# Patient Record
Sex: Female | Born: 1965 | Race: Black or African American | Hispanic: No | Marital: Single | Smoking: Former smoker
Health system: Southern US, Community
[De-identification: ages and names within clinical notes are randomized; demographics above are authoritative.]

## PROBLEM LIST (undated history)

## (undated) DIAGNOSIS — K859 Acute pancreatitis without necrosis or infection, unspecified: Secondary | ICD-10-CM

## (undated) DIAGNOSIS — I639 Cerebral infarction, unspecified: Secondary | ICD-10-CM

## (undated) DIAGNOSIS — N1831 Chronic kidney disease, stage 3a: Secondary | ICD-10-CM

## (undated) DIAGNOSIS — I10 Essential (primary) hypertension: Secondary | ICD-10-CM

## (undated) HISTORY — PX: TUBAL LIGATION: SHX77

## (undated) HISTORY — PX: CORONARY ANGIOPLASTY WITH STENT PLACEMENT: SHX49

---

## 2020-07-04 ENCOUNTER — Emergency Department (HOSPITAL_COMMUNITY)
Admission: EM | Admit: 2020-07-04 | Discharge: 2020-07-05 | Disposition: A | Discharge status: Home / Self Care | Payer: Medicaid Other | Attending: Emergency Medicine | Admitting: Emergency Medicine

## 2020-07-04 ENCOUNTER — Other Ambulatory Visit: Payer: Self-pay

## 2020-07-04 DIAGNOSIS — Z79899 Other long term (current) drug therapy: Secondary | ICD-10-CM | POA: Insufficient documentation

## 2020-07-04 DIAGNOSIS — U071 COVID-19: Secondary | ICD-10-CM | POA: Insufficient documentation

## 2020-07-04 DIAGNOSIS — Z8673 Personal history of transient ischemic attack (TIA), and cerebral infarction without residual deficits: Secondary | ICD-10-CM | POA: Insufficient documentation

## 2020-07-04 DIAGNOSIS — R531 Weakness: Secondary | ICD-10-CM

## 2020-07-04 DIAGNOSIS — I2510 Atherosclerotic heart disease of native coronary artery without angina pectoris: Secondary | ICD-10-CM | POA: Diagnosis not present

## 2020-07-04 DIAGNOSIS — Z7982 Long term (current) use of aspirin: Secondary | ICD-10-CM | POA: Diagnosis not present

## 2020-07-04 DIAGNOSIS — I10 Essential (primary) hypertension: Secondary | ICD-10-CM | POA: Diagnosis not present

## 2020-07-04 LAB — BASIC METABOLIC PANEL
Anion gap: 17 — ABNORMAL HIGH (ref 5–15)
BUN: 33 mg/dL — ABNORMAL HIGH (ref 6–20)
CO2: 27 mmol/L (ref 22–32)
Calcium: 9.4 mg/dL (ref 8.9–10.3)
Chloride: 93 mmol/L — ABNORMAL LOW (ref 98–111)
Creatinine, Ser: 1.59 mg/dL — ABNORMAL HIGH (ref 0.44–1.00)
GFR, Estimated: 38 mL/min — ABNORMAL LOW (ref >60)
Glucose, Bld: 91 mg/dL (ref 70–99)
Potassium: 3.5 mmol/L (ref 3.5–5.1)
Sodium: 137 mmol/L (ref 135–145)

## 2020-07-04 LAB — CBC
HCT: 45.4 % (ref 36.0–46.0)
Hemoglobin: 15.2 g/dL — ABNORMAL HIGH (ref 12.0–15.0)
MCH: 29.6 pg (ref 26.0–34.0)
MCHC: 33.5 g/dL (ref 30.0–36.0)
MCV: 88.3 fL (ref 80.0–100.0)
Platelets: 370 10*3/uL (ref 150–400)
RBC: 5.14 MIL/uL — ABNORMAL HIGH (ref 3.87–5.11)
RDW: 13 % (ref 11.5–15.5)
WBC: 5.7 10*3/uL (ref 4.0–10.5)
nRBC: 0 % (ref 0.0–0.2)

## 2020-07-04 NOTE — ED Triage Notes (Signed)
Pt said she had a stroke In november and she had been going through therapy and the past 2 weeks she has not been able to get therapy and now she has been weaker on that left side and more stiff. She said she was walking with her walker but now since no therapy stiff again. Able to move limbs and hold them up on left side

## 2020-07-05 ENCOUNTER — Emergency Department (HOSPITAL_COMMUNITY): Payer: Medicaid Other

## 2020-07-05 LAB — SARS CORONAVIRUS 2 (TAT 6-24 HRS): SARS Coronavirus 2: POSITIVE — AB

## 2020-07-05 MED ORDER — SODIUM CHLORIDE 0.9 % IV BOLUS
1000.0000 mL | Freq: Once | INTRAVENOUS | Status: AC
  Administered 2020-07-05: 1000 mL via INTRAVENOUS

## 2020-07-05 MED ORDER — SODIUM CHLORIDE 0.9 % IV BOLUS
500.0000 mL | Freq: Once | INTRAVENOUS | Status: AC
  Administered 2020-07-05: 500 mL via INTRAVENOUS

## 2020-07-05 NOTE — Discharge Instructions (Signed)
Your COVID test came back positive today. This is most likely causing your fatigue and weakness.   Make sure you are staying hydrated and drinking fluids.   You can take Tylenol or Ibuprofen as directed for pain. You can alternate Tylenol and Ibuprofen every 4 hours. If you take Tylenol at 1pm, then you can take Ibuprofen at 5pm. Then you can take Tylenol again at 9pm.   Return to the Emergency Dept for any difficulty breathing, vomiting/inability to keep any food or liquid downs, chest pain or any other worsening or concerning symptoms.

## 2020-07-05 NOTE — ED Notes (Signed)
Patient transported to MRI 

## 2020-07-05 NOTE — ED Notes (Signed)
Pt placed on bedpan

## 2020-07-05 NOTE — Progress Notes (Signed)
CSW called Pt via phone due to Covid + status.  Pt states that she had a stroke in 2020 but was able to walk with a walker as recently as last week but has missed 2 weeks of PT as she was visiting niece in [place] and it snowed, therefore she states that she was unable to return to [facility] for PT at [facility] where she also has a neurologist and PCP.  Per Care Everywhere Pt has several PT appointments set at [facility] as well as other care appointments.  Pt was able to reach out to sister who will allow Pt to stay with her at her apartment while she quarantines for C+ and then Pt will return to [facility] for ongoing PT.   07/05/20 1906  TOC ED Mini Assessment  TOC Time spent with patient (minutes): 90  PING Used in TOC Assessment Yes  Admission or Readmission Diverted Yes  Interventions which prevented an admission or readmission Transportation Screening  What brought you to the Emergency Department?  Stiffness  Barriers to Discharge No Barriers Identified  Barrier interventions Transportation was coordinated through [employer] of departure Other (enter comment) Agricultural engineer)

## 2020-07-05 NOTE — ED Provider Notes (Signed)
[facility] EMERGENCY DEPARTMENT Provider Note   CSN: [id number] Arrival date & time: 07/04/20  1912     History Chief Complaint  Patient presents with  . Weakness  . Fatigue    Shirley Hansen is a 55 y.o. female.  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  CAD, HTN, HLD.  Patient states that she is presented to the emergency department today for weakness that is worsened over the past 2 weeks.  She states that weakness is affecting left upper and lower extremity.  She states that she has also feeling globally weak and fatigued.  She states that she has been staying with her niece here in [place] she normally lives with family in [place] states that she has not been able to do her normal physical therapy and feels more weak and stiff than usual.  She has had regular neurology follow-up with [facility] neurology.  She has received Botox injections for her muscle stiffness in her shoulder.  She denies any new chest pain--she has chronic muscle tightness in her chest and left shoulder--denies any shortness of breath.  She denies any headache.  She denies any new cough states that she has been coughing for years.  She denies any fevers at home no chills nausea vomiting or diarrhea.     No past medical history on file.  There are no problems to display for this patient.   The histories are not reviewed yet. Please review them in the "History" navigator section and refresh this SmartLink.   OB History   No obstetric history on file.     No family history on file.     Home Medications Prior to Admission medications   Medication Sig Start Date End Date Taking? Authorizing Provider  Artificial Tear Solution (GENTEAL TEARS OP) Place 1 drop into both eyes daily as needed (dry eyes).   Yes [provider]  aspirin EC 81 MG  tablet Take 81 mg by mouth daily. Swallow whole.   Yes [provider]  atorvastatin (LIPITOR) 40 MG tablet Take 40 mg by mouth at bedtime. 06/17/20  Yes [provider]  brimonidine (ALPHAGAN) 0.2 % ophthalmic solution Place 1 drop into both eyes 2 (two) times daily. 06/26/20  Yes [provider]  diclofenac Sodium (VOLTAREN) 1 % GEL Apply 1 application topically 4 (four) times daily as needed (pain). 05/20/20  Yes [provider]  dorzolamide-timolol (COSOPT) 22.3-6.8 MG/ML ophthalmic solution Place 1 drop into both eyes 2 (two) times daily. 05/31/20  Yes [provider]  gabapentin (NEURONTIN) 300 MG capsule Take 600 mg by mouth at bedtime. 06/11/20  Yes [provider]  hydrochlorothiazide (HYDRODIURIL) 25 MG tablet Take 25 mg by mouth daily. 06/11/20  Yes [provider]  latanoprost (XALATAN) 0.005 % ophthalmic solution Place 1 drop into both eyes at bedtime. 06/17/20  Yes [provider]  lisinopril (ZESTRIL) 40 MG tablet Take 40 mg by mouth daily. 06/16/20  Yes [provider]  methocarbamol (ROBAXIN) 500 MG tablet Take 500 mg by mouth 3 (three) times daily. 05/20/20  Yes [provider]  potassium chloride SA (KLOR-CON) 20 MEQ tablet Take 20 mEq by mouth daily. 06/26/20  Yes [provider]    Allergies    Patient has no known allergies.  Review of Systems   Review of Systems  Constitutional: Positive for activity change, appetite  change and fatigue. Negative for chills and fever.  HENT: Negative for congestion.   Eyes: Negative for pain.  Respiratory: Positive for cough (chronic, somewhat worse). Negative for shortness of breath.   Cardiovascular: Positive for chest pain (chronic unchanged). Negative for leg swelling.  Gastrointestinal: Negative for abdominal pain, diarrhea, nausea and vomiting.  Genitourinary: Negative for dysuria.  Musculoskeletal: Negative for myalgias.  Skin: Negative for  rash.  Neurological: Positive for weakness. Negative for dizziness and headaches.    Physical Exam Updated Vital Signs BP (!) 120/100   Pulse (!) 109   Temp 98.4 F (36.9 C) (Oral)   Resp 16   SpO2 99%   Physical Exam Vitals and nursing note reviewed.  Constitutional:      General: She is not in acute distress.    Appearance: She is obese.     Comments: Obese chronically ill-appearing but in no acute distress 55 year old female appears older than stated age. She is laying in bed watching TV.  HENT:     Head: Normocephalic and atraumatic.     Nose: Nose normal.     Mouth/Throat:     Mouth: Mucous membranes are dry.  Eyes:     General: No scleral icterus. Cardiovascular:     Rate and Rhythm: Normal rate and regular rhythm.     Pulses: Normal pulses.     Heart sounds: Normal heart sounds.  Pulmonary:     Effort: Pulmonary effort is normal. No respiratory distress.     Breath sounds: No wheezing.  Abdominal:     Palpations: Abdomen is soft.     Tenderness: There is no abdominal tenderness. There is no guarding or rebound.  Musculoskeletal:     Cervical back: Normal range of motion.     Right lower leg: No edema.     Left lower leg: No edema.     Comments: See neuro exam for strength examination. Diffuse muscular tenderness of the left shoulder and trapezius and parathoracic musculature.  Moves all 4 extremities.  Skin:    General: Skin is warm and dry.     Capillary Refill: Capillary refill takes less than 2 seconds.  Neurological:     Mental Status: She is alert. Mental status is at baseline.     Comments: 4/5 strength in left upper and lower extremity.  Specifically with elbow flexion extension, good strength, knee flexion extension hip flexion extension.   Alert and oriented to self, place, time and event.   Speech is fluent, clear without dysarthria or dysphasia.   Strength 5/5 in upper/lower extremities  Sensation intact in upper/lower extremities    Difficulty with finger-to-nose in left upper extremity given weakness however is able to touch nose without difficulty and then extend arm and then touch and is again. Finger-to-nose normal right upper extremity.  CN I not tested  CN II grossly intact visual fields bilaterally. Did not visualize posterior eye.   CN III, IV, VI PERRLA and EOMs intact bilaterally  CN V Intact sensation to sharp and light touch to the face  CN VII facial movements symmetric  CN VIII not tested  CN IX, X no uvula deviation, symmetric rise of soft palate  CN XI 5/5 SCM and trapezius strength bilaterally  CN XII Midline tongue protrusion, symmetric L/R movements    Psychiatric:        Mood and Affect: Mood normal.        Behavior: Behavior normal.     ED Results / Procedures /  Treatments   Labs (all labs ordered are listed, but only abnormal results are displayed) Labs Reviewed  BASIC METABOLIC PANEL - Abnormal; Notable for the following components:      Result Value   Chloride 93 (*)    BUN 33 (*)    Creatinine, Ser 1.59 (*)    GFR, Estimated 38 (*)    Anion gap 17 (*)    All other components within normal limits  CBC - Abnormal; Notable for the following components:   RBC 5.14 (*)    Hemoglobin 15.2 (*)    All other components within normal limits  SARS CORONAVIRUS 2 (TAT 6-24 HRS)  URINALYSIS, ROUTINE W REFLEX MICROSCOPIC  CBG MONITORING, ED    EKG EKG Interpretation  Date/Time:  Sunday July 04 2020 19:21:25 EST Ventricular Rate:  134 PR Interval:  116 QRS Duration: 118 QT Interval:  324 QTC Calculation: 483 R Axis:   -64 Text Interpretation: Sinus tachycardia Left axis deviation Right bundle branch block Abnormal ECG No old tracing to compare Confirmed by Meridee ScoreButler, Michael [phone]([id number]) on 07/05/2020 9:55:31 AM   Radiology DG Chest 2 View  Result Date: 07/05/2020 CLINICAL DATA:  Shortness of breath and fatigue EXAM: CHEST - 2 VIEW COMPARISON:  None. FINDINGS: Indistinct densities  at the lung bases on the frontal view. No edema, effusion, or pneumothorax. Normal heart size and mediastinal contours. IMPRESSION: Mild infiltrate or atelectasis at the lung bases, lateral view and low volumes favoring atelectasis. Electronically Signed   By: Marnee SpringJonathon  Watts M.D.   On: 07/05/2020 10:26   CT Head Wo Contrast  Result Date: 07/05/2020 CLINICAL DATA:  Worsening left-sided weakness (history of CVA) also with history of nontraumatic ICH. EXAM: CT HEAD WITHOUT CONTRAST TECHNIQUE: Contiguous axial images were obtained from the base of the skull through the vertex without intravenous contrast. COMPARISON:  No pertinent prior exams available for comparison. FINDINGS: Brain: Cerebral volume is normal for age. There is a subcentimeter hypodensity within the right thalamocapsular junction with subtle peripheral curvilinear hyperdensity (for instance as seen on series 3, images 15-17). There are chronic lacunar infarcts within the left corona radiata and left pons. No demarcated cortical infarct. No extra-axial fluid collection. No evidence of intracranial mass. No midline shift. Vascular: No hyperdense vessel.  Atherosclerotic calcifications Skull: Normal. Negative for fracture or focal lesion. Sinuses/Orbits: Visualized orbits show no acute finding. No significant paranasal sinus disease at the imaged levels. These results were called by telephone at the time of interpretation on 07/05/2020 at 11:42 am to provider [facility] Natane Heward , who verbally acknowledged these results. IMPRESSION: Subcentimeter hypodensity within the right thalamocapsular junction with subtle peripheral curvilinear hyperdensity. This is favored to reflect subacute/chronic blood products at site of a subacute/chronic lacunar infarct. However, correlation with the patient's history and any available prior outside imaging is necessary. If this does not correlate with the patient's history and outside imaging, a contrast-enhanced brain MRI is  recommended for further evaluation. Chronic lacunar infarcts within the left corona radiata and left basal ganglia. Electronically Signed   By: Jackey LogeKyle  Golden DO   On: 07/05/2020 11:43    Procedures Procedures (including critical care time)  Medications Ordered in ED Medications  sodium chloride 0.9 % bolus 500 mL (has no administration in time range)  sodium chloride 0.9 % bolus 1,000 mL (1,000 mLs Intravenous New Bag/Given 07/05/20 1056)    ED Course  I have reviewed the triage vital signs and the nursing notes.  Pertinent labs & imaging results that  were available during my care of the patient were reviewed by me and considered in my medical decision making (see chart for details).  Patient is a 55 year old female chronically ill has a history of right-sided thalamic intracranial hemorrhage. [facility]. She has chronic issues with left-sided spastic paralysis. She has had Botox injections for this and also physical therapy. She has not had either of these therapies over the past several weeks as she has been stranded in [place] living with her niece.  She appears stably chronically ill today. Lab work reviewed below. Physical exam is notable for left upper and lower extremity weakness. Per documentation from [facility] this does not appear to be significantly different from how she is weak today. She gets around in wheelchair at baseline.  CT head appears consistent with described pathology from her right thalamic ICH. Discussed this read with radiology and subsequently neurology. Subcentimeter hypodensity within the right thalamocapsular junction  with subtle peripheral curvilinear hyperdensity. This is favored to  reflect subacute/chronic blood products at site of a  subacute/chronic lacunar infarct. However, correlation with the  patient's history and any available prior outside imaging is  necessary. If this does not correlate with the patient's  history and  outside imaging, a contrast-enhanced brain MRI is recommended for  further evaluation.    Chronic lacunar infarcts within the left corona radiata and left  basal ganglia.   Will obtain MRI  CBC with elevated hemoglobin consistent with dehydration mild elevation in BUN and creatinine consistent with dehydration. No other significant electrolyte derangements besides very mild hyperchloremia  Clinical Course as of 07/05/20 1522  Mon Jul 05, 2020  1004 Reviewed basic lab work from 1/13 patient's creatinine is 1.1 at the time [WF]  [id number] 55 year old female history of hemorrhagic stroke with residual left-sided weakness usually follows over in [place].  Has not had physical therapy for a few weeks and is complaining of increased stiffness on her left side difficulty with transfers, feeling generally weak and ill.  Feels dehydrated.  Labs showing elevated creatinine.  Getting chest x-ray CT head urinalysis IV fluids Covid testing.  Sounds like she has a poor living situation, staying with family.  May need TOC assistance [MB]  1353 Discussed with neurology. Recommended MRI brain. Will be reasonable for discharge if MRI negative for ACUTE stroke. [WF]  1403 Reassessed patient. She understands plan to MRI. States she understands a urine sample is needed.  She will try to produce a urine sample for us.  Additional 500 mL normal saline bag provided.  Patient is now hooked up to purewick.  [WF]    Clinical Course User Index [MB] Terrilee FilesButler, Michael C, MD [WF] Gailen ShelterFondaw, Julieth Tugman S, [place]   MDM Rules/Calculators/A&P                          Some concern for UTI causing recrudescence of old intracranial hemorrhage neuro deficits. Chest x-ray without abnormality apart from what appears to be atalectasis.   Patient care signed out to oncoming PA at 3:23 PM for follow-up on MRI and urinalysis. Social work will assess patient at bedside. They will help identify barriers to discharge I expect patient will  be able to be discharged today.   Final Clinical Impression(s) / ED Diagnoses Final diagnoses:  None    Rx / DC Orders ED Discharge Orders    None       Rahima Fleishman, Rodrigo RanWylder S, [place]  07/05/20 1523    Terrilee Files, MD 07/05/20 [phone]

## 2020-07-05 NOTE — ED Notes (Signed)
Pt returned from MRI °

## 2020-07-05 NOTE — ED Provider Notes (Signed)
Care assumed from [facility], PA-C at shift change with social consult pending.   In brief, this patient is a 55 year old female with past history of CAD, hypertension, hyperlipidemia, CVA, intracranial hemorrhage with residual left-sided deficits who presents for evaluation of generalized weakness.  She states that she has had fatigue, weakness that has been ongoing.  She does get PT at [facility] and follows with neurology.  She has had 1 Covid vaccine.  She has not noted any fevers at home, denies any abdominal pain, nausea/vomiting, dysuria. Please see note from previous providers.   Physical Exam  BP 108/86 (BP Location: Left Arm)   Pulse (!) 112   Temp 98.5 F (36.9 C) (Oral)   Resp (!) 23   SpO2 96%   Physical Exam   Well appearing Abdomen is soft, non-distended, non-tender. No rigidity, No guarding. No peritoneal signs.  ED Course/Procedures   Clinical Course as of 07/05/20 2057  Mon Jul 05, 2020  1004 Reviewed basic lab work from 1/13 patient's creatinine is 1.1 at the time [WF]  421 55 year old female history of hemorrhagic stroke with residual left-sided weakness usually follows over in [place].  Has not had physical therapy for a few weeks and is complaining of increased stiffness on her left side difficulty with transfers, feeling generally weak and ill.  Feels dehydrated.  Labs showing elevated creatinine.  Getting chest x-ray CT head urinalysis IV fluids Covid testing.  Sounds like she has a poor living situation, staying with family.  May need TOC assistance [MB]  1353 Discussed with neurology. Recommended MRI brain. Will be reasonable for discharge if MRI negative for ACUTE stroke. [WF]  1403 Reassessed patient. She understands plan to MRI. States she understands a urine sample is needed.  She will try to produce a urine sample for [name].  Additional 500 mL normal saline bag provided.  Patient is now hooked up to purewick.  [WF]    Clinical Course User Index [MB] Shirley Files, MD [WF] Shirley Hansen, [place]    Procedures  MDM   PLAN: Follow up on MRI. If normal, social work has been consulted for dispo.   MDM:  MRI shows No acute intracranial hemorrhage or infarct. She she as remote insults involving the left corona radiata and pons.   Patient is Covid positive which is likely contributing to her symptoms.  At this time, she is not hypoxic and does not require any oxygen.  No indication that she needs admission at this time.  Reevaluation.  Patient resting comfortably.  No signs of distress.  Denies any dysuria, hematuria.  She denies any abdominal pain.  I discussed with social work who was consulted on the patient.  They will plan for patient to be transferred to her sister's house in [place]. Patient is agreeable to this. At this time, patient exhibits no emergent life-threatening condition that require further evaluation in ED. Patient had ample opportunity for questions and discussion. All patient's questions were answered with full understanding. Strict return precautions discussed. Patient expresses understanding and agreement to plan.    1. Generalized weakness   2. COVID-19      Shirley Hansen was evaluated in Emergency Department on 07/05/2020 for the symptoms described in the history of present illness. She was evaluated in the context of the global COVID-19 pandemic, which necessitated consideration that the patient might be at risk for infection with the SARS-CoV-2 virus that causes COVID-19. Institutional protocols and algorithms that pertain to the evaluation of  patients at risk for COVID-19 are in a state of rapid change based on information released by regulatory bodies including the CDC and federal and state organizations. These policies and algorithms were followed during the patient's care in the ED.    Shirley Caul, PA-C 07/05/20 2057    Shirley Loll, MD 07/06/20 [phone]

## 2020-07-14 ENCOUNTER — Emergency Department (HOSPITAL_COMMUNITY): Payer: Medicaid Other

## 2020-07-14 ENCOUNTER — Inpatient Hospital Stay (HOSPITAL_COMMUNITY): Payer: Medicaid Other

## 2020-07-14 ENCOUNTER — Other Ambulatory Visit: Payer: Self-pay

## 2020-07-14 ENCOUNTER — Other Ambulatory Visit (HOSPITAL_COMMUNITY): Payer: Medicaid Other

## 2020-07-14 ENCOUNTER — Encounter (HOSPITAL_COMMUNITY): Payer: Self-pay | Admitting: *Deleted

## 2020-07-14 ENCOUNTER — Inpatient Hospital Stay (HOSPITAL_COMMUNITY)
Admission: EM | Admit: 2020-07-14 | Discharge: 2020-07-28 | DRG: 178 | Disposition: A | Discharge status: Skilled Nursing Facility | Payer: Medicaid Other | Attending: Internal Medicine | Admitting: Internal Medicine

## 2020-07-14 DIAGNOSIS — I129 Hypertensive chronic kidney disease with stage 1 through stage 4 chronic kidney disease, or unspecified chronic kidney disease: Secondary | ICD-10-CM | POA: Diagnosis present

## 2020-07-14 DIAGNOSIS — Z23 Encounter for immunization: Secondary | ICD-10-CM | POA: Diagnosis not present

## 2020-07-14 DIAGNOSIS — D72829 Elevated white blood cell count, unspecified: Secondary | ICD-10-CM | POA: Diagnosis not present

## 2020-07-14 DIAGNOSIS — I69354 Hemiplegia and hemiparesis following cerebral infarction affecting left non-dominant side: Secondary | ICD-10-CM

## 2020-07-14 DIAGNOSIS — I252 Old myocardial infarction: Secondary | ICD-10-CM

## 2020-07-14 DIAGNOSIS — N1831 Chronic kidney disease, stage 3a: Secondary | ICD-10-CM | POA: Diagnosis present

## 2020-07-14 DIAGNOSIS — I2510 Atherosclerotic heart disease of native coronary artery without angina pectoris: Secondary | ICD-10-CM | POA: Diagnosis present

## 2020-07-14 DIAGNOSIS — E669 Obesity, unspecified: Secondary | ICD-10-CM | POA: Diagnosis present

## 2020-07-14 DIAGNOSIS — N189 Chronic kidney disease, unspecified: Secondary | ICD-10-CM | POA: Diagnosis not present

## 2020-07-14 DIAGNOSIS — E860 Dehydration: Secondary | ICD-10-CM | POA: Diagnosis present

## 2020-07-14 DIAGNOSIS — R609 Edema, unspecified: Secondary | ICD-10-CM | POA: Diagnosis not present

## 2020-07-14 DIAGNOSIS — I639 Cerebral infarction, unspecified: Secondary | ICD-10-CM | POA: Diagnosis present

## 2020-07-14 DIAGNOSIS — R7401 Elevation of levels of liver transaminase levels: Secondary | ICD-10-CM | POA: Diagnosis present

## 2020-07-14 DIAGNOSIS — M109 Gout, unspecified: Secondary | ICD-10-CM | POA: Diagnosis present

## 2020-07-14 DIAGNOSIS — N179 Acute kidney failure, unspecified: Secondary | ICD-10-CM | POA: Diagnosis present

## 2020-07-14 DIAGNOSIS — R0602 Shortness of breath: Secondary | ICD-10-CM

## 2020-07-14 DIAGNOSIS — U071 COVID-19: Secondary | ICD-10-CM | POA: Diagnosis not present

## 2020-07-14 DIAGNOSIS — I959 Hypotension, unspecified: Secondary | ICD-10-CM | POA: Diagnosis present

## 2020-07-14 DIAGNOSIS — Z87891 Personal history of nicotine dependence: Secondary | ICD-10-CM | POA: Diagnosis not present

## 2020-07-14 DIAGNOSIS — E785 Hyperlipidemia, unspecified: Secondary | ICD-10-CM | POA: Diagnosis present

## 2020-07-14 DIAGNOSIS — T380X5A Adverse effect of glucocorticoids and synthetic analogues, initial encounter: Secondary | ICD-10-CM | POA: Diagnosis not present

## 2020-07-14 DIAGNOSIS — Z8673 Personal history of transient ischemic attack (TIA), and cerebral infarction without residual deficits: Secondary | ICD-10-CM | POA: Diagnosis not present

## 2020-07-14 DIAGNOSIS — R21 Rash and other nonspecific skin eruption: Secondary | ICD-10-CM | POA: Diagnosis not present

## 2020-07-14 DIAGNOSIS — I10 Essential (primary) hypertension: Secondary | ICD-10-CM | POA: Diagnosis present

## 2020-07-14 DIAGNOSIS — Z6833 Body mass index (BMI) 33.0-33.9, adult: Secondary | ICD-10-CM

## 2020-07-14 DIAGNOSIS — M79673 Pain in unspecified foot: Secondary | ICD-10-CM

## 2020-07-14 DIAGNOSIS — L89029 Pressure ulcer of left elbow, unspecified stage: Secondary | ICD-10-CM | POA: Diagnosis present

## 2020-07-14 DIAGNOSIS — R7989 Other specified abnormal findings of blood chemistry: Secondary | ICD-10-CM | POA: Diagnosis present

## 2020-07-14 DIAGNOSIS — M79671 Pain in right foot: Secondary | ICD-10-CM | POA: Diagnosis not present

## 2020-07-14 DIAGNOSIS — N390 Urinary tract infection, site not specified: Secondary | ICD-10-CM | POA: Diagnosis present

## 2020-07-14 DIAGNOSIS — M79672 Pain in left foot: Secondary | ICD-10-CM | POA: Diagnosis not present

## 2020-07-14 HISTORY — DX: Acute pancreatitis without necrosis or infection, unspecified: K85.90

## 2020-07-14 HISTORY — DX: Essential (primary) hypertension: I10

## 2020-07-14 HISTORY — DX: Chronic kidney disease, stage 3a: N18.31

## 2020-07-14 HISTORY — DX: Cerebral infarction, unspecified: I63.9

## 2020-07-14 LAB — URINALYSIS, ROUTINE W REFLEX MICROSCOPIC
Glucose, UA: NEGATIVE mg/dL
Hgb urine dipstick: NEGATIVE
Ketones, ur: NEGATIVE mg/dL
Nitrite: NEGATIVE
Protein, ur: 30 mg/dL — AB
Specific Gravity, Urine: 1.023 (ref 1.005–1.030)
pH: 5 (ref 5.0–8.0)

## 2020-07-14 LAB — CBC
HCT: 43.6 % (ref 36.0–46.0)
Hemoglobin: 13.9 g/dL (ref 12.0–15.0)
MCH: 28.5 pg (ref 26.0–34.0)
MCHC: 31.9 g/dL (ref 30.0–36.0)
MCV: 89.5 fL (ref 80.0–100.0)
Platelets: 533 10*3/uL — ABNORMAL HIGH (ref 150–400)
RBC: 4.87 MIL/uL (ref 3.87–5.11)
RDW: 13 % (ref 11.5–15.5)
WBC: 11.1 10*3/uL — ABNORMAL HIGH (ref 4.0–10.5)
nRBC: 0 % (ref 0.0–0.2)

## 2020-07-14 LAB — FERRITIN: Ferritin: 643 ng/mL — ABNORMAL HIGH (ref 11–307)

## 2020-07-14 LAB — BASIC METABOLIC PANEL
Anion gap: 16 — ABNORMAL HIGH (ref 5–15)
BUN: 60 mg/dL — ABNORMAL HIGH (ref 6–20)
CO2: 26 mmol/L (ref 22–32)
Calcium: 9.9 mg/dL (ref 8.9–10.3)
Chloride: 94 mmol/L — ABNORMAL LOW (ref 98–111)
Creatinine, Ser: 4.8 mg/dL — ABNORMAL HIGH (ref 0.44–1.00)
GFR, Estimated: 10 mL/min — ABNORMAL LOW (ref >60)
Glucose, Bld: 118 mg/dL — ABNORMAL HIGH (ref 70–99)
Potassium: 3.8 mmol/L (ref 3.5–5.1)
Sodium: 136 mmol/L (ref 135–145)

## 2020-07-14 LAB — HEPARIN LEVEL (UNFRACTIONATED): Heparin Unfractionated: 0.82 IU/mL — ABNORMAL HIGH (ref 0.30–0.70)

## 2020-07-14 LAB — D-DIMER, QUANTITATIVE: D-Dimer, Quant: 8.92 ug/mL-FEU — ABNORMAL HIGH (ref 0.00–0.50)

## 2020-07-14 LAB — LACTIC ACID, PLASMA: Lactic Acid, Venous: 1.4 mmol/L (ref 0.5–1.9)

## 2020-07-14 LAB — I-STAT BETA HCG BLOOD, ED (MC, WL, AP ONLY): I-stat hCG, quantitative: 17.9 m[IU]/mL — ABNORMAL HIGH (ref <5)

## 2020-07-14 LAB — POC OCCULT BLOOD, ED: Fecal Occult Bld: NEGATIVE

## 2020-07-14 LAB — HIV ANTIBODY (ROUTINE TESTING W REFLEX): HIV Screen 4th Generation wRfx: NONREACTIVE

## 2020-07-14 LAB — PROCALCITONIN: Procalcitonin: 0.18 ng/mL

## 2020-07-14 LAB — C-REACTIVE PROTEIN: CRP: 4.1 mg/dL — ABNORMAL HIGH (ref <1.0)

## 2020-07-14 LAB — FIBRINOGEN: Fibrinogen: 452 mg/dL (ref 210–475)

## 2020-07-14 MED ORDER — OXYCODONE HCL 5 MG PO TABS
5.0000 mg | ORAL_TABLET | ORAL | Status: DC | PRN
  Administered 2020-07-14 – 2020-07-19 (×12): 5 mg via ORAL
  Filled 2020-07-14 (×12): qty 1

## 2020-07-14 MED ORDER — BRIMONIDINE TARTRATE 0.2 % OP SOLN
1.0000 [drp] | Freq: Two times a day (BID) | OPHTHALMIC | Status: DC
  Administered 2020-07-14 – 2020-07-28 (×29): 1 [drp] via OPHTHALMIC
  Filled 2020-07-14 (×2): qty 5

## 2020-07-14 MED ORDER — CAMPHOR-MENTHOL 0.5-0.5 % EX LOTN
1.0000 "application " | TOPICAL_LOTION | Freq: Three times a day (TID) | CUTANEOUS | Status: DC | PRN
  Filled 2020-07-14: qty 222

## 2020-07-14 MED ORDER — DORZOLAMIDE HCL-TIMOLOL MAL 2-0.5 % OP SOLN
1.0000 [drp] | Freq: Two times a day (BID) | OPHTHALMIC | Status: DC
  Administered 2020-07-14 – 2020-07-28 (×29): 1 [drp] via OPHTHALMIC
  Filled 2020-07-14 (×2): qty 10

## 2020-07-14 MED ORDER — ONDANSETRON HCL 4 MG/2ML IJ SOLN: 4.0000 mg | Freq: Four times a day (QID) | INTRAMUSCULAR | Status: DC | PRN

## 2020-07-14 MED ORDER — HYDROXYZINE HCL 25 MG PO TABS
25.0000 mg | ORAL_TABLET | Freq: Three times a day (TID) | ORAL | Status: DC | PRN
  Administered 2020-07-16: 25 mg via ORAL
  Filled 2020-07-14: qty 1

## 2020-07-14 MED ORDER — SODIUM CHLORIDE 0.9 % IV BOLUS
1000.0000 mL | Freq: Once | INTRAVENOUS | Status: AC
  Administered 2020-07-14: 1000 mL via INTRAVENOUS

## 2020-07-14 MED ORDER — ZOLPIDEM TARTRATE 5 MG PO TABS
5.0000 mg | ORAL_TABLET | Freq: Every evening | ORAL | Status: DC | PRN
  Administered 2020-07-18 – 2020-07-27 (×10): 5 mg via ORAL
  Filled 2020-07-14 (×10): qty 1

## 2020-07-14 MED ORDER — SODIUM CHLORIDE 0.9% FLUSH
3.0000 mL | Freq: Two times a day (BID) | INTRAVENOUS | Status: DC
  Administered 2020-07-14 – 2020-07-15 (×2): 3 mL via INTRAVENOUS

## 2020-07-14 MED ORDER — ASPIRIN EC 81 MG PO TBEC
81.0000 mg | DELAYED_RELEASE_TABLET | Freq: Every day | ORAL | Status: DC
  Administered 2020-07-14 – 2020-07-28 (×15): 81 mg via ORAL
  Filled 2020-07-14 (×15): qty 1

## 2020-07-14 MED ORDER — SODIUM CHLORIDE 0.9 % IV BOLUS
500.0000 mL | Freq: Once | INTRAVENOUS | Status: AC
  Administered 2020-07-14: 500 mL via INTRAVENOUS

## 2020-07-14 MED ORDER — SODIUM CHLORIDE 0.9 % IV SOLN: 100.0000 mg | Freq: Every day | INTRAVENOUS | Status: DC

## 2020-07-14 MED ORDER — ZINC SULFATE 220 (50 ZN) MG PO CAPS
220.0000 mg | ORAL_CAPSULE | Freq: Every day | ORAL | Status: DC
  Administered 2020-07-14 – 2020-07-28 (×15): 220 mg via ORAL
  Filled 2020-07-14 (×15): qty 1

## 2020-07-14 MED ORDER — HEPARIN (PORCINE) 25000 UT/250ML-% IV SOLN
750.0000 [IU]/h | INTRAVENOUS | Status: DC
  Administered 2020-07-14: 1000 [IU]/h via INTRAVENOUS
  Filled 2020-07-14 (×5): qty 250

## 2020-07-14 MED ORDER — POLYETHYLENE GLYCOL 3350 17 G PO PACK
17.0000 g | PACK | Freq: Every day | ORAL | Status: DC | PRN
  Administered 2020-07-19: 17 g via ORAL
  Filled 2020-07-14: qty 1

## 2020-07-14 MED ORDER — DIAZEPAM 5 MG/ML IJ SOLN: 5.0000 mg | Freq: Once | INTRAMUSCULAR | Status: DC

## 2020-07-14 MED ORDER — HYDROCOD POLST-CPM POLST ER 10-8 MG/5ML PO SUER: 5.0000 mL | Freq: Two times a day (BID) | ORAL | Status: DC | PRN

## 2020-07-14 MED ORDER — SODIUM CHLORIDE 0.9 % IV SOLN
100.0000 mg | Freq: Every day | INTRAVENOUS | Status: AC
  Administered 2020-07-15 – 2020-07-18 (×4): 100 mg via INTRAVENOUS
  Filled 2020-07-14 (×5): qty 20

## 2020-07-14 MED ORDER — HEPARIN BOLUS VIA INFUSION
4000.0000 [IU] | Freq: Once | INTRAVENOUS | Status: AC
  Administered 2020-07-14: 4000 [IU] via INTRAVENOUS
  Filled 2020-07-14: qty 4000

## 2020-07-14 MED ORDER — SODIUM CHLORIDE 0.9 % IV SOLN: Freq: Once | INTRAVENOUS | Status: AC

## 2020-07-14 MED ORDER — PREDNISONE 5 MG PO TABS: 50.0000 mg | ORAL_TABLET | Freq: Every day | ORAL | Status: DC

## 2020-07-14 MED ORDER — METHYLPREDNISOLONE SODIUM SUCC 40 MG IJ SOLR
0.5000 mg/kg | Freq: Two times a day (BID) | INTRAMUSCULAR | Status: DC
Start: 2020-07-14 — End: 2020-07-15
  Administered 2020-07-14 – 2020-07-15 (×2): 39.6 mg via INTRAVENOUS
  Filled 2020-07-14 (×2): qty 1

## 2020-07-14 MED ORDER — ONDANSETRON HCL 4 MG PO TABS
4.0000 mg | ORAL_TABLET | Freq: Four times a day (QID) | ORAL | Status: DC | PRN
Start: 2020-07-14 — End: 2020-07-15

## 2020-07-14 MED ORDER — ACETAMINOPHEN 325 MG PO TABS
650.0000 mg | ORAL_TABLET | Freq: Four times a day (QID) | ORAL | Status: DC | PRN
  Administered 2020-07-14 – 2020-07-28 (×19): 650 mg via ORAL
  Filled 2020-07-14 (×20): qty 2

## 2020-07-14 MED ORDER — CALCIUM CARBONATE ANTACID 1250 MG/5ML PO SUSP
500.0000 mg | Freq: Four times a day (QID) | ORAL | Status: DC | PRN
  Filled 2020-07-14: qty 5

## 2020-07-14 MED ORDER — SODIUM CHLORIDE 0.9 % IV SOLN: 250.0000 mL | INTRAVENOUS | Status: DC | PRN

## 2020-07-14 MED ORDER — ALBUTEROL SULFATE HFA 108 (90 BASE) MCG/ACT IN AERS
2.0000 | INHALATION_SPRAY | RESPIRATORY_TRACT | Status: DC | PRN
  Filled 2020-07-14: qty 6.7

## 2020-07-14 MED ORDER — METHOCARBAMOL 500 MG PO TABS
750.0000 mg | ORAL_TABLET | Freq: Once | ORAL | Status: AC
  Administered 2020-07-14: 750 mg via ORAL
  Filled 2020-07-14: qty 2

## 2020-07-14 MED ORDER — LACTATED RINGERS IV SOLN: INTRAVENOUS | Status: DC

## 2020-07-14 MED ORDER — NEPRO/CARBSTEADY PO LIQD
237.0000 mL | Freq: Three times a day (TID) | ORAL | Status: DC | PRN
  Filled 2020-07-14: qty 237

## 2020-07-14 MED ORDER — DOCUSATE SODIUM 283 MG RE ENEM
1.0000 | ENEMA | RECTAL | Status: DC | PRN
  Filled 2020-07-14: qty 1

## 2020-07-14 MED ORDER — GUAIFENESIN-DM 100-10 MG/5ML PO SYRP: 10.0000 mL | ORAL_SOLUTION | ORAL | Status: DC | PRN

## 2020-07-14 MED ORDER — SODIUM CHLORIDE 0.9 % IV SOLN: 200.0000 mg | Freq: Once | INTRAVENOUS | Status: DC

## 2020-07-14 MED ORDER — LATANOPROST 0.005 % OP SOLN
1.0000 [drp] | Freq: Every day | OPHTHALMIC | Status: DC
  Administered 2020-07-14 – 2020-07-27 (×14): 1 [drp] via OPHTHALMIC
  Filled 2020-07-14 (×2): qty 2.5

## 2020-07-14 MED ORDER — ASCORBIC ACID 500 MG PO TABS
500.0000 mg | ORAL_TABLET | Freq: Every day | ORAL | Status: DC
  Administered 2020-07-14 – 2020-07-28 (×15): 500 mg via ORAL
  Filled 2020-07-14 (×15): qty 1

## 2020-07-14 MED ORDER — ACETAMINOPHEN 650 MG RE SUPP: 650.0000 mg | Freq: Four times a day (QID) | RECTAL | Status: DC | PRN

## 2020-07-14 MED ORDER — SODIUM CHLORIDE 0.9% FLUSH: 3.0000 mL | INTRAVENOUS | Status: DC | PRN

## 2020-07-14 MED ORDER — SORBITOL 70 % SOLN
30.0000 mL | Status: DC | PRN
  Filled 2020-07-14: qty 30

## 2020-07-14 MED ORDER — ATORVASTATIN CALCIUM 40 MG PO TABS
40.0000 mg | ORAL_TABLET | Freq: Every day | ORAL | Status: DC
  Administered 2020-07-14 – 2020-07-27 (×14): 40 mg via ORAL
  Filled 2020-07-14 (×14): qty 1

## 2020-07-14 MED ORDER — BISACODYL 5 MG PO TBEC: 5.0000 mg | DELAYED_RELEASE_TABLET | Freq: Every day | ORAL | Status: DC | PRN

## 2020-07-14 MED ORDER — SODIUM CHLORIDE 0.9 % IV SOLN
200.0000 mg | Freq: Once | INTRAVENOUS | Status: AC
  Administered 2020-07-14: 200 mg via INTRAVENOUS
  Filled 2020-07-14: qty 200

## 2020-07-14 NOTE — Progress Notes (Addendum)
ANTICOAGULATION CONSULT NOTE - Follow Up Consult  Pharmacy Consult for heparin Indication: rule out pulmonary embolus in setting of COVID infection and elevated d-dimer  No Known Allergies  Patient Measurements: Height: 5\' 2"  (157.5 cm) Weight: 79.4 kg (175 lb) IBW/kg (Calculated) : 50.1 Heparin Dosing Weight: 67.7  Vital Signs: Temp: 97.8 F (36.6 C) (02/02 1144) Temp Source: Oral (02/02 1144) BP: 91/71 (02/02 1345) Pulse Rate: 116 (02/02 1345)  Labs: Recent Labs    07/14/20 0701  HGB 13.9  HCT 43.6  PLT 533*  CREATININE 4.80*    Estimated Creatinine Clearance: 13.1 mL/min (A) (by C-G formula based on SCr of 4.8 mg/dL (H)).   Assessment: 55 yo admitted with weakness, blood pressure 80-90's on arrival.  Previous COVID infection diagnosed 07/05/20.  CXR unremarkable.  D-dimer elevated at 8.92 and VQ scan pending.  Patient does have elevated creatinine (today 4.8 while previously 1.53 in January 2022). Pharmacy consulted to dose heparin empirically while ruling out PE due to patient being high risk with recent COVID infection and elevated d-dimer.    Goal of Therapy:  Heparin level 0.3-0.7 units/ml Monitor platelets by anticoagulation protocol: Yes   Plan:  Give 4000 units bolus x 1, followed by Heparin 1000 units/hr Check 8 hour HL due to CrCl < 12mL/min Follow up daily CBC, s/sx bleeding, VQ scan results  31m, PharmD PGY-1 Acute Care Pharmacy Resident Office: [phone] 07/14/2020 2:01 PM

## 2020-07-14 NOTE — ED Provider Notes (Signed)
[facility]   CSN: [id number] Arrival date & time: 07/14/20  0636     History Chief Complaint  Patient presents with  . Back Pain  . Weakness    Covid     Shirley AdamDetra Dillenburg is a 55 y.o. female.  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  CAD, HTN, HLD.  Notably I saw patient 10 days ago in ER and diagnosed patient with Covid at that time.  She had had gradual weakening and decompensation over the past 2 weeks 53 generalized weakness related to patient not being compliant with her physical therapy or Botox injections via her neurologist at [facility]--this is because patient has been stranded in [place] for social reasons.  Patient finally complaining of left shoulder spasms.  She states that these have been worse for 1 week however on further questioning she states that this is been ongoing for greater than 3 weeks.  She describes a constant severe.  States that she is having difficulty sleeping because of the pain/contractions in her arm/shoulder.  She denies any fevers, chills, cough, urinary frequency, urgency,.  No hematuria.  No diarrhea. He does however state that "I am irritated down there" referencing the area between her legs given that she states that she has been sitting in her urine and her diaper because of poor help at home.  She states that she has not been drinking much water because she feels it taste change.      Past Medical History:  Diagnosis Date  . Hypertension   . Pancreatitis   . Renal disorder   . Stroke [facility](HCC)     Patient Active Problem List   Diagnosis Date Noted  . COVID-19 07/14/2020     The histories are not reviewed yet. Please review them in the "History" navigator section and refresh this SmartLink.   OB History   No obstetric history on file.     No family  history on file.     Home Medications Prior to Admission medications   Medication Sig Start Date End Date Taking? Authorizing Provider  Artificial Tear Solution (GENTEAL TEARS OP) Place 1 drop into both eyes daily as needed (dry eyes).   Yes [provider]  aspirin EC 81 MG tablet Take 81 mg by mouth daily. Swallow whole.   Yes [provider]  atorvastatin (LIPITOR) 40 MG tablet Take 40 mg by mouth at bedtime. 06/17/20  Yes [provider]  brimonidine (ALPHAGAN) 0.2 % ophthalmic solution Place 1 drop into both eyes 2 (two) times daily. 06/26/20  Yes [provider]  diclofenac Sodium (VOLTAREN) 1 % GEL Apply 1 application topically 4 (four) times daily as needed (pain). 05/20/20  Yes [provider]  dorzolamide-timolol (COSOPT) 22.3-6.8 MG/ML ophthalmic solution Place 1 drop into both eyes 2 (two) times daily. 05/31/20  Yes [provider]  gabapentin (NEURONTIN) 300 MG capsule Take 600 mg by mouth at bedtime. 06/11/20  Yes [provider]  hydrochlorothiazide (HYDRODIURIL) 25 MG tablet Take 25 mg by mouth daily. 06/11/20  Yes [provider]  latanoprost (XALATAN) 0.005 % ophthalmic solution Place 1 drop into both eyes at bedtime. 06/17/20  Yes [provider]  lisinopril (ZESTRIL) 40 MG tablet Take 40 mg by mouth daily. 06/16/20  Yes [provider]  methocarbamol (ROBAXIN) 500 MG tablet Take 500 mg by mouth  3 (three) times daily. 05/20/20  Yes [provider]  potassium chloride SA (KLOR-CON) 20 MEQ tablet Take 20 mEq by mouth daily. 06/26/20  Yes [provider]    Allergies    Patient has no known allergies.  Review of Systems   Review of Systems  Constitutional: Positive for fatigue. Negative for chills and fever.  HENT: Negative for congestion.   Eyes: Negative for pain.  Respiratory: Negative for cough and shortness of breath.   Cardiovascular: Negative for chest pain and leg  swelling.  Gastrointestinal: Negative for abdominal pain and vomiting.  Genitourinary: Negative for dysuria.  Musculoskeletal: Negative for myalgias.       Left shoulder and neck pain  Skin: Negative for rash.  Neurological: Negative for dizziness and headaches.    Physical Exam Updated Vital Signs BP 114/88 (BP Location: Right Arm)   Pulse (!) 121   Temp 98.8 F (37.1 C) (Oral)   Resp 15   Ht 5\' 2"  (1.575 m)   Wt 79.4 kg   SpO2 96%   BMI 32.01 kg/m   Physical Exam Vitals and nursing note reviewed.  Constitutional:      General: She is not in acute distress.    Appearance: She is obese.  HENT:     Head: Normocephalic and atraumatic.     Nose: Nose normal.     Mouth/Throat:     Mouth: Mucous membranes are dry.  Eyes:     General: No scleral icterus. Cardiovascular:     Rate and Rhythm: Normal rate and regular rhythm.     Pulses: Normal pulses.     Heart sounds: Normal heart sounds.  Pulmonary:     Effort: Pulmonary effort is normal. No respiratory distress.     Breath sounds: Normal breath sounds. No wheezing or rales.  Abdominal:     Palpations: Abdomen is soft.     Tenderness: There is no abdominal tenderness. There is no right CVA tenderness, left CVA tenderness, guarding or rebound.  Musculoskeletal:     Cervical back: Normal range of motion.     Right lower leg: No edema.     Left lower leg: No edema.     Comments: Some left trapezius and deltoid TTP Consistent left upper and left lower extremity weakness 4/5  Skin:    General: Skin is warm and dry.     Capillary Refill: Capillary refill takes less than 2 seconds.  Neurological:     Mental Status: She is alert. Mental status is at baseline.  Psychiatric:        Mood and Affect: Mood normal.        Behavior: Behavior normal.     ED Results / Procedures / Treatments   Labs (all labs ordered are listed, but only abnormal results are displayed) Labs Reviewed  BASIC METABOLIC PANEL - Abnormal; Notable  for the following components:      Result Value   Chloride 94 (*)    Glucose, Bld 118 (*)    BUN 60 (*)    Creatinine, Ser 4.80 (*)    GFR, Estimated 10 (*)    Anion gap 16 (*)    All other components within normal limits  CBC - Abnormal; Notable for the following components:   WBC 11.1 (*)    Platelets 533 (*)    All other components within normal limits  URINALYSIS, ROUTINE W REFLEX MICROSCOPIC - Abnormal; Notable for the following components:   Color, Urine AMBER (*)  APPearance CLOUDY (*)    Bilirubin Urine SMALL (*)    Protein, ur 30 (*)    Leukocytes,Ua SMALL (*)    Bacteria, UA FEW (*)    All other components within normal limits  D-DIMER, QUANTITATIVE (NOT AT [facility]) - Abnormal; Notable for the following components:   D-Dimer, Quant 8.92 (*)    All other components within normal limits  FERRITIN - Abnormal; Notable for the following components:   Ferritin 643 (*)    All other components within normal limits  C-REACTIVE PROTEIN - Abnormal; Notable for the following components:   CRP 4.1 (*)    All other components within normal limits  I-STAT BETA HCG BLOOD, ED ([facility], [facility], [facility] ONLY) - Abnormal; Notable for the following components:   I-stat hCG, quantitative 17.9 (*)    All other components within normal limits  CULTURE, BLOOD (ROUTINE X 2)  CULTURE, BLOOD (ROUTINE X 2)  URINE CULTURE  LACTIC ACID, PLASMA  FIBRINOGEN  PROCALCITONIN  HEPARIN LEVEL (UNFRACTIONATED)  HIV ANTIBODY (ROUTINE TESTING W REFLEX)  CBC WITH DIFFERENTIAL/PLATELET  COMPREHENSIVE METABOLIC PANEL  C-REACTIVE PROTEIN  D-DIMER, QUANTITATIVE (NOT AT [facility])  FERRITIN  MAGNESIUM  PHOSPHORUS  POC OCCULT BLOOD, ED    EKG EKG Interpretation  Date/Time:  Wednesday July 14 2020 06:52:10 EST Ventricular Rate:  115 PR Interval:  118 QRS Duration: 122 QT Interval:  354 QTC Calculation: 489 R Axis:   -10 Text Interpretation: Sinus tachycardia Right bundle branch block Abnormal ECG Confirmed by  Virgina Norfolk [phone]) on 07/14/2020 10:08:50 AM   Radiology DG Chest Portable 1 View  Result Date: 07/14/2020 CLINICAL DATA:  Dyspnea, COVID pneumonia EXAM: PORTABLE CHEST 1 VIEW COMPARISON:  07/05/2020 FINDINGS: The heart size and mediastinal contours are within normal limits. Both lungs are clear. The visualized skeletal structures are unremarkable. IMPRESSION: No active disease. Electronically Signed   By: Helyn Numbers MD   On: 07/14/2020 10:43    Procedures .Critical Care Performed by: Gailen Shelter, PA Authorized by: Gailen Shelter, PA   Critical care provider statement:    Critical care time (minutes):  45   Critical care time was exclusive of:  Separately billable procedures and treating other patients and teaching time   Critical care was necessary to treat or prevent imminent or life-threatening deterioration of the following conditions: Persistent borderline hypotension.   Critical care was time spent personally by me on the following activities:  Discussions with consultants, evaluation of patient's response to treatment, examination of patient, ordering and performing treatments and interventions, ordering and review of laboratory studies, ordering and review of radiographic studies, pulse oximetry, re-evaluation of patient's condition, obtaining history from patient or surrogate and review of old charts   I assumed direction of critical care for this patient from another provider in my specialty: no      Medications Ordered in ED Medications  heparin bolus via infusion 4,000 Units (has no administration in time range)  heparin ADULT infusion 100 units/mL (25000 units/220mL) (has no administration in time range)  diazepam (VALIUM) injection 5 mg (5 mg Intravenous Not Given 07/14/20 1654)  aspirin EC tablet 81 mg (81 mg Oral Given 07/14/20 1606)  atorvastatin (LIPITOR) tablet 40 mg (has no administration in time range)  brimonidine (ALPHAGAN) 0.2 % ophthalmic solution 1 drop  (1 drop Both Eyes Not Given 07/14/20 1654)  dorzolamide-timolol (COSOPT) 22.3-6.8 MG/ML ophthalmic solution 1 drop (has no administration in time range)  latanoprost (XALATAN) 0.005 % ophthalmic solution 1  drop (has no administration in time range)  acetaminophen (TYLENOL) tablet 650 mg (has no administration in time range)    Or  acetaminophen (TYLENOL) suppository 650 mg (has no administration in time range)  zolpidem (AMBIEN) tablet 5 mg (has no administration in time range)  sorbitol 70 % solution 30 mL (has no administration in time range)  docusate sodium (ENEMEEZ) enema 283 mg (has no administration in time range)  ondansetron (ZOFRAN) tablet 4 mg (has no administration in time range)    Or  ondansetron (ZOFRAN) injection 4 mg (has no administration in time range)  camphor-menthol (SARNA) lotion 1 application (has no administration in time range)    And  hydrOXYzine (ATARAX/VISTARIL) tablet 25 mg (has no administration in time range)  calcium carbonate (dosed in mg elemental calcium) suspension 500 mg of elemental calcium (has no administration in time range)  feeding supplement (NEPRO CARB STEADY) liquid 237 mL (has no administration in time range)  sodium chloride flush (NS) 0.9 % injection 3 mL (3 mLs Intravenous Given 07/14/20 1655)  sodium chloride flush (NS) 0.9 % injection 3 mL (3 mLs Intravenous Given 07/14/20 1655)  sodium chloride flush (NS) 0.9 % injection 3 mL (has no administration in time range)  0.9 %  sodium chloride infusion (has no administration in time range)  albuterol (VENTOLIN HFA) 108 (90 Base) MCG/ACT inhaler 2 puff (has no administration in time range)  methylPREDNISolone sodium succinate (SOLU-MEDROL) 40 mg/mL injection 39.6 mg (39.6 mg Intravenous Given 07/14/20 1606)    Followed by  predniSONE (DELTASONE) tablet 50 mg (has no administration in time range)  guaiFENesin-dextromethorphan (ROBITUSSIN DM) 100-10 MG/5ML syrup 10 mL (has no administration in time range)   chlorpheniramine-HYDROcodone (TUSSIONEX) 10-8 MG/5ML suspension 5 mL (has no administration in time range)  ascorbic acid (VITAMIN C) tablet 500 mg (500 mg Oral Given 07/14/20 1606)  zinc sulfate capsule 220 mg (220 mg Oral Given 07/14/20 1655)  oxyCODONE (Oxy IR/ROXICODONE) immediate release tablet 5 mg (has no administration in time range)  polyethylene glycol (MIRALAX / GLYCOLAX) packet 17 g (has no administration in time range)  bisacodyl (DULCOLAX) EC tablet 5 mg (has no administration in time range)  lactated ringers infusion ( Intravenous New Bag/Given 07/14/20 1623)  remdesivir 200 mg in sodium chloride 0.9% 250 mL IVPB (200 mg Intravenous New Bag/Given 07/14/20 1709)    Followed by  remdesivir 100 mg in sodium chloride 0.9 % 100 mL IVPB (has no administration in time range)  sodium chloride 0.9 % bolus 1,000 mL (0 mLs Intravenous Stopped 07/14/20 1219)  methocarbamol (ROBAXIN) tablet 750 mg (750 mg Oral Given 07/14/20 1147)  sodium chloride 0.9 % bolus 1,000 mL (0 mLs Intravenous Stopped 07/14/20 1318)  sodium chloride 0.9 % bolus 500 mL (0 mLs Intravenous Stopped 07/14/20 1357)  0.9 %  sodium chloride infusion ( Intravenous New Bag/Given 07/14/20 1319)  sodium chloride 0.9 % bolus 500 mL (0 mLs Intravenous Stopped 07/14/20 1447)    ED Course  I have reviewed the triage vital signs and the nursing notes.  Pertinent labs & imaging results that were available during my care of the patient were reviewed by me and considered in my medical decision making (see chart for details).    MDM Rules/Calculators/A&P                          Patient is 55 year old female Covid positive approximately 10 days ago presented today with fatigue, continued left neck,  shoulder, back pain which is been episodic/more or less constant since she had a stroke several years ago.  She has residual left-sided deficits.  Physical exam is notable for diffuse muscular tenderness at that area.  She is on my examination  tachycardic and with soft blood pressures.  She seems to be between 80 and 90 systolic relatively consistently.  Her blood pressures are similar on both arms.  Discussed this case with my attending physician who assessed patient at bedside.  We will provide patient with IV fluid. She has had improvement in her blood pressure.  She is mentating well however she does have some drowsiness now after provide her with Robaxin for muscle pain.  CBC with WBC of 11 with low suspicion for significant infection.  Platelets mildly elevated this may be due to dehydration.  BMP with significant worsening kidney function.  See timeline below.  Creat 1.2 (04/09/20) >> 1.1 (06/24/20) >> 1.59 (07/04/20) >> 4.80   Urinalysis with few bacteria and small leukocytes She is not having any urinary symptoms other than because she has been collecting few weeks she has been "sitting in my urine "  Inflammatory markers are patient's COVID-19 obtained.  They are elevated.  Patient takes daily aspirin and lisinopril.  Blood pressure today somewhat soft, patient somewhat tachycardic, appears very dehydrated.  He is afebrile denies any new cough any dysuria, frequency urgency.    Chest xray - negative.   EKG - tachycardic  Urine negative for infection but w/ hyaline casts present.  Will admit for AKI and borderline hypotension. Dr. Lockie Mola discussed with hospitalist Dr. Ophelia Charter.  Shirley Hansen was evaluated in Emergency Department on 07/14/2020 for the symptoms described in the history of present illness. She was evaluated in the context of the global COVID-19 pandemic, which necessitated consideration that the patient might be at risk for infection with the SARS-CoV-2 virus that causes COVID-19. Institutional protocols and algorithms that pertain to the evaluation of patients at risk for COVID-19 are in a state of rapid change based on information released by regulatory bodies including the CDC and federal and state organizations.  These policies and algorithms were followed during the patient's care in the ED.  Final Clinical Impression(s) / ED Diagnoses Final diagnoses:  COVID-19  AKI (acute kidney injury) [facility])    Rx / DC Orders ED Discharge Orders    None       Gailen Shelter, [place] 07/14/20 1837    Virgina Norfolk, DO 07/15/20 1828

## 2020-07-14 NOTE — ED Provider Notes (Addendum)
Will I personally evaluated the patient during the encounter and completed a history, physical, procedures, medical decision making to contribute to the overall care of the patient and decision making for the patient briefly, the patient is a 55 y.o. female with history of stroke, hypertension who presents the ED with generalized weakness. Patient with blood pressure in the low 90s upper 80s upon arrival. Tested positive for Covid about 2 weeks ago. Has not had much to eat or drink during that time due to not having much of an appetite and not liking how things taste. She has had some cramps throughout. She has left-sided weakness from prior stroke and gets cramps often in her upper back. She has overall clear breath sounds. Does not have any specific shortness of breath or chest pain. Her mucous membranes appear dry. Chest x-ray is unremarkable. Patient has a creatinine of 4.8 which is up from 2 weeks ago when she was here with the creatinine was in the 1 range. Has a white count of 11.1 but normal lactic acid. Does not have a fever. Have a lower suspicion for septic process and overall suspect volume depletion and dehydration. She states her blood pressures normally in the low 100s. Will pursue infectious work-up will hold antibiotics at this time as believe that this is more dehydration related. Hemoglobin went from 15.1-13.9. She has not had any bowel movements and denies any black or bloody stools. We will do a rectal exam. Anticipate admission to medicine will order 2 L of IV fluids. Blood pressure has been more consistent in the low 90s since IV fluids has been started.  Reevaluated. Blood pressure tenuous between the 80s and 90s systolic. Bedside echocardiogram was very limited due to body habitus cannot get great windows of the IVC. What I saw the heart showed no pericardial effusion or obvious right heart strain. Hyperdynamic activity. Bladder scan still despite 2 L of fluid showed no urine and still  believe she is under resuscitated. An additional fluid bolus to be ordered. No concern for septic process at this time and suspect that this is still secondary to severe dehydration. Talked with the hospitalist service who will admit the patient but would like critical care involved. We will hold off on any vasopressors at this time. We will start empiric heparin treatment as PE is still on the differential. Therefore will start anticoagulation and patient will likely get a VQ scan at some point.   This chart was dictated using voice recognition software.  Despite best efforts to proofread,  errors can occur which can change the documentation meaning.     EKG Interpretation  Date/Time:  Wednesday July 14 2020 06:52:10 EST Ventricular Rate:  115 PR Interval:  118 QRS Duration: 122 QT Interval:  354 QTC Calculation: 489 R Axis:   -10 Text Interpretation: Sinus tachycardia Right bundle branch block Abnormal ECG Confirmed by Virgina Norfolk [phone]) on 07/14/2020 10:08:50 AM      .Critical Care Performed by: Virgina Norfolk, DO Authorized by: Virgina Norfolk, DO   Critical care provider statement:    Critical care time (minutes):  45   Critical care was necessary to treat or prevent imminent or life-threatening deterioration of the following conditions:  Renal failure and shock   Critical care was time spent personally by me on the following activities:  Blood draw for specimens, development of treatment plan with patient or surrogate, discussions with consultants, evaluation of patient's response to treatment, discussions with primary provider, examination of patient,  obtaining history from patient or surrogate, ordering and performing treatments and interventions, ordering and review of laboratory studies, ordering and review of radiographic studies, pulse oximetry, re-evaluation of patient's condition and review of old charts   I assumed direction of critical care for this patient from  another provider in my specialty: no           Virgina Norfolk, DO 07/14/20 1229    Virgina Norfolk, DO 07/14/20 1358

## 2020-07-14 NOTE — ED Triage Notes (Signed)
Pt reporting that she is having a spasm like pain in the upper left back, and when she has pain here she also has a pain in the far upper left chest area. Reports this pain has been going on for over a week. Hx of stroke, has weakness on the left side. Says she has COVID, has loss her sense of taste and not eating well, and feels weak, says she is unable to walk because of her weakness.

## 2020-07-14 NOTE — Progress Notes (Signed)
ANTICOAGULATION CONSULT NOTE  Pharmacy Consult for heparin Indication: rule out pulmonary embolus in setting of COVID infection and elevated d-dimer  No Known Allergies  PLabs: Recent Labs    07/14/20 0701 07/14/20 2200  HGB 13.9  --   HCT 43.6  --   PLT 533*  --   HEPARINUNFRC  --  0.82*  CREATININE 4.80*  --     Assessment: 55 yo admitted with weakness, blood pressure 80-90's on arrival.  Previous COVID infection diagnosed 07/05/20.  CXR unremarkable.  D-dimer elevated at 8.92 and VQ scan pending.  Patient does have elevated creatinine (today 4.8 while previously 1.53 in January 2022). Pharmacy consulted to dose heparin empirically while ruling out PE due to patient being high risk with recent COVID infection and elevated d-dimer.   Initial heparin level 0.82 units/ml.  Level was drawn ~4hours after bolus  Goal of Therapy:  Heparin level 0.3-0.7 units/ml Monitor platelets by anticoagulation protocol: Yes   Plan:  Decrease heparin 900 units/hr Follow up daily CBC,heparin level, s/sx bleeding, VQ scan results  Thanks for allowing pharmacy to be a part of this patient's care.  Talbert Cage, PharmD Clinical Pharmacist  07/14/2020 11:35 PM

## 2020-07-14 NOTE — Consult Note (Signed)
NAME:  Shirley Hansen, MRN:  [id number], DOB:  10/20/1965, LOS: 0 ADMISSION DATE:  07/14/2020, CONSULTATION DATE:  07/14/20 REFERRING MD:  Lockie Mola, CHIEF COMPLAINT:  N/V   Brief History   54 year w/ hx of CKD, HTN, prior stroke presenting with N/V, hypotension in setting of COVID infection.  History of present illness   80 year w/ hx of CKD, HTN, prior stroke presenting with N/V, hypotension in setting of COVID infection.  No diarrhea.  Not able to keep food down.  Having subjective fevers/chills.  No dyspnea or cough.  Mostly c/o acute on chronic back spasms.  Past Medical History  CKD (Cr 1.1 at [facility] on 06/24/20, 1.59 on 07/04/20) HTN Prior subcortical R hemispheric hemorrhage Hx kidney stones HLD CAD Tobacco abuse  Significant Hospital Events   n/a  Consults:  PCCM  Procedures:  none  Significant Diagnostic Tests:  CRP 4.1 Lactate 1.4 Ferritin 643  Micro Data:  COVID + ~ 2 weeks ago per report  Antimicrobials:     Interim history/subjective:  Consulted  Objective   Blood pressure (!) 80/64, pulse (!) 117, temperature 97.8 F (36.6 C), temperature source Oral, resp. rate 17, height 5\' 2"  (1.575 m), weight 79.4 kg, SpO2 100 %.        Intake/Output Summary (Last 24 hours) at 07/14/2020 1423 Last data filed at 07/14/2020 1357 Gross per 24 hour  Intake 2500 ml  Output --  Net 2500 ml   Filed Weights   07/14/20 1116  Weight: 79.4 kg    Examination: Constitutional: middle aged woman in no acute distress Eyes: eyes are anicteric, reactive to light Ears, nose, mouth, and throat: mucous membranes moist, trachea midline Cardiovascular: heart sounds are regular, tachycardic, ext are warm to touch. no edema Respiratory: clear, no wheezing Gastrointestinal: abdomen is soft with + BS Skin: No rashes, normal turgor Neurologic: moves all 4 ext to command Psychiatric: anxious  UA slightly positive BUN/Cr 33/1.59 >> 60/4.8  Lactate benign Ferritin 643 CRP 4.1 D dimer  +  Resolved Hospital Problem list   n/a  Assessment & Plan:  Acute on chronic renal injury in setting of poor PO, N/V, home ACEi use - Agree with generous IVF, consider LR to reduce acid load - Trend, avoid nephrotoxins, check renal 06-16-2000  ?Cystitits- UA borderline, defer abx to primary, Pct pending  Relative hypotension, tachycardia- lactate benign, some of her tachycardia driven by chronic spasms from prior stroke so would tx this.  SBP normally run in low 100s for her - Dose of valium given in ER (cannot due robaxin 2/2 aki)  COVID+ - Per primary, no pulmonary symptoms  Elevated D dimer- f/u VQ scan and LE duplex, if both neg can prob just do heparin dvt ppx  Best practice:  Diet: npo, advance as tolerated if nausea under control Pain/Anxiety/Delirium protocol (if indicated): valium x 1 given, remainder per primary VAP protocol (if indicated): n/a DVT prophylaxis: heparin gtt pending above studies GI prophylaxis: n/a Glucose control: n/a Mobility: BR Code Status: full Family Communication: per primary Disposition: should be stable for progressive, discussed with Dr. [name], we are available should her Bps tank further despite IVF  Labs   CBC: Recent Labs  Lab 07/14/20 0701  WBC 11.1*  HGB 13.9  HCT 43.6  MCV 89.5  PLT 533*    Basic Metabolic Panel: Recent Labs  Lab 07/14/20 0701  NA 136  K 3.8  CL 94*  CO2 26  GLUCOSE 118*  BUN 60*  CREATININE 4.80*  CALCIUM 9.9   GFR: Estimated Creatinine Clearance: 13.1 mL/min (A) (by C-G formula based on SCr of 4.8 mg/dL (H)). Recent Labs  Lab 07/14/20 0701 07/14/20 1119  WBC 11.1*  --   LATICACIDVEN  --  1.4    Liver Function Tests: No results for input(s): AST, ALT, ALKPHOS, BILITOT, PROT, ALBUMIN in the last 168 hours. No results for input(s): LIPASE, AMYLASE in the last 168 hours. No results for input(s): AMMONIA in the last 168 hours.  ABG No results found for: PHART, PCO2ART, PO2ART, HCO3, TCO2,  ACIDBASEDEF, O2SAT   Coagulation Profile: No results for input(s): INR, PROTIME in the last 168 hours.  Cardiac Enzymes: No results for input(s): CKTOTAL, CKMB, CKMBINDEX, TROPONINI in the last 168 hours.  HbA1C: No results found for: HGBA1C  CBG: No results for input(s): GLUCAP in the last 168 hours.  Review of Systems:    Positive Symptoms in bold:  Constitutional fevers, chills, weight loss, fatigue, anorexia, malaise  Eyes decreased vision, double vision, eye irritation  Ears, Nose, Mouth, Throat sore throat, trouble swallowing, sinus congestion  Cardiovascular chest pain, paroxysmal nocturnal dyspnea, lower ext edema, palpitations   Respiratory SOB, cough, DOE, hemoptysis, wheezing  Gastrointestinal nausea, vomiting, diarrhea  Genitourinary burning with urination, trouble urinating  Musculoskeletal joint aches, joint swelling, back pain  Integumentary  rashes, skin lesions  Neurological focal weakness, focal numbness, trouble speaking, headaches  Psychiatric depression, anxiety, confusion  Endocrine polyuria, polydipsia, cold intolerance, heat intolerance  Hematologic abnormal bruising, abnormal bleeding, unexplained nose bleeds  Allergic/Immunologic recurrent infections, hives, swollen lymph nodes    Past Medical History   Coronary artery disease MI 2017  History of stroke  Hyperlipidemia with target LDL less than 100 04/25/2019  Hypertension  ICH (intracerebral hemorrhage) (CMS-HCC) 04/14/2019  Kidney stone  Pancreatitis      Social History    Smoker  Family History   No family history of COVID  Allergies No Known Allergies   Home Medications  Prior to Admission medications   Medication Sig Start Date End Date Taking? Authorizing Provider  Artificial Tear Solution (GENTEAL TEARS OP) Place 1 drop into both eyes daily as needed (dry eyes).   Yes [provider]  aspirin EC 81 MG tablet Take 81 mg by mouth daily. Swallow whole.   Yes [provider]  atorvastatin (LIPITOR) 40 MG tablet Take 40 mg by mouth at bedtime. 06/17/20  Yes [provider]  brimonidine (ALPHAGAN) 0.2 % ophthalmic solution Place 1 drop into both eyes 2 (two) times daily. 06/26/20  Yes [provider]  diclofenac Sodium (VOLTAREN) 1 % GEL Apply 1 application topically 4 (four) times daily as needed (pain). 05/20/20  Yes [provider]  dorzolamide-timolol (COSOPT) 22.3-6.8 MG/ML ophthalmic solution Place 1 drop into both eyes 2 (two) times daily. 05/31/20  Yes [provider]  gabapentin (NEURONTIN) 300 MG capsule Take 600 mg by mouth at bedtime. 06/11/20  Yes [provider]  hydrochlorothiazide (HYDRODIURIL) 25 MG tablet Take 25 mg by mouth daily. 06/11/20  Yes [provider]  latanoprost (XALATAN) 0.005 % ophthalmic solution Place 1 drop into both eyes at bedtime. 06/17/20  Yes [provider]  lisinopril (ZESTRIL) 40 MG tablet Take 40 mg by mouth daily. 06/16/20  Yes [provider]  methocarbamol (ROBAXIN) 500 MG tablet Take 500 mg by mouth 3 (three) times daily. 05/20/20  Yes [provider]  potassium chloride SA (KLOR-CON) 20 MEQ tablet Take 20 mEq  by mouth daily. 06/26/20  Yes [provider]

## 2020-07-14 NOTE — H&P (Signed)
History and Physical    Shirley Hansen UUV:[id number] DOB: Jun 21, 1965 DOA: 07/14/2020  PCP: Oneita Hurt, No Consultants:  Orlene Erm - neurology; Scotty Court - sports medicine Patient coming from:  Home; NOK: Niece, Wendee Beavers, [phone]  Chief Complaint: back pain, COVID  HPI: Shirley Hansen is a 55 y.o. female with medical history significant of CVA with left-sided deficits; HTN; and stage 3a CKD presenting with worsening COVID symptoms, positive on 1/24.  She was seen in the ER on 1/24 for progressive and generalized weakness; she tested positive for COVID.  She was supposed to be transferred to her sister's house in [place], but it is not clear that this happened.  For the last few days to a week, she has had debilitating back spasms.  She has not been eating or drinking and has generally just felt miserable - but no SOB or hypoxia.  She had diarrhea earlier in the course of her illness but not recently.  She does report skin breakdown on her buttocks.      ED Course:  COVID + 1/24, here with weakness.  H/o CVA, left-sided weakness.  Not eating/drinking.  Very labile BPs with borderline hypotension.  Severe AKI, creatinine 4.80 - previously 1.59 on 1/23.  Heme negative.  Inflammatory markers up.  Not hypoxic.  Given Robaxin for back spasms.  No PE signs with [name].  Start heparin empirically while awaiting VQ.  Has not made urine yet.   Review of Systems: As per HPI; otherwise review of systems reviewed and negative.   Ambulatory Status:  Non-ambulatory  COVID Vaccine Status:  First shot in January  Past Medical History:  Diagnosis Date  . Hypertension   . Pancreatitis   . Stage 3a chronic kidney disease (HCC)   . Stroke [facility])     Past Surgical History:  Procedure Laterality Date  . TUBAL LIGATION      Social History   Socioeconomic History  . Marital status: Single    Spouse name: Not on file  . Number of children: Not on file  . Years of education: Not on file  . Highest education level:  Not on file  Occupational History  . Not on file  Tobacco Use  . Smoking status: Former Games developer  . Smokeless tobacco: Never Used  Substance and Sexual Activity  . Alcohol use: Never  . Drug use: Never  . Sexual activity: Not on file  Other Topics Concern  . Not on file  Social History Narrative  . Not on file   Social Determinants of Health   Financial Resource Strain: Not on file  Food Insecurity: Not on file  Transportation Needs: Not on file  Physical Activity: Not on file  Stress: Not on file  Social Connections: Not on file  Intimate Partner Violence: Not on file    No Known Allergies  History reviewed. No pertinent family history.  Prior to Admission medications   Medication Sig Start Date End Date Taking? Authorizing Provider  Artificial Tear Solution (GENTEAL TEARS OP) Place 1 drop into both eyes daily as needed (dry eyes).    [provider]  aspirin EC 81 MG tablet Take 81 mg by mouth daily. Swallow whole.    [provider]  atorvastatin (LIPITOR) 40 MG tablet Take 40 mg by mouth at bedtime. 06/17/20   [provider]  brimonidine (ALPHAGAN) 0.2 % ophthalmic solution Place 1 drop into both eyes 2 (two) times daily. 06/26/20   [provider]  diclofenac Sodium (VOLTAREN) 1 %  GEL Apply 1 application topically 4 (four) times daily as needed (pain). 05/20/20   [provider]  dorzolamide-timolol (COSOPT) 22.3-6.8 MG/ML ophthalmic solution Place 1 drop into both eyes 2 (two) times daily. 05/31/20   [provider]  gabapentin (NEURONTIN) 300 MG capsule Take 600 mg by mouth at bedtime. 06/11/20   [provider]  hydrochlorothiazide (HYDRODIURIL) 25 MG tablet Take 25 mg by mouth daily. 06/11/20   [provider]  latanoprost (XALATAN) 0.005 % ophthalmic solution Place 1 drop into both eyes at bedtime. 06/17/20   [provider]  lisinopril (ZESTRIL) 40 MG tablet Take 40 mg by mouth daily. 06/16/20    [provider]  methocarbamol (ROBAXIN) 500 MG tablet Take 500 mg by mouth 3 (three) times daily. 05/20/20   [provider]  potassium chloride SA (KLOR-CON) 20 MEQ tablet Take 20 mEq by mouth daily. 06/26/20   [provider]    Physical Exam: Vitals:   07/14/20 1430 07/14/20 1451 07/14/20 1515 07/14/20 1643  BP: (!) 87/54 (!) 83/62 (!) 81/55 114/88  Pulse: (!) 112 (!) 114 (!) 114 (!) 121  Resp: 15 13 15 15   Temp:    98.8 F (37.1 C)  TempSrc:    Oral  SpO2: 100% 100% 99% 96%  Weight:      Height:         . General:  Appears fatigued, generalized weakness . Eyes:  PERRL, EOMI, normal lids, iris . ENT:  grossly normal hearing, lips & tongue, mmm . Neck:  no LAD, masses or thyromegaly . Cardiovascular:  RR with mild tachycardia, no m/r/g. No LE edema.  Respiratory:   CTA bilaterally with no wheezes/rales/rhonchi.  Normal respiratory effort. . Abdomen:  soft, point TTP in RLQ, ND, NABS . Back:   normal alignment, no CVAT . Skin:  See pics - in the bottom picture, she has a rash that appears to be in a linear distribution from her left side and onto the back.  Lesions do not appear to be in various stages of healing.   On the top picture, this is her left back and the lesions appear to extend along most of her lower back.  While this could be c/w zoster, it is not overly concerning for shingles at this time based on the appearance and distribution of the lesions.       . Musculoskeletal:  Generalized weakness - reported chronic L hemiparesis but now diffuse . Lower extremity:  No LE edema.  Limited foot exam with no ulcerations.  2+ distal pulses. Marland Kitchen Psychiatric:  flat mood and affect, speech fluent and appropriate, AOx3 . Neurologic:  CN 2-12 grossly intact, diffusely weak    Radiological Exams on Admission: Independently reviewed - see discussion in A/P where applicable  DG Chest Portable 1 View  Result Date: 07/14/2020 CLINICAL DATA:   Dyspnea, COVID pneumonia EXAM: PORTABLE CHEST 1 VIEW COMPARISON:  07/05/2020 FINDINGS: The heart size and mediastinal contours are within normal limits. Both lungs are clear. The visualized skeletal structures are unremarkable. IMPRESSION: No active disease. Electronically Signed   By: 07/07/2020 MD   On: 07/14/2020 10:43    EKG: Independently reviewed.  Sinus tachycardia with rate 115; RBBB changes which was present on 1/23  Labs on Admission: I have personally reviewed the available labs and imaging studies at the time of the admission.  Pertinent labs:   Glucose 118 BUN 60/Creatinine 4.80/GFR 10; 33/1.59/38 on 1/23 Ferritin 643 CRP 4.1  Lactate 1.4 WBC 11.1 Platelets 533 D-dimer 8.92 Fibrinogen 452 HCG 17.9 UA: small LE, 30 protein, few bacteria Heme negative COVID POSITIVE on 1/24   Assessment/Plan Active Problems:   COVID-19   Stroke [facility])   Stage 3a chronic kidney disease (HCC)   Hypertension   Acute respiratory failure with hypoxia due to COVID-19 PNA -Patient with presenting with generalized weakness and inability to take PO in the setting of known COVID infection -She does not have a current O2 requirement  -COVID POSITIVE -The patient has comorbidities which may increase the risk for ARDS/MODS including:  HTN, obesity, CKD -She had persistent hypotension upon presentation but this appears to have been related to profound dehydration -Pertinent labs concerning for COVID include increased BUN/Creatinine; markedly elevated D-dimer (>>>1); increased ferritin; low procalcitonin; elevated CRP (not >7); increased fibrinogen -CXR with no active disease   -Given tachycardia, decreased mobility, and significantly elevated D-dimer, patient was started empirically on heparin drip while awaiting LE dopplers and VQ scan -Will not treat with broad-spectrum antibiotics given procalcitonin <0.5 -Will admit for further evaluation, close monitoring, and treatment -Monitor on  telemetry x at least 24 hours -At this time, will attempt to avoid use of aerosolized medications and use HFAs instead -Will check daily labs including BMP with Mag, Phos; LFTs; CBC with differential; CRP; ferritin; fibrinogen; D-dimer -Will order steroids and Remdesivir (pharmacy consult) given +COVID test, elevated CRP -If the patient shows clinical deterioration, consider transfer to ICU with PCCM consultation -Consider IL-6 agonist (Actemra) and/or JAK inhibitor (baricitinib) if the patient does not stabilize on current treatment or if the patient has marked clinical decompensation; the patient does not appear to require this treatment at this time -Will attempt to maintain euvolemia to a net negative fluid status -Will ask the patient to maintain an awake prone position as much as possible -PT/OT consults -Encourage mobilization/ambulation as much as possible -Patient was seen wearing full PPE including: gown, gloves, head cover, N95, and face shield; donning and doffing was in compliance with current standards.  AKI on Stage 3a CKD -Creatinine on 1/23 was 1.59, currently 4.8 -She reports minimal PO intake and had made minimal UOP in the ER despite several liters of IVF (bladder scan also negative) -Likely exacerbated by Lisinopril and HCTZ -Continue with aggressive IVF hydration for now -Recheck daily BMP -Renal US -UA does not appear to be c/w UTI at this time  Back spasms -Continue oxycodone as needed -Hold Neurontin given renal dysfunction -PT/OT consults  Rash -Current distribution and appearance of lesions does not appear to be c/w zoster -Will not treat for now -If new lesions arise, however, would suggest empirically treating with Valtrex  Hypotension with h/o HTN -Hold Lisinopril and HCTZ due to renal failure and hypotension -Likely associated with profound dehydration -Hydrating -PCCM consult appreciated  H/o CVA -Reported L hemiparesis but had diffuse weakness at  the time of admission -Continue ASA  HLD -Continue Lipitor  Obesity -BMI is 32 -Weight loss should be encouraged -Outpatient PCP/bariatric medicine f/u encouraged    Level of care: Progressive DVT prophylaxis:  Heparin drip Code Status:  Full - confirmed with patient Family Communication: None present; I called the patient's niece but she was not available and communication with the person who answered was limited by a language barrier. Disposition Plan:  The patient is from: home  Anticipated d/c is to: home without [facility] services  Anticipated d/c date will depend on clinical response to treatment, likely between 3 days (with completion  of outpatient Remdesivir treatment) and 5 days  Patient is currently: acutely ill Consults called: PCCM; PT/OT/RT  Admission status: Admit - It is my clinical opinion that admission to INPATIENT is reasonable and necessary because of the expectation that this patient will require hospital care that crosses at least 2 midnights to treat this condition based on the medical complexity of the problems presented.  Given the aforementioned information, the predictability of an adverse outcome is felt to be significant.     Jonah Blue MD [facility]   How to contact the [facility] Attending or Consulting provider 7A - 7P or covering provider during after hours 7P -7A, for this patient?  1. Check the care team in [facility] and look for a) attending/consulting [facility] provider listed and b) the [facility] team listed 2. Log into [email or web address] and use [place]'s universal password to access. If you do not have the password, please contact the hospital operator. 3. Locate the [facility] provider you are looking for under [facility] and page to a number that you can be directly reached. 4. If you still have difficulty reaching the provider, please page the [facility] (Director on Call) for the Hospitalists listed on amion for assistance.   07/14/2020, 6:25 PM

## 2020-07-14 NOTE — ED Triage Notes (Signed)
Pt arrives via [facility] from home with c/o back pain, spasm like pain. Also, pt reported that she was covid + 1/24, and is not feeling any better. Yesterday, she had not taken her medications, not eating well. Unable to obtain BP en route, HR 120.

## 2020-07-15 ENCOUNTER — Inpatient Hospital Stay (HOSPITAL_COMMUNITY): Payer: Medicaid Other

## 2020-07-15 DIAGNOSIS — R0602 Shortness of breath: Secondary | ICD-10-CM | POA: Diagnosis not present

## 2020-07-15 DIAGNOSIS — R609 Edema, unspecified: Secondary | ICD-10-CM

## 2020-07-15 DIAGNOSIS — N179 Acute kidney failure, unspecified: Secondary | ICD-10-CM | POA: Diagnosis not present

## 2020-07-15 DIAGNOSIS — U071 COVID-19: Secondary | ICD-10-CM | POA: Diagnosis not present

## 2020-07-15 DIAGNOSIS — I10 Essential (primary) hypertension: Secondary | ICD-10-CM | POA: Diagnosis not present

## 2020-07-15 LAB — CBC WITH DIFFERENTIAL/PLATELET
Abs Immature Granulocytes: 0.06 10*3/uL (ref 0.00–0.07)
Basophils Absolute: 0 10*3/uL (ref 0.0–0.1)
Basophils Relative: 0 %
Eosinophils Absolute: 0 10*3/uL (ref 0.0–0.5)
Eosinophils Relative: 0 %
HCT: 33.2 % — ABNORMAL LOW (ref 36.0–46.0)
Hemoglobin: 11.3 g/dL — ABNORMAL LOW (ref 12.0–15.0)
Immature Granulocytes: 1 %
Lymphocytes Relative: 10 %
Lymphs Abs: 1 10*3/uL (ref 0.7–4.0)
MCH: 30.1 pg (ref 26.0–34.0)
MCHC: 34 g/dL (ref 30.0–36.0)
MCV: 88.5 fL (ref 80.0–100.0)
Monocytes Absolute: 0.3 10*3/uL (ref 0.1–1.0)
Monocytes Relative: 3 %
Neutro Abs: 8.7 10*3/uL — ABNORMAL HIGH (ref 1.7–7.7)
Neutrophils Relative %: 86 %
Platelets: 295 10*3/uL (ref 150–400)
RBC: 3.75 MIL/uL — ABNORMAL LOW (ref 3.87–5.11)
RDW: 13.1 % (ref 11.5–15.5)
WBC: 10.1 10*3/uL (ref 4.0–10.5)
nRBC: 0.3 % — ABNORMAL HIGH (ref 0.0–0.2)

## 2020-07-15 LAB — COMPREHENSIVE METABOLIC PANEL
ALT: 27 U/L (ref 0–44)
AST: 36 U/L (ref 15–41)
Albumin: 2.9 g/dL — ABNORMAL LOW (ref 3.5–5.0)
Alkaline Phosphatase: 37 U/L — ABNORMAL LOW (ref 38–126)
Anion gap: 13 (ref 5–15)
BUN: 49 mg/dL — ABNORMAL HIGH (ref 6–20)
CO2: 21 mmol/L — ABNORMAL LOW (ref 22–32)
Calcium: 8.4 mg/dL — ABNORMAL LOW (ref 8.9–10.3)
Chloride: 105 mmol/L (ref 98–111)
Creatinine, Ser: 2.64 mg/dL — ABNORMAL HIGH (ref 0.44–1.00)
GFR, Estimated: 21 mL/min — ABNORMAL LOW (ref >60)
Glucose, Bld: 164 mg/dL — ABNORMAL HIGH (ref 70–99)
Potassium: 4.3 mmol/L (ref 3.5–5.1)
Sodium: 139 mmol/L (ref 135–145)
Total Bilirubin: 0.5 mg/dL (ref 0.3–1.2)
Total Protein: 6 g/dL — ABNORMAL LOW (ref 6.5–8.1)

## 2020-07-15 LAB — MAGNESIUM: Magnesium: 1.8 mg/dL (ref 1.7–2.4)

## 2020-07-15 LAB — HEPARIN LEVEL (UNFRACTIONATED)
Heparin Unfractionated: 0.76 IU/mL — ABNORMAL HIGH (ref 0.30–0.70)
Heparin Unfractionated: 0.99 IU/mL — ABNORMAL HIGH (ref 0.30–0.70)
Heparin Unfractionated: 0.69 IU/mL (ref 0.30–0.70)

## 2020-07-15 LAB — C-REACTIVE PROTEIN: CRP: 3.3 mg/dL — ABNORMAL HIGH (ref <1.0)

## 2020-07-15 LAB — BRAIN NATRIURETIC PEPTIDE: B Natriuretic Peptide: 6.5 pg/mL (ref 0.0–100.0)

## 2020-07-15 LAB — PHOSPHORUS: Phosphorus: 2.6 mg/dL (ref 2.5–4.6)

## 2020-07-15 LAB — FERRITIN: Ferritin: 474 ng/mL — ABNORMAL HIGH (ref 11–307)

## 2020-07-15 LAB — MRSA PCR SCREENING: MRSA by PCR: NEGATIVE

## 2020-07-15 LAB — D-DIMER, QUANTITATIVE: D-Dimer, Quant: 7.25 ug/mL-FEU — ABNORMAL HIGH (ref 0.00–0.50)

## 2020-07-15 MED ORDER — TECHNETIUM TO 99M ALBUMIN AGGREGATED
4.3000 | Freq: Once | INTRAVENOUS | Status: AC | PRN
  Administered 2020-07-15: 4.3 via INTRAVENOUS

## 2020-07-15 MED ORDER — PANTOPRAZOLE SODIUM 40 MG PO TBEC
40.0000 mg | DELAYED_RELEASE_TABLET | Freq: Every day | ORAL | Status: DC
  Administered 2020-07-15 – 2020-07-28 (×14): 40 mg via ORAL
  Filled 2020-07-15 (×14): qty 1

## 2020-07-15 MED ORDER — ENSURE ENLIVE PO LIQD
237.0000 mL | Freq: Two times a day (BID) | ORAL | Status: DC
  Administered 2020-07-15 – 2020-07-28 (×14): 237 mL via ORAL
  Filled 2020-07-15 (×2): qty 237

## 2020-07-15 MED ORDER — METHYLPREDNISOLONE SODIUM SUCC 40 MG IJ SOLR
40.0000 mg | Freq: Every day | INTRAMUSCULAR | Status: DC
  Administered 2020-07-15 – 2020-07-17 (×3): 40 mg via INTRAVENOUS
  Filled 2020-07-15 (×3): qty 1

## 2020-07-15 MED ORDER — METHYLPREDNISOLONE SODIUM SUCC 125 MG IJ SOLR: 60.0000 mg | Freq: Two times a day (BID) | INTRAMUSCULAR | Status: DC

## 2020-07-15 MED ORDER — ADULT MULTIVITAMIN W/MINERALS CH
1.0000 | ORAL_TABLET | Freq: Every day | ORAL | Status: DC
  Administered 2020-07-15 – 2020-07-28 (×14): 1 via ORAL
  Filled 2020-07-15 (×14): qty 1

## 2020-07-15 MED ORDER — SODIUM CHLORIDE 0.9 % IV SOLN
1.0000 g | INTRAVENOUS | Status: AC
  Administered 2020-07-15 – 2020-07-17 (×3): 1 g via INTRAVENOUS
  Filled 2020-07-15 (×4): qty 10

## 2020-07-15 MED ORDER — LACTATED RINGERS IV SOLN: INTRAVENOUS | Status: AC

## 2020-07-15 MED ORDER — PNEUMOCOCCAL VAC POLYVALENT 25 MCG/0.5ML IJ INJ
0.5000 mL | INJECTION | INTRAMUSCULAR | Status: AC
  Administered 2020-07-24: 0.5 mL via INTRAMUSCULAR
  Filled 2020-07-15: qty 0.5

## 2020-07-15 NOTE — Evaluation (Signed)
Physical Therapy Evaluation Patient Details Name: Shirley Hansen MRN: [id number] DOB: 1966/05/14 Today's Date: 07/15/2020   History of Present Illness  55 y.o. female with medical history significant of CVA with left-sided deficits; HTN; and stage 3a CKD presenting with worsening COVID symptoms, positive on 1/24.    Clinical Impression  Pt admitted with above diagnosis. PTA pt lived at home with family, mod I household mobility using RW vs hemiwalker. On eval, pt required mod assist bed mobility, max assist sit to stand, and max assist SPT. SpO2 91% on RA. Pt with h/o R CVA presenting with L-side deficits (decreased strength, decreased sensation, increased tone). Pt currently with functional limitations due to the deficits listed below. Pt will benefit from skilled PT to increase their independence and safety with mobility to allow discharge to the venue listed below.       Follow Up Recommendations SNF;Supervision/Assistance - 24 hour    Equipment Recommendations  None recommended by PT    Recommendations for Other Services       Precautions / Restrictions Precautions Precautions: Fall Restrictions Weight Bearing Restrictions: No      Mobility  Bed Mobility Overal bed mobility: Needs Assistance Bed Mobility: Supine to Sit     Supine to sit: Mod assist;HOB elevated     General bed mobility comments: assist with LLE and to elevate trunk, use of bed pad to scoot to EOB, increased time to maximize pt participation    Transfers Overall transfer level: Needs assistance Equipment used: 1 person hand held assist Transfers: Sit to/from [employer] Transfers Sit to Stand: Max assist Stand pivot transfers: Max assist       General transfer comment: max assist sit to stand x 2 trials; after 2nd trial, pt able to take very short pivot steps bed to recliner toward L side.  Ambulation/Gait             General Gait Details: unable  Stairs            Wheelchair  Mobility    Modified Rankin (Stroke Patients Only)       Balance Overall balance assessment: Needs assistance Sitting-balance support: Feet supported;Single extremity supported Sitting balance-Leahy Scale: Fair     Standing balance support: Single extremity supported;During functional activity Standing balance-Leahy Scale: Poor Standing balance comment: heavy reliance on external support                             Pertinent Vitals/Pain Pain Assessment: Faces Faces Pain Scale: Hurts little more Pain Location: BLE Pain Descriptors / Indicators: Tender Pain Intervention(s): Repositioned;Monitored during session;Limited activity within patient's tolerance    Home Living Family/patient expects to be discharged to:: Private residence Living Arrangements: Children;Other relatives Available Help at Discharge: Family;Available 24 hours/day Type of Home: House Home Access: Level entry     Home Layout: One level Home Equipment: Walker - 2 wheels;Shower seat;Bedside commode;Wheelchair - Careers adviser (comment) Psychologist, educational)      Prior Function Level of Independence: Needs assistance   Gait / Transfers Assistance Needed: mod I household distances with RW vs hemiwalker  ADL's / Homemaking Assistance Needed: family assists with iADLs and transfer in/out tub/shower for bathing        Hand Dominance   Dominant Hand: Right    Extremity/Trunk Assessment   Upper Extremity Assessment Upper Extremity Assessment: LUE deficits/detail LUE Deficits / Details: h/o R CVA with L residual deficits; increased tone/flexion synergy pattern noted with initiation of  movement/transfers    Lower Extremity Assessment Lower Extremity Assessment: LLE deficits/detail;Generalized weakness LLE Deficits / Details: h/o R CVA with L residual deficits; increased tone noted with initiation of mobility LLE Sensation: decreased proprioception;decreased light touch    Cervical / Trunk  Assessment Cervical / Trunk Assessment: Kyphotic  Communication   Communication: No difficulties  Cognition Arousal/Alertness: Awake/alert Behavior During Therapy: WFL for tasks assessed/performed Overall Cognitive Status: Within Functional Limits for tasks assessed                                 General Comments: tearful at times during session due to concern/frustration over current med status      General Comments General comments (skin integrity, edema, etc.): SpO2 91% on RA. Resting HR 101. Max HR 127. BP 139/99 sitting EOB    Exercises     Assessment/Plan    PT Assessment Patient needs continued PT services  PT Problem List Decreased strength;Decreased mobility;Decreased coordination;Decreased activity tolerance;Decreased balance;Pain       PT Treatment Interventions Therapeutic activities;DME instruction;Gait training;Therapeutic exercise;Patient/family education;Balance training;Functional mobility training    PT Goals (Current goals can be found in the Care Plan section)  Acute Rehab PT Goals Patient Stated Goal: to walk PT Goal Formulation: With patient Time For Goal Achievement: 07/29/20 Potential to Achieve Goals: Good    Frequency Min 2X/week   Barriers to discharge        Co-evaluation               AM-PAC PT "6 Clicks" Mobility  Outcome Measure Help needed turning from your back to your side while in a flat bed without using bedrails?: A Little Help needed moving from lying on your back to sitting on the side of a flat bed without using bedrails?: A Lot Help needed moving to and from a bed to a chair (including a wheelchair)?: A Lot Help needed standing up from a chair using your arms (e.g., wheelchair or bedside chair)?: A Lot Help needed to walk in hospital room?: Total Help needed climbing 3-5 steps with a railing? : Total 6 Click Score: 11    End of Session Equipment Utilized During Treatment: Gait belt Activity Tolerance:  Patient limited by fatigue Patient left: in chair;with call bell/phone within reach Nurse Communication: Mobility status PT Visit Diagnosis: Unsteadiness on feet (R26.81);Other abnormalities of gait and mobility (R26.89);Muscle weakness (generalized) (M62.81);Pain    Time: 0921-1005 PT Time Calculation (min) (ACUTE ONLY): 44 min   Charges:   PT Evaluation $PT Eval Moderate Complexity: 1 Mod PT Treatments $Therapeutic Activity: 23-37 mins        Shirley Hansen, PT  Office # [phone] Pager [phone]   Shirley Hansen 07/15/2020, 11:33 AM

## 2020-07-15 NOTE — Progress Notes (Signed)
   07/15/20 1000  Clinical Encounter Type  Visited With Patient (Via phone)  Visit Type Spiritual support  Referral From Nurse  Consult/Referral To Chaplain  The chaplain responded to the consultation. The patient was contacted via phone by the chaplain. The patient asked that the Chaplain pray for her healing. This note was prepared by Deneen Harts, M.Div..  For questions please contact by phone [phone].

## 2020-07-15 NOTE — Progress Notes (Addendum)
PROGRESS NOTE                                                                                                                                                                                                             Patient Demographics:    Shirley Hansen, is a 55 y.o. female, DOB - [birth year], ZOX:[id number]  Outpatient Primary MD for the patient is Pcp, No    LOS - 1  Admit date - 07/14/2020    Chief Complaint  Patient presents with  . Back Pain  . Weakness    Covid        Brief Narrative (HPI from H&P)  - Shirley Hansen is a 55 y.o. female with medical history significant of CVA with left-sided deficits; HTN; and stage 3a CKD presenting with worsening COVID symptoms, positive on 1/24.  She was seen in the ER on 1/24 for progressive and generalized weakness, she had no shortness of breath her main symptom was generalized weakness, in the ER her work-up was consistent with severe dehydration she was found to have extremely high D-dimer, AKI, hypotension and she was admitted   Subjective:    Shirley Hansen today has, No headache, No chest pain, No abdominal pain - No Nausea, No new weakness tingling or numbness, no SOB.   Assessment  & Plan :   1. Acute COVID-19 infection - she has received 1 shot of Pfizer vaccine recently, she has minimal pulmonary involvement and symptoms are more generalized with severe dehydration and AKI.  CRP is borderline but her D-dimer is extremely elevated.  She will be treated with IV fluids, low-dose steroids and Remdesivir.  With rapid tapering of steroids.  Encouraged the patient to sit up in chair in the daytime use I-S and flutter valve for pulmonary toiletry and then prone in bed when at night.  Will advance activity and titrate down oxygen as possible.   SpO2: 95 %  Recent Labs  Lab 07/14/20 0701 07/14/20 1119 07/15/20 0034  WBC 11.1*  --  10.1  HGB 13.9  --  11.3*  HCT 43.6  --  33.2*   PLT 533*  --  295  CRP  --  4.1* 3.3*  BNP  --   --  6.5  DDIMER  --  8.92* 7.25*  PROCALCITON 0.18  --   --  AST  --   --  36  ALT  --   --  27  ALKPHOS  --   --  37*  BILITOT  --   --  0.5  ALBUMIN  --   --  2.9*  LATICACIDVEN  --  1.4  --     2.  Dehydration with AKI.  Hydrate with IV fluids avoid nephrotoxins.  She has underlying CKD 3A with baseline creatinine around 1.5.  3.  History of CVA with left-sided hemiparesis.  Supportive care.  Continue aspirin statin for secondary prevention, PT OT as needed.  4.  High D-dimer with severe dehydration.  High risk for developing a blood clot, leg ultrasound negative, V/Q pending, for now agree with full dose anticoagulation till D-dimer is consistently below 2.  5.  Obesity with BMI 32.  Follow with PCP for weight loss.  6.  Possible UTI.  3 days of Rocephin, follow cultures.      Condition -   Guarded  Family Communication  : Niece Ernestine Mcmurrayquila [phone] on 07/15/2020 - wrong number  Code Status :  Full  Consults  :  PCCM  Procedures  :    Leg US  -ve  VQ Scan   PUD Prophylaxis : PPI  Disposition Plan  :    Status is: Inpatient  Remains inpatient appropriate because:IV treatments appropriate due to intensity of illness or inability to take PO   Dispo: The patient is from: Home              Anticipated d/c is to: Home              Anticipated d/c date is: > 3 days              Patient currently is not medically stable to d/c.   Difficult to place patient No   DVT Prophylaxis  :  Hep gtt  Lab Results  Component Value Date   PLT 295 07/15/2020    Diet :  Diet Order            Diet regular Room service appropriate? Yes; Fluid consistency: Thin  Diet effective now                  Inpatient Medications  Scheduled Meds: . vitamin C  500 mg Oral Daily  . aspirin EC  81 mg Oral Daily  . atorvastatin  40 mg Oral QHS  . brimonidine  1 drop Both Eyes BID  . diazepam  5 mg Intravenous Once  .  dorzolamide-timolol  1 drop Both Eyes BID  . latanoprost  1 drop Both Eyes QHS  . methylPREDNISolone (SOLU-MEDROL) injection  40 mg Intravenous Daily  . pantoprazole  40 mg Oral Daily  . [START ON 07/16/2020] pneumococcal 23 valent vaccine  0.5 mL Intramuscular Tomorrow-1000  . sodium chloride flush  3 mL Intravenous Q12H  . sodium chloride flush  3 mL Intravenous Q12H  . zinc sulfate  220 mg Oral Daily   Continuous Infusions: . sodium chloride    . heparin 900 Units/hr (07/14/20 2342)  . lactated ringers    . remdesivir 100 mg in NS 100 mL 100 mg (07/15/20 0923)   PRN Meds:.sodium chloride, acetaminophen **OR** [DISCONTINUED] acetaminophen, albuterol, bisacodyl, calcium carbonate (dosed in mg elemental calcium), camphor-menthol **AND** hydrOXYzine, chlorpheniramine-HYDROcodone, docusate sodium, feeding supplement (NEPRO CARB STEADY), guaiFENesin-dextromethorphan, [DISCONTINUED] ondansetron **OR** ondansetron (ZOFRAN) IV, oxyCODONE, polyethylene glycol, sodium chloride flush, sorbitol, zolpidem  Antibiotics  :  Anti-infectives (From admission, onward)   Start     Dose/Rate Route Frequency Ordered Stop   07/15/20 1000  remdesivir 100 mg in sodium chloride 0.9 % 100 mL IVPB  Status:  Discontinued       "Followed by" Linked Group Details   100 mg 200 mL/hr over 30 Minutes Intravenous Daily 07/14/20 1449 07/14/20 1451   07/15/20 1000  remdesivir 100 mg in sodium chloride 0.9 % 100 mL IVPB       "Followed by" Linked Group Details   100 mg 200 mL/hr over 30 Minutes Intravenous Daily 07/14/20 1451 07/19/20 0959   07/14/20 1600  remdesivir 200 mg in sodium chloride 0.9% 250 mL IVPB       "Followed by" Linked Group Details   200 mg 580 mL/hr over 30 Minutes Intravenous Once 07/14/20 1451 07/14/20 1900   07/14/20 1500  remdesivir 200 mg in sodium chloride 0.9% 250 mL IVPB  Status:  Discontinued       "Followed by" Linked Group Details   200 mg 580 mL/hr over 30 Minutes Intravenous Once  07/14/20 1449 07/14/20 1451       Time Spent in minutes  30   Susa Raring M.D on 07/15/2020 at 11:08 AM  To page go to [email or web address]   [facility] -  Office  [phone]  See all Orders from today for further details    Objective:   Vitals:   07/15/20 0300 07/15/20 0400 07/15/20 0600 07/15/20 0754  BP: 104/77 107/89 (!) 135/97 (!) 124/94  Pulse: 100 (!) 108 98 90  Resp: (!) 21 17 16 16   Temp:  97.7 F (36.5 C) 98 F (36.7 C) 98 F (36.7 C)  TempSrc:  Oral Oral Oral  SpO2: 94% 97% 96% 95%  Weight:      Height:        Wt Readings from Last 3 Encounters:  07/14/20 79.4 kg     Intake/Output Summary (Last 24 hours) at 07/15/2020 1108 Last data filed at 07/15/2020 0926 Gross per 24 hour  Intake 5125.97 ml  Output 1250 ml  Net 3875.97 ml     Physical Exam  Awake Alert, No new F.N deficits, Chr L.sided weakness Leg >> arm [place].AT,PERRAL Supple Neck,No JVD, No cervical lymphadenopathy appriciated.  Symmetrical Chest wall movement, Good air movement bilaterally, CTAB RRR,No Gallops,Rubs or new Murmurs, No Parasternal Heave +ve B.Sounds, Abd Soft, No tenderness, No organomegaly appriciated, No rebound - guarding or rigidity. No Cyanosis, Clubbing or edema, No new Rash or bruise      Data Review:    CBC Recent Labs  Lab 07/14/20 0701 07/15/20 0034  WBC 11.1* 10.1  HGB 13.9 11.3*  HCT 43.6 33.2*  PLT 533* 295  MCV 89.5 88.5  MCH 28.5 30.1  MCHC 31.9 34.0  RDW 13.0 13.1  LYMPHSABS  --  1.0  MONOABS  --  0.3  EOSABS  --  0.0  BASOSABS  --  0.0    Recent Labs  Lab 07/14/20 0701 07/14/20 1119 07/15/20 0034  NA 136  --  139  K 3.8  --  4.3  CL 94*  --  105  CO2 26  --  21*  GLUCOSE 118*  --  164*  BUN 60*  --  49*  CREATININE 4.80*  --  2.64*  CALCIUM 9.9  --  8.4*  AST  --   --  36  ALT  --   --  27  ALKPHOS  --   --  37*  BILITOT  --   --  0.5  ALBUMIN  --   --  2.9*  MG  --   --  1.8  CRP  --  4.1* 3.3*  DDIMER  --  8.92* 7.25*   PROCALCITON 0.18  --   --   LATICACIDVEN  --  1.4  --   BNP  --   --  6.5    ------------------------------------------------------------------------------------------------------------------ No results for input(s): CHOL, HDL, LDLCALC, TRIG, CHOLHDL, LDLDIRECT in the last 72 hours.  No results found for: HGBA1C ------------------------------------------------------------------------------------------------------------------ No results for input(s): TSH, T4TOTAL, T3FREE, THYROIDAB in the last 72 hours.  Invalid input(s): FREET3  Cardiac Enzymes No results for input(s): CKMB, TROPONINI, MYOGLOBIN in the last 168 hours.  Invalid input(s): CK ------------------------------------------------------------------------------------------------------------------    Component Value Date/Time   BNP 6.5 07/15/2020 0034    Micro Results Recent Results (from the past 240 hour(s))  Blood Culture (routine x 2)     Status: None (Preliminary result)   Collection Time: 07/14/20 11:19 AM   Specimen: BLOOD  Result Value Ref Range Status   Specimen Description BLOOD RIGHT ANTECUBITAL  Final   Special Requests   Final    BOTTLES DRAWN AEROBIC AND ANAEROBIC Blood Culture adequate volume   Culture   Final    NO GROWTH < 24 HOURS Performed at [facility], [address] [place], [place] [address]    Report Status PENDING  Incomplete  Blood Culture (routine x 2)     Status: None (Preliminary result)   Collection Time: 07/14/20  4:43 PM   Specimen: BLOOD RIGHT HAND  Result Value Ref Range Status   Specimen Description BLOOD RIGHT HAND  Final   Special Requests   Final    BOTTLES DRAWN AEROBIC ONLY Blood Culture results may not be optimal due to an inadequate volume of blood received in culture bottles   Culture   Final    NO GROWTH < 24 HOURS Performed at [facility], [address] [place], [place] [address]    Report Status PENDING  Incomplete  MRSA PCR Screening     Status:  None   Collection Time: 07/15/20  5:32 AM   Specimen: Nasal Mucosa; Nasopharyngeal  Result Value Ref Range Status   MRSA by PCR NEGATIVE NEGATIVE Final    Comment:        The GeneXpert MRSA Assay (FDA approved for NASAL specimens only), is one component of a comprehensive MRSA colonization surveillance program. It is not intended to diagnose MRSA infection nor to guide or monitor treatment for MRSA infections. Performed at [facility], [address] [place], [place] [address]     Radiology Reports DG Chest 2 View  Result Date: 07/05/2020 CLINICAL DATA:  Shortness of breath and fatigue EXAM: CHEST - 2 VIEW COMPARISON:  None. FINDINGS: Indistinct densities at the lung bases on the frontal view. No edema, effusion, or pneumothorax. Normal heart size and mediastinal contours. IMPRESSION: Mild infiltrate or atelectasis at the lung bases, lateral view and low volumes favoring atelectasis. Electronically Signed   By: Marnee Spring M.D.   On: 07/05/2020 10:26   CT Head Wo Contrast  Result Date: 07/05/2020 CLINICAL DATA:  Worsening left-sided weakness (history of CVA) also with history of nontraumatic ICH. EXAM: CT HEAD WITHOUT CONTRAST TECHNIQUE: Contiguous axial images were obtained from the base of the skull through the vertex without intravenous contrast. COMPARISON:  No pertinent prior exams available for comparison.  FINDINGS: Brain: Cerebral volume is normal for age. There is a subcentimeter hypodensity within the right thalamocapsular junction with subtle peripheral curvilinear hyperdensity (for instance as seen on series 3, images 15-17). There are chronic lacunar infarcts within the left corona radiata and left pons. No demarcated cortical infarct. No extra-axial fluid collection. No evidence of intracranial mass. No midline shift. Vascular: No hyperdense vessel.  Atherosclerotic calcifications Skull: Normal. Negative for fracture or focal lesion. Sinuses/Orbits: Visualized  orbits show no acute finding. No significant paranasal sinus disease at the imaged levels. These results were called by telephone at the time of interpretation on 07/05/2020 at 11:42 am to provider [facility] , who verbally acknowledged these results. IMPRESSION: Subcentimeter hypodensity within the right thalamocapsular junction with subtle peripheral curvilinear hyperdensity. This is favored to reflect subacute/chronic blood products at site of a subacute/chronic lacunar infarct. However, correlation with the patient's history and any available prior outside imaging is necessary. If this does not correlate with the patient's history and outside imaging, a contrast-enhanced brain MRI is recommended for further evaluation. Chronic lacunar infarcts within the left corona radiata and left basal ganglia. Electronically Signed   By: Jackey Loge DO   On: 07/05/2020 11:43   MR BRAIN WO CONTRAST  Result Date: 07/05/2020 CLINICAL DATA:  Neuro deficit, acute, stroke suspected History of thalamic ICH EXAM: MRI HEAD WITHOUT CONTRAST TECHNIQUE: Multiplanar, multiecho pulse sequences of the brain and surrounding structures were obtained without intravenous contrast. COMPARISON:  07/05/2020. FINDINGS: Brain: No focal restricted diffusion. Remote microhemorrhages involving the left thalamus, cerebellum and left basal ganglia. Sequela of remote insult involving the right thalamus with associated hemosiderin deposition. No midline shift, ventriculomegaly or extra-axial fluid collection. No mass lesion. Chronic left corona radiata and pontine lacunar insults. Minimal chronic microvascular ischemic changes. Vascular: Normal flow voids. Skull and upper cervical spine: Normal marrow signal. Sinuses/Orbits: Normal orbits. Pneumatized paranasal sinuses. Bilateral mastoid free fluid. Other: None. IMPRESSION: Sequela of prior right thalamic hemorrhage. No acute intracranial hemorrhage or infarct. Remote insults involving the left  corona radiata and pons. Sequela of hypertensive microangiopathy. Electronically Signed   By: Stana Bunting M.D.   On: 07/05/2020 16:14   US RENAL  Result Date: 07/14/2020 CLINICAL DATA:  Acute renal injury EXAM: RENAL / URINARY TRACT ULTRASOUND COMPLETE COMPARISON:  None. FINDINGS: Right Kidney: Renal measurements: 9.4 x 4.0 x 4.3 cm. = volume: 83 mL. Echogenicity within normal limits. No mass or hydronephrosis visualized. Left Kidney: Renal measurements: 10.8 x 5.2 x 4.2 cm. = volume: 124 mL. Echogenicity within normal limits. No mass or hydronephrosis visualized. Bladder: Appears normal for degree of bladder distention. Other: None. IMPRESSION: No acute abnormality noted. Electronically Signed   By: Alcide Clever M.D.   On: 07/14/2020 21:15   DG Chest Port 1 View  Result Date: 07/15/2020 CLINICAL DATA:  Shortness of breath EXAM: PORTABLE CHEST 1 VIEW COMPARISON:  07/14/2020 FINDINGS: Numerous leads and wires project over the chest. Midline trachea. Normal heart size. Atherosclerosis in the transverse aorta. No pleural effusion or pneumothorax. Clear lungs. IMPRESSION: No acute cardiopulmonary disease. Aortic Atherosclerosis (ICD10-I70.0). Electronically Signed   By: Jeronimo Greaves M.D.   On: 07/15/2020 09:08   DG Chest Portable 1 View  Result Date: 07/14/2020 CLINICAL DATA:  Dyspnea, COVID pneumonia EXAM: PORTABLE CHEST 1 VIEW COMPARISON:  07/05/2020 FINDINGS: The heart size and mediastinal contours are within normal limits. Both lungs are clear. The visualized skeletal structures are unremarkable. IMPRESSION: No active disease. Electronically Signed   By:  Helyn Numbers MD   On: 07/14/2020 10:43   VAS [name] LOWER EXTREMITY VENOUS (DVT)  Result Date: 07/15/2020  Lower Venous DVT Study Indications: Edema.  Comparison Study: no prior Performing Technologist: Blanch Media RVS  Examination Guidelines: A complete evaluation includes B-mode imaging, spectral Doppler, color Doppler, and power Doppler as  needed of all accessible portions of each vessel. Bilateral testing is considered an integral part of a complete examination. Limited examinations for reoccurring indications may be performed as noted. The reflux portion of the exam is performed with the patient in reverse Trendelenburg.  +---------+---------------+---------+-----------+----------+--------------+ RIGHT    CompressibilityPhasicitySpontaneityPropertiesThrombus Aging +---------+---------------+---------+-----------+----------+--------------+ CFV      Full           Yes      Yes                                 +---------+---------------+---------+-----------+----------+--------------+ SFJ      Full                                                        +---------+---------------+---------+-----------+----------+--------------+ FV Prox  Full                                                        +---------+---------------+---------+-----------+----------+--------------+ FV Mid   Full                                                        +---------+---------------+---------+-----------+----------+--------------+ FV DistalFull                                                        +---------+---------------+---------+-----------+----------+--------------+ PFV      Full                                                        +---------+---------------+---------+-----------+----------+--------------+ POP      Full           Yes      Yes                                 +---------+---------------+---------+-----------+----------+--------------+ PTV      Full                                                        +---------+---------------+---------+-----------+----------+--------------+ PERO     Full                                                        +---------+---------------+---------+-----------+----------+--------------+    +---------+---------------+---------+-----------+----------+--------------+  LEFT     CompressibilityPhasicitySpontaneityPropertiesThrombus Aging +---------+---------------+---------+-----------+----------+--------------+ CFV      Full           Yes      Yes                                 +---------+---------------+---------+-----------+----------+--------------+ SFJ      Full                                                        +---------+---------------+---------+-----------+----------+--------------+ FV Prox  Full                                                        +---------+---------------+---------+-----------+----------+--------------+ FV Mid   Full                                                        +---------+---------------+---------+-----------+----------+--------------+ FV DistalFull                                                        +---------+---------------+---------+-----------+----------+--------------+ PFV      Full                                                        +---------+---------------+---------+-----------+----------+--------------+ POP      Full           Yes      Yes                                 +---------+---------------+---------+-----------+----------+--------------+ PTV      Full                                                        +---------+---------------+---------+-----------+----------+--------------+ PERO     Full                                                        +---------+---------------+---------+-----------+----------+--------------+     Summary: BILATERAL: - No evidence of deep vein thrombosis seen in the lower extremities, bilaterally. - No evidence of superficial venous thrombosis in the lower extremities, bilaterally. -No evidence of popliteal cyst, bilaterally.   *See table(s) above for measurements and observations.  Preliminary

## 2020-07-15 NOTE — Progress Notes (Signed)
Lower extremity venous has been completed.   Preliminary results in CV Proc.   Blanch Media 07/15/2020 10:01 AM

## 2020-07-15 NOTE — Evaluation (Signed)
Occupational Therapy Evaluation Patient Details Name: Shirley Hansen MRN: [id number] DOB: 1966-03-18 Today's Date: 07/15/2020    History of Present Illness 55 y.o. female with medical history significant of CVA with left-sided deficits; HTN; and stage 3a CKD presenting with worsening COVID symptoms, positive on 1/24.   Clinical Impression   Pt admitted with above. She demonstrates the below listed deficits and will benefit from continued OT to maximize safety and independence with BADLs.  Pt presents to OT with Lt hemiparesis from previous CVA, impaired balance, increased pain, decreased activity tolerance.  She currently requires set up assist - total A for ADLs and max A for functional mobility.  She typically lives with her son, and is able to perform ADLs mod I - min A, and is mod I for functional mobility.  She has experienced a significant decline in function since acquiring COVID.  Recommend SNF level rehab at discharge.       Follow Up Recommendations  SNF    Equipment Recommendations  None recommended by OT    Recommendations for Other Services       Precautions / Restrictions Precautions Precautions: Fall      Mobility Bed Mobility               General bed mobility comments: Pt sitting up in recliner    Transfers Overall transfer level: Needs assistance Equipment used: 1 person hand held assist Transfers: Sit to/from Stand;Stand Pivot Transfers Sit to Stand: Max assist              Balance Overall balance assessment: Needs assistance Sitting-balance support: Feet supported;Single extremity supported Sitting balance-Leahy Scale: Fair Sitting balance - Comments: able to maintain unsupported sitting with min guard assist                                   ADL either performed or assessed with clinical judgement   ADL Overall ADL's : Needs assistance/impaired Eating/Feeding: Modified independent;Sitting   Grooming: Wash/dry hands;Wash/dry  face;Oral care;Brushing hair;Set up;Sitting   Upper Body Bathing: Minimal assistance;Sitting   Lower Body Bathing: Maximal assistance;Sit to/from stand Lower Body Bathing Details (indicate cue type and reason): limited by LE pain Upper Body Dressing : Sitting;Minimal assistance   Lower Body Dressing: Total assistance;Sit to/from stand Lower Body Dressing Details (indicate cue type and reason): limited by LE pain Toilet Transfer: Maximal assistance;BSC   Toileting- Clothing Manipulation and Hygiene: Maximal assistance;Sit to/from stand       Functional mobility during ADLs: Maximal assistance       Vision Patient Visual Report: No change from baseline       Perception     Praxis      Pertinent Vitals/Pain Pain Assessment: Faces Faces Pain Scale: Hurts even more Pain Location: Lt LE Pain Descriptors / Indicators: Aching;Shooting;Tender Pain Intervention(s): Patient requesting pain meds-RN notified;RN gave pain meds during session     Hand Dominance Right   Extremity/Trunk Assessment Upper Extremity Assessment Upper Extremity Assessment: LUE deficits/detail LUE Deficits / Details: Pt with residual Lt UE weakness with flexor spasticity.  She demonstrates full flex/ext of hand.  when she attempt to initiate elbow extension, she pulls into flexion, but is able to perform AAROM of elbow flex/ext and shoulder flex/ext LUE Coordination: decreased fine motor;decreased gross motor   Lower Extremity Assessment Lower Extremity Assessment: Defer to PT evaluation   Cervical / Trunk Assessment Cervical / Trunk Assessment: Other exceptions  Cervical / Trunk Exceptions: Lt scap elevated.  Lt rib cage flared   Communication Communication Communication: No difficulties   Cognition Arousal/Alertness: Awake/alert Behavior During Therapy: WFL for tasks assessed/performed Overall Cognitive Status: No family/caregiver present to determine baseline cognitive functioning                                  General Comments: Appears WFL for basic tasks.  Pt verbose and self distracts.  Requires redirection.   General Comments  VSS during eval    Exercises     Shoulder Instructions      Home Living Family/patient expects to be discharged to:: Private residence Living Arrangements: Children;Other relatives Available Help at Discharge: Family;Available 24 hours/day Type of Home: House Home Access: Level entry     Home Layout: One level     Bathroom Shower/Tub: Chief Strategy Officer: Standard     Home Equipment: Environmental consultant - 2 wheels;Shower seat;Bedside commode;Wheelchair - Careers adviser (comment)          Prior Functioning/Environment Level of Independence: Needs assistance  Gait / Transfers Assistance Needed: mod I household distances with RW vs hemiwalker ADL's / Homemaking Assistance Needed: family assists with iADLs and transfer in/out tub/shower for bathing            OT Problem List: Decreased strength;Decreased range of motion;Decreased activity tolerance;Impaired balance (sitting and/or standing);Decreased coordination;Decreased cognition;Decreased safety awareness;Decreased knowledge of use of DME or AE;Obesity;Impaired UE functional use;Impaired tone;Pain      OT Treatment/Interventions: Self-care/ADL training;Neuromuscular education;DME and/or AE instruction;Therapeutic activities;Cognitive remediation/compensation;Patient/family education;Balance training;Manual therapy    OT Goals(Current goals can be found in the care plan section) Acute Rehab OT Goals Patient Stated Goal: to have less pain, and to regain strength OT Goal Formulation: With patient Time For Goal Achievement: 07/29/20 Potential to Achieve Goals: Good ADL Goals Pt Will Perform Grooming: with min assist;standing Pt Will Perform Upper Body Bathing: with supervision;with set-up;sitting Pt Will Perform Lower Body Bathing: with mod assist;sit to/from  stand Pt Will Perform Upper Body Dressing: with set-up;with supervision;sitting Pt Will Perform Lower Body Dressing: with mod assist;sit to/from stand Pt Will Transfer to Toilet: with mod assist;stand pivot transfer;bedside commode Pt Will Perform Toileting - Clothing Manipulation and hygiene: with mod assist;sit to/from stand  OT Frequency: Min 2X/week   Barriers to D/C: Decreased caregiver support          Co-evaluation              AM-PAC OT "6 Clicks" Daily Activity     Outcome Measure Help from another person eating meals?: None Help from another person taking care of personal grooming?: A Little Help from another person toileting, which includes using toliet, bedpan, or urinal?: A Lot Help from another person bathing (including washing, rinsing, drying)?: A Lot Help from another person to put on and taking off regular upper body clothing?: A Little Help from another person to put on and taking off regular lower body clothing?: Total 6 Click Score: 15   End of Session Nurse Communication: Mobility status  Activity Tolerance: Patient limited by pain;Patient limited by fatigue Patient left: in chair;with call bell/phone within reach  OT Visit Diagnosis: Unsteadiness on feet (R26.81);Hemiplegia and hemiparesis;Pain Hemiplegia - Right/Left: Left Hemiplegia - dominant/non-dominant: Non-Dominant Hemiplegia - caused by: Cerebral infarction Pain - Right/Left: Left Pain - part of body: Leg  Time: 1324-4010 OT Time Calculation (min): 18 min Charges:  OT General Charges $OT Visit: 1 Visit OT Evaluation $OT Eval Moderate Complexity: 1 Mod  Eber Jones., OTR/L Acute Rehabilitation Services Pager [phone] Office [phone]   Jeani Hawking M 07/15/2020, 4:12 PM

## 2020-07-15 NOTE — Progress Notes (Signed)
ANTICOAGULATION CONSULT NOTE  Pharmacy Consult for heparin Indication: rule out pulmonary embolus in setting of COVID infection and elevated d-dimer  No Known Allergies  PLabs: Recent Labs    07/14/20 0701 07/14/20 2200 07/15/20 0034 07/15/20 0556  HGB 13.9  --  11.3*  --   HCT 43.6  --  33.2*  --   PLT 533*  --  295  --   HEPARINUNFRC  --  0.82* 0.76* 0.99*  CREATININE 4.80*  --  2.64*  --     Assessment: 55 yo admitted with weakness, blood pressure 80-90's on arrival.  Previous COVID infection diagnosed 07/05/20.  CXR unremarkable.  D-dimer elevated at 8.92 and VQ scan pending.  Patient does have elevated creatinine (today 4.8 while previously 1.53 in January 2022). Pharmacy consulted to dose heparin empirically while ruling out PE due to patient being high risk with recent COVID infection and elevated d-dimer.    Heparin level remains supratherapeutic (0.99) on gtt at 900 units/hr - drawn only about 6 hours post rate change but level still trending up. No issues with line or bleeding reported per RN.  Goal of Therapy:  Heparin level 0.3-0.7 units/ml Monitor platelets by anticoagulation protocol: Yes   Plan:  Decrease heparin to 700 units/hr F/u 8 hr heparin level  Thanks for allowing pharmacy to be a part of this patient's care.  Christoper Fabian, PharmD, BCPS Please see amion for complete clinical pharmacist phone list 07/15/2020 6:45 AM

## 2020-07-15 NOTE — Progress Notes (Signed)
Initial Nutrition Assessment  DOCUMENTATION CODES:   Obesity unspecified  INTERVENTION:   Ensure Enlive po BID, each supplement provides 350 kcal and 20 grams of protein  MVI with minerals daily   NUTRITION DIAGNOSIS:   Increased nutrient needs related to acute illness (COVID-19) as evidenced by estimated needs.    GOAL:   Patient will meet greater than or equal to 90% of their needs    MONITOR:   PO intake,Supplement acceptance,Weight trends,I & O's,Skin,Labs  REASON FOR ASSESSMENT:   Malnutrition Screening Tool    ASSESSMENT:   Pt admitted with acute respiratory failure wit hypoxia 2/2 COVID-19 PNA. PMH includes h/o stroke, HTN, stage 3 CKD.  Pt reports that she had not been eating or drinking much since being diagnosed with COVID on 1/24. However, this has since improved and pt has eaten 100% x2 recorded meals since admit. Will provide pt with oral nutrition supplements given increased nutrient needs.   Reviewed weight history. Per Care Everywhere, pt weighed 82.6 kg on 05/21/20. Pt now weighs 79.4 kg. This indicates a 3.4% loss x 2 months, which is insignificant for time frame.   UOP: x24 hours  Labs: Cr 2.64 (H, down from yesterday) Medications: vitamin c, solu-medrol, zinc sulfate  NUTRITION - FOCUSED PHYSICAL EXAM:  Flowsheet Row Most Recent Value  Orbital Region No depletion  Upper Arm Region No depletion  Thoracic and Lumbar Region No depletion  Buccal Region No depletion  Temple Region No depletion  Clavicle Bone Region No depletion  Clavicle and Acromion Bone Region No depletion  Scapular Bone Region No depletion  Dorsal Hand No depletion  Patellar Region No depletion  Anterior Thigh Region No depletion  Posterior Calf Region No depletion  Edema (RD Assessment) None  Hair Reviewed  Eyes Reviewed  Mouth Reviewed  Skin Reviewed  Nails Reviewed       Diet Order:   Diet Order            Diet regular Room service appropriate? Yes;  Fluid consistency: Thin  Diet effective now                 EDUCATION NEEDS:   No education needs have been identified at this time  Skin:  Skin Assessment: Skin Integrity Issues: Skin Integrity Issues:: Other (Comment) Other: unstaged pressure injury noted to L elbow per RN assessment  Last BM:  PTA  Height:   Ht Readings from Last 1 Encounters:  07/14/20 5\' 2"  (1.575 m)    Weight:   Wt Readings from Last 1 Encounters:  07/14/20 79.4 kg    Ideal Body Weight:  50 kg  BMI:  Body mass index is 32.01 kg/m.  Estimated Nutritional Needs:   Kcal:  1800-2000  Protein:  90-100 grams  Fluid:  >1.8L    09/11/20, MS, RD, LDN RD pager number and weekend/on-call pager number located in Amion.

## 2020-07-15 NOTE — Progress Notes (Signed)
ANTICOAGULATION CONSULT NOTE  Pharmacy Consult for heparin Indication: rule out pulmonary embolus in setting of COVID infection and elevated d-dimer  No Known Allergies  PLabs: Recent Labs    07/14/20 0701 07/14/20 2200 07/15/20 0034 07/15/20 0556 07/15/20 1557  HGB 13.9  --  11.3*  --   --   HCT 43.6  --  33.2*  --   --   PLT 533*  --  295  --   --   HEPARINUNFRC  --    < > 0.76* 0.99* 0.69  CREATININE 4.80*  --  2.64*  --   --    < > = values in this interval not displayed.    Assessment: 54 yo admitted with weakness, blood pressure 80-90's on arrival.  Previous COVID infection diagnosed 07/05/20.  CXR unremarkable.  D-dimer elevated at 8.92 and VQ scan pending.  Patient does have elevated creatinine (today 4.8 while previously 1.53 in January 2022). Pharmacy consulted to dose heparin empirically while ruling out PE due to patient being high risk with recent COVID infection and elevated d-dimer.    Heparin level remains supratherapeutic (0.99) on gtt at 900 units/hr - drawn only about 6 hours post rate change but level still trending up. No issues with line or bleeding reported per RN.   Heparin level came back therapeutic this PM at 0.69. We will continue with the current rate and recheck in AM.   Goal of Therapy:  Heparin level 0.3-0.7 units/ml Monitor platelets by anticoagulation protocol: Yes   Plan:  Cont heparin 700 units/hr Daily heparin level and CBC  Ulyses Southward, PharmD, BCIDP, AAHIVP, CPP Infectious Disease Pharmacist 07/15/2020 5:10 PM

## 2020-07-16 DIAGNOSIS — I10 Essential (primary) hypertension: Secondary | ICD-10-CM | POA: Diagnosis not present

## 2020-07-16 DIAGNOSIS — R0602 Shortness of breath: Secondary | ICD-10-CM | POA: Diagnosis not present

## 2020-07-16 DIAGNOSIS — U071 COVID-19: Secondary | ICD-10-CM | POA: Diagnosis not present

## 2020-07-16 DIAGNOSIS — N179 Acute kidney failure, unspecified: Secondary | ICD-10-CM | POA: Diagnosis not present

## 2020-07-16 LAB — COMPREHENSIVE METABOLIC PANEL
ALT: 26 U/L (ref 0–44)
AST: 32 U/L (ref 15–41)
Albumin: 2.7 g/dL — ABNORMAL LOW (ref 3.5–5.0)
Alkaline Phosphatase: 44 U/L (ref 38–126)
Anion gap: 11 (ref 5–15)
BUN: 44 mg/dL — ABNORMAL HIGH (ref 6–20)
CO2: 23 mmol/L (ref 22–32)
Calcium: 9.2 mg/dL (ref 8.9–10.3)
Chloride: 106 mmol/L (ref 98–111)
Creatinine, Ser: 1.55 mg/dL — ABNORMAL HIGH (ref 0.44–1.00)
GFR, Estimated: 40 mL/min — ABNORMAL LOW (ref >60)
Glucose, Bld: 113 mg/dL — ABNORMAL HIGH (ref 70–99)
Potassium: 4.1 mmol/L (ref 3.5–5.1)
Sodium: 140 mmol/L (ref 135–145)
Total Bilirubin: 0.2 mg/dL — ABNORMAL LOW (ref 0.3–1.2)
Total Protein: 5.8 g/dL — ABNORMAL LOW (ref 6.5–8.1)

## 2020-07-16 LAB — CBC WITH DIFFERENTIAL/PLATELET
Abs Immature Granulocytes: 0.3 10*3/uL — ABNORMAL HIGH (ref 0.00–0.07)
Basophils Absolute: 0 10*3/uL (ref 0.0–0.1)
Basophils Relative: 0 %
Eosinophils Absolute: 0 10*3/uL (ref 0.0–0.5)
Eosinophils Relative: 0 %
HCT: 32.1 % — ABNORMAL LOW (ref 36.0–46.0)
Hemoglobin: 10.5 g/dL — ABNORMAL LOW (ref 12.0–15.0)
Immature Granulocytes: 2 %
Lymphocytes Relative: 8 %
Lymphs Abs: 1.6 10*3/uL (ref 0.7–4.0)
MCH: 29.1 pg (ref 26.0–34.0)
MCHC: 32.7 g/dL (ref 30.0–36.0)
MCV: 88.9 fL (ref 80.0–100.0)
Monocytes Absolute: 1.5 10*3/uL — ABNORMAL HIGH (ref 0.1–1.0)
Monocytes Relative: 8 %
Neutro Abs: 16.3 10*3/uL — ABNORMAL HIGH (ref 1.7–7.7)
Neutrophils Relative %: 82 %
Platelets: 438 10*3/uL — ABNORMAL HIGH (ref 150–400)
RBC: 3.61 MIL/uL — ABNORMAL LOW (ref 3.87–5.11)
RDW: 13 % (ref 11.5–15.5)
WBC: 19.8 10*3/uL — ABNORMAL HIGH (ref 4.0–10.5)
nRBC: 0 % (ref 0.0–0.2)

## 2020-07-16 LAB — MAGNESIUM: Magnesium: 1.7 mg/dL (ref 1.7–2.4)

## 2020-07-16 LAB — URINE CULTURE: Culture: NO GROWTH

## 2020-07-16 LAB — BRAIN NATRIURETIC PEPTIDE: B Natriuretic Peptide: 267.7 pg/mL — ABNORMAL HIGH (ref 0.0–100.0)

## 2020-07-16 LAB — D-DIMER, QUANTITATIVE: D-Dimer, Quant: 3.96 ug/mL-FEU — ABNORMAL HIGH (ref 0.00–0.50)

## 2020-07-16 LAB — C-REACTIVE PROTEIN: CRP: 2.5 mg/dL — ABNORMAL HIGH (ref <1.0)

## 2020-07-16 LAB — HEPARIN LEVEL (UNFRACTIONATED): Heparin Unfractionated: 0.86 IU/mL — ABNORMAL HIGH (ref 0.30–0.70)

## 2020-07-16 MED ORDER — LORATADINE 10 MG PO TABS
10.0000 mg | ORAL_TABLET | Freq: Every day | ORAL | Status: DC
  Administered 2020-07-16 – 2020-07-28 (×13): 10 mg via ORAL
  Filled 2020-07-16 (×13): qty 1

## 2020-07-16 MED ORDER — GABAPENTIN 600 MG PO TABS
300.0000 mg | ORAL_TABLET | Freq: Two times a day (BID) | ORAL | Status: DC
  Administered 2020-07-16 – 2020-07-28 (×25): 300 mg via ORAL
  Filled 2020-07-16 (×26): qty 1

## 2020-07-16 MED ORDER — ENOXAPARIN SODIUM 40 MG/0.4ML ~~LOC~~ SOLN
40.0000 mg | Freq: Two times a day (BID) | SUBCUTANEOUS | Status: DC
  Administered 2020-07-16 – 2020-07-22 (×13): 40 mg via SUBCUTANEOUS
  Filled 2020-07-16 (×13): qty 0.4

## 2020-07-16 MED ORDER — HYDROCORTISONE 1 % EX CREA
TOPICAL_CREAM | Freq: Two times a day (BID) | CUTANEOUS | Status: DC
  Administered 2020-07-16 – 2020-07-25 (×7): 1 via TOPICAL
  Filled 2020-07-16 (×4): qty 28

## 2020-07-16 NOTE — TOC Initial Note (Signed)
Transition of Care [facility]) - Initial/Assessment Note    Patient Details  Name: Shirley Hansen MRN: [id number] Date of Birth: 05-07-1966  [facility] CM/SW Contact:    Mearl Latin, LCSW Phone Number: 07/16/2020, 3:09 PM  Clinical Narrative:                 CSW received consult for possible SNF placement at time of discharge. CSW spoke with patient. Patient reported that she normally lives with her son and daughter in law in [place] (address correct on facesheet) but is currently with her niece in [place], where she got COVID. She was completing outpatient PT at [facility] but has been unable to here in [place]. Family is unable to care for patient at their home given patient's current physical needs and fall risk. Patient expressed understanding of PT recommendation and is agreeable to SNF placement at time of discharge. CSW discussed insurance authorization process and that patient may have to sign over her Disability check for the month. Patient reported agreement. Patient has only received one dose of the [employer] vaccines. She is aware of the placement barriers (COVID+ facilities not accepting Medicaid). Patient expressed being hopeful for rehab and to feel better soon. No further questions reported at this time.    Expected Discharge Plan: Skilled Nursing Facility Barriers to Discharge: SNF Pending bed offer,SNF Covid   Patient Goals and CMS Choice Patient states their goals for this hospitalization and ongoing recovery are:: Rehab CMS Medicare.gov Compare Post Acute Care list provided to:: Patient Choice offered to / list presented to : Patient  Expected Discharge Plan and Services Expected Discharge Plan: Skilled Nursing Facility In-house Referral: Clinical Social Work   Post Acute Care Choice: Skilled Nursing Facility Living arrangements for the past 2 months: Apartment,Single Family Home                                      Prior Living  Arrangements/Services Living arrangements for the past 2 months: Apartment,Single Family Home Lives with:: Adult Children Patient language and need for interpreter reviewed:: Yes Do you feel safe going back to the place where you live?: Yes      Need for Family Participation in Patient Care: No (Comment) Care giver support system in place?: Yes (comment) Current home services: DME (walker) Criminal Activity/Legal Involvement Pertinent to Current Situation/Hospitalization: No - Comment as needed  Activities of Daily Living Home Assistive Devices/Equipment: Dan Humphreys (specify type) ADL Screening (condition at time of admission) Patient's cognitive ability adequate to safely complete daily activities?: Yes Is the patient deaf or have difficulty hearing?: No Does the patient have difficulty seeing, even when wearing glasses/contacts?: Yes Does the patient have difficulty concentrating, remembering, or making decisions?: Yes Patient able to express need for assistance with ADLs?: Yes Does the patient have difficulty dressing or bathing?: Yes Independently performs ADLs?: No Does the patient have difficulty walking or climbing stairs?: Yes Weakness of Legs: Left Weakness of Arms/Hands: Left  Permission Sought/Granted Permission sought to share information with : Facility Industrial/product designer granted to share information with : Yes, Verbal Permission Granted     Permission granted to share info w AGENCY: SNFs        Emotional Assessment   Attitude/Demeanor/Rapport: Crying,Gracious Affect (typically observed): Accepting,Appropriate,Tearful/Crying Orientation: : Oriented to Self,Oriented to Place,Oriented to  Time,Oriented to Situation Alcohol / Substance Use: Not Applicable Psych Involvement: No (comment)  Admission diagnosis:  AKI (acute kidney injury) (HCC) [N17.9] COVID-19 [U07.1] Patient Active Problem List   Diagnosis Date Noted  . COVID-19 07/14/2020  . Stroke  (HCC)   . Stage 3a chronic kidney disease (HCC)   . Hypertension    PCP:  Pcp, No Pharmacy:   [facility] [place] - [address] [address] Phone: [phone] Fax: [phone]     Social Determinants of Health (SDOH) Interventions    Readmission Risk Interventions No flowsheet data found.

## 2020-07-16 NOTE — Progress Notes (Signed)
ANTICOAGULATION CONSULT NOTE  Pharmacy Consult for heparin Indication: rule out pulmonary embolus in setting of COVID infection and elevated d-dimer  No Known Allergies  PLabs: Recent Labs    07/14/20 0701 07/14/20 2200 07/15/20 0034 07/15/20 0556 07/15/20 1557 07/16/20 0205  HGB 13.9  --  11.3*  --   --  10.5*  HCT 43.6  --  33.2*  --   --  32.1*  PLT 533*  --  295  --   --  438*  HEPARINUNFRC  --    < > 0.76* 0.99* 0.69 0.86*  CREATININE 4.80*  --  2.64*  --   --   --    < > = values in this interval not displayed.    Assessment: 55 yo admitted with weakness, blood pressure 80-90's on arrival.  Previous COVID infection diagnosed 07/05/20.  CXR unremarkable.  D-dimer elevated at 8.92.  Patient does have elevated creatinine (today 4.8 while previously 1.53 in January 2022). Pharmacy consulted to dose heparin empirically while ruling out PE due to patient being high risk with recent COVID infection and elevated d-dimer.    Heparin level now back to supratherapeutic (0.86). Prior pharmacist wanted to decrease gtt to 700 units/hr but appears that order was not entered so gtt still running at 900 units/hr. No bleeding noted.  **VQ scan negative for PE** **LE ultrasound negative for DVT**  Goal of Therapy:  Heparin level 0.3-0.7 units/ml Monitor platelets by anticoagulation protocol: Yes   Plan:  Decrease heparin infusion to 750 units/hr With negative studies, will d/w rounding MD in a.m. and hopefully can stop full-dose [facility]  Christoper Fabian, PharmD, BCPS Please see amion for complete clinical pharmacist phone list 07/16/2020 3:25 AM

## 2020-07-16 NOTE — [place] FL2 (Signed)
[place] MEDICAID FL2 LEVEL OF CARE SCREENING TOOL     IDENTIFICATION  Patient Name: Shirley Hansen Birthdate: 03-04-1966 Sex: female Admission Date (Current Location): 07/14/2020  [facility] and [place] Number:  Producer, television/film/video and Address:  [facility] [place], [address], [place], [place] [address]      Provider Number: [id number]  Attending Physician Name and Address:  Leroy Sea, MD  Relative Name and Phone Number:  Ernestine Mcmurray (niece) [phone]    Current Level of Care: Hospital Recommended Level of Care: Skilled Nursing Facility Prior Approval Number:    Date Approved/Denied:   PASRR Number: [id number]  Discharge Plan: SNF    Current Diagnoses: Patient Active Problem List   Diagnosis Date Noted  . COVID-19 07/14/2020  . Stroke (HCC)   . Stage 3a chronic kidney disease (HCC)   . Hypertension     Orientation RESPIRATION BLADDER Height & Weight     Self,Time,Situation,Place  Normal Incontinent,External catheter Weight: 175 lb (79.4 kg) Height:  5\' 2"  (157.5 cm)  BEHAVIORAL SYMPTOMS/MOOD NEUROLOGICAL BOWEL NUTRITION STATUS      Continent Diet (See DC summary)  AMBULATORY STATUS COMMUNICATION OF NEEDS Skin   Limited Assist Verbally Other (Comment) (Pressure injury on left posterior elbow)                       Personal Care Assistance Level of Assistance  Bathing,Feeding,Dressing Bathing Assistance: Maximum assistance Feeding assistance: Independent Dressing Assistance: Maximum assistance     Functional Limitations Info  Sight,Hearing,Speech Sight Info: Impaired Hearing Info: Adequate Speech Info: Adequate    SPECIAL CARE FACTORS FREQUENCY  PT (By licensed PT),OT (By licensed OT)     PT Frequency: 2X per week OT Frequency: 2X per week            Contractures      Additional Factors Info  Code Status,Allergies,Isolation Precautions Code Status Info: FULL Allergies Info: NKA     Isolation Precautions Info:  COVID-19 positive 1/24     Current Medications (07/16/2020):  This is the current hospital active medication list Current Facility-Administered Medications  Medication Dose Route Frequency Provider Last Rate Last Admin  . acetaminophen (TYLENOL) tablet 650 mg  650 mg Oral Q6H PRN 09/13/2020, MD   650 mg at 07/15/20 2257  . albuterol (VENTOLIN HFA) 108 (90 Base) MCG/ACT inhaler 2 puff  2 puff Inhalation Q2H PRN 09/12/20, MD      . ascorbic acid (VITAMIN C) tablet 500 mg  500 mg Oral Daily Jonah Blue, MD   500 mg at 07/16/20 09/13/20  . aspirin EC tablet 81 mg  81 mg Oral Daily [id number], MD   81 mg at 07/16/20 0907  . atorvastatin (LIPITOR) tablet 40 mg  40 mg Oral 0908, MD   40 mg at 07/15/20 2257  . bisacodyl (DULCOLAX) EC tablet 5 mg  5 mg Oral Daily PRN 09/12/20, MD      . brimonidine (ALPHAGAN) 0.2 % ophthalmic solution 1 drop  1 drop Both Eyes BID Jonah Blue, MD   1 drop at 07/16/20 0911  . calcium carbonate (dosed in mg elemental calcium) suspension 500 mg of elemental calcium  500 mg of elemental calcium Oral Q6H PRN 09/13/20, MD      . camphor-menthol [facility]) lotion 1 application  1 application Topical Q8H PRN [facility], MD       And  . hydrOXYzine (ATARAX/VISTARIL) tablet 25 mg  25 mg Oral Q8H PRN Jonah Blue, MD      . cefTRIAXone (ROCEPHIN) 1 g in sodium chloride 0.9 % 100 mL IVPB  1 g Intravenous Q24H Susa Raring K, MD 200 mL/hr at 07/16/20 1144 1 g at 07/16/20 1144  . chlorpheniramine-HYDROcodone (TUSSIONEX) 10-8 MG/5ML suspension 5 mL  5 mL Oral Q12H PRN Jonah Blue, MD      . diazepam (VALIUM) injection 5 mg  5 mg Intravenous Once Jonah Blue, MD      . docusate sodium [facility]) enema 283 mg  1 enema Rectal PRN Jonah Blue, MD      . dorzolamide-timolol (COSOPT) 22.3-6.8 MG/ML ophthalmic solution 1 drop  1 drop Both Eyes BID Jonah Blue, MD   1 drop at 07/16/20 0911  . enoxaparin (LOVENOX) injection  40 mg  40 mg Subcutaneous Q12H Leroy Sea, MD   40 mg at 07/16/20 1138  . feeding supplement (ENSURE ENLIVE / ENSURE PLUS) liquid 237 mL  237 mL Oral BID BM Leroy Sea, MD   237 mL at 07/15/20 1556  . feeding supplement (NEPRO CARB STEADY) liquid 237 mL  237 mL Oral TID PRN Jonah Blue, MD      . gabapentin (NEURONTIN) tablet 300 mg  300 mg Oral BID Leroy Sea, MD   300 mg at 07/16/20 1138  . guaiFENesin-dextromethorphan (ROBITUSSIN DM) 100-10 MG/5ML syrup 10 mL  10 mL Oral Q4H PRN Jonah Blue, MD      . hydrocortisone cream 1 %   Topical BID Leroy Sea, MD   Given at 07/16/20 [phone]  . latanoprost (XALATAN) 0.005 % ophthalmic solution 1 drop  1 drop Both Eyes QHS Jonah Blue, MD   1 drop at 07/15/20 2257  . loratadine (CLARITIN) tablet 10 mg  10 mg Oral Daily Leroy Sea, MD   10 mg at 07/16/20 0907  . methylPREDNISolone sodium succinate (SOLU-MEDROL) 40 mg/mL injection 40 mg  40 mg Intravenous Daily Leroy Sea, MD   40 mg at 07/16/20 0907  . multivitamin with minerals tablet 1 tablet  1 tablet Oral Daily Leroy Sea, MD   1 tablet at 07/16/20 0907  . ondansetron (ZOFRAN) injection 4 mg  4 mg Intravenous Q6H PRN Jonah Blue, MD      . oxyCODONE (Oxy IR/ROXICODONE) immediate release tablet 5 mg  5 mg Oral Q4H PRN Jonah Blue, MD   5 mg at 07/16/20 1139  . pantoprazole (PROTONIX) EC tablet 40 mg  40 mg Oral Daily Leroy Sea, MD   40 mg at 07/16/20 0907  . pneumococcal 23 valent vaccine (PNEUMOVAX-23) injection 0.5 mL  0.5 mL Intramuscular Tomorrow-1000 Jonah Blue, MD      . polyethylene glycol (MIRALAX / GLYCOLAX) packet 17 g  17 g Oral Daily PRN Jonah Blue, MD      . remdesivir 100 mg in sodium chloride 0.9 % 100 mL IVPB  100 mg Intravenous Daily Jonah Blue, MD 200 mL/hr at 07/16/20 0921 100 mg at 07/16/20 0921  . sorbitol 70 % solution 30 mL  30 mL Oral PRN Jonah Blue, MD      . zinc sulfate capsule 220 mg   220 mg Oral Daily Jonah Blue, MD   220 mg at 07/16/20 0907  . zolpidem (AMBIEN) tablet 5 mg  5 mg Oral QHS PRN Jonah Blue, MD         Discharge Medications: Please see discharge summary for a list of discharge medications.  Relevant Imaging Results:  Relevant Lab Results:   Additional Information SSN: [id number] Has only recieved one dose of the Pfizer vaccine  [place] Pink, Student-Social Work

## 2020-07-16 NOTE — Progress Notes (Signed)
PROGRESS NOTE                                                                                                                                                                                                             Patient Demographics:    Shirley Hansen, is a 55 y.o. female, DOB - 02-05-1966, UJW:[id number]  Outpatient Primary MD for the patient is Pcp, No    LOS - 2  Admit date - 07/14/2020    Chief Complaint  Patient presents with  . Back Pain  . Weakness    Covid        Brief Narrative (HPI from H&P)  - Khalidah Kilbourne is a 55 y.o. female with medical history significant of CVA with left-sided deficits; HTN; and stage 3a CKD presenting with worsening COVID symptoms, positive on 1/24.  She was seen in the ER on 1/24 for progressive and generalized weakness, she had no shortness of breath her main symptom was generalized weakness, in the ER her work-up was consistent with severe dehydration she was found to have extremely high D-dimer, AKI, hypotension and she was admitted   Subjective:   Patient in bed, appears comfortable, denies any headache, no fever, no chest pain or pressure, no shortness of breath , no abdominal pain. No focal weakness.   Assessment  & Plan :   1. Acute COVID-19 infection - she has received 1 shot of Pfizer vaccine recently, she has minimal pulmonary involvement and symptoms are more generalized with severe dehydration and AKI.  CRP is borderline but her D-dimer is extremely elevated.  She will be treated with IV fluids, low-dose steroids and Remdesivir.  With rapid tapering of steroids.  Encouraged the patient to sit up in chair in the daytime use I-S and flutter valve for pulmonary toiletry and then prone in bed when at night.  Will advance activity and titrate down oxygen as possible.   SpO2: 93 %  Recent Labs  Lab 07/14/20 0701 07/14/20 1119 07/15/20 0034 07/16/20 0205  WBC 11.1*  --  10.1  19.8*  HGB 13.9  --  11.3* 10.5*  HCT 43.6  --  33.2* 32.1*  PLT 533*  --  295 438*  CRP  --  4.1* 3.3* 2.5*  BNP  --   --  6.5 267.7*  DDIMER  --  8.92*  7.25* 3.96*  PROCALCITON 0.18  --   --   --   AST  --   --  36 32  ALT  --   --  27 26  ALKPHOS  --   --  37* 44  BILITOT  --   --  0.5 0.2*  ALBUMIN  --   --  2.9* 2.7*  LATICACIDVEN  --  1.4  --   --     2.  Dehydration with AKI.  Hydrate with IV fluids avoid nephrotoxins.  She has underlying CKD 3A with baseline creatinine around 1.5.  3.  History of CVA with left-sided hemiparesis.  Supportive care.  Continue aspirin statin for secondary prevention, PT OT as needed.  4.  High D-dimer with severe dehydration.  High risk for developing a blood clot, leg ultrasound & VQ negative for now continue on moderate dose Lovenox.  5.  Obesity with BMI 32.  Follow with PCP for weight loss.  6.  Possible UTI.  3 days of Rocephin, follow cultures.      Condition -   Guarded  Family Communication  : Niece Ernestine Mcmurray [phone] on 07/15/2020 - wrong number  Code Status :  Full  Consults  :  PCCM  Procedures  :    Leg [name]  -ve  VQ Scan  -ve  PUD Prophylaxis : PPI  Disposition Plan  :    Status is: Inpatient  Remains inpatient appropriate because:IV treatments appropriate due to intensity of illness or inability to take PO   Dispo: The patient is from: Home              Anticipated d/c is to: Home              Anticipated d/c date is: > 3 days              Patient currently is not medically stable to d/c.   Difficult to place patient No   DVT Prophylaxis  :  Hep gtt >> Lovenox  Lab Results  Component Value Date   PLT 438 (H) 07/16/2020    Diet :  Diet Order            Diet regular Room service appropriate? Yes; Fluid consistency: Thin  Diet effective now                  Inpatient Medications  Scheduled Meds: . vitamin C  500 mg Oral Daily  . aspirin EC  81 mg Oral Daily  . atorvastatin  40 mg Oral QHS  .  brimonidine  1 drop Both Eyes BID  . diazepam  5 mg Intravenous Once  . dorzolamide-timolol  1 drop Both Eyes BID  . enoxaparin (LOVENOX) injection  40 mg Subcutaneous Q12H  . feeding supplement  237 mL Oral BID BM  . hydrocortisone cream   Topical BID  . latanoprost  1 drop Both Eyes QHS  . loratadine  10 mg Oral Daily  . methylPREDNISolone (SOLU-MEDROL) injection  40 mg Intravenous Daily  . multivitamin with minerals  1 tablet Oral Daily  . pantoprazole  40 mg Oral Daily  . pneumococcal 23 valent vaccine  0.5 mL Intramuscular Tomorrow-1000  . zinc sulfate  220 mg Oral Daily   Continuous Infusions: . cefTRIAXone (ROCEPHIN)  IV Stopped (07/15/20 2250)  . lactated ringers 75 mL/hr at 07/16/20 0218  . remdesivir 100 mg in NS 100 mL 100 mg (07/16/20 0921)   PRN Meds:.acetaminophen **OR** [DISCONTINUED]  acetaminophen, albuterol, bisacodyl, calcium carbonate (dosed in mg elemental calcium), camphor-menthol **AND** hydrOXYzine, chlorpheniramine-HYDROcodone, docusate sodium, feeding supplement (NEPRO CARB STEADY), guaiFENesin-dextromethorphan, [DISCONTINUED] ondansetron **OR** ondansetron (ZOFRAN) IV, oxyCODONE, polyethylene glycol, sorbitol, zolpidem  Antibiotics  :    Anti-infectives (From admission, onward)   Start     Dose/Rate Route Frequency Ordered Stop   07/15/20 1215  cefTRIAXone (ROCEPHIN) 1 g in sodium chloride 0.9 % 100 mL IVPB        1 g 200 mL/hr over 30 Minutes Intravenous Every 24 hours 07/15/20 1116 07/18/20 1214   07/15/20 1000  remdesivir 100 mg in sodium chloride 0.9 % 100 mL IVPB  Status:  Discontinued       "Followed by" Linked Group Details   100 mg 200 mL/hr over 30 Minutes Intravenous Daily 07/14/20 1449 07/14/20 1451   07/15/20 1000  remdesivir 100 mg in sodium chloride 0.9 % 100 mL IVPB       "Followed by" Linked Group Details   100 mg 200 mL/hr over 30 Minutes Intravenous Daily 07/14/20 1451 07/19/20 0959   07/14/20 1600  remdesivir 200 mg in sodium chloride  0.9% 250 mL IVPB       "Followed by" Linked Group Details   200 mg 580 mL/hr over 30 Minutes Intravenous Once 07/14/20 1451 07/14/20 1900   07/14/20 1500  remdesivir 200 mg in sodium chloride 0.9% 250 mL IVPB  Status:  Discontinued       "Followed by" Linked Group Details   200 mg 580 mL/hr over 30 Minutes Intravenous Once 07/14/20 1449 07/14/20 1451       Time Spent in minutes  30   Susa Raring M.D on 07/16/2020 at 10:15 AM  To page go to [email or web address]   [facility] -  Office  [phone]  See all Orders from today for further details    Objective:   Vitals:   07/15/20 1559 07/15/20 2005 07/16/20 0415 07/16/20 0723  BP: 128/86 (!) 131/98 134/87 (!) 133/92  Pulse: (!) 102 100 99 73  Resp: 15 16 15 18   Temp:  98.5 F (36.9 C) 98.2 F (36.8 C) 98.7 F (37.1 C)  TempSrc:  Oral Oral Axillary  SpO2: 96% 95% 95% 93%  Weight:      Height:        Wt Readings from Last 3 Encounters:  07/14/20 79.4 kg     Intake/Output Summary (Last 24 hours) at 07/16/2020 1015 Last data filed at 07/16/2020 0741 Gross per 24 hour  Intake 1942.12 ml  Output 1000 ml  Net 942.12 ml     Physical Exam  Awake Alert, No new F.N deficits, Chr L.sided weakness Leg >> arm .AT,PERRAL Supple Neck,No JVD, No cervical lymphadenopathy appriciated.  Symmetrical Chest wall movement, Good air movement bilaterally, CTAB RRR,No Gallops, Rubs or new Murmurs, No Parasternal Heave +ve B.Sounds, Abd Soft, No tenderness, No organomegaly appriciated, No rebound - guarding or rigidity. No Cyanosis, Clubbing or edema, No new Rash or bruise      Data Review:    CBC Recent Labs  Lab 07/14/20 0701 07/15/20 0034 07/16/20 0205  WBC 11.1* 10.1 19.8*  HGB 13.9 11.3* 10.5*  HCT 43.6 33.2* 32.1*  PLT 533* 295 438*  MCV 89.5 88.5 88.9  MCH 28.5 30.1 29.1  MCHC 31.9 34.0 32.7  RDW 13.0 13.1 13.0  LYMPHSABS  --  1.0 1.6  MONOABS  --  0.3 1.5*  EOSABS  --  0.0 0.0  BASOSABS  --  0.0 0.0  Recent Labs  Lab 07/14/20 0701 07/14/20 1119 07/15/20 0034 07/16/20 0205  NA 136  --  139 140  K 3.8  --  4.3 4.1  CL 94*  --  105 106  CO2 26  --  21* 23  GLUCOSE 118*  --  164* 113*  BUN 60*  --  49* 44*  CREATININE 4.80*  --  2.64* 1.55*  CALCIUM 9.9  --  8.4* 9.2  AST  --   --  36 32  ALT  --   --  27 26  ALKPHOS  --   --  37* 44  BILITOT  --   --  0.5 0.2*  ALBUMIN  --   --  2.9* 2.7*  MG  --   --  1.8 1.7  CRP  --  4.1* 3.3* 2.5*  DDIMER  --  8.92* 7.25* 3.96*  PROCALCITON 0.18  --   --   --   LATICACIDVEN  --  1.4  --   --   BNP  --   --  6.5 267.7*    ------------------------------------------------------------------------------------------------------------------ No results for input(s): CHOL, HDL, LDLCALC, TRIG, CHOLHDL, LDLDIRECT in the last 72 hours.  No results found for: HGBA1C ------------------------------------------------------------------------------------------------------------------ No results for input(s): TSH, T4TOTAL, T3FREE, THYROIDAB in the last 72 hours.  Invalid input(s): FREET3  Cardiac Enzymes No results for input(s): CKMB, TROPONINI, MYOGLOBIN in the last 168 hours.  Invalid input(s): CK ------------------------------------------------------------------------------------------------------------------    Component Value Date/Time   BNP 267.7 (H) 07/16/2020 0205    Micro Results Recent Results (from the past 240 hour(s))  Blood Culture (routine x 2)     Status: None (Preliminary result)   Collection Time: 07/14/20 11:19 AM   Specimen: BLOOD  Result Value Ref Range Status   Specimen Description BLOOD RIGHT ANTECUBITAL  Final   Special Requests   Final    BOTTLES DRAWN AEROBIC AND ANAEROBIC Blood Culture adequate volume   Culture   Final    NO GROWTH < 24 HOURS Performed at [facility], [address] [place], [place] [address]    Report Status PENDING  Incomplete  Blood Culture (routine x 2)     Status: None  (Preliminary result)   Collection Time: 07/14/20  4:43 PM   Specimen: BLOOD RIGHT HAND  Result Value Ref Range Status   Specimen Description BLOOD RIGHT HAND  Final   Special Requests   Final    BOTTLES DRAWN AEROBIC ONLY Blood Culture results may not be optimal due to an inadequate volume of blood received in culture bottles   Culture   Final    NO GROWTH < 24 HOURS Performed at [facility], [address] [place], [place] [address]    Report Status PENDING  Incomplete  MRSA PCR Screening     Status: None   Collection Time: 07/15/20  5:32 AM   Specimen: Nasal Mucosa; Nasopharyngeal  Result Value Ref Range Status   MRSA by PCR NEGATIVE NEGATIVE Final    Comment:        The GeneXpert MRSA Assay (FDA approved for NASAL specimens only), is one component of a comprehensive MRSA colonization surveillance program. It is not intended to diagnose MRSA infection nor to guide or monitor treatment for MRSA infections. Performed at [facility], [address] [place], [place] [address]     Radiology Reports DG Chest 2 View  Result Date: 07/05/2020 CLINICAL DATA:  Shortness of breath and fatigue  EXAM: CHEST - 2 VIEW COMPARISON:  None. FINDINGS: Indistinct densities at the lung bases on the frontal view. No edema, effusion, or pneumothorax. Normal heart size and mediastinal contours. IMPRESSION: Mild infiltrate or atelectasis at the lung bases, lateral view and low volumes favoring atelectasis. Electronically Signed   By: Marnee Spring M.D.   On: 07/05/2020 10:26   CT Head Wo Contrast  Result Date: 07/05/2020 CLINICAL DATA:  Worsening left-sided weakness (history of CVA) also with history of nontraumatic ICH. EXAM: CT HEAD WITHOUT CONTRAST TECHNIQUE: Contiguous axial images were obtained from the base of the skull through the vertex without intravenous contrast. COMPARISON:  No pertinent prior exams available for comparison. FINDINGS: Brain: Cerebral volume is normal for  age. There is a subcentimeter hypodensity within the right thalamocapsular junction with subtle peripheral curvilinear hyperdensity (for instance as seen on series 3, images 15-17). There are chronic lacunar infarcts within the left corona radiata and left pons. No demarcated cortical infarct. No extra-axial fluid collection. No evidence of intracranial mass. No midline shift. Vascular: No hyperdense vessel.  Atherosclerotic calcifications Skull: Normal. Negative for fracture or focal lesion. Sinuses/Orbits: Visualized orbits show no acute finding. No significant paranasal sinus disease at the imaged levels. These results were called by telephone at the time of interpretation on 07/05/2020 at 11:42 am to provider [facility] , who verbally acknowledged these results. IMPRESSION: Subcentimeter hypodensity within the right thalamocapsular junction with subtle peripheral curvilinear hyperdensity. This is favored to reflect subacute/chronic blood products at site of a subacute/chronic lacunar infarct. However, correlation with the patient's history and any available prior outside imaging is necessary. If this does not correlate with the patient's history and outside imaging, a contrast-enhanced brain MRI is recommended for further evaluation. Chronic lacunar infarcts within the left corona radiata and left basal ganglia. Electronically Signed   By: Jackey Loge DO   On: 07/05/2020 11:43   MR BRAIN WO CONTRAST  Result Date: 07/05/2020 CLINICAL DATA:  Neuro deficit, acute, stroke suspected History of thalamic ICH EXAM: MRI HEAD WITHOUT CONTRAST TECHNIQUE: Multiplanar, multiecho pulse sequences of the brain and surrounding structures were obtained without intravenous contrast. COMPARISON:  07/05/2020. FINDINGS: Brain: No focal restricted diffusion. Remote microhemorrhages involving the left thalamus, cerebellum and left basal ganglia. Sequela of remote insult involving the right thalamus with associated hemosiderin  deposition. No midline shift, ventriculomegaly or extra-axial fluid collection. No mass lesion. Chronic left corona radiata and pontine lacunar insults. Minimal chronic microvascular ischemic changes. Vascular: Normal flow voids. Skull and upper cervical spine: Normal marrow signal. Sinuses/Orbits: Normal orbits. Pneumatized paranasal sinuses. Bilateral mastoid free fluid. Other: None. IMPRESSION: Sequela of prior right thalamic hemorrhage. No acute intracranial hemorrhage or infarct. Remote insults involving the left corona radiata and pons. Sequela of hypertensive microangiopathy. Electronically Signed   By: Stana Bunting M.D.   On: 07/05/2020 16:14   NM Pulmonary Perfusion  Result Date: 07/15/2020 CLINICAL DATA:  Elevated D-dimer, high risk of pulmonary embolism due to recent COVID infection EXAM: NUCLEAR MEDICINE PERFUSION LUNG SCAN TECHNIQUE: Perfusion images were obtained in multiple projections after intravenous injection of radiopharmaceutical. Ventilation scans intentionally deferred if perfusion scan and chest x-ray adequate for interpretation during COVID 19 epidemic. RADIOPHARMACEUTICALS:  4.3 mCi Tc-10m MAA IV COMPARISON:  Chest radiograph 07/15/2020 FINDINGS: Normal perfusion lung scan. No perfusion defects identified. IMPRESSION: Normal perfusion lung scan. Electronically Signed   By: Ulyses Southward M.D.   On: 07/15/2020 15:54   US RENAL  Result Date: 07/14/2020 CLINICAL DATA:  Acute renal injury EXAM: RENAL / URINARY TRACT ULTRASOUND COMPLETE COMPARISON:  None. FINDINGS: Right Kidney: Renal measurements: 9.4 x 4.0 x 4.3 cm. = volume: 83 mL. Echogenicity within normal limits. No mass or hydronephrosis visualized. Left Kidney: Renal measurements: 10.8 x 5.2 x 4.2 cm. = volume: 124 mL. Echogenicity within normal limits. No mass or hydronephrosis visualized. Bladder: Appears normal for degree of bladder distention. Other: None. IMPRESSION: No acute abnormality noted. Electronically Signed   By:  Alcide Clever M.D.   On: 07/14/2020 21:15   DG Chest Port 1 View  Result Date: 07/15/2020 CLINICAL DATA:  Shortness of breath EXAM: PORTABLE CHEST 1 VIEW COMPARISON:  07/14/2020 FINDINGS: Numerous leads and wires project over the chest. Midline trachea. Normal heart size. Atherosclerosis in the transverse aorta. No pleural effusion or pneumothorax. Clear lungs. IMPRESSION: No acute cardiopulmonary disease. Aortic Atherosclerosis (ICD10-I70.0). Electronically Signed   By: Jeronimo Greaves M.D.   On: 07/15/2020 09:08   DG Chest Portable 1 View  Result Date: 07/14/2020 CLINICAL DATA:  Dyspnea, COVID pneumonia EXAM: PORTABLE CHEST 1 VIEW COMPARISON:  07/05/2020 FINDINGS: The heart size and mediastinal contours are within normal limits. Both lungs are clear. The visualized skeletal structures are unremarkable. IMPRESSION: No active disease. Electronically Signed   By: Helyn Numbers MD   On: 07/14/2020 10:43   VAS [name] LOWER EXTREMITY VENOUS (DVT)  Result Date: 07/15/2020  Lower Venous DVT Study Indications: Edema.  Comparison Study: no prior Performing Technologist: Blanch Media RVS  Examination Guidelines: A complete evaluation includes B-mode imaging, spectral Doppler, color Doppler, and power Doppler as needed of all accessible portions of each vessel. Bilateral testing is considered an integral part of a complete examination. Limited examinations for reoccurring indications may be performed as noted. The reflux portion of the exam is performed with the patient in reverse Trendelenburg.  +---------+---------------+---------+-----------+----------+--------------+ RIGHT    CompressibilityPhasicitySpontaneityPropertiesThrombus Aging +---------+---------------+---------+-----------+----------+--------------+ CFV      Full           Yes      Yes                                 +---------+---------------+---------+-----------+----------+--------------+ SFJ      Full                                                         +---------+---------------+---------+-----------+----------+--------------+ FV Prox  Full                                                        +---------+---------------+---------+-----------+----------+--------------+ FV Mid   Full                                                        +---------+---------------+---------+-----------+----------+--------------+ FV DistalFull                                                        +---------+---------------+---------+-----------+----------+--------------+  PFV      Full                                                        +---------+---------------+---------+-----------+----------+--------------+ POP      Full           Yes      Yes                                 +---------+---------------+---------+-----------+----------+--------------+ PTV      Full                                                        +---------+---------------+---------+-----------+----------+--------------+ PERO     Full                                                        +---------+---------------+---------+-----------+----------+--------------+   +---------+---------------+---------+-----------+----------+--------------+ LEFT     CompressibilityPhasicitySpontaneityPropertiesThrombus Aging +---------+---------------+---------+-----------+----------+--------------+ CFV      Full           Yes      Yes                                 +---------+---------------+---------+-----------+----------+--------------+ SFJ      Full                                                        +---------+---------------+---------+-----------+----------+--------------+ FV Prox  Full                                                        +---------+---------------+---------+-----------+----------+--------------+ FV Mid   Full                                                         +---------+---------------+---------+-----------+----------+--------------+ FV DistalFull                                                        +---------+---------------+---------+-----------+----------+--------------+ PFV      Full                                                        +---------+---------------+---------+-----------+----------+--------------+  POP      Full           Yes      Yes                                 +---------+---------------+---------+-----------+----------+--------------+ PTV      Full                                                        +---------+---------------+---------+-----------+----------+--------------+ PERO     Full                                                        +---------+---------------+---------+-----------+----------+--------------+     Summary: BILATERAL: - No evidence of deep vein thrombosis seen in the lower extremities, bilaterally. - No evidence of superficial venous thrombosis in the lower extremities, bilaterally. -No evidence of popliteal cyst, bilaterally.   *See table(s) above for measurements and observations. Electronically signed by Heath Lark on 07/15/2020 at 3:49:54 PM.    Final

## 2020-07-17 DIAGNOSIS — N179 Acute kidney failure, unspecified: Secondary | ICD-10-CM | POA: Diagnosis not present

## 2020-07-17 DIAGNOSIS — I10 Essential (primary) hypertension: Secondary | ICD-10-CM | POA: Diagnosis not present

## 2020-07-17 DIAGNOSIS — U071 COVID-19: Secondary | ICD-10-CM | POA: Diagnosis not present

## 2020-07-17 DIAGNOSIS — R0602 Shortness of breath: Secondary | ICD-10-CM | POA: Diagnosis not present

## 2020-07-17 LAB — CBC WITH DIFFERENTIAL/PLATELET
Abs Immature Granulocytes: 0.44 10*3/uL — ABNORMAL HIGH (ref 0.00–0.07)
Basophils Absolute: 0 10*3/uL (ref 0.0–0.1)
Basophils Relative: 0 %
Eosinophils Absolute: 0 10*3/uL (ref 0.0–0.5)
Eosinophils Relative: 0 %
HCT: 31.9 % — ABNORMAL LOW (ref 36.0–46.0)
Hemoglobin: 10.2 g/dL — ABNORMAL LOW (ref 12.0–15.0)
Immature Granulocytes: 2 %
Lymphocytes Relative: 13 %
Lymphs Abs: 2.4 10*3/uL (ref 0.7–4.0)
MCH: 28.4 pg (ref 26.0–34.0)
MCHC: 32 g/dL (ref 30.0–36.0)
MCV: 88.9 fL (ref 80.0–100.0)
Monocytes Absolute: 1.1 10*3/uL — ABNORMAL HIGH (ref 0.1–1.0)
Monocytes Relative: 6 %
Neutro Abs: 14.2 10*3/uL — ABNORMAL HIGH (ref 1.7–7.7)
Neutrophils Relative %: 79 %
Platelets: 426 10*3/uL — ABNORMAL HIGH (ref 150–400)
RBC: 3.59 MIL/uL — ABNORMAL LOW (ref 3.87–5.11)
RDW: 13.1 % (ref 11.5–15.5)
WBC: 18.1 10*3/uL — ABNORMAL HIGH (ref 4.0–10.5)
nRBC: 0 % (ref 0.0–0.2)

## 2020-07-17 LAB — COMPREHENSIVE METABOLIC PANEL
ALT: 28 U/L (ref 0–44)
AST: 28 U/L (ref 15–41)
Albumin: 2.4 g/dL — ABNORMAL LOW (ref 3.5–5.0)
Alkaline Phosphatase: 42 U/L (ref 38–126)
Anion gap: 11 (ref 5–15)
BUN: 41 mg/dL — ABNORMAL HIGH (ref 6–20)
CO2: 23 mmol/L (ref 22–32)
Calcium: 8.8 mg/dL — ABNORMAL LOW (ref 8.9–10.3)
Chloride: 106 mmol/L (ref 98–111)
Creatinine, Ser: 1.47 mg/dL — ABNORMAL HIGH (ref 0.44–1.00)
GFR, Estimated: 42 mL/min — ABNORMAL LOW (ref >60)
Glucose, Bld: 133 mg/dL — ABNORMAL HIGH (ref 70–99)
Potassium: 3.8 mmol/L (ref 3.5–5.1)
Sodium: 140 mmol/L (ref 135–145)
Total Bilirubin: 0.7 mg/dL (ref 0.3–1.2)
Total Protein: 5.5 g/dL — ABNORMAL LOW (ref 6.5–8.1)

## 2020-07-17 LAB — BRAIN NATRIURETIC PEPTIDE: B Natriuretic Peptide: 247.5 pg/mL — ABNORMAL HIGH (ref 0.0–100.0)

## 2020-07-17 LAB — C-REACTIVE PROTEIN: CRP: 1.2 mg/dL — ABNORMAL HIGH (ref <1.0)

## 2020-07-17 LAB — D-DIMER, QUANTITATIVE: D-Dimer, Quant: 5.5 ug/mL-FEU — ABNORMAL HIGH (ref 0.00–0.50)

## 2020-07-17 LAB — MAGNESIUM: Magnesium: 1.6 mg/dL — ABNORMAL LOW (ref 1.7–2.4)

## 2020-07-17 MED ORDER — AMLODIPINE BESYLATE 10 MG PO TABS
10.0000 mg | ORAL_TABLET | Freq: Every day | ORAL | Status: DC
  Administered 2020-07-17 – 2020-07-28 (×12): 10 mg via ORAL
  Filled 2020-07-17 (×12): qty 1

## 2020-07-17 MED ORDER — METHYLPREDNISOLONE SODIUM SUCC 40 MG IJ SOLR: 20.0000 mg | Freq: Every day | INTRAMUSCULAR | Status: DC

## 2020-07-17 MED ORDER — HYDRALAZINE HCL 20 MG/ML IJ SOLN: 10.0000 mg | Freq: Four times a day (QID) | INTRAMUSCULAR | Status: DC | PRN

## 2020-07-17 MED ORDER — MAGNESIUM SULFATE 2 GM/50ML IV SOLN
2.0000 g | Freq: Once | INTRAVENOUS | Status: AC
  Administered 2020-07-17: 2 g via INTRAVENOUS
  Filled 2020-07-17: qty 50

## 2020-07-17 NOTE — Progress Notes (Signed)
PROGRESS NOTE                                                                                                                                                                                                             Patient Demographics:    Shirley Hansen, is a 55 y.o. female, DOB - [birth year], ZOX:[id number]  Outpatient Primary MD for the patient is Pcp, No    LOS - 3  Admit date - 07/14/2020    Chief Complaint  Patient presents with  . Back Pain  . Weakness    Covid        Brief Narrative (HPI from H&P)  - Shirley RothmanDetra Clelia CroftShaw is a 55 y.o. female with medical history significant of CVA with left-sided deficits; HTN; and stage 3a CKD presenting with worsening COVID symptoms, positive on 1/24.  She was seen in the ER on 1/24 for progressive and generalized weakness, she had no shortness of breath her main symptom was generalized weakness, in the ER her work-up was consistent with severe dehydration she was found to have extremely high D-dimer, AKI, hypotension and she was admitted   Subjective:   Patient in bed, appears comfortable, denies any headache, no fever, no chest pain or pressure, no shortness of breath , no abdominal pain. No focal weakness.   Assessment  & Plan :   1. Acute COVID-19 infection - she has received 1 shot of Pfizer vaccine recently, she has minimal pulmonary involvement and symptoms are more generalized with severe dehydration and AKI.  CRP is borderline but her D-dimer is extremely elevated.  She will be treated with IV fluids, low-dose steroids and Remdesivir.  With rapid tapering of steroids.  Encouraged the patient to sit up in chair in the daytime use I-S and flutter valve for pulmonary toiletry and then prone in bed when at night.  Will advance activity and titrate down oxygen as possible.   SpO2: 94 %  Recent Labs  Lab 07/14/20 0701 07/14/20 1119 07/15/20 0034 07/16/20 0205 07/17/20 0453  WBC  11.1*  --  10.1 19.8* 18.1*  HGB 13.9  --  11.3* 10.5* 10.2*  HCT 43.6  --  33.2* 32.1* 31.9*  PLT 533*  --  295 438* 426*  CRP  --  4.1* 3.3* 2.5* 1.2*  BNP  --   --  6.5  267.7* 247.5*  DDIMER  --  8.92* 7.25* 3.96* 5.50*  PROCALCITON 0.18  --   --   --   --   AST  --   --  36 32 28  ALT  --   --  27 26 28   ALKPHOS  --   --  37* 44 42  BILITOT  --   --  0.5 0.2* 0.7  ALBUMIN  --   --  2.9* 2.7* 2.4*  LATICACIDVEN  --  1.4  --   --   --     2.  Dehydration with AKI.  Hydrate with IV fluids avoid nephrotoxins.  She has underlying CKD 3A with baseline creatinine around 1.5.  3.  History of CVA with left-sided hemiparesis.  Supportive care.  Continue aspirin statin for secondary prevention, PT OT as needed.  4.  High D-dimer with severe dehydration.  High risk for developing a blood clot, leg ultrasound & VQ negative for now continue on moderate dose Lovenox.  5.  Obesity with BMI 32.  Follow with PCP for weight loss.  6.  Possible UTI.  3 days of Rocephin, follow cultures.  7. Hypomagnesemia. Replaced on 07/17/2020.      Condition -   Guarded  Family Communication  : Niece Ernestine Mcmurray [phone] on 07/15/2020 - wrong number  Code Status :  Full  Consults  :  PCCM  Procedures  :    Leg [name]  -ve  VQ Scan  -ve  PUD Prophylaxis : PPI  Disposition Plan  :    Status is: Inpatient  Remains inpatient appropriate because:IV treatments appropriate due to intensity of illness or inability to take PO   Dispo: The patient is from: Home              Anticipated d/c is to: Home              Anticipated d/c date is: > 3 days              Patient currently is not medically stable to d/c.   Difficult to place patient No   DVT Prophylaxis  :  Hep gtt >> Lovenox  Lab Results  Component Value Date   PLT 426 (H) 07/17/2020    Diet :  Diet Order            Diet regular Room service appropriate? Yes; Fluid consistency: Thin  Diet effective now                  Inpatient  Medications  Scheduled Meds: . vitamin C  500 mg Oral Daily  . aspirin EC  81 mg Oral Daily  . atorvastatin  40 mg Oral QHS  . brimonidine  1 drop Both Eyes BID  . diazepam  5 mg Intravenous Once  . dorzolamide-timolol  1 drop Both Eyes BID  . enoxaparin (LOVENOX) injection  40 mg Subcutaneous Q12H  . feeding supplement  237 mL Oral BID BM  . gabapentin  300 mg Oral BID  . hydrocortisone cream   Topical BID  . latanoprost  1 drop Both Eyes QHS  . loratadine  10 mg Oral Daily  . methylPREDNISolone (SOLU-MEDROL) injection  40 mg Intravenous Daily  . multivitamin with minerals  1 tablet Oral Daily  . pantoprazole  40 mg Oral Daily  . pneumococcal 23 valent vaccine  0.5 mL Intramuscular Tomorrow-1000  . zinc sulfate  220 mg Oral Daily   Continuous Infusions: . cefTRIAXone (  ROCEPHIN)  IV 1 g (07/16/20 1144)  . remdesivir 100 mg in NS 100 mL 100 mg (07/17/20 1036)   PRN Meds:.acetaminophen **OR** [DISCONTINUED] acetaminophen, albuterol, bisacodyl, calcium carbonate (dosed in mg elemental calcium), camphor-menthol **AND** hydrOXYzine, chlorpheniramine-HYDROcodone, docusate sodium, feeding supplement (NEPRO CARB STEADY), guaiFENesin-dextromethorphan, [DISCONTINUED] ondansetron **OR** ondansetron (ZOFRAN) IV, oxyCODONE, polyethylene glycol, sorbitol, zolpidem  Antibiotics  :    Anti-infectives (From admission, onward)   Start     Dose/Rate Route Frequency Ordered Stop   07/15/20 1215  cefTRIAXone (ROCEPHIN) 1 g in sodium chloride 0.9 % 100 mL IVPB        1 g 200 mL/hr over 30 Minutes Intravenous Every 24 hours 07/15/20 1116 07/18/20 1214   07/15/20 1000  remdesivir 100 mg in sodium chloride 0.9 % 100 mL IVPB  Status:  Discontinued       "Followed by" Linked Group Details   100 mg 200 mL/hr over 30 Minutes Intravenous Daily 07/14/20 1449 07/14/20 1451   07/15/20 1000  remdesivir 100 mg in sodium chloride 0.9 % 100 mL IVPB       "Followed by" Linked Group Details   100 mg 200 mL/hr over  30 Minutes Intravenous Daily 07/14/20 1451 07/19/20 0959   07/14/20 1600  remdesivir 200 mg in sodium chloride 0.9% 250 mL IVPB       "Followed by" Linked Group Details   200 mg 580 mL/hr over 30 Minutes Intravenous Once 07/14/20 1451 07/14/20 1900   07/14/20 1500  remdesivir 200 mg in sodium chloride 0.9% 250 mL IVPB  Status:  Discontinued       "Followed by" Linked Group Details   200 mg 580 mL/hr over 30 Minutes Intravenous Once 07/14/20 1449 07/14/20 1451       Time Spent in minutes  30   Susa Raring M.D on 07/17/2020 at 12:09 PM  To page go to [email or web address]   [facility] -  Office  [phone]  See all Orders from today for further details    Objective:   Vitals:   07/16/20 2020 07/16/20 2331 07/17/20 0400 07/17/20 0806  BP: (!) 161/99 (!) 150/106 (!) 143/93 137/90  Pulse: 100 100 92 92  Resp: 17 20 17 16   Temp: 98.7 F (37.1 C) 98.4 F (36.9 C) 98.4 F (36.9 C) 98.6 F (37 C)  TempSrc: Oral Oral Oral Oral  SpO2: 96% 92% 96% 94%  Weight:      Height:        Wt Readings from Last 3 Encounters:  07/14/20 79.4 kg     Intake/Output Summary (Last 24 hours) at 07/17/2020 1209 Last data filed at 07/17/2020 1036 Gross per 24 hour  Intake 150 ml  Output 1150 ml  Net -1000 ml     Physical Exam  Awake Alert, No new F.N deficits, Chr L.sided weakness Leg >> arm [place].AT,PERRAL Supple Neck,No JVD, No cervical lymphadenopathy appriciated.  Symmetrical Chest wall movement, Good air movement bilaterally, CTAB RRR,No Gallops, Rubs or new Murmurs, No Parasternal Heave +ve B.Sounds, Abd Soft, No tenderness, No organomegaly appriciated, No rebound - guarding or rigidity. No Cyanosis, Clubbing or edema, No new Rash or bruise    Data Review:    CBC Recent Labs  Lab 07/14/20 0701 07/15/20 0034 07/16/20 0205 07/17/20 0453  WBC 11.1* 10.1 19.8* 18.1*  HGB 13.9 11.3* 10.5* 10.2*  HCT 43.6 33.2* 32.1* 31.9*  PLT 533* 295 438* 426*  MCV 89.5 88.5 88.9 88.9   MCH 28.5 30.1 29.1 28.4  MCHC  31.9 34.0 32.7 32.0  RDW 13.0 13.1 13.0 13.1  LYMPHSABS  --  1.0 1.6 2.4  MONOABS  --  0.3 1.5* 1.1*  EOSABS  --  0.0 0.0 0.0  BASOSABS  --  0.0 0.0 0.0    Recent Labs  Lab 07/14/20 0701 07/14/20 1119 07/15/20 0034 07/16/20 0205 07/17/20 0453  NA 136  --  139 140 140  K 3.8  --  4.3 4.1 3.8  CL 94*  --  105 106 106  CO2 26  --  21* 23 23  GLUCOSE 118*  --  164* 113* 133*  BUN 60*  --  49* 44* 41*  CREATININE 4.80*  --  2.64* 1.55* 1.47*  CALCIUM 9.9  --  8.4* 9.2 8.8*  AST  --   --  36 32 28  ALT  --   --  27 26 28   ALKPHOS  --   --  37* 44 42  BILITOT  --   --  0.5 0.2* 0.7  ALBUMIN  --   --  2.9* 2.7* 2.4*  MG  --   --  1.8 1.7 1.6*  CRP  --  4.1* 3.3* 2.5* 1.2*  DDIMER  --  8.92* 7.25* 3.96* 5.50*  PROCALCITON 0.18  --   --   --   --   LATICACIDVEN  --  1.4  --   --   --   BNP  --   --  6.5 267.7* 247.5*    ------------------------------------------------------------------------------------------------------------------ No results for input(s): CHOL, HDL, LDLCALC, TRIG, CHOLHDL, LDLDIRECT in the last 72 hours.  No results found for: HGBA1C ------------------------------------------------------------------------------------------------------------------ No results for input(s): TSH, T4TOTAL, T3FREE, THYROIDAB in the last 72 hours.  Invalid input(s): FREET3  Cardiac Enzymes No results for input(s): CKMB, TROPONINI, MYOGLOBIN in the last 168 hours.  Invalid input(s): CK ------------------------------------------------------------------------------------------------------------------    Component Value Date/Time   BNP 247.5 (H) 07/17/2020 0453    Micro Results Recent Results (from the past 240 hour(s))  Blood Culture (routine x 2)     Status: None (Preliminary result)   Collection Time: 07/14/20 11:19 AM   Specimen: BLOOD  Result Value Ref Range Status   Specimen Description BLOOD RIGHT ANTECUBITAL  Final   Special Requests    Final    BOTTLES DRAWN AEROBIC AND ANAEROBIC Blood Culture adequate volume   Culture   Final    NO GROWTH 3 DAYS Performed at [facility], [address] [place], [place]    Report Status PENDING  Incomplete  Blood Culture (routine x 2)     Status: None (Preliminary result)   Collection Time: 07/14/20  4:43 PM   Specimen: BLOOD RIGHT HAND  Result Value Ref Range Status   Specimen Description BLOOD RIGHT HAND  Final   Special Requests   Final    BOTTLES DRAWN AEROBIC ONLY Blood Culture results may not be optimal due to an inadequate volume of blood received in culture bottles   Culture   Final    NO GROWTH 3 DAYS Performed at [facility], [address] [place], [place]    Report Status PENDING  Incomplete  Culture, Urine     Status: None   Collection Time: 07/14/20  5:52 PM   Specimen: Urine, Random  Result Value Ref Range Status   Specimen Description URINE, RANDOM  Final   Special Requests NONE  Final   Culture   Final    NO  GROWTH Performed at [facility], [address], [place], [place] [address]    Report Status 07/16/2020 FINAL  Final  MRSA PCR Screening     Status: None   Collection Time: 07/15/20  5:32 AM   Specimen: Nasal Mucosa; Nasopharyngeal  Result Value Ref Range Status   MRSA by PCR NEGATIVE NEGATIVE Final    Comment:        The GeneXpert MRSA Assay (FDA approved for NASAL specimens only), is one component of a comprehensive MRSA colonization surveillance program. It is not intended to diagnose MRSA infection nor to guide or monitor treatment for MRSA infections. Performed at [facility], [address], [place], [place] [address]     Radiology Reports DG Chest 2 View  Result Date: 07/05/2020 CLINICAL DATA:  Shortness of breath and fatigue EXAM: CHEST - 2 VIEW COMPARISON:  None. FINDINGS: Indistinct densities at the lung bases on the frontal view. No edema, effusion, or pneumothorax. Normal heart  size and mediastinal contours. IMPRESSION: Mild infiltrate or atelectasis at the lung bases, lateral view and low volumes favoring atelectasis. Electronically Signed   By: Marnee Spring M.D.   On: 07/05/2020 10:26   CT Head Wo Contrast  Result Date: 07/05/2020 CLINICAL DATA:  Worsening left-sided weakness (history of CVA) also with history of nontraumatic ICH. EXAM: CT HEAD WITHOUT CONTRAST TECHNIQUE: Contiguous axial images were obtained from the base of the skull through the vertex without intravenous contrast. COMPARISON:  No pertinent prior exams available for comparison. FINDINGS: Brain: Cerebral volume is normal for age. There is a subcentimeter hypodensity within the right thalamocapsular junction with subtle peripheral curvilinear hyperdensity (for instance as seen on series 3, images 15-17). There are chronic lacunar infarcts within the left corona radiata and left pons. No demarcated cortical infarct. No extra-axial fluid collection. No evidence of intracranial mass. No midline shift. Vascular: No hyperdense vessel.  Atherosclerotic calcifications Skull: Normal. Negative for fracture or focal lesion. Sinuses/Orbits: Visualized orbits show no acute finding. No significant paranasal sinus disease at the imaged levels. These results were called by telephone at the time of interpretation on 07/05/2020 at 11:42 am to provider [facility] , who verbally acknowledged these results. IMPRESSION: Subcentimeter hypodensity within the right thalamocapsular junction with subtle peripheral curvilinear hyperdensity. This is favored to reflect subacute/chronic blood products at site of a subacute/chronic lacunar infarct. However, correlation with the patient's history and any available prior outside imaging is necessary. If this does not correlate with the patient's history and outside imaging, a contrast-enhanced brain MRI is recommended for further evaluation. Chronic lacunar infarcts within the left corona  radiata and left basal ganglia. Electronically Signed   By: Jackey Loge DO   On: 07/05/2020 11:43   MR BRAIN WO CONTRAST  Result Date: 07/05/2020 CLINICAL DATA:  Neuro deficit, acute, stroke suspected History of thalamic ICH EXAM: MRI HEAD WITHOUT CONTRAST TECHNIQUE: Multiplanar, multiecho pulse sequences of the brain and surrounding structures were obtained without intravenous contrast. COMPARISON:  07/05/2020. FINDINGS: Brain: No focal restricted diffusion. Remote microhemorrhages involving the left thalamus, cerebellum and left basal ganglia. Sequela of remote insult involving the right thalamus with associated hemosiderin deposition. No midline shift, ventriculomegaly or extra-axial fluid collection. No mass lesion. Chronic left corona radiata and pontine lacunar insults. Minimal chronic microvascular ischemic changes. Vascular: Normal flow voids. Skull and upper cervical spine: Normal marrow signal. Sinuses/Orbits: Normal orbits. Pneumatized paranasal sinuses. Bilateral mastoid free fluid. Other: None. IMPRESSION: Sequela of prior right thalamic hemorrhage.  No acute intracranial hemorrhage or infarct. Remote insults involving the left corona radiata and pons. Sequela of hypertensive microangiopathy. Electronically Signed   By: Stana Bunting M.D.   On: 07/05/2020 16:14   NM Pulmonary Perfusion  Result Date: 07/15/2020 CLINICAL DATA:  Elevated D-dimer, high risk of pulmonary embolism due to recent COVID infection EXAM: NUCLEAR MEDICINE PERFUSION LUNG SCAN TECHNIQUE: Perfusion images were obtained in multiple projections after intravenous injection of radiopharmaceutical. Ventilation scans intentionally deferred if perfusion scan and chest x-ray adequate for interpretation during COVID 19 epidemic. RADIOPHARMACEUTICALS:  4.3 mCi Tc-19m MAA IV COMPARISON:  Chest radiograph 07/15/2020 FINDINGS: Normal perfusion lung scan. No perfusion defects identified. IMPRESSION: Normal perfusion lung scan.  Electronically Signed   By: Ulyses Southward M.D.   On: 07/15/2020 15:54   US RENAL  Result Date: 07/14/2020 CLINICAL DATA:  Acute renal injury EXAM: RENAL / URINARY TRACT ULTRASOUND COMPLETE COMPARISON:  None. FINDINGS: Right Kidney: Renal measurements: 9.4 x 4.0 x 4.3 cm. = volume: 83 mL. Echogenicity within normal limits. No mass or hydronephrosis visualized. Left Kidney: Renal measurements: 10.8 x 5.2 x 4.2 cm. = volume: 124 mL. Echogenicity within normal limits. No mass or hydronephrosis visualized. Bladder: Appears normal for degree of bladder distention. Other: None. IMPRESSION: No acute abnormality noted. Electronically Signed   By: Alcide Clever M.D.   On: 07/14/2020 21:15   DG Chest Port 1 View  Result Date: 07/15/2020 CLINICAL DATA:  Shortness of breath EXAM: PORTABLE CHEST 1 VIEW COMPARISON:  07/14/2020 FINDINGS: Numerous leads and wires project over the chest. Midline trachea. Normal heart size. Atherosclerosis in the transverse aorta. No pleural effusion or pneumothorax. Clear lungs. IMPRESSION: No acute cardiopulmonary disease. Aortic Atherosclerosis (ICD10-I70.0). Electronically Signed   By: Jeronimo Greaves M.D.   On: 07/15/2020 09:08   DG Chest Portable 1 View  Result Date: 07/14/2020 CLINICAL DATA:  Dyspnea, COVID pneumonia EXAM: PORTABLE CHEST 1 VIEW COMPARISON:  07/05/2020 FINDINGS: The heart size and mediastinal contours are within normal limits. Both lungs are clear. The visualized skeletal structures are unremarkable. IMPRESSION: No active disease. Electronically Signed   By: Helyn Numbers MD   On: 07/14/2020 10:43   VAS [name] LOWER EXTREMITY VENOUS (DVT)  Result Date: 07/15/2020  Lower Venous DVT Study Indications: Edema.  Comparison Study: no prior Performing Technologist: Blanch Media RVS  Examination Guidelines: A complete evaluation includes B-mode imaging, spectral Doppler, color Doppler, and power Doppler as needed of all accessible portions of each vessel. Bilateral testing is  considered an integral part of a complete examination. Limited examinations for reoccurring indications may be performed as noted. The reflux portion of the exam is performed with the patient in reverse Trendelenburg.  +---------+---------------+---------+-----------+----------+--------------+ RIGHT    CompressibilityPhasicitySpontaneityPropertiesThrombus Aging +---------+---------------+---------+-----------+----------+--------------+ CFV      Full           Yes      Yes                                 +---------+---------------+---------+-----------+----------+--------------+ SFJ      Full                                                        +---------+---------------+---------+-----------+----------+--------------+ FV Prox  Full                                                        +---------+---------------+---------+-----------+----------+--------------+  FV Mid   Full                                                        +---------+---------------+---------+-----------+----------+--------------+ FV DistalFull                                                        +---------+---------------+---------+-----------+----------+--------------+ PFV      Full                                                        +---------+---------------+---------+-----------+----------+--------------+ POP      Full           Yes      Yes                                 +---------+---------------+---------+-----------+----------+--------------+ PTV      Full                                                        +---------+---------------+---------+-----------+----------+--------------+ PERO     Full                                                        +---------+---------------+---------+-----------+----------+--------------+   +---------+---------------+---------+-----------+----------+--------------+ LEFT      CompressibilityPhasicitySpontaneityPropertiesThrombus Aging +---------+---------------+---------+-----------+----------+--------------+ CFV      Full           Yes      Yes                                 +---------+---------------+---------+-----------+----------+--------------+ SFJ      Full                                                        +---------+---------------+---------+-----------+----------+--------------+ FV Prox  Full                                                        +---------+---------------+---------+-----------+----------+--------------+ FV Mid   Full                                                        +---------+---------------+---------+-----------+----------+--------------+  FV DistalFull                                                        +---------+---------------+---------+-----------+----------+--------------+ PFV      Full                                                        +---------+---------------+---------+-----------+----------+--------------+ POP      Full           Yes      Yes                                 +---------+---------------+---------+-----------+----------+--------------+ PTV      Full                                                        +---------+---------------+---------+-----------+----------+--------------+ PERO     Full                                                        +---------+---------------+---------+-----------+----------+--------------+     Summary: BILATERAL: - No evidence of deep vein thrombosis seen in the lower extremities, bilaterally. - No evidence of superficial venous thrombosis in the lower extremities, bilaterally. -No evidence of popliteal cyst, bilaterally.   *See table(s) above for measurements and observations. Electronically signed by Heath Lark on 07/15/2020 at 3:49:54 PM.    Final

## 2020-07-17 NOTE — Plan of Care (Signed)
  Problem: Education: Goal: Knowledge of risk factors and measures for prevention of condition will improve Outcome: Progressing   Problem: Coping: Goal: Psychosocial and spiritual needs will be supported Outcome: Progressing   Problem: Education: Goal: Knowledge of General Education information will improve Description: Including pain rating scale, medication(s)/side effects and non-pharmacologic comfort measures Outcome: Progressing   Problem: Health Behavior/Discharge Planning: Goal: Ability to manage health-related needs will improve Outcome: Progressing   Problem: Clinical Measurements: Goal: Ability to maintain clinical measurements within normal limits will improve Outcome: Progressing Goal: Will remain free from infection Outcome: Progressing Goal: Diagnostic test results will improve Outcome: Progressing   Problem: Elimination: Goal: Will not experience complications related to urinary retention Outcome: Progressing   Problem: Skin Integrity: Goal: Risk for impaired skin integrity will decrease Outcome: Progressing   Problem: Activity: Goal: Risk for activity intolerance will decrease Outcome: Not Progressing   Problem: Elimination: Goal: Will not experience complications related to bowel motility Outcome: Not Progressing   Problem: Pain Managment: Goal: General experience of comfort will improve Outcome: Not Progressing

## 2020-07-18 ENCOUNTER — Inpatient Hospital Stay (HOSPITAL_COMMUNITY): Payer: Medicaid Other

## 2020-07-18 DIAGNOSIS — I10 Essential (primary) hypertension: Secondary | ICD-10-CM | POA: Diagnosis not present

## 2020-07-18 DIAGNOSIS — U071 COVID-19: Secondary | ICD-10-CM | POA: Diagnosis not present

## 2020-07-18 DIAGNOSIS — N179 Acute kidney failure, unspecified: Secondary | ICD-10-CM | POA: Diagnosis not present

## 2020-07-18 DIAGNOSIS — R0602 Shortness of breath: Secondary | ICD-10-CM | POA: Diagnosis not present

## 2020-07-18 LAB — D-DIMER, QUANTITATIVE: D-Dimer, Quant: 3.65 ug/mL-FEU — ABNORMAL HIGH (ref 0.00–0.50)

## 2020-07-18 LAB — C-REACTIVE PROTEIN: CRP: 1.1 mg/dL — ABNORMAL HIGH (ref <1.0)

## 2020-07-18 LAB — URIC ACID: Uric Acid, Serum: 7.2 mg/dL — ABNORMAL HIGH (ref 2.5–7.1)

## 2020-07-18 LAB — MAGNESIUM: Magnesium: 1.9 mg/dL (ref 1.7–2.4)

## 2020-07-18 MED ORDER — COLCHICINE 0.6 MG PO TABS
0.6000 mg | ORAL_TABLET | Freq: Every day | ORAL | Status: DC
  Administered 2020-07-19 – 2020-07-28 (×10): 0.6 mg via ORAL
  Filled 2020-07-18 (×10): qty 1

## 2020-07-18 MED ORDER — METHYLPREDNISOLONE SODIUM SUCC 40 MG IJ SOLR
40.0000 mg | Freq: Every day | INTRAMUSCULAR | Status: AC
  Administered 2020-07-18: 40 mg via INTRAVENOUS
  Filled 2020-07-18: qty 1

## 2020-07-18 MED ORDER — COLCHICINE 0.6 MG PO TABS
0.6000 mg | ORAL_TABLET | Freq: Two times a day (BID) | ORAL | Status: AC
  Administered 2020-07-18 (×2): 0.6 mg via ORAL
  Filled 2020-07-18 (×2): qty 1

## 2020-07-18 NOTE — Progress Notes (Signed)
PROGRESS NOTE                                                                                                                                                                                                             Patient Demographics:    Shirley Hansen, is a 55 y.o. female, DOB - May 03, 1966, PQZ:[id number]  Outpatient Primary MD for the patient is Pcp, No    LOS - 4  Admit date - 07/14/2020    Chief Complaint  Patient presents with  . Back Pain  . Weakness    Covid        Brief Narrative (HPI from H&P)  - Shirley Hansen is a 55 y.o. female with medical history significant of CVA with left-sided deficits; HTN; and stage 3a CKD presenting with worsening COVID symptoms, positive on 1/24.  She was seen in the ER on 1/24 for progressive and generalized weakness, she had no shortness of breath her main symptom was generalized weakness, in the ER her work-up was consistent with severe dehydration she was found to have extremely high D-dimer, AKI, hypotension and she was admitted   Subjective:   Patient in bed, appears comfortable, denies any headache, no fever, no chest pain or pressure, no shortness of breath , no abdominal pain.  New weakness chronic left-sided weakness but today has bilateral foot discomfort and pain which is slightly diffuse and in both feet, left worse than right   Assessment  & Plan :   1. Acute COVID-19 infection - she has received 1 shot of Pfizer vaccine recently, she has minimal pulmonary involvement and symptoms are more generalized with severe dehydration and AKI.  CRP is borderline but her D-dimer is extremely elevated.  She will be treated with IV fluids, low-dose steroids and Remdesivir.  With rapid tapering of steroids.  Encouraged the patient to sit up in chair in the daytime use I-S and flutter valve for pulmonary toiletry and then prone in bed when at night.  Will advance activity and titrate down  oxygen as possible.   SpO2: 95 %  Recent Labs  Lab 07/14/20 0701 07/14/20 1119 07/15/20 0034 07/16/20 0205 07/17/20 0453 07/18/20 0335  WBC 11.1*  --  10.1 19.8* 18.1*  --   HGB 13.9  --  11.3* 10.5* 10.2*  --   HCT 43.6  --  33.2* 32.1* 31.9*  --   PLT 533*  --  295 438* 426*  --   CRP  --  4.1* 3.3* 2.5* 1.2* 1.1*  BNP  --   --  6.5 267.7* 247.5*  --   DDIMER  --  8.92* 7.25* 3.96* 5.50* 3.65*  PROCALCITON 0.18  --   --   --   --   --   AST  --   --  36 32 28  --   ALT  --   --  27 26 28   --   ALKPHOS  --   --  37* 44 42  --   BILITOT  --   --  0.5 0.2* 0.7  --   ALBUMIN  --   --  2.9* 2.7* 2.4*  --   LATICACIDVEN  --  1.4  --   --   --   --     2.  Dehydration with AKI.  Hydrate with IV fluids avoid nephrotoxins.  She has underlying CKD 3A with baseline creatinine around 1.5.  3.  History of CVA with left-sided hemiparesis.  Supportive care.  Continue aspirin statin for secondary prevention, PT OT as needed.  4.  High D-dimer with severe dehydration.  High risk for developing a blood clot, leg ultrasound & VQ negative for now continue on moderate dose Lovenox.  5.  Obesity with BMI 32.  Follow with PCP for weight loss.  6.  Possible UTI.  3 days of Rocephin, follow cultures.  7. Hypomagnesemia. Replaced on 07/17/2020.  8.  Bilateral foot discomfort left more than right, diffusely tender, no effusion redness or warmth, question if she is developing early gout, check uric acid, left foot x-ray, colchicine and Solu-Medrol.  Monitor.      Condition -   Guarded  Family Communication  : Niece Ernestine Mcmurray [phone] on 07/15/2020 - wrong number  Code Status :  Full  Consults  :  PCCM  Procedures  :    Leg [name]  -ve  VQ Scan  -ve  PUD Prophylaxis : PPI  Disposition Plan  :    Status is: Inpatient  Remains inpatient appropriate because:IV treatments appropriate due to intensity of illness or inability to take PO   Dispo: The patient is from: Home               Anticipated d/c is to: Home              Anticipated d/c date is: > 3 days              Patient currently is not medically stable to d/c.   Difficult to place patient No   DVT Prophylaxis  :  Hep gtt >> Lovenox  Lab Results  Component Value Date   PLT 426 (H) 07/17/2020    Diet :  Diet Order            Diet regular Room service appropriate? Yes; Fluid consistency: Thin  Diet effective now                  Inpatient Medications  Scheduled Meds: . amLODipine  10 mg Oral Daily  . vitamin C  500 mg Oral Daily  . aspirin EC  81 mg Oral Daily  . atorvastatin  40 mg Oral QHS  . brimonidine  1 drop Both Eyes BID  . colchicine  0.6 mg Oral BID  . [START ON 07/19/2020] colchicine  0.6 mg Oral Daily  .  dorzolamide-timolol  1 drop Both Eyes BID  . enoxaparin (LOVENOX) injection  40 mg Subcutaneous Q12H  . feeding supplement  237 mL Oral BID BM  . gabapentin  300 mg Oral BID  . hydrocortisone cream   Topical BID  . latanoprost  1 drop Both Eyes QHS  . loratadine  10 mg Oral Daily  . multivitamin with minerals  1 tablet Oral Daily  . pantoprazole  40 mg Oral Daily  . pneumococcal 23 valent vaccine  0.5 mL Intramuscular Tomorrow-1000  . zinc sulfate  220 mg Oral Daily   Continuous Infusions: . remdesivir 100 mg in NS 100 mL Stopped (07/17/20 2005)   PRN Meds:.acetaminophen **OR** [DISCONTINUED] acetaminophen, albuterol, bisacodyl, calcium carbonate (dosed in mg elemental calcium), docusate sodium, feeding supplement (NEPRO CARB STEADY), guaiFENesin-dextromethorphan, hydrALAZINE, [DISCONTINUED] ondansetron **OR** ondansetron (ZOFRAN) IV, oxyCODONE, polyethylene glycol, sorbitol, zolpidem  Antibiotics  :    Anti-infectives (From admission, onward)   Start     Dose/Rate Route Frequency Ordered Stop   07/15/20 1215  cefTRIAXone (ROCEPHIN) 1 g in sodium chloride 0.9 % 100 mL IVPB        1 g 200 mL/hr over 30 Minutes Intravenous Every 24 hours 07/15/20 1116 07/17/20 2005   07/15/20  1000  remdesivir 100 mg in sodium chloride 0.9 % 100 mL IVPB  Status:  Discontinued       "Followed by" Linked Group Details   100 mg 200 mL/hr over 30 Minutes Intravenous Daily 07/14/20 1449 07/14/20 1451   07/15/20 1000  remdesivir 100 mg in sodium chloride 0.9 % 100 mL IVPB       "Followed by" Linked Group Details   100 mg 200 mL/hr over 30 Minutes Intravenous Daily 07/14/20 1451 07/19/20 0959   07/14/20 1600  remdesivir 200 mg in sodium chloride 0.9% 250 mL IVPB       "Followed by" Linked Group Details   200 mg 580 mL/hr over 30 Minutes Intravenous Once 07/14/20 1451 07/14/20 1900   07/14/20 1500  remdesivir 200 mg in sodium chloride 0.9% 250 mL IVPB  Status:  Discontinued       "Followed by" Linked Group Details   200 mg 580 mL/hr over 30 Minutes Intravenous Once 07/14/20 1449 07/14/20 1451       Time Spent in minutes  30   Susa Raring M.D on 07/18/2020 at 9:07 AM  To page go to [email or web address]   [facility] -  Office  [phone]  See all Orders from today for further details    Objective:   Vitals:   07/17/20 1955 07/18/20 0012 07/18/20 0411 07/18/20 0753  BP: (!) 143/102 (!) 133/98 (!) 123/92 (!) 128/91  Pulse: 100 97 92 95  Resp: 20 15 20 15   Temp: 97.7 F (36.5 C) 98 F (36.7 C) 97.6 F (36.4 C) 97.6 F (36.4 C)  TempSrc: Oral Oral Oral Oral  SpO2: 96% 96% 94% 95%  Weight:      Height:        Wt Readings from Last 3 Encounters:  07/14/20 79.4 kg     Intake/Output Summary (Last 24 hours) at 07/18/2020 0981 Last data filed at 07/17/2020 1801 Gross per 24 hour  Intake 260 ml  Output -  Net 260 ml     Physical Exam  Awake Alert, No new F.N deficits, Chr L.sided weakness Leg >> arm Howard.AT,PERRAL Supple Neck,No JVD, No cervical lymphadenopathy appriciated.  Symmetrical Chest wall movement, Good air movement bilaterally, CTAB RRR,No Gallops, Rubs or  new Murmurs, No Parasternal Heave +ve B.Sounds, Abd Soft, No tenderness, No organomegaly  appriciated, No rebound - guarding or rigidity. No Cyanosis, Clubbing or edema, does have some diffuse tenderness in both feet to touch     Data Review:    CBC Recent Labs  Lab 07/14/20 0701 07/15/20 0034 07/16/20 0205 07/17/20 0453  WBC 11.1* 10.1 19.8* 18.1*  HGB 13.9 11.3* 10.5* 10.2*  HCT 43.6 33.2* 32.1* 31.9*  PLT 533* 295 438* 426*  MCV 89.5 88.5 88.9 88.9  MCH 28.5 30.1 29.1 28.4  MCHC 31.9 34.0 32.7 32.0  RDW 13.0 13.1 13.0 13.1  LYMPHSABS  --  1.0 1.6 2.4  MONOABS  --  0.3 1.5* 1.1*  EOSABS  --  0.0 0.0 0.0  BASOSABS  --  0.0 0.0 0.0    Recent Labs  Lab 07/14/20 0701 07/14/20 1119 07/15/20 0034 07/16/20 0205 07/17/20 0453 07/18/20 0335  NA 136  --  139 140 140  --   K 3.8  --  4.3 4.1 3.8  --   CL 94*  --  105 106 106  --   CO2 26  --  21* 23 23  --   GLUCOSE 118*  --  164* 113* 133*  --   BUN 60*  --  49* 44* 41*  --   CREATININE 4.80*  --  2.64* 1.55* 1.47*  --   CALCIUM 9.9  --  8.4* 9.2 8.8*  --   AST  --   --  36 32 28  --   ALT  --   --  27 26 28   --   ALKPHOS  --   --  37* 44 42  --   BILITOT  --   --  0.5 0.2* 0.7  --   ALBUMIN  --   --  2.9* 2.7* 2.4*  --   MG  --   --  1.8 1.7 1.6* 1.9  CRP  --  4.1* 3.3* 2.5* 1.2* 1.1*  DDIMER  --  8.92* 7.25* 3.96* 5.50* 3.65*  PROCALCITON 0.18  --   --   --   --   --   LATICACIDVEN  --  1.4  --   --   --   --   BNP  --   --  6.5 267.7* 247.5*  --     ------------------------------------------------------------------------------------------------------------------ No results for input(s): CHOL, HDL, LDLCALC, TRIG, CHOLHDL, LDLDIRECT in the last 72 hours.  No results found for: HGBA1C ------------------------------------------------------------------------------------------------------------------ No results for input(s): TSH, T4TOTAL, T3FREE, THYROIDAB in the last 72 hours.  Invalid input(s): FREET3  Cardiac Enzymes No results for input(s): CKMB, TROPONINI, MYOGLOBIN in the last 168  hours.  Invalid input(s): CK ------------------------------------------------------------------------------------------------------------------    Component Value Date/Time   BNP 247.5 (H) 07/17/2020 0453    Micro Results Recent Results (from the past 240 hour(s))  Blood Culture (routine x 2)     Status: None (Preliminary result)   Collection Time: 07/14/20 11:19 AM   Specimen: BLOOD  Result Value Ref Range Status   Specimen Description BLOOD RIGHT ANTECUBITAL  Final   Special Requests   Final    BOTTLES DRAWN AEROBIC AND ANAEROBIC Blood Culture adequate volume   Culture   Final    NO GROWTH 3 DAYS Performed at [facility], [address] [place], [place]    Report Status PENDING  Incomplete  Blood Culture (routine x 2)     Status: None (Preliminary  result)   Collection Time: 07/14/20  4:43 PM   Specimen: BLOOD RIGHT HAND  Result Value Ref Range Status   Specimen Description BLOOD RIGHT HAND  Final   Special Requests   Final    BOTTLES DRAWN AEROBIC ONLY Blood Culture results may not be optimal due to an inadequate volume of blood received in culture bottles   Culture   Final    NO GROWTH 3 DAYS Performed at [facility], [address] [place], [place] [address]    Report Status PENDING  Incomplete  Culture, Urine     Status: None   Collection Time: 07/14/20  5:52 PM   Specimen: Urine, Random  Result Value Ref Range Status   Specimen Description URINE, RANDOM  Final   Special Requests NONE  Final   Culture   Final    NO GROWTH Performed at [facility], [address] [place], [place] [address]    Report Status 07/16/2020 FINAL  Final  MRSA PCR Screening     Status: None   Collection Time: 07/15/20  5:32 AM   Specimen: Nasal Mucosa; Nasopharyngeal  Result Value Ref Range Status   MRSA by PCR NEGATIVE NEGATIVE Final    Comment:        The GeneXpert MRSA Assay (FDA approved for NASAL specimens only), is one component of  a comprehensive MRSA colonization surveillance program. It is not intended to diagnose MRSA infection nor to guide or monitor treatment for MRSA infections. Performed at [facility], [address] [place], [place] [address]     Radiology Reports DG Chest 2 View  Result Date: 07/05/2020 CLINICAL DATA:  Shortness of breath and fatigue EXAM: CHEST - 2 VIEW COMPARISON:  None. FINDINGS: Indistinct densities at the lung bases on the frontal view. No edema, effusion, or pneumothorax. Normal heart size and mediastinal contours. IMPRESSION: Mild infiltrate or atelectasis at the lung bases, lateral view and low volumes favoring atelectasis. Electronically Signed   By: Marnee SpringJonathon  Watts M.D.   On: 07/05/2020 10:26   CT Head Wo Contrast  Result Date: 07/05/2020 CLINICAL DATA:  Worsening left-sided weakness (history of CVA) also with history of nontraumatic ICH. EXAM: CT HEAD WITHOUT CONTRAST TECHNIQUE: Contiguous axial images were obtained from the base of the skull through the vertex without intravenous contrast. COMPARISON:  No pertinent prior exams available for comparison. FINDINGS: Brain: Cerebral volume is normal for age. There is a subcentimeter hypodensity within the right thalamocapsular junction with subtle peripheral curvilinear hyperdensity (for instance as seen on series 3, images 15-17). There are chronic lacunar infarcts within the left corona radiata and left pons. No demarcated cortical infarct. No extra-axial fluid collection. No evidence of intracranial mass. No midline shift. Vascular: No hyperdense vessel.  Atherosclerotic calcifications Skull: Normal. Negative for fracture or focal lesion. Sinuses/Orbits: Visualized orbits show no acute finding. No significant paranasal sinus disease at the imaged levels. These results were called by telephone at the time of interpretation on 07/05/2020 at 11:42 am to provider [facility] , who verbally acknowledged these results. IMPRESSION:  Subcentimeter hypodensity within the right thalamocapsular junction with subtle peripheral curvilinear hyperdensity. This is favored to reflect subacute/chronic blood products at site of a subacute/chronic lacunar infarct. However, correlation with the patient's history and any available prior outside imaging is necessary. If this does not correlate with the patient's history and outside imaging, a contrast-enhanced brain MRI is recommended for further evaluation. Chronic lacunar infarcts within the left corona  radiata and left basal ganglia. Electronically Signed   By: Jackey Loge DO   On: 07/05/2020 11:43   MR BRAIN WO CONTRAST  Result Date: 07/05/2020 CLINICAL DATA:  Neuro deficit, acute, stroke suspected History of thalamic ICH EXAM: MRI HEAD WITHOUT CONTRAST TECHNIQUE: Multiplanar, multiecho pulse sequences of the brain and surrounding structures were obtained without intravenous contrast. COMPARISON:  07/05/2020. FINDINGS: Brain: No focal restricted diffusion. Remote microhemorrhages involving the left thalamus, cerebellum and left basal ganglia. Sequela of remote insult involving the right thalamus with associated hemosiderin deposition. No midline shift, ventriculomegaly or extra-axial fluid collection. No mass lesion. Chronic left corona radiata and pontine lacunar insults. Minimal chronic microvascular ischemic changes. Vascular: Normal flow voids. Skull and upper cervical spine: Normal marrow signal. Sinuses/Orbits: Normal orbits. Pneumatized paranasal sinuses. Bilateral mastoid free fluid. Other: None. IMPRESSION: Sequela of prior right thalamic hemorrhage. No acute intracranial hemorrhage or infarct. Remote insults involving the left corona radiata and pons. Sequela of hypertensive microangiopathy. Electronically Signed   By: Stana Bunting M.D.   On: 07/05/2020 16:14   NM Pulmonary Perfusion  Result Date: 07/15/2020 CLINICAL DATA:  Elevated D-dimer, high risk of pulmonary embolism due to  recent COVID infection EXAM: NUCLEAR MEDICINE PERFUSION LUNG SCAN TECHNIQUE: Perfusion images were obtained in multiple projections after intravenous injection of radiopharmaceutical. Ventilation scans intentionally deferred if perfusion scan and chest x-ray adequate for interpretation during COVID 19 epidemic. RADIOPHARMACEUTICALS:  4.3 mCi Tc-54m MAA IV COMPARISON:  Chest radiograph 07/15/2020 FINDINGS: Normal perfusion lung scan. No perfusion defects identified. IMPRESSION: Normal perfusion lung scan. Electronically Signed   By: Ulyses Southward M.D.   On: 07/15/2020 15:54   US RENAL  Result Date: 07/14/2020 CLINICAL DATA:  Acute renal injury EXAM: RENAL / URINARY TRACT ULTRASOUND COMPLETE COMPARISON:  None. FINDINGS: Right Kidney: Renal measurements: 9.4 x 4.0 x 4.3 cm. = volume: 83 mL. Echogenicity within normal limits. No mass or hydronephrosis visualized. Left Kidney: Renal measurements: 10.8 x 5.2 x 4.2 cm. = volume: 124 mL. Echogenicity within normal limits. No mass or hydronephrosis visualized. Bladder: Appears normal for degree of bladder distention. Other: None. IMPRESSION: No acute abnormality noted. Electronically Signed   By: Alcide Clever M.D.   On: 07/14/2020 21:15   DG Chest Port 1 View  Result Date: 07/15/2020 CLINICAL DATA:  Shortness of breath EXAM: PORTABLE CHEST 1 VIEW COMPARISON:  07/14/2020 FINDINGS: Numerous leads and wires project over the chest. Midline trachea. Normal heart size. Atherosclerosis in the transverse aorta. No pleural effusion or pneumothorax. Clear lungs. IMPRESSION: No acute cardiopulmonary disease. Aortic Atherosclerosis (ICD10-I70.0). Electronically Signed   By: Jeronimo Greaves M.D.   On: 07/15/2020 09:08   DG Chest Portable 1 View  Result Date: 07/14/2020 CLINICAL DATA:  Dyspnea, COVID pneumonia EXAM: PORTABLE CHEST 1 VIEW COMPARISON:  07/05/2020 FINDINGS: The heart size and mediastinal contours are within normal limits. Both lungs are clear. The visualized skeletal  structures are unremarkable. IMPRESSION: No active disease. Electronically Signed   By: Helyn Numbers MD   On: 07/14/2020 10:43   VAS [name] LOWER EXTREMITY VENOUS (DVT)  Result Date: 07/15/2020  Lower Venous DVT Study Indications: Edema.  Comparison Study: no prior Performing Technologist: Blanch Media RVS  Examination Guidelines: A complete evaluation includes B-mode imaging, spectral Doppler, color Doppler, and power Doppler as needed of all accessible portions of each vessel. Bilateral testing is considered an integral part of a complete examination. Limited examinations for reoccurring indications may be performed as noted. The reflux portion of  the exam is performed with the patient in reverse Trendelenburg.  +---------+---------------+---------+-----------+----------+--------------+ RIGHT    CompressibilityPhasicitySpontaneityPropertiesThrombus Aging +---------+---------------+---------+-----------+----------+--------------+ CFV      Full           Yes      Yes                                 +---------+---------------+---------+-----------+----------+--------------+ SFJ      Full                                                        +---------+---------------+---------+-----------+----------+--------------+ FV Prox  Full                                                        +---------+---------------+---------+-----------+----------+--------------+ FV Mid   Full                                                        +---------+---------------+---------+-----------+----------+--------------+ FV DistalFull                                                        +---------+---------------+---------+-----------+----------+--------------+ PFV      Full                                                        +---------+---------------+---------+-----------+----------+--------------+ POP      Full           Yes      Yes                                  +---------+---------------+---------+-----------+----------+--------------+ PTV      Full                                                        +---------+---------------+---------+-----------+----------+--------------+ PERO     Full                                                        +---------+---------------+---------+-----------+----------+--------------+   +---------+---------------+---------+-----------+----------+--------------+ LEFT     CompressibilityPhasicitySpontaneityPropertiesThrombus Aging +---------+---------------+---------+-----------+----------+--------------+ CFV      Full           Yes      Yes                                 +---------+---------------+---------+-----------+----------+--------------+  SFJ      Full                                                        +---------+---------------+---------+-----------+----------+--------------+ FV Prox  Full                                                        +---------+---------------+---------+-----------+----------+--------------+ FV Mid   Full                                                        +---------+---------------+---------+-----------+----------+--------------+ FV DistalFull                                                        +---------+---------------+---------+-----------+----------+--------------+ PFV      Full                                                        +---------+---------------+---------+-----------+----------+--------------+ POP      Full           Yes      Yes                                 +---------+---------------+---------+-----------+----------+--------------+ PTV      Full                                                        +---------+---------------+---------+-----------+----------+--------------+ PERO     Full                                                         +---------+---------------+---------+-----------+----------+--------------+     Summary: BILATERAL: - No evidence of deep vein thrombosis seen in the lower extremities, bilaterally. - No evidence of superficial venous thrombosis in the lower extremities, bilaterally. -No evidence of popliteal cyst, bilaterally.   *See table(s) above for measurements and observations. Electronically signed by Heath Lark on 07/15/2020 at 3:49:54 PM.    Final

## 2020-07-18 NOTE — TOC Progression Note (Signed)
Transition of Care [facility]) - Progression Note    Patient Details  Name: Shirley Hansen MRN: [id number] Date of Birth: September 07, 1965  [facility] CM/SW Contact  Jimmy Picket, [place] Phone Number: 07/18/2020, 10:02 AM  Clinical Narrative:     Pt does not have bed offers at this time. TOC will continue to follow.   Expected Discharge Plan: Skilled Nursing Facility Barriers to Discharge: SNF Pending bed offer,SNF Covid  Expected Discharge Plan and Services Expected Discharge Plan: Skilled Nursing Facility In-house Referral: Clinical Social Work   Post Acute Care Choice: Skilled Nursing Facility Living arrangements for the past 2 months: Apartment,Single Family Home                                       Social Determinants of Health (SDOH) Interventions    Readmission Risk Interventions No flowsheet data found.  Jimmy Picket, Theresia Majors, [place] Clinical Social Worker [phone]

## 2020-07-18 NOTE — Plan of Care (Signed)
  Problem: Education: Goal: Knowledge of risk factors and measures for prevention of condition will improve Outcome: Progressing   Problem: Coping: Goal: Psychosocial and spiritual needs will be supported Outcome: Progressing   Problem: Education: Goal: Knowledge of General Education information will improve Description: Including pain rating scale, medication(s)/side effects and non-pharmacologic comfort measures Outcome: Progressing   Problem: Health Behavior/Discharge Planning: Goal: Ability to manage health-related needs will improve Outcome: Progressing   Problem: Clinical Measurements: Goal: Ability to maintain clinical measurements within normal limits will improve Outcome: Progressing Goal: Will remain free from infection Outcome: Progressing Goal: Diagnostic test results will improve Outcome: Progressing   Problem: Elimination: Goal: Will not experience complications related to bowel motility Outcome: Progressing Goal: Will not experience complications related to urinary retention Outcome: Progressing   Problem: Skin Integrity: Goal: Risk for impaired skin integrity will decrease Outcome: Progressing   Problem: Activity: Goal: Risk for activity intolerance will decrease Outcome: Not Progressing   Problem: Pain Managment: Goal: General experience of comfort will improve Outcome: Not Progressing

## 2020-07-19 DIAGNOSIS — N179 Acute kidney failure, unspecified: Secondary | ICD-10-CM | POA: Diagnosis not present

## 2020-07-19 DIAGNOSIS — I10 Essential (primary) hypertension: Secondary | ICD-10-CM | POA: Diagnosis not present

## 2020-07-19 DIAGNOSIS — U071 COVID-19: Secondary | ICD-10-CM | POA: Diagnosis not present

## 2020-07-19 DIAGNOSIS — R0602 Shortness of breath: Secondary | ICD-10-CM | POA: Diagnosis not present

## 2020-07-19 LAB — BASIC METABOLIC PANEL
Anion gap: 10 (ref 5–15)
BUN: 28 mg/dL — ABNORMAL HIGH (ref 6–20)
CO2: 26 mmol/L (ref 22–32)
Calcium: 8.9 mg/dL (ref 8.9–10.3)
Chloride: 104 mmol/L (ref 98–111)
Creatinine, Ser: 1.45 mg/dL — ABNORMAL HIGH (ref 0.44–1.00)
GFR, Estimated: 43 mL/min — ABNORMAL LOW (ref >60)
Glucose, Bld: 147 mg/dL — ABNORMAL HIGH (ref 70–99)
Potassium: 4.3 mmol/L (ref 3.5–5.1)
Sodium: 140 mmol/L (ref 135–145)

## 2020-07-19 LAB — CULTURE, BLOOD (ROUTINE X 2)
Culture: NO GROWTH
Culture: NO GROWTH
Special Requests: ADEQUATE

## 2020-07-19 LAB — D-DIMER, QUANTITATIVE: D-Dimer, Quant: 2.63 ug/mL-FEU — ABNORMAL HIGH (ref 0.00–0.50)

## 2020-07-19 LAB — C-REACTIVE PROTEIN: CRP: 0.8 mg/dL (ref <1.0)

## 2020-07-19 LAB — MAGNESIUM: Magnesium: 1.9 mg/dL (ref 1.7–2.4)

## 2020-07-19 MED ORDER — METHYLPREDNISOLONE SODIUM SUCC 125 MG IJ SOLR
60.0000 mg | Freq: Once | INTRAMUSCULAR | Status: AC
  Administered 2020-07-19: 60 mg via INTRAVENOUS
  Filled 2020-07-19: qty 2

## 2020-07-19 MED ORDER — COLCHICINE 0.6 MG PO TABS
0.6000 mg | ORAL_TABLET | Freq: Once | ORAL | Status: AC
  Administered 2020-07-19: 0.6 mg via ORAL
  Filled 2020-07-19: qty 1

## 2020-07-19 NOTE — Progress Notes (Signed)
PROGRESS NOTE                                                                                                                                                                                                             Patient Demographics:    Shirley Hansen, is a 55 y.o. female, DOB - [birth year], ZOX:[id number]  Outpatient Primary MD for the patient is Pcp, No    LOS - 5  Admit date - 07/14/2020    Chief Complaint  Patient presents with  . Back Pain  . Weakness    Covid        Brief Narrative (HPI from H&P)  - Shirley Hansen is a 55 y.o. female with medical history significant of CVA with left-sided deficits; HTN; and stage 3a CKD presenting with worsening COVID symptoms, positive on 1/24.  She was seen in the ER on 1/24 for progressive and generalized weakness, she had no shortness of breath her main symptom was generalized weakness, in the ER her work-up was consistent with severe dehydration she was found to have extremely high D-dimer, AKI, hypotension and she was admitted   Subjective:   Patient in bed, appears comfortable, denies any headache, no fever, no chest pain or pressure, no shortness of breath , no abdominal pain. Chronic left-sided weakness, mildly improved bilateral foot pain.    Assessment  & Plan :   1. Acute COVID-19 infection - she has received 1 shot of Pfizer vaccine recently, she has minimal pulmonary involvement and symptoms are more generalized with severe dehydration and AKI.  CRP is borderline but her D-dimer is extremely elevated.  She will be treated with IV fluids, low-dose steroids and Remdesivir.  With rapid tapering of steroids.  Encouraged the patient to sit up in chair in the daytime use I-S and flutter valve for pulmonary toiletry and then prone in bed when at night.  Will advance activity and titrate down oxygen as possible.   SpO2: 97 %  Recent Labs  Lab 07/14/20 0701 07/14/20 1119  07/14/20 1119 07/15/20 0034 07/16/20 0205 07/17/20 0453 07/18/20 0335 07/19/20 0509  WBC 11.1*  --   --  10.1 19.8* 18.1*  --   --   HGB 13.9  --   --  11.3* 10.5* 10.2*  --   --   HCT 43.6  --   --  33.2* 32.1* 31.9*  --   --   PLT 533*  --   --  295 438* 426*  --   --   CRP  --  4.1*   < > 3.3* 2.5* 1.2* 1.1* 0.8  BNP  --   --   --  6.5 267.7* 247.5*  --   --   DDIMER  --  8.92*   < > 7.25* 3.96* 5.50* 3.65* 2.63*  PROCALCITON 0.18  --   --   --   --   --   --   --   AST  --   --   --  36 32 28  --   --   ALT  --   --   --  27 26 28   --   --   ALKPHOS  --   --   --  37* 44 42  --   --   BILITOT  --   --   --  0.5 0.2* 0.7  --   --   ALBUMIN  --   --   --  2.9* 2.7* 2.4*  --   --   LATICACIDVEN  --  1.4  --   --   --   --   --   --    < > = values in this interval not displayed.    2.  Dehydration with AKI.  Hydrate with IV fluids avoid nephrotoxins.  She has underlying CKD 3A with baseline creatinine around 1.5.  3.  History of CVA with left-sided hemiparesis.  Supportive care.  Continue aspirin statin for secondary prevention, PT OT as needed.  4.  High D-dimer with severe dehydration.  High risk for developing a blood clot, leg ultrasound & VQ negative for now continue on moderate dose Lovenox.  5.  Obesity with BMI 32.  Follow with PCP for weight loss.  6.  Possible UTI.  3 days of Rocephin, follow cultures.  7. Hypomagnesemia. Replaced on 07/17/2020.  8. Acute gout affecting both feet.  X-ray unremarkable, uric acid level elevated, placed on colchicine and steroids, monitor.      Condition -   Guarded  Family Communication  : Niece 09/14/2020 [phone] on 07/15/2020 - wrong number  Code Status :  Full  Consults  :  PCCM  Procedures  :    Leg 09/12/2020  -ve  VQ Scan  -ve  PUD Prophylaxis : PPI  Disposition Plan  :    Status is: Inpatient  Remains inpatient appropriate because:IV treatments appropriate due to intensity of illness or inability to take PO   Dispo:  The patient is from: Home              Anticipated d/c is to: Home              Anticipated d/c date is: > 3 days              Patient currently is not medically stable to d/c.   Difficult to place patient No   DVT Prophylaxis  :  Hep gtt >> Lovenox  Lab Results  Component Value Date   PLT 426 (H) 07/17/2020    Diet :  Diet Order            Diet regular Room service appropriate? Yes; Fluid consistency: Thin  Diet effective now                  Inpatient Medications  Scheduled Meds: . amLODipine  10 mg Oral Daily  . vitamin C  500 mg Oral Daily  . aspirin EC  81 mg Oral Daily  . atorvastatin  40 mg Oral QHS  . brimonidine  1 drop Both Eyes BID  . colchicine  0.6 mg Oral Daily  . colchicine  0.6 mg Oral Once  . dorzolamide-timolol  1 drop Both Eyes BID  . enoxaparin (LOVENOX) injection  40 mg Subcutaneous Q12H  . feeding supplement  237 mL Oral BID BM  . gabapentin  300 mg Oral BID  . hydrocortisone cream   Topical BID  . latanoprost  1 drop Both Eyes QHS  . loratadine  10 mg Oral Daily  . multivitamin with minerals  1 tablet Oral Daily  . pantoprazole  40 mg Oral Daily  . pneumococcal 23 valent vaccine  0.5 mL Intramuscular Tomorrow-1000  . zinc sulfate  220 mg Oral Daily   Continuous Infusions:  PRN Meds:.acetaminophen **OR** [DISCONTINUED] acetaminophen, albuterol, calcium carbonate (dosed in mg elemental calcium), docusate sodium, feeding supplement (NEPRO CARB STEADY), guaiFENesin-dextromethorphan, hydrALAZINE, [DISCONTINUED] ondansetron **OR** ondansetron (ZOFRAN) IV, oxyCODONE, polyethylene glycol, sorbitol, zolpidem  Antibiotics  :    Anti-infectives (From admission, onward)   Start     Dose/Rate Route Frequency Ordered Stop   07/15/20 1215  cefTRIAXone (ROCEPHIN) 1 g in sodium chloride 0.9 % 100 mL IVPB        1 g 200 mL/hr over 30 Minutes Intravenous Every 24 hours 07/15/20 1116 07/17/20 2005   07/15/20 1000  remdesivir 100 mg in sodium chloride 0.9 %  100 mL IVPB  Status:  Discontinued       "Followed by" Linked Group Details   100 mg 200 mL/hr over 30 Minutes Intravenous Daily 07/14/20 1449 07/14/20 1451   07/15/20 1000  remdesivir 100 mg in sodium chloride 0.9 % 100 mL IVPB       "Followed by" Linked Group Details   100 mg 200 mL/hr over 30 Minutes Intravenous Daily 07/14/20 1451 07/18/20 0942   07/14/20 1600  remdesivir 200 mg in sodium chloride 0.9% 250 mL IVPB       "Followed by" Linked Group Details   200 mg 580 mL/hr over 30 Minutes Intravenous Once 07/14/20 1451 07/14/20 1900   07/14/20 1500  remdesivir 200 mg in sodium chloride 0.9% 250 mL IVPB  Status:  Discontinued       "Followed by" Linked Group Details   200 mg 580 mL/hr over 30 Minutes Intravenous Once 07/14/20 1449 07/14/20 1451       Time Spent in minutes  30   Susa Raring M.D on 07/19/2020 at 11:36 AM  To page go to [email or web address]   [facility] -  Office  [phone]  See all Orders from today for further details    Objective:   Vitals:   07/18/20 1953 07/19/20 0105 07/19/20 0350 07/19/20 0850  BP: 118/89 (!) 143/95 120/83 (!) 141/93  Pulse: 100 (!) 104 97 (!) 105  Resp: 20 20 18 16   Temp: 98.2 F (36.8 C) 98.5 F (36.9 C) 98 F (36.7 C) 98.4 F (36.9 C)  TempSrc: Oral Oral Oral Axillary  SpO2: 98% 94% 94% 97%  Weight:      Height:        Wt Readings from Last 3 Encounters:  07/14/20 79.4 kg     Intake/Output Summary (Last 24 hours) at 07/19/2020 1136 Last data filed at 07/19/2020 0900 Gross per 24 hour  Intake 678.76  ml  Output 850 ml  Net -171.24 ml     Physical Exam  Awake Alert,   [place].AT,PERRAL Supple Neck,No JVD, No cervical lymphadenopathy appriciated.  Symmetrical Chest wall movement, Good air movement bilaterally, CTAB RRR,No Gallops, Rubs or new Murmurs, No Parasternal Heave +ve B.Sounds, Abd Soft, No tenderness, No organomegaly appriciated, No rebound - guarding or rigidity. No Cyanosis, chronic left-sided  weakness from previous stroke, bilateral diffusely tender feet left more than right      Data Review:    CBC Recent Labs  Lab 07/14/20 0701 07/15/20 0034 07/16/20 0205 07/17/20 0453  WBC 11.1* 10.1 19.8* 18.1*  HGB 13.9 11.3* 10.5* 10.2*  HCT 43.6 33.2* 32.1* 31.9*  PLT 533* 295 438* 426*  MCV 89.5 88.5 88.9 88.9  MCH 28.5 30.1 29.1 28.4  MCHC 31.9 34.0 32.7 32.0  RDW 13.0 13.1 13.0 13.1  LYMPHSABS  --  1.0 1.6 2.4  MONOABS  --  0.3 1.5* 1.1*  EOSABS  --  0.0 0.0 0.0  BASOSABS  --  0.0 0.0 0.0    Recent Labs  Lab 07/14/20 0701 07/14/20 1119 07/14/20 1119 07/15/20 0034 07/16/20 0205 07/17/20 0453 07/18/20 0335 07/19/20 0509  NA 136  --   --  139 140 140  --  140  K 3.8  --   --  4.3 4.1 3.8  --  4.3  CL 94*  --   --  105 106 106  --  104  CO2 26  --   --  21* 23 23  --  26  GLUCOSE 118*  --   --  164* 113* 133*  --  147*  BUN 60*  --   --  49* 44* 41*  --  28*  CREATININE 4.80*  --   --  2.64* 1.55* 1.47*  --  1.45*  CALCIUM 9.9  --   --  8.4* 9.2 8.8*  --  8.9  AST  --   --   --  36 32 28  --   --   ALT  --   --   --  27 26 28   --   --   ALKPHOS  --   --   --  37* 44 42  --   --   BILITOT  --   --   --  0.5 0.2* 0.7  --   --   ALBUMIN  --   --   --  2.9* 2.7* 2.4*  --   --   MG  --   --   --  1.8 1.7 1.6* 1.9 1.9  CRP  --  4.1*   < > 3.3* 2.5* 1.2* 1.1* 0.8  DDIMER  --  8.92*   < > 7.25* 3.96* 5.50* 3.65* 2.63*  PROCALCITON 0.18  --   --   --   --   --   --   --   LATICACIDVEN  --  1.4  --   --   --   --   --   --   BNP  --   --   --  6.5 267.7* 247.5*  --   --    < > = values in this interval not displayed.    ------------------------------------------------------------------------------------------------------------------ No results for input(s): CHOL, HDL, LDLCALC, TRIG, CHOLHDL, LDLDIRECT in the last 72 hours.  No results found for:  HGBA1C ------------------------------------------------------------------------------------------------------------------ No results for input(s): TSH, T4TOTAL, T3FREE, THYROIDAB in the last 72 hours.  Invalid  input(s): FREET3  Cardiac Enzymes No results for input(s): CKMB, TROPONINI, MYOGLOBIN in the last 168 hours.  Invalid input(s): CK ------------------------------------------------------------------------------------------------------------------    Component Value Date/Time   BNP 247.5 (H) 07/17/2020 0453    Micro Results Recent Results (from the past 240 hour(s))  Blood Culture (routine x 2)     Status: None   Collection Time: 07/14/20 11:19 AM   Specimen: BLOOD  Result Value Ref Range Status   Specimen Description BLOOD RIGHT ANTECUBITAL  Final   Special Requests   Final    BOTTLES DRAWN AEROBIC AND ANAEROBIC Blood Culture adequate volume   Culture   Final    NO GROWTH 5 DAYS Performed at [facility], [address] [place], [place] [address]    Report Status 07/19/2020 FINAL  Final  Blood Culture (routine x 2)     Status: None   Collection Time: 07/14/20  4:43 PM   Specimen: BLOOD RIGHT HAND  Result Value Ref Range Status   Specimen Description BLOOD RIGHT HAND  Final   Special Requests   Final    BOTTLES DRAWN AEROBIC ONLY Blood Culture results may not be optimal due to an inadequate volume of blood received in culture bottles   Culture   Final    NO GROWTH 5 DAYS Performed at [facility], [address], [place], [place] [address]    Report Status 07/19/2020 FINAL  Final  Culture, Urine     Status: None   Collection Time: 07/14/20  5:52 PM   Specimen: Urine, Random  Result Value Ref Range Status   Specimen Description URINE, RANDOM  Final   Special Requests NONE  Final   Culture   Final    NO GROWTH Performed at [facility], [address], [place], [place] [address]    Report Status 07/16/2020 FINAL  Final  MRSA PCR Screening      Status: None   Collection Time: 07/15/20  5:32 AM   Specimen: Nasal Mucosa; Nasopharyngeal  Result Value Ref Range Status   MRSA by PCR NEGATIVE NEGATIVE Final    Comment:        The GeneXpert MRSA Assay (FDA approved for NASAL specimens only), is one component of a comprehensive MRSA colonization surveillance program. It is not intended to diagnose MRSA infection nor to guide or monitor treatment for MRSA infections. Performed at [facility], [address], [place], [place] [address]     Radiology Reports DG Chest 2 View  Result Date: 07/05/2020 CLINICAL DATA:  Shortness of breath and fatigue EXAM: CHEST - 2 VIEW COMPARISON:  None. FINDINGS: Indistinct densities at the lung bases on the frontal view. No edema, effusion, or pneumothorax. Normal heart size and mediastinal contours. IMPRESSION: Mild infiltrate or atelectasis at the lung bases, lateral view and low volumes favoring atelectasis. Electronically Signed   By: Marnee Spring M.D.   On: 07/05/2020 10:26   CT Head Wo Contrast  Result Date: 07/05/2020 CLINICAL DATA:  Worsening left-sided weakness (history of CVA) also with history of nontraumatic ICH. EXAM: CT HEAD WITHOUT CONTRAST TECHNIQUE: Contiguous axial images were obtained from the base of the skull through the vertex without intravenous contrast. COMPARISON:  No pertinent prior exams available for comparison. FINDINGS: Brain: Cerebral volume is normal for age. There is a subcentimeter hypodensity within the right thalamocapsular junction with subtle peripheral curvilinear hyperdensity (for instance as seen on series 3, images 15-17). There are chronic lacunar infarcts within  the left corona radiata and left pons. No demarcated cortical infarct. No extra-axial fluid collection. No evidence of intracranial mass. No midline shift. Vascular: No hyperdense vessel.  Atherosclerotic calcifications Skull: Normal. Negative for fracture or focal lesion. Sinuses/Orbits:  Visualized orbits show no acute finding. No significant paranasal sinus disease at the imaged levels. These results were called by telephone at the time of interpretation on 07/05/2020 at 11:42 am to provider [facility] , who verbally acknowledged these results. IMPRESSION: Subcentimeter hypodensity within the right thalamocapsular junction with subtle peripheral curvilinear hyperdensity. This is favored to reflect subacute/chronic blood products at site of a subacute/chronic lacunar infarct. However, correlation with the patient's history and any available prior outside imaging is necessary. If this does not correlate with the patient's history and outside imaging, a contrast-enhanced brain MRI is recommended for further evaluation. Chronic lacunar infarcts within the left corona radiata and left basal ganglia. Electronically Signed   By: Jackey Loge DO   On: 07/05/2020 11:43   MR BRAIN WO CONTRAST  Result Date: 07/05/2020 CLINICAL DATA:  Neuro deficit, acute, stroke suspected History of thalamic ICH EXAM: MRI HEAD WITHOUT CONTRAST TECHNIQUE: Multiplanar, multiecho pulse sequences of the brain and surrounding structures were obtained without intravenous contrast. COMPARISON:  07/05/2020. FINDINGS: Brain: No focal restricted diffusion. Remote microhemorrhages involving the left thalamus, cerebellum and left basal ganglia. Sequela of remote insult involving the right thalamus with associated hemosiderin deposition. No midline shift, ventriculomegaly or extra-axial fluid collection. No mass lesion. Chronic left corona radiata and pontine lacunar insults. Minimal chronic microvascular ischemic changes. Vascular: Normal flow voids. Skull and upper cervical spine: Normal marrow signal. Sinuses/Orbits: Normal orbits. Pneumatized paranasal sinuses. Bilateral mastoid free fluid. Other: None. IMPRESSION: Sequela of prior right thalamic hemorrhage. No acute intracranial hemorrhage or infarct. Remote insults involving  the left corona radiata and pons. Sequela of hypertensive microangiopathy. Electronically Signed   By: Stana Bunting M.D.   On: 07/05/2020 16:14   NM Pulmonary Perfusion  Result Date: 07/15/2020 CLINICAL DATA:  Elevated D-dimer, high risk of pulmonary embolism due to recent COVID infection EXAM: NUCLEAR MEDICINE PERFUSION LUNG SCAN TECHNIQUE: Perfusion images were obtained in multiple projections after intravenous injection of radiopharmaceutical. Ventilation scans intentionally deferred if perfusion scan and chest x-ray adequate for interpretation during COVID 19 epidemic. RADIOPHARMACEUTICALS:  4.3 mCi Tc-17m MAA IV COMPARISON:  Chest radiograph 07/15/2020 FINDINGS: Normal perfusion lung scan. No perfusion defects identified. IMPRESSION: Normal perfusion lung scan. Electronically Signed   By: Ulyses Southward M.D.   On: 07/15/2020 15:54   US RENAL  Result Date: 07/14/2020 CLINICAL DATA:  Acute renal injury EXAM: RENAL / URINARY TRACT ULTRASOUND COMPLETE COMPARISON:  None. FINDINGS: Right Kidney: Renal measurements: 9.4 x 4.0 x 4.3 cm. = volume: 83 mL. Echogenicity within normal limits. No mass or hydronephrosis visualized. Left Kidney: Renal measurements: 10.8 x 5.2 x 4.2 cm. = volume: 124 mL. Echogenicity within normal limits. No mass or hydronephrosis visualized. Bladder: Appears normal for degree of bladder distention. Other: None. IMPRESSION: No acute abnormality noted. Electronically Signed   By: Alcide Clever M.D.   On: 07/14/2020 21:15   DG Chest Port 1 View  Result Date: 07/15/2020 CLINICAL DATA:  Shortness of breath EXAM: PORTABLE CHEST 1 VIEW COMPARISON:  07/14/2020 FINDINGS: Numerous leads and wires project over the chest. Midline trachea. Normal heart size. Atherosclerosis in the transverse aorta. No pleural effusion or pneumothorax. Clear lungs. IMPRESSION: No acute cardiopulmonary disease. Aortic Atherosclerosis (ICD10-I70.0). Electronically Signed   By: Ronaldo Miyamoto  Reche Dixon M.D.   On: 07/15/2020  09:08   DG Chest Portable 1 View  Result Date: 07/14/2020 CLINICAL DATA:  Dyspnea, COVID pneumonia EXAM: PORTABLE CHEST 1 VIEW COMPARISON:  07/05/2020 FINDINGS: The heart size and mediastinal contours are within normal limits. Both lungs are clear. The visualized skeletal structures are unremarkable. IMPRESSION: No active disease. Electronically Signed   By: Helyn Numbers MD   On: 07/14/2020 10:43   DG Foot 2 Views Left  Result Date: 07/18/2020 CLINICAL DATA:  Left foot pain without known injury. EXAM: LEFT FOOT - 2 VIEW COMPARISON:  None. FINDINGS: There is no evidence of fracture or dislocation. There is no evidence of arthropathy or other focal bone abnormality. Soft tissues are unremarkable. IMPRESSION: Negative. Electronically Signed   By: Lupita Raider M.D.   On: 07/18/2020 10:11   VAS [name] LOWER EXTREMITY VENOUS (DVT)  Result Date: 07/15/2020  Lower Venous DVT Study Indications: Edema.  Comparison Study: no prior Performing Technologist: Blanch Media RVS  Examination Guidelines: A complete evaluation includes B-mode imaging, spectral Doppler, color Doppler, and power Doppler as needed of all accessible portions of each vessel. Bilateral testing is considered an integral part of a complete examination. Limited examinations for reoccurring indications may be performed as noted. The reflux portion of the exam is performed with the patient in reverse Trendelenburg.  +---------+---------------+---------+-----------+----------+--------------+ RIGHT    CompressibilityPhasicitySpontaneityPropertiesThrombus Aging +---------+---------------+---------+-----------+----------+--------------+ CFV      Full           Yes      Yes                                 +---------+---------------+---------+-----------+----------+--------------+ SFJ      Full                                                        +---------+---------------+---------+-----------+----------+--------------+ FV Prox  Full                                                         +---------+---------------+---------+-----------+----------+--------------+ FV Mid   Full                                                        +---------+---------------+---------+-----------+----------+--------------+ FV DistalFull                                                        +---------+---------------+---------+-----------+----------+--------------+ PFV      Full                                                        +---------+---------------+---------+-----------+----------+--------------+  POP      Full           Yes      Yes                                 +---------+---------------+---------+-----------+----------+--------------+ PTV      Full                                                        +---------+---------------+---------+-----------+----------+--------------+ PERO     Full                                                        +---------+---------------+---------+-----------+----------+--------------+   +---------+---------------+---------+-----------+----------+--------------+ LEFT     CompressibilityPhasicitySpontaneityPropertiesThrombus Aging +---------+---------------+---------+-----------+----------+--------------+ CFV      Full           Yes      Yes                                 +---------+---------------+---------+-----------+----------+--------------+ SFJ      Full                                                        +---------+---------------+---------+-----------+----------+--------------+ FV Prox  Full                                                        +---------+---------------+---------+-----------+----------+--------------+ FV Mid   Full                                                        +---------+---------------+---------+-----------+----------+--------------+ FV DistalFull                                                         +---------+---------------+---------+-----------+----------+--------------+ PFV      Full                                                        +---------+---------------+---------+-----------+----------+--------------+ POP      Full           Yes      Yes                                 +---------+---------------+---------+-----------+----------+--------------+  PTV      Full                                                        +---------+---------------+---------+-----------+----------+--------------+ PERO     Full                                                        +---------+---------------+---------+-----------+----------+--------------+     Summary: BILATERAL: - No evidence of deep vein thrombosis seen in the lower extremities, bilaterally. - No evidence of superficial venous thrombosis in the lower extremities, bilaterally. -No evidence of popliteal cyst, bilaterally.   *See table(s) above for measurements and observations. Electronically signed by Heath Lark on 07/15/2020 at 3:49:54 PM.    Final

## 2020-07-19 NOTE — Plan of Care (Signed)
  Problem: Education: Goal: Knowledge of risk factors and measures for prevention of condition will improve Outcome: Progressing   Problem: Coping: Goal: Psychosocial and spiritual needs will be supported Outcome: Progressing   Problem: Education: Goal: Knowledge of General Education information will improve Description: Including pain rating scale, medication(s)/side effects and non-pharmacologic comfort measures Outcome: Progressing   Problem: Health Behavior/Discharge Planning: Goal: Ability to manage health-related needs will improve Outcome: Progressing   Problem: Clinical Measurements: Goal: Ability to maintain clinical measurements within normal limits will improve Outcome: Progressing Goal: Will remain free from infection Outcome: Progressing Goal: Diagnostic test results will improve Outcome: Progressing   Problem: Activity: Goal: Risk for activity intolerance will decrease Outcome: Progressing   Problem: Elimination: Goal: Will not experience complications related to bowel motility Outcome: Progressing Goal: Will not experience complications related to urinary retention Outcome: Progressing   Problem: Pain Managment: Goal: General experience of comfort will improve Outcome: Progressing   Problem: Skin Integrity: Goal: Risk for impaired skin integrity will decrease Outcome: Progressing   

## 2020-07-20 DIAGNOSIS — N179 Acute kidney failure, unspecified: Secondary | ICD-10-CM | POA: Diagnosis not present

## 2020-07-20 DIAGNOSIS — I10 Essential (primary) hypertension: Secondary | ICD-10-CM | POA: Diagnosis not present

## 2020-07-20 DIAGNOSIS — R0602 Shortness of breath: Secondary | ICD-10-CM | POA: Diagnosis not present

## 2020-07-20 DIAGNOSIS — U071 COVID-19: Secondary | ICD-10-CM | POA: Diagnosis not present

## 2020-07-20 MED ORDER — COLCHICINE 0.6 MG PO TABS
0.6000 mg | ORAL_TABLET | Freq: Once | ORAL | Status: AC
  Administered 2020-07-20: 0.6 mg via ORAL
  Filled 2020-07-20: qty 1

## 2020-07-20 MED ORDER — METHYLPREDNISOLONE SODIUM SUCC 40 MG IJ SOLR
40.0000 mg | Freq: Once | INTRAMUSCULAR | Status: AC
  Administered 2020-07-20: 40 mg via INTRAVENOUS
  Filled 2020-07-20: qty 1

## 2020-07-20 NOTE — Progress Notes (Signed)
Physical Therapy Treatment Patient Details Name: Shirley Hansen MRN: [id number] DOB: Oct 03, 1965 Today's Date: 07/20/2020    History of Present Illness Pt is a 55 y.o. female who tested (+) COVID-19 on 07/05/20, now admitted 07/14/20 with generalized weakness; workup for dehydration, AKI, acute COVID-19 infection. PMH includes CVA (residual L-side deficits), HTN, CKD 3.   PT Comments    Pt progressing well with mobility. Today's session focused on transfer training and pre-gait activity with RW; pt able to perform multiple short bouts of standing activity with up to modA. Pt reports limited by bilateral foot pain (premedicated), attributing R foot pain to gout and L foot "burning" sensation to her prior stroke. Pt motivated to participate and regain PLOF, at times tearful during session regarding current situation. Continue to recommend SNF-level therapies to maximize functional mobility and independence.  SpO2 100% on RA, HR 100s-130s, post-mobility BP 147/103   Follow Up Recommendations  SNF;Supervision for mobility/OOB     Equipment Recommendations  None recommended by PT    Recommendations for Other Services       Precautions / Restrictions Precautions Precautions: Fall;Other (comment) Precaution Comments: Residual L-side weakness; chronic foot pain (pt attributes to gout and stroke) Restrictions Weight Bearing Restrictions: No    Mobility  Bed Mobility               General bed mobility comments: Received sitting in recliner  Transfers Overall transfer level: Needs assistance Equipment used: Rolling walker (2 wheeled) Transfers: Sit to/from Stand Sit to Stand: Mod assist         General transfer comment: Multiple sit<>stands from recliner to RW, increased time and effort in preparation to stand and place LUE on RW, heavy reliance on RUE to push from armrest into standing; modA for trunk elevation and stability; 1x losing grip on RW with L hand requiring assist to  reposition  Ambulation/Gait Ambulation/Gait assistance: Min assist Gait Distance (Feet): 4 Feet Assistive device: Rolling walker (2 wheeled) Gait Pattern/deviations: Step-to pattern;Trunk flexed;Decreased weight shift to left;Antalgic Gait velocity: Decreased   General Gait Details: Initial bout of marching in place, then side steps in front of recliner, progressing to steps forwards/backwards, all with RW and consistent minA for stability; pt with significant increased time and effort to take steps, decreased WBAT through L foot due to pain; 2x seated rest breaks secondary to fatigue and pain   Stairs             Wheelchair Mobility    Modified Rankin (Stroke Patients Only)       Balance Overall balance assessment: Needs assistance Sitting-balance support: Feet supported;Single extremity supported Sitting balance-Leahy Scale: Fair     Standing balance support: Single extremity supported;During functional activity Standing balance-Leahy Scale: Poor Standing balance comment: Reliant on UE support                            Cognition Arousal/Alertness: Awake/alert Behavior During Therapy: WFL for tasks assessed/performed Overall Cognitive Status: No family/caregiver present to determine baseline cognitive functioning Area of Impairment: Attention;Following commands;Safety/judgement;Awareness;Problem solving                   Current Attention Level: Selective   Following Commands: Follows one step commands consistently Safety/Judgement: Decreased awareness of deficits;Decreased awareness of safety Awareness: Emergent Problem Solving: Slow processing;Requires verbal cues General Comments: Pt verbose and self-distracts, at times becoming tearful regarding current situation, missing family, etc. Following commands appropriately. Endorses forgetfulness and  confusion at times (with regards to LUE AROM, pt reports, "I forget I need to do that")       Exercises Other Exercises Other Exercises: LUE AROM - shoulder flex/abd/ext, elbow flex/ext, wrist and finger flex/ext - pt reports LUE not moving as well as it was PTA (h/o residual weakness from stroke), educ on performing LUE AROM every hour and incorporating for ADL tasks as much as possible    General Comments General comments (skin integrity, edema, etc.): SpO2 100% on RA, HR 100s-130s. Pt c/o dizziness after last bout of standing activity, post-standing BP 147/103. At end of session, pt performed meal set-up tasks (opening juice, pouring into cup, opening straw) with cues to use LUE; pt required cues for problem solving on ways to perform task to utilize LUE successfully.      Pertinent Vitals/Pain Pain Assessment: Faces Faces Pain Scale: Hurts even more Pain Location: Bilateral feet (R-side "gout" and L-side "burning because of my stroke") Pain Descriptors / Indicators: Aching;Discomfort;Burning Pain Intervention(s): Limited activity within patient's tolerance;Premedicated before session;Repositioned    Home Living                      Prior Function            PT Goals (current goals can now be found in the care plan section) Progress towards PT goals: Progressing toward goals    Frequency    Min 2X/week      PT Plan Discharge plan needs to be updated    Co-evaluation              AM-PAC PT "6 Clicks" Mobility   Outcome Measure  Help needed turning from your back to your side while in a flat bed without using bedrails?: A Little Help needed moving from lying on your back to sitting on the side of a flat bed without using bedrails?: A Little Help needed moving to and from a bed to a chair (including a wheelchair)?: A Lot Help needed standing up from a chair using your arms (e.g., wheelchair or bedside chair)?: A Lot Help needed to walk in hospital room?: A Lot Help needed climbing 3-5 steps with a railing? : Total 6 Click Score: 13    End of  Session   Activity Tolerance: Patient tolerated treatment well Patient left: in chair;with call bell/phone within reach Nurse Communication: Mobility status PT Visit Diagnosis: Unsteadiness on feet (R26.81);Other abnormalities of gait and mobility (R26.89);Muscle weakness (generalized) (M62.81);Pain     Time: 6578-4696 PT Time Calculation (min) (ACUTE ONLY): 29 min  Charges:  $Gait Training: 8-22 mins $Therapeutic Activity: 8-22 mins                     Ina Homes, PT, DPT Acute Rehabilitation Services  Pager [phone] Office [phone]  Malachy Chamber 07/20/2020, 5:42 PM

## 2020-07-20 NOTE — TOC Progression Note (Signed)
Transition of Care [facility]) - Progression Note    Patient Details  Name: Ann Bohne MRN: [id number] Date of Birth: 29-May-1966  [facility] CM/SW Contact  Mearl Latin, LCSW Phone Number: 07/20/2020, 9:27 AM  Clinical Narrative:    No SNF bed offers. CSW will continue to follow.    Expected Discharge Plan: Skilled Nursing Facility Barriers to Discharge: SNF Pending bed offer,SNF Covid  Expected Discharge Plan and Services Expected Discharge Plan: Skilled Nursing Facility In-house Referral: Clinical Social Work   Post Acute Care Choice: Skilled Nursing Facility Living arrangements for the past 2 months: Apartment,Single Family Home                                       Social Determinants of Health (SDOH) Interventions    Readmission Risk Interventions No flowsheet data found.

## 2020-07-20 NOTE — Plan of Care (Signed)
  Problem: Education: Goal: Knowledge of risk factors and measures for prevention of condition will improve Outcome: Progressing   Problem: Coping: Goal: Psychosocial and spiritual needs will be supported Outcome: Progressing   Problem: Education: Goal: Knowledge of General Education information will improve Description: Including pain rating scale, medication(s)/side effects and non-pharmacologic comfort measures Outcome: Progressing   Problem: Health Behavior/Discharge Planning: Goal: Ability to manage health-related needs will improve Outcome: Progressing   Problem: Clinical Measurements: Goal: Ability to maintain clinical measurements within normal limits will improve Outcome: Progressing Goal: Will remain free from infection Outcome: Progressing Goal: Diagnostic test results will improve Outcome: Progressing   Problem: Activity: Goal: Risk for activity intolerance will decrease Outcome: Progressing   Problem: Elimination: Goal: Will not experience complications related to bowel motility Outcome: Progressing Goal: Will not experience complications related to urinary retention Outcome: Progressing   Problem: Pain Managment: Goal: General experience of comfort will improve Outcome: Progressing   Problem: Skin Integrity: Goal: Risk for impaired skin integrity will decrease Outcome: Progressing

## 2020-07-20 NOTE — Progress Notes (Signed)
PROGRESS NOTE                                                                                                                                                                                                             Patient Demographics:    Shirley Hansen, is a 55 y.o. female, DOB - 10-27-1965, EVO:[id number]  Outpatient Primary MD for the patient is Pcp, No    LOS - 6  Admit date - 07/14/2020    Chief Complaint  Patient presents with  . Back Pain  . Weakness    Covid        Brief Narrative (HPI from H&P)  - Shirley Hansen is a 55 y.o. female with medical history significant of CVA with left-sided deficits; HTN; and stage 3a CKD presenting with worsening COVID symptoms, positive on 1/24.  She was seen in the ER on 1/24 for progressive and generalized weakness, she had no shortness of breath her main symptom was generalized weakness, in the ER her work-up was consistent with severe dehydration she was found to have extremely high D-dimer, AKI, hypotension and she was admitted   Subjective:   Patient in bed, appears comfortable, denies any headache, no fever, no chest pain or pressure, no shortness of breath , no abdominal pain. Chronic left-sided weakness, mildly improved bilateral foot pain.   Assessment  & Plan :   1. Acute COVID-19 infection - she has received 1 shot of Pfizer vaccine recently, she has minimal pulmonary involvement and symptoms are more generalized with severe dehydration and AKI.  CRP is borderline but her D-dimer is extremely elevated.  She was treated with IV fluids, low-dose steroids and Remdesivir.  This problem has clinically resolved she is symptom-free on room air.  Encouraged the patient to sit up in chair in the daytime use I-S and flutter valve for pulmonary toiletry and then prone in bed when at night.  Will advance activity and titrate down oxygen as possible.   SpO2: 100 %  Recent Labs  Lab  07/14/20 0701 07/14/20 1119 07/14/20 1119 07/15/20 0034 07/16/20 0205 07/17/20 0453 07/18/20 0335 07/19/20 0509  WBC 11.1*  --   --  10.1 19.8* 18.1*  --   --   HGB 13.9  --   --  11.3* 10.5* 10.2*  --   --   HCT  43.6  --   --  33.2* 32.1* 31.9*  --   --   PLT 533*  --   --  295 438* 426*  --   --   CRP  --  4.1*   < > 3.3* 2.5* 1.2* 1.1* 0.8  BNP  --   --   --  6.5 267.7* 247.5*  --   --   DDIMER  --  8.92*   < > 7.25* 3.96* 5.50* 3.65* 2.63*  PROCALCITON 0.18  --   --   --   --   --   --   --   AST  --   --   --  36 32 28  --   --   ALT  --   --   --  27 26 28   --   --   ALKPHOS  --   --   --  37* 44 42  --   --   BILITOT  --   --   --  0.5 0.2* 0.7  --   --   ALBUMIN  --   --   --  2.9* 2.7* 2.4*  --   --   LATICACIDVEN  --  1.4  --   --   --   --   --   --    < > = values in this interval not displayed.    2.  Dehydration with AKI.  Hydrate with IV fluids avoid nephrotoxins.  She has underlying CKD 3A with baseline creatinine around 1.5.  3.  History of CVA with left-sided hemiparesis.  Supportive care.  Continue aspirin statin for secondary prevention, PT OT as needed.  4.  High D-dimer with severe dehydration.  High risk for developing a blood clot, leg ultrasound & VQ negative for now continue on moderate dose Lovenox.  5.  Obesity with BMI 32.  Follow with PCP for weight loss.  6.  Possible UTI. Finished  3 days of Rocephin.  7. Hypomagnesemia. Replaced on 07/17/2020.  8. Acute gout affecting both feet.  X-ray unremarkable, uric acid level elevated, placed on colchicine and steroids ( daily dose) , improving, monitor.      Condition -   Guarded  Family Communication  : Niece Ernestine Mcmurray [phone] on 07/15/2020 - wrong number  Code Status :  Full  Consults  :  PCCM  Procedures  :    Leg [name]  -ve  VQ Scan  -ve  PUD Prophylaxis : PPI  Disposition Plan  :    Status is: Inpatient  Remains inpatient appropriate because:IV treatments appropriate due to  intensity of illness or inability to take PO   Dispo: The patient is from: Home              Anticipated d/c is to: Home              Anticipated d/c date is: > 3 days              Patient currently is not medically stable to d/c.   Difficult to place patient No   DVT Prophylaxis  :  Hep gtt >> Lovenox  Lab Results  Component Value Date   PLT 426 (H) 07/17/2020    Diet :  Diet Order            Diet regular Room service appropriate? Yes; Fluid consistency: Thin  Diet effective now  Inpatient Medications  Scheduled Meds: . amLODipine  10 mg Oral Daily  . vitamin C  500 mg Oral Daily  . aspirin EC  81 mg Oral Daily  . atorvastatin  40 mg Oral QHS  . brimonidine  1 drop Both Eyes BID  . colchicine  0.6 mg Oral Daily  . dorzolamide-timolol  1 drop Both Eyes BID  . enoxaparin (LOVENOX) injection  40 mg Subcutaneous Q12H  . feeding supplement  237 mL Oral BID BM  . gabapentin  300 mg Oral BID  . hydrocortisone cream   Topical BID  . latanoprost  1 drop Both Eyes QHS  . loratadine  10 mg Oral Daily  . multivitamin with minerals  1 tablet Oral Daily  . pantoprazole  40 mg Oral Daily  . pneumococcal 23 valent vaccine  0.5 mL Intramuscular Tomorrow-1000  . zinc sulfate  220 mg Oral Daily   Continuous Infusions:  PRN Meds:.acetaminophen **OR** [DISCONTINUED] acetaminophen, albuterol, calcium carbonate (dosed in mg elemental calcium), docusate sodium, feeding supplement (NEPRO CARB STEADY), guaiFENesin-dextromethorphan, hydrALAZINE, [DISCONTINUED] ondansetron **OR** ondansetron (ZOFRAN) IV, oxyCODONE, polyethylene glycol, sorbitol, zolpidem  Antibiotics  :    Anti-infectives (From admission, onward)   Start     Dose/Rate Route Frequency Ordered Stop   07/15/20 1215  cefTRIAXone (ROCEPHIN) 1 g in sodium chloride 0.9 % 100 mL IVPB        1 g 200 mL/hr over 30 Minutes Intravenous Every 24 hours 07/15/20 1116 07/17/20 2005   07/15/20 1000  remdesivir 100 mg  in sodium chloride 0.9 % 100 mL IVPB  Status:  Discontinued       "Followed by" Linked Group Details   100 mg 200 mL/hr over 30 Minutes Intravenous Daily 07/14/20 1449 07/14/20 1451   07/15/20 1000  remdesivir 100 mg in sodium chloride 0.9 % 100 mL IVPB       "Followed by" Linked Group Details   100 mg 200 mL/hr over 30 Minutes Intravenous Daily 07/14/20 1451 07/18/20 0942   07/14/20 1600  remdesivir 200 mg in sodium chloride 0.9% 250 mL IVPB       "Followed by" Linked Group Details   200 mg 580 mL/hr over 30 Minutes Intravenous Once 07/14/20 1451 07/14/20 1900   07/14/20 1500  remdesivir 200 mg in sodium chloride 0.9% 250 mL IVPB  Status:  Discontinued       "Followed by" Linked Group Details   200 mg 580 mL/hr over 30 Minutes Intravenous Once 07/14/20 1449 07/14/20 1451       Time Spent in minutes  30   Susa Raring M.D on 07/20/2020 at 9:16 AM  To page go to [email or web address]   [facility] -  Office  [phone]  See all Orders from today for further details    Objective:   Vitals:   07/19/20 2113 07/20/20 0008 07/20/20 0420 07/20/20 0719  BP: (!) 141/89 128/87 (!) 127/93 (!) 140/96  Pulse: (!) 103 90 98 100  Resp: 16 20 18 16   Temp: 98.2 F (36.8 C) 98.4 F (36.9 C) 98.5 F (36.9 C) 98 F (36.7 C)  TempSrc: Axillary Oral Oral Axillary  SpO2: 100% 100% 100% 100%  Weight:      Height:        Wt Readings from Last 3 Encounters:  07/14/20 79.4 kg    No intake or output data in the 24 hours ending 07/20/20 0916   Physical Exam  Awake Alert,   [place].AT,PERRAL Supple Neck,No JVD, No cervical  lymphadenopathy appriciated.  Symmetrical Chest wall movement, Good air movement bilaterally, CTAB RRR,No Gallops, Rubs or new Murmurs, No Parasternal Heave +ve B.Sounds, Abd Soft, No tenderness, No organomegaly appriciated, No rebound - guarding or rigidity.  chronic left-sided weakness from previous stroke, bilateral diffusely tender feet left more than right     Data Review:    CBC Recent Labs  Lab 07/14/20 0701 07/15/20 0034 07/16/20 0205 07/17/20 0453  WBC 11.1* 10.1 19.8* 18.1*  HGB 13.9 11.3* 10.5* 10.2*  HCT 43.6 33.2* 32.1* 31.9*  PLT 533* 295 438* 426*  MCV 89.5 88.5 88.9 88.9  MCH 28.5 30.1 29.1 28.4  MCHC 31.9 34.0 32.7 32.0  RDW 13.0 13.1 13.0 13.1  LYMPHSABS  --  1.0 1.6 2.4  MONOABS  --  0.3 1.5* 1.1*  EOSABS  --  0.0 0.0 0.0  BASOSABS  --  0.0 0.0 0.0    Recent Labs  Lab 07/14/20 0701 07/14/20 1119 07/14/20 1119 07/15/20 0034 07/16/20 0205 07/17/20 0453 07/18/20 0335 07/19/20 0509  NA 136  --   --  139 140 140  --  140  K 3.8  --   --  4.3 4.1 3.8  --  4.3  CL 94*  --   --  105 106 106  --  104  CO2 26  --   --  21* 23 23  --  26  GLUCOSE 118*  --   --  164* 113* 133*  --  147*  BUN 60*  --   --  49* 44* 41*  --  28*  CREATININE 4.80*  --   --  2.64* 1.55* 1.47*  --  1.45*  CALCIUM 9.9  --   --  8.4* 9.2 8.8*  --  8.9  AST  --   --   --  36 32 28  --   --   ALT  --   --   --  27 26 28   --   --   ALKPHOS  --   --   --  37* 44 42  --   --   BILITOT  --   --   --  0.5 0.2* 0.7  --   --   ALBUMIN  --   --   --  2.9* 2.7* 2.4*  --   --   MG  --   --   --  1.8 1.7 1.6* 1.9 1.9  CRP  --  4.1*   < > 3.3* 2.5* 1.2* 1.1* 0.8  DDIMER  --  8.92*   < > 7.25* 3.96* 5.50* 3.65* 2.63*  PROCALCITON 0.18  --   --   --   --   --   --   --   LATICACIDVEN  --  1.4  --   --   --   --   --   --   BNP  --   --   --  6.5 267.7* 247.5*  --   --    < > = values in this interval not displayed.    ------------------------------------------------------------------------------------------------------------------ No results for input(s): CHOL, HDL, LDLCALC, TRIG, CHOLHDL, LDLDIRECT in the last 72 hours.  No results found for: HGBA1C ------------------------------------------------------------------------------------------------------------------ No results for input(s): TSH, T4TOTAL, T3FREE, THYROIDAB in the last 72  hours.  Invalid input(s): FREET3  Cardiac Enzymes No results for input(s): CKMB, TROPONINI, MYOGLOBIN in the last 168 hours.  Invalid input(s): CK ------------------------------------------------------------------------------------------------------------------    Component Value Date/Time  BNP 247.5 (H) 07/17/2020 0453    Micro Results Recent Results (from the past 240 hour(s))  Blood Culture (routine x 2)     Status: None   Collection Time: 07/14/20 11:19 AM   Specimen: BLOOD  Result Value Ref Range Status   Specimen Description BLOOD RIGHT ANTECUBITAL  Final   Special Requests   Final    BOTTLES DRAWN AEROBIC AND ANAEROBIC Blood Culture adequate volume   Culture   Final    NO GROWTH 5 DAYS Performed at [facility], [address] [place], [place] [address]    Report Status 07/19/2020 FINAL  Final  Blood Culture (routine x 2)     Status: None   Collection Time: 07/14/20  4:43 PM   Specimen: BLOOD RIGHT HAND  Result Value Ref Range Status   Specimen Description BLOOD RIGHT HAND  Final   Special Requests   Final    BOTTLES DRAWN AEROBIC ONLY Blood Culture results may not be optimal due to an inadequate volume of blood received in culture bottles   Culture   Final    NO GROWTH 5 DAYS Performed at [facility], [address] [place], [place] [address]    Report Status 07/19/2020 FINAL  Final  Culture, Urine     Status: None   Collection Time: 07/14/20  5:52 PM   Specimen: Urine, Random  Result Value Ref Range Status   Specimen Description URINE, RANDOM  Final   Special Requests NONE  Final   Culture   Final    NO GROWTH Performed at [facility], [address] [place], [place] [address]    Report Status 07/16/2020 FINAL  Final  MRSA PCR Screening     Status: None   Collection Time: 07/15/20  5:32 AM   Specimen: Nasal Mucosa; Nasopharyngeal  Result Value Ref Range Status   MRSA by PCR NEGATIVE NEGATIVE Final    Comment:        The  GeneXpert MRSA Assay (FDA approved for NASAL specimens only), is one component of a comprehensive MRSA colonization surveillance program. It is not intended to diagnose MRSA infection nor to guide or monitor treatment for MRSA infections. Performed at [facility], [address] [place], [place] [address]     Radiology Reports DG Chest 2 View  Result Date: 07/05/2020 CLINICAL DATA:  Shortness of breath and fatigue EXAM: CHEST - 2 VIEW COMPARISON:  None. FINDINGS: Indistinct densities at the lung bases on the frontal view. No edema, effusion, or pneumothorax. Normal heart size and mediastinal contours. IMPRESSION: Mild infiltrate or atelectasis at the lung bases, lateral view and low volumes favoring atelectasis. Electronically Signed   By: Marnee SpringJonathon  Watts M.D.   On: 07/05/2020 10:26   CT Head Wo Contrast  Result Date: 07/05/2020 CLINICAL DATA:  Worsening left-sided weakness (history of CVA) also with history of nontraumatic ICH. EXAM: CT HEAD WITHOUT CONTRAST TECHNIQUE: Contiguous axial images were obtained from the base of the skull through the vertex without intravenous contrast. COMPARISON:  No pertinent prior exams available for comparison. FINDINGS: Brain: Cerebral volume is normal for age. There is a subcentimeter hypodensity within the right thalamocapsular junction with subtle peripheral curvilinear hyperdensity (for instance as seen on series 3, images 15-17). There are chronic lacunar infarcts within the left corona radiata and left pons. No demarcated cortical infarct. No extra-axial fluid collection. No evidence of intracranial mass. No midline shift. Vascular: No hyperdense vessel.  Atherosclerotic calcifications  Skull: Normal. Negative for fracture or focal lesion. Sinuses/Orbits: Visualized orbits show no acute finding. No significant paranasal sinus disease at the imaged levels. These results were called by telephone at the time of interpretation on 07/05/2020 at 11:42 am  to provider [facility] , who verbally acknowledged these results. IMPRESSION: Subcentimeter hypodensity within the right thalamocapsular junction with subtle peripheral curvilinear hyperdensity. This is favored to reflect subacute/chronic blood products at site of a subacute/chronic lacunar infarct. However, correlation with the patient's history and any available prior outside imaging is necessary. If this does not correlate with the patient's history and outside imaging, a contrast-enhanced brain MRI is recommended for further evaluation. Chronic lacunar infarcts within the left corona radiata and left basal ganglia. Electronically Signed   By: Jackey Loge DO   On: 07/05/2020 11:43   MR BRAIN WO CONTRAST  Result Date: 07/05/2020 CLINICAL DATA:  Neuro deficit, acute, stroke suspected History of thalamic ICH EXAM: MRI HEAD WITHOUT CONTRAST TECHNIQUE: Multiplanar, multiecho pulse sequences of the brain and surrounding structures were obtained without intravenous contrast. COMPARISON:  07/05/2020. FINDINGS: Brain: No focal restricted diffusion. Remote microhemorrhages involving the left thalamus, cerebellum and left basal ganglia. Sequela of remote insult involving the right thalamus with associated hemosiderin deposition. No midline shift, ventriculomegaly or extra-axial fluid collection. No mass lesion. Chronic left corona radiata and pontine lacunar insults. Minimal chronic microvascular ischemic changes. Vascular: Normal flow voids. Skull and upper cervical spine: Normal marrow signal. Sinuses/Orbits: Normal orbits. Pneumatized paranasal sinuses. Bilateral mastoid free fluid. Other: None. IMPRESSION: Sequela of prior right thalamic hemorrhage. No acute intracranial hemorrhage or infarct. Remote insults involving the left corona radiata and pons. Sequela of hypertensive microangiopathy. Electronically Signed   By: Stana Bunting M.D.   On: 07/05/2020 16:14   NM Pulmonary Perfusion  Result Date:  07/15/2020 CLINICAL DATA:  Elevated D-dimer, high risk of pulmonary embolism due to recent COVID infection EXAM: NUCLEAR MEDICINE PERFUSION LUNG SCAN TECHNIQUE: Perfusion images were obtained in multiple projections after intravenous injection of radiopharmaceutical. Ventilation scans intentionally deferred if perfusion scan and chest x-ray adequate for interpretation during COVID 19 epidemic. RADIOPHARMACEUTICALS:  4.3 mCi Tc-57m MAA IV COMPARISON:  Chest radiograph 07/15/2020 FINDINGS: Normal perfusion lung scan. No perfusion defects identified. IMPRESSION: Normal perfusion lung scan. Electronically Signed   By: Ulyses Southward M.D.   On: 07/15/2020 15:54   US RENAL  Result Date: 07/14/2020 CLINICAL DATA:  Acute renal injury EXAM: RENAL / URINARY TRACT ULTRASOUND COMPLETE COMPARISON:  None. FINDINGS: Right Kidney: Renal measurements: 9.4 x 4.0 x 4.3 cm. = volume: 83 mL. Echogenicity within normal limits. No mass or hydronephrosis visualized. Left Kidney: Renal measurements: 10.8 x 5.2 x 4.2 cm. = volume: 124 mL. Echogenicity within normal limits. No mass or hydronephrosis visualized. Bladder: Appears normal for degree of bladder distention. Other: None. IMPRESSION: No acute abnormality noted. Electronically Signed   By: Alcide Clever M.D.   On: 07/14/2020 21:15   DG Chest Port 1 View  Result Date: 07/15/2020 CLINICAL DATA:  Shortness of breath EXAM: PORTABLE CHEST 1 VIEW COMPARISON:  07/14/2020 FINDINGS: Numerous leads and wires project over the chest. Midline trachea. Normal heart size. Atherosclerosis in the transverse aorta. No pleural effusion or pneumothorax. Clear lungs. IMPRESSION: No acute cardiopulmonary disease. Aortic Atherosclerosis (ICD10-I70.0). Electronically Signed   By: Jeronimo Greaves M.D.   On: 07/15/2020 09:08   DG Chest Portable 1 View  Result Date: 07/14/2020 CLINICAL DATA:  Dyspnea, COVID pneumonia EXAM: PORTABLE CHEST 1 VIEW COMPARISON:  07/05/2020 FINDINGS: The heart size and mediastinal  contours are within normal limits. Both lungs are clear. The visualized skeletal structures are unremarkable. IMPRESSION: No active disease. Electronically Signed   By: Helyn Numbers MD   On: 07/14/2020 10:43   DG Foot 2 Views Left  Result Date: 07/18/2020 CLINICAL DATA:  Left foot pain without known injury. EXAM: LEFT FOOT - 2 VIEW COMPARISON:  None. FINDINGS: There is no evidence of fracture or dislocation. There is no evidence of arthropathy or other focal bone abnormality. Soft tissues are unremarkable. IMPRESSION: Negative. Electronically Signed   By: Lupita Raider M.D.   On: 07/18/2020 10:11   VAS [name] LOWER EXTREMITY VENOUS (DVT)  Result Date: 07/15/2020  Lower Venous DVT Study Indications: Edema.  Comparison Study: no prior Performing Technologist: Blanch Media RVS  Examination Guidelines: A complete evaluation includes B-mode imaging, spectral Doppler, color Doppler, and power Doppler as needed of all accessible portions of each vessel. Bilateral testing is considered an integral part of a complete examination. Limited examinations for reoccurring indications may be performed as noted. The reflux portion of the exam is performed with the patient in reverse Trendelenburg.  +---------+---------------+---------+-----------+----------+--------------+ RIGHT    CompressibilityPhasicitySpontaneityPropertiesThrombus Aging +---------+---------------+---------+-----------+----------+--------------+ CFV      Full           Yes      Yes                                 +---------+---------------+---------+-----------+----------+--------------+ SFJ      Full                                                        +---------+---------------+---------+-----------+----------+--------------+ FV Prox  Full                                                        +---------+---------------+---------+-----------+----------+--------------+ FV Mid   Full                                                         +---------+---------------+---------+-----------+----------+--------------+ FV DistalFull                                                        +---------+---------------+---------+-----------+----------+--------------+ PFV      Full                                                        +---------+---------------+---------+-----------+----------+--------------+ POP      Full           Yes      Yes                                 +---------+---------------+---------+-----------+----------+--------------+  PTV      Full                                                        +---------+---------------+---------+-----------+----------+--------------+ PERO     Full                                                        +---------+---------------+---------+-----------+----------+--------------+   +---------+---------------+---------+-----------+----------+--------------+ LEFT     CompressibilityPhasicitySpontaneityPropertiesThrombus Aging +---------+---------------+---------+-----------+----------+--------------+ CFV      Full           Yes      Yes                                 +---------+---------------+---------+-----------+----------+--------------+ SFJ      Full                                                        +---------+---------------+---------+-----------+----------+--------------+ FV Prox  Full                                                        +---------+---------------+---------+-----------+----------+--------------+ FV Mid   Full                                                        +---------+---------------+---------+-----------+----------+--------------+ FV DistalFull                                                        +---------+---------------+---------+-----------+----------+--------------+ PFV      Full                                                         +---------+---------------+---------+-----------+----------+--------------+ POP      Full           Yes      Yes                                 +---------+---------------+---------+-----------+----------+--------------+ PTV      Full                                                        +---------+---------------+---------+-----------+----------+--------------+  PERO     Full                                                        +---------+---------------+---------+-----------+----------+--------------+     Summary: BILATERAL: - No evidence of deep vein thrombosis seen in the lower extremities, bilaterally. - No evidence of superficial venous thrombosis in the lower extremities, bilaterally. -No evidence of popliteal cyst, bilaterally.   *See table(s) above for measurements and observations. Electronically signed by Heath Lark on 07/15/2020 at 3:49:54 PM.    Final

## 2020-07-21 DIAGNOSIS — M79672 Pain in left foot: Secondary | ICD-10-CM | POA: Diagnosis not present

## 2020-07-21 DIAGNOSIS — N179 Acute kidney failure, unspecified: Secondary | ICD-10-CM | POA: Diagnosis not present

## 2020-07-21 DIAGNOSIS — U071 COVID-19: Secondary | ICD-10-CM | POA: Diagnosis not present

## 2020-07-21 DIAGNOSIS — M79671 Pain in right foot: Secondary | ICD-10-CM

## 2020-07-21 LAB — CBC WITH DIFFERENTIAL/PLATELET
Abs Immature Granulocytes: 0 10*3/uL (ref 0.00–0.07)
Basophils Absolute: 0 10*3/uL (ref 0.0–0.1)
Basophils Relative: 0 %
Eosinophils Absolute: 0 10*3/uL (ref 0.0–0.5)
Eosinophils Relative: 0 %
HCT: 33.1 % — ABNORMAL LOW (ref 36.0–46.0)
Hemoglobin: 11.4 g/dL — ABNORMAL LOW (ref 12.0–15.0)
Lymphocytes Relative: 12 %
Lymphs Abs: 3.7 10*3/uL (ref 0.7–4.0)
MCH: 30.5 pg (ref 26.0–34.0)
MCHC: 34.4 g/dL (ref 30.0–36.0)
MCV: 88.5 fL (ref 80.0–100.0)
Monocytes Absolute: 2.8 10*3/uL — ABNORMAL HIGH (ref 0.1–1.0)
Monocytes Relative: 9 %
Neutro Abs: 24.6 10*3/uL — ABNORMAL HIGH (ref 1.7–7.7)
Neutrophils Relative %: 79 %
Platelets: 436 10*3/uL — ABNORMAL HIGH (ref 150–400)
RBC: 3.74 MIL/uL — ABNORMAL LOW (ref 3.87–5.11)
RDW: 13.7 % (ref 11.5–15.5)
WBC: 31.2 10*3/uL — ABNORMAL HIGH (ref 4.0–10.5)
nRBC: 0 /100 WBC
nRBC: 0.1 % (ref 0.0–0.2)

## 2020-07-21 LAB — COMPREHENSIVE METABOLIC PANEL
ALT: 64 U/L — ABNORMAL HIGH (ref 0–44)
AST: 42 U/L — ABNORMAL HIGH (ref 15–41)
Albumin: 2.6 g/dL — ABNORMAL LOW (ref 3.5–5.0)
Alkaline Phosphatase: 46 U/L (ref 38–126)
Anion gap: 10 (ref 5–15)
BUN: 32 mg/dL — ABNORMAL HIGH (ref 6–20)
CO2: 28 mmol/L (ref 22–32)
Calcium: 8.8 mg/dL — ABNORMAL LOW (ref 8.9–10.3)
Chloride: 99 mmol/L (ref 98–111)
Creatinine, Ser: 1 mg/dL (ref 0.44–1.00)
GFR, Estimated: >60 mL/min (ref >60)
Glucose, Bld: 126 mg/dL — ABNORMAL HIGH (ref 70–99)
Potassium: 4.6 mmol/L (ref 3.5–5.1)
Sodium: 137 mmol/L (ref 135–145)
Total Bilirubin: 0.5 mg/dL (ref 0.3–1.2)
Total Protein: 5.7 g/dL — ABNORMAL LOW (ref 6.5–8.1)

## 2020-07-21 LAB — GLUCOSE, CAPILLARY
Glucose-Capillary: 92 mg/dL (ref 70–99)
Glucose-Capillary: 120 mg/dL — ABNORMAL HIGH (ref 70–99)

## 2020-07-21 LAB — MAGNESIUM: Magnesium: 1.9 mg/dL (ref 1.7–2.4)

## 2020-07-21 LAB — C-REACTIVE PROTEIN: CRP: 0.7 mg/dL (ref <1.0)

## 2020-07-21 LAB — D-DIMER, QUANTITATIVE: D-Dimer, Quant: 1.66 ug/mL-FEU — ABNORMAL HIGH (ref 0.00–0.50)

## 2020-07-21 LAB — BRAIN NATRIURETIC PEPTIDE: B Natriuretic Peptide: 69.8 pg/mL (ref 0.0–100.0)

## 2020-07-21 LAB — PROCALCITONIN: Procalcitonin: <0.1 ng/mL

## 2020-07-21 MED ORDER — METHYLPREDNISOLONE SODIUM SUCC 125 MG IJ SOLR
80.0000 mg | Freq: Once | INTRAMUSCULAR | Status: AC
  Administered 2020-07-21: 80 mg via INTRAVENOUS
  Filled 2020-07-21: qty 2

## 2020-07-21 MED ORDER — INDOMETHACIN 25 MG PO CAPS
50.0000 mg | ORAL_CAPSULE | Freq: Once | ORAL | Status: AC
  Administered 2020-07-21: 50 mg via ORAL
  Filled 2020-07-21: qty 2

## 2020-07-21 NOTE — Progress Notes (Signed)
Occupational Therapy Treatment Patient Details Name: Shirley Hansen MRN: [id number] DOB: 1965-07-20 Today's Date: 07/21/2020    History of present illness Pt is a 55 y.o. female who tested (+) COVID-19 on 07/05/20, now admitted 07/14/20 with generalized weakness; workup for dehydration, AKI, acute COVID-19 infection. PMH includes CVA (residual L-side deficits), HTN, CKD 3.   OT comments  Pt with improved AROM and functional use of Lt UE today.  Attempted transfer to [facility], but could not complete with +1 max A - ? If foot pain contributing.  She required max verbal and tactile cuing for sequencing and problem solving.  She performed bed mobility with min - mod A.  VSS.  Recommend SNF.   Follow Up Recommendations  SNF    Equipment Recommendations  None recommended by OT    Recommendations for Other Services      Precautions / Restrictions Precautions Precautions: Fall;Other (comment) Precaution Comments: Residual L-side weakness; chronic foot pain (pt attributes to gout and stroke)       Mobility Bed Mobility Overal bed mobility: Needs Assistance Bed Mobility: Supine to Sit;Sit to Supine     Supine to sit: Mod assist Sit to supine: Mod assist   General bed mobility comments: step by step cues for sequencing and problem solving.  She requires assist to initiate movement of LEs off EOB and assist to lift trunk  Transfers Overall transfer level: Needs assistance   Transfers: Sit to/from Stand Sit to Stand: Total assist         General transfer comment: attempted to move sit to stand with poor ability to do so even with max A    Balance Overall balance assessment: Needs assistance Sitting-balance support: Feet supported Sitting balance-Leahy Scale: Fair Sitting balance - Comments: able to maintain static sitting with min guard assist       Standing balance comment: unable to achieve standing                           ADL either performed or assessed with clinical  judgement   ADL Overall ADL's : Needs assistance/impaired                         Toilet Transfer: Total assistance;Stand-pivot;BSC Toilet Transfer Details (indicate cue type and reason): Attempted to transfer for [facility], but pt unable to lift hips despite total A - unsure if pain contributing Toileting- Clothing Manipulation and Hygiene: Total assistance;Bed level       Functional mobility during ADLs: Maximal assistance;Total assistance       Vision       Perception     Praxis      Cognition Arousal/Alertness: Awake/alert Behavior During Therapy: WFL for tasks assessed/performed Overall Cognitive Status: No family/caregiver present to determine baseline cognitive functioning Area of Impairment: Attention;Following commands;Safety/judgement;Awareness;Problem solving                   Current Attention Level: Selective   Following Commands: Follows one step commands consistently;Follows multi-step commands inconsistently Safety/Judgement: Decreased awareness of safety   Problem Solving: Slow processing;Decreased initiation;Difficulty sequencing;Requires verbal cues;Requires tactile cues General Comments: Pt frequently self distracts.  SHe is internally distracted by pain and current situation  as well as need to have a BM.  She required max verbal and tactile cues to problem solve and sequence through toilet transfer to [facility]        Exercises     Shoulder  Instructions       General Comments VSS    Pertinent Vitals/ Pain       Pain Assessment: Faces Faces Pain Scale: Hurts even more Pain Location: Bilateral feet (R-side "gout" and L-side "burning because of my stroke") Pain Descriptors / Indicators: Aching;Discomfort;Burning Pain Intervention(s): Limited activity within patient's tolerance  Home Living                                          Prior Functioning/Environment              Frequency  Min 2X/week         Progress Toward Goals  OT Goals(current goals can now be found in the care plan section)  Progress towards OT goals: Progressing toward goals     Plan Discharge plan remains appropriate    Co-evaluation                 AM-PAC OT "6 Clicks" Daily Activity     Outcome Measure   Help from another person eating meals?: None Help from another person taking care of personal grooming?: A Little Help from another person toileting, which includes using toliet, bedpan, or urinal?: A Lot Help from another person bathing (including washing, rinsing, drying)?: A Lot Help from another person to put on and taking off regular upper body clothing?: A Little Help from another person to put on and taking off regular lower body clothing?: Total 6 Click Score: 15    End of Session    OT Visit Diagnosis: Unsteadiness on feet (R26.81);Hemiplegia and hemiparesis;Pain Hemiplegia - Right/Left: Left Hemiplegia - dominant/non-dominant: Non-Dominant Hemiplegia - caused by: Cerebral infarction Pain - Right/Left: Left Pain - part of body: Leg   Activity Tolerance Patient limited by pain   Patient Left in bed;with call bell/phone within reach;with bed alarm set   Nurse Communication Mobility status        Time: 4166-0630 OT Time Calculation (min): 53 min  Charges: OT General Charges $OT Visit: 1 Visit OT Treatments $Self Care/Home Management : 23-37 mins $Neuromuscular Re-education: 23-37 mins  Eber Jones., OTR/L Acute Rehabilitation Services Pager [phone] Office [phone]    Jeani Hawking M 07/21/2020, 5:06 PM

## 2020-07-21 NOTE — Progress Notes (Signed)
PROGRESS NOTE                                                                                                                                                                                                             Patient Demographics:    Shirley Hansen, is a 55 y.o. female, DOB - 1965-10-11, PJK:[id number]  Outpatient Primary MD for the patient is Pcp, No    LOS - 7  Admit date - 07/14/2020    Chief Complaint  Patient presents with  . Back Pain  . Weakness    Covid        Brief Narrative (HPI from H&P)  - Shirley Hansen is a 55 y.o. female with medical history significant of CVA with left-sided deficits; HTN; and stage 3a CKD presenting with worsening COVID symptoms, positive on 1/24.  She was seen in the ER on 1/24 for progressive and generalized weakness, she had no shortness of breath her main symptom was generalized weakness, in the ER her work-up was consistent with severe dehydration she was found to have extremely high D-dimer, AKI, hypotension and she was admitted   Subjective:   Patient in bed, appears comfortable,, chills, no chest pain or shortness of breath, still complaining of right toe pain, and left foot pain mainly upon standing.     Assessment  & Plan :   1. Acute COVID-19 infection - she has received 1 shot of Pfizer vaccine recently, she has minimal pulmonary involvement and symptoms are more generalized with severe dehydration and AKI.  CRP is borderline but her D-dimer is extremely elevated.  She was treated with IV fluids, low-dose steroids and Remdesivir.  This problem has clinically resolved she is symptom-free on room air.  Encouraged the patient to sit up in chair in the daytime use I-S and flutter valve for pulmonary toiletry and then prone in bed when at night.  Will advance activity and titrate down oxygen as possible. -Leukocytosis in the setting of steroids, procalcitonin is reassuring.   SpO2:  100 %  Recent Labs  Lab 07/15/20 0034 07/16/20 0205 07/17/20 0453 07/18/20 0335 07/19/20 0509 07/21/20 0105  WBC 10.1 19.8* 18.1*  --   --  31.2*  HGB 11.3* 10.5* 10.2*  --   --  11.4*  HCT 33.2* 32.1* 31.9*  --   --  33.1*  PLT 295 438* 426*  --   --  436*  CRP 3.3* 2.5* 1.2* 1.1* 0.8 0.7  BNP 6.5 267.7* 247.5*  --   --  69.8  DDIMER 7.25* 3.96* 5.50* 3.65* 2.63* 1.66*  PROCALCITON  --   --   --   --   --  <0.10  AST 36 32 28  --   --  42*  ALT 27 26 28   --   --  64*  ALKPHOS 37* 44 42  --   --  46  BILITOT 0.5 0.2* 0.7  --   --  0.5  ALBUMIN 2.9* 2.7* 2.4*  --   --  2.6*    2.  Dehydration with AKI.  Hydrate with IV fluids avoid nephrotoxins.  She has underlying CKD 3A with baseline creatinine around 1.5.  3.  History of CVA with left-sided hemiparesis.  Supportive care.  Continue aspirin statin for secondary prevention, PT OT as needed.  4.  High D-dimer with severe dehydration.  High risk for developing a blood clot, leg ultrasound & VQ negative for now continue on moderate dose Lovenox.  5.  Obesity with BMI 32.  Follow with PCP for weight loss.  6.  Possible UTI. Finished  3 days of Rocephin.  7. Hypomagnesemia. Replaced on 07/17/2020.  8. Acute gout affecting both feet.  X-ray unremarkable, uric acid level elevated, she remains with significant pain this morning, so I will continue with colchicine, I will give 80 mg of IV Solu-Medrol, and will give 1 dose of indomethacin.  .       Condition -   Guarded  Family Communication  : Patient is coherent, I have discussed the case in details with her, answered all her questions, thank you.  Code Status :  Full  Consults  :  PCCM  Procedures  :    Leg 09/14/2020  -ve  VQ Scan  -ve  PUD Prophylaxis : PPI  Disposition Plan  :    Status is: Inpatient  Remains inpatient appropriate because:IV treatments appropriate due to intensity of illness or inability to take PO   Dispo: The patient is from: Home               Anticipated d/c is to: SNF              Anticipated d/c date is: 2 days              Patient currently is not medically stable to d/c.   Difficult to place patient No   DVT Prophylaxis  :  Hep gtt >> Lovenox  Lab Results  Component Value Date   PLT 436 (H) 07/21/2020    Diet :  Diet Order            Diet regular Room service appropriate? Yes; Fluid consistency: Thin  Diet effective now                  Inpatient Medications  Scheduled Meds: . amLODipine  10 mg Oral Daily  . vitamin C  500 mg Oral Daily  . aspirin EC  81 mg Oral Daily  . atorvastatin  40 mg Oral QHS  . brimonidine  1 drop Both Eyes BID  . colchicine  0.6 mg Oral Daily  . dorzolamide-timolol  1 drop Both Eyes BID  . enoxaparin (LOVENOX) injection  40 mg Subcutaneous Q12H  . feeding supplement  237 mL Oral BID BM  . gabapentin  300  mg Oral BID  . hydrocortisone cream   Topical BID  . latanoprost  1 drop Both Eyes QHS  . loratadine  10 mg Oral Daily  . methylPREDNISolone (SOLU-MEDROL) injection  80 mg Intravenous Once  . multivitamin with minerals  1 tablet Oral Daily  . pantoprazole  40 mg Oral Daily  . pneumococcal 23 valent vaccine  0.5 mL Intramuscular Tomorrow-1000  . zinc sulfate  220 mg Oral Daily   Continuous Infusions:  PRN Meds:.acetaminophen **OR** [DISCONTINUED] acetaminophen, albuterol, calcium carbonate (dosed in mg elemental calcium), docusate sodium, feeding supplement (NEPRO CARB STEADY), guaiFENesin-dextromethorphan, hydrALAZINE, [DISCONTINUED] ondansetron **OR** ondansetron (ZOFRAN) IV, polyethylene glycol, sorbitol, zolpidem  Antibiotics  :    Anti-infectives (From admission, onward)   Start     Dose/Rate Route Frequency Ordered Stop   07/15/20 1215  cefTRIAXone (ROCEPHIN) 1 g in sodium chloride 0.9 % 100 mL IVPB        1 g 200 mL/hr over 30 Minutes Intravenous Every 24 hours 07/15/20 1116 07/17/20 2005   07/15/20 1000  remdesivir 100 mg in sodium chloride 0.9 % 100 mL IVPB   Status:  Discontinued       "Followed by" Linked Group Details   100 mg 200 mL/hr over 30 Minutes Intravenous Daily 07/14/20 1449 07/14/20 1451   07/15/20 1000  remdesivir 100 mg in sodium chloride 0.9 % 100 mL IVPB       "Followed by" Linked Group Details   100 mg 200 mL/hr over 30 Minutes Intravenous Daily 07/14/20 1451 07/18/20 0942   07/14/20 1600  remdesivir 200 mg in sodium chloride 0.9% 250 mL IVPB       "Followed by" Linked Group Details   200 mg 580 mL/hr over 30 Minutes Intravenous Once 07/14/20 1451 07/14/20 1900   07/14/20 1500  remdesivir 200 mg in sodium chloride 0.9% 250 mL IVPB  Status:  Discontinued       "Followed by" Linked Group Details   200 mg 580 mL/hr over 30 Minutes Intravenous Once 07/14/20 1449 07/14/20 1451       Belmont Valli M.D on 07/21/2020 at 2:56 PM  To page go to [email or web address]   [facility] -  Office  [phone]  See all Orders from today for further details    Objective:   Vitals:   07/20/20 2053 07/21/20 0333 07/21/20 0750 07/21/20 1154  BP: (!) 147/94 (!) 147/91 (!) 136/98 124/87  Pulse:  91 100 100  Resp:  16 14 15   Temp: 98.1 F (36.7 C) 98 F (36.7 C) 98.6 F (37 C) 98.7 F (37.1 C)  TempSrc: Oral Oral Oral Axillary  SpO2:  100% 100% 100%  Weight:      Height:        Wt Readings from Last 3 Encounters:  07/14/20 79.4 kg     Intake/Output Summary (Last 24 hours) at 07/21/2020 1456 Last data filed at 07/21/2020 1438 Gross per 24 hour  Intake 540 ml  Output 1600 ml  Net -1060 ml     Physical Exam  Awake Alert, Oriented X 3, No new F.N deficits, Normal affect Symmetrical Chest wall movement, Good air movement bilaterally, CTAB RRR,No Gallops,Rubs or new Murmurs, No Parasternal Heave +ve B.Sounds, Abd Soft, No tenderness, No rebound - guarding or rigidity. Chronic left-sided weakness from previous stroke, right toe tender to palpation swelling has subsided, left foot with swelling, warmth and tenderness to  palpation .    Data Review:    CBC Recent Labs  Lab  07/15/20 0034 07/16/20 0205 07/17/20 0453 07/21/20 0105  WBC 10.1 19.8* 18.1* 31.2*  HGB 11.3* 10.5* 10.2* 11.4*  HCT 33.2* 32.1* 31.9* 33.1*  PLT 295 438* 426* 436*  MCV 88.5 88.9 88.9 88.5  MCH 30.1 29.1 28.4 30.5  MCHC 34.0 32.7 32.0 34.4  RDW 13.1 13.0 13.1 13.7  LYMPHSABS 1.0 1.6 2.4 3.7  MONOABS 0.3 1.5* 1.1* 2.8*  EOSABS 0.0 0.0 0.0 0.0  BASOSABS 0.0 0.0 0.0 0.0    Recent Labs  Lab 07/15/20 0034 07/16/20 0205 07/17/20 0453 07/18/20 0335 07/19/20 0509 07/21/20 0105  NA 139 140 140  --  140 137  K 4.3 4.1 3.8  --  4.3 4.6  CL 105 106 106  --  104 99  CO2 21* 23 23  --  26 28  GLUCOSE 164* 113* 133*  --  147* 126*  BUN 49* 44* 41*  --  28* 32*  CREATININE 2.64* 1.55* 1.47*  --  1.45* 1.00  CALCIUM 8.4* 9.2 8.8*  --  8.9 8.8*  AST 36 32 28  --   --  42*  ALT 27 26 28   --   --  64*  ALKPHOS 37* 44 42  --   --  46  BILITOT 0.5 0.2* 0.7  --   --  0.5  ALBUMIN 2.9* 2.7* 2.4*  --   --  2.6*  MG 1.8 1.7 1.6* 1.9 1.9 1.9  CRP 3.3* 2.5* 1.2* 1.1* 0.8 0.7  DDIMER 7.25* 3.96* 5.50* 3.65* 2.63* 1.66*  PROCALCITON  --   --   --   --   --  <0.10  BNP 6.5 267.7* 247.5*  --   --  69.8    ------------------------------------------------------------------------------------------------------------------ No results for input(s): CHOL, HDL, LDLCALC, TRIG, CHOLHDL, LDLDIRECT in the last 72 hours.  No results found for: HGBA1C ------------------------------------------------------------------------------------------------------------------ No results for input(s): TSH, T4TOTAL, T3FREE, THYROIDAB in the last 72 hours.  Invalid input(s): FREET3  Cardiac Enzymes No results for input(s): CKMB, TROPONINI, MYOGLOBIN in the last 168 hours.  Invalid input(s): CK ------------------------------------------------------------------------------------------------------------------    Component Value Date/Time   BNP 69.8  07/21/2020 0105    Micro Results Recent Results (from the past 240 hour(s))  Blood Culture (routine x 2)     Status: None   Collection Time: 07/14/20 11:19 AM   Specimen: BLOOD  Result Value Ref Range Status   Specimen Description BLOOD RIGHT ANTECUBITAL  Final   Special Requests   Final    BOTTLES DRAWN AEROBIC AND ANAEROBIC Blood Culture adequate volume   Culture   Final    NO GROWTH 5 DAYS Performed at [facility], [address], [place], [place] [address]    Report Status 07/19/2020 FINAL  Final  Blood Culture (routine x 2)     Status: None   Collection Time: 07/14/20  4:43 PM   Specimen: BLOOD RIGHT HAND  Result Value Ref Range Status   Specimen Description BLOOD RIGHT HAND  Final   Special Requests   Final    BOTTLES DRAWN AEROBIC ONLY Blood Culture results may not be optimal due to an inadequate volume of blood received in culture bottles   Culture   Final    NO GROWTH 5 DAYS Performed at [facility], [address], [place], [place] [address]    Report Status 07/19/2020 FINAL  Final  Culture, Urine     Status: None   Collection Time: 07/14/20  5:52 PM  Specimen: Urine, Random  Result Value Ref Range Status   Specimen Description URINE, RANDOM  Final   Special Requests NONE  Final   Culture   Final    NO GROWTH Performed at [facility], [address], [place], [place] [address]    Report Status 07/16/2020 FINAL  Final  MRSA PCR Screening     Status: None   Collection Time: 07/15/20  5:32 AM   Specimen: Nasal Mucosa; Nasopharyngeal  Result Value Ref Range Status   MRSA by PCR NEGATIVE NEGATIVE Final    Comment:        The GeneXpert MRSA Assay (FDA approved for NASAL specimens only), is one component of a comprehensive MRSA colonization surveillance program. It is not intended to diagnose MRSA infection nor to guide or monitor treatment for MRSA infections. Performed at [facility], [address] [place], [place] [address]      Radiology Reports DG Chest 2 View  Result Date: 07/05/2020 CLINICAL DATA:  Shortness of breath and fatigue EXAM: CHEST - 2 VIEW COMPARISON:  None. FINDINGS: Indistinct densities at the lung bases on the frontal view. No edema, effusion, or pneumothorax. Normal heart size and mediastinal contours. IMPRESSION: Mild infiltrate or atelectasis at the lung bases, lateral view and low volumes favoring atelectasis. Electronically Signed   By: Marnee Spring M.D.   On: 07/05/2020 10:26   CT Head Wo Contrast  Result Date: 07/05/2020 CLINICAL DATA:  Worsening left-sided weakness (history of CVA) also with history of nontraumatic ICH. EXAM: CT HEAD WITHOUT CONTRAST TECHNIQUE: Contiguous axial images were obtained from the base of the skull through the vertex without intravenous contrast. COMPARISON:  No pertinent prior exams available for comparison. FINDINGS: Brain: Cerebral volume is normal for age. There is a subcentimeter hypodensity within the right thalamocapsular junction with subtle peripheral curvilinear hyperdensity (for instance as seen on series 3, images 15-17). There are chronic lacunar infarcts within the left corona radiata and left pons. No demarcated cortical infarct. No extra-axial fluid collection. No evidence of intracranial mass. No midline shift. Vascular: No hyperdense vessel.  Atherosclerotic calcifications Skull: Normal. Negative for fracture or focal lesion. Sinuses/Orbits: Visualized orbits show no acute finding. No significant paranasal sinus disease at the imaged levels. These results were called by telephone at the time of interpretation on 07/05/2020 at 11:42 am to provider [facility] , who verbally acknowledged these results. IMPRESSION: Subcentimeter hypodensity within the right thalamocapsular junction with subtle peripheral curvilinear hyperdensity. This is favored to reflect subacute/chronic blood products at site of a subacute/chronic lacunar infarct. However, correlation  with the patient's history and any available prior outside imaging is necessary. If this does not correlate with the patient's history and outside imaging, a contrast-enhanced brain MRI is recommended for further evaluation. Chronic lacunar infarcts within the left corona radiata and left basal ganglia. Electronically Signed   By: Jackey Loge DO   On: 07/05/2020 11:43   MR BRAIN WO CONTRAST  Result Date: 07/05/2020 CLINICAL DATA:  Neuro deficit, acute, stroke suspected History of thalamic ICH EXAM: MRI HEAD WITHOUT CONTRAST TECHNIQUE: Multiplanar, multiecho pulse sequences of the brain and surrounding structures were obtained without intravenous contrast. COMPARISON:  07/05/2020. FINDINGS: Brain: No focal restricted diffusion. Remote microhemorrhages involving the left thalamus, cerebellum and left basal ganglia. Sequela of remote insult involving the right thalamus with associated hemosiderin deposition. No midline shift, ventriculomegaly or extra-axial fluid collection. No mass lesion. Chronic left corona radiata and pontine lacunar insults. Minimal chronic  microvascular ischemic changes. Vascular: Normal flow voids. Skull and upper cervical spine: Normal marrow signal. Sinuses/Orbits: Normal orbits. Pneumatized paranasal sinuses. Bilateral mastoid free fluid. Other: None. IMPRESSION: Sequela of prior right thalamic hemorrhage. No acute intracranial hemorrhage or infarct. Remote insults involving the left corona radiata and pons. Sequela of hypertensive microangiopathy. Electronically Signed   By: Stana Buntinghikanele  Emekauwa M.D.   On: 07/05/2020 16:14   NM Pulmonary Perfusion  Result Date: 07/15/2020 CLINICAL DATA:  Elevated D-dimer, high risk of pulmonary embolism due to recent COVID infection EXAM: NUCLEAR MEDICINE PERFUSION LUNG SCAN TECHNIQUE: Perfusion images were obtained in multiple projections after intravenous injection of radiopharmaceutical. Ventilation scans intentionally deferred if perfusion scan  and chest x-ray adequate for interpretation during COVID 19 epidemic. RADIOPHARMACEUTICALS:  4.3 mCi Tc-5168m MAA IV COMPARISON:  Chest radiograph 07/15/2020 FINDINGS: Normal perfusion lung scan. No perfusion defects identified. IMPRESSION: Normal perfusion lung scan. Electronically Signed   By: Ulyses SouthwardMark  Boles M.D.   On: 07/15/2020 15:54   US RENAL  Result Date: 07/14/2020 CLINICAL DATA:  Acute renal injury EXAM: RENAL / URINARY TRACT ULTRASOUND COMPLETE COMPARISON:  None. FINDINGS: Right Kidney: Renal measurements: 9.4 x 4.0 x 4.3 cm. = volume: 83 mL. Echogenicity within normal limits. No mass or hydronephrosis visualized. Left Kidney: Renal measurements: 10.8 x 5.2 x 4.2 cm. = volume: 124 mL. Echogenicity within normal limits. No mass or hydronephrosis visualized. Bladder: Appears normal for degree of bladder distention. Other: None. IMPRESSION: No acute abnormality noted. Electronically Signed   By: Alcide CleverMark  Lukens M.D.   On: 07/14/2020 21:15   DG Chest Port 1 View  Result Date: 07/15/2020 CLINICAL DATA:  Shortness of breath EXAM: PORTABLE CHEST 1 VIEW COMPARISON:  07/14/2020 FINDINGS: Numerous leads and wires project over the chest. Midline trachea. Normal heart size. Atherosclerosis in the transverse aorta. No pleural effusion or pneumothorax. Clear lungs. IMPRESSION: No acute cardiopulmonary disease. Aortic Atherosclerosis (ICD10-I70.0). Electronically Signed   By: Jeronimo GreavesKyle  Talbot M.D.   On: 07/15/2020 09:08   DG Chest Portable 1 View  Result Date: 07/14/2020 CLINICAL DATA:  Dyspnea, COVID pneumonia EXAM: PORTABLE CHEST 1 VIEW COMPARISON:  07/05/2020 FINDINGS: The heart size and mediastinal contours are within normal limits. Both lungs are clear. The visualized skeletal structures are unremarkable. IMPRESSION: No active disease. Electronically Signed   By: Helyn NumbersAshesh  Parikh MD   On: 07/14/2020 10:43   DG Foot 2 Views Left  Result Date: 07/18/2020 CLINICAL DATA:  Left foot pain without known injury. EXAM: LEFT  FOOT - 2 VIEW COMPARISON:  None. FINDINGS: There is no evidence of fracture or dislocation. There is no evidence of arthropathy or other focal bone abnormality. Soft tissues are unremarkable. IMPRESSION: Negative. Electronically Signed   By: Lupita RaiderJames  Green Jr M.D.   On: 07/18/2020 10:11   VAS US LOWER EXTREMITY VENOUS (DVT)  Result Date: 07/15/2020  Lower Venous DVT Study Indications: Edema.  Comparison Study: no prior Performing Technologist: Blanch MediaMegan Riddle RVS  Examination Guidelines: A complete evaluation includes B-mode imaging, spectral Doppler, color Doppler, and power Doppler as needed of all accessible portions of each vessel. Bilateral testing is considered an integral part of a complete examination. Limited examinations for reoccurring indications may be performed as noted. The reflux portion of the exam is performed with the patient in reverse Trendelenburg.  +---------+---------------+---------+-----------+----------+--------------+ RIGHT    CompressibilityPhasicitySpontaneityPropertiesThrombus Aging +---------+---------------+---------+-----------+----------+--------------+ CFV      Full           Yes      Yes                                 +---------+---------------+---------+-----------+----------+--------------+  SFJ      Full                                                        +---------+---------------+---------+-----------+----------+--------------+ FV Prox  Full                                                        +---------+---------------+---------+-----------+----------+--------------+ FV Mid   Full                                                        +---------+---------------+---------+-----------+----------+--------------+ FV DistalFull                                                        +---------+---------------+---------+-----------+----------+--------------+ PFV      Full                                                         +---------+---------------+---------+-----------+----------+--------------+ POP      Full           Yes      Yes                                 +---------+---------------+---------+-----------+----------+--------------+ PTV      Full                                                        +---------+---------------+---------+-----------+----------+--------------+ PERO     Full                                                        +---------+---------------+---------+-----------+----------+--------------+   +---------+---------------+---------+-----------+----------+--------------+ LEFT     CompressibilityPhasicitySpontaneityPropertiesThrombus Aging +---------+---------------+---------+-----------+----------+--------------+ CFV      Full           Yes      Yes                                 +---------+---------------+---------+-----------+----------+--------------+ SFJ      Full                                                        +---------+---------------+---------+-----------+----------+--------------+  FV Prox  Full                                                        +---------+---------------+---------+-----------+----------+--------------+ FV Mid   Full                                                        +---------+---------------+---------+-----------+----------+--------------+ FV DistalFull                                                        +---------+---------------+---------+-----------+----------+--------------+ PFV      Full                                                        +---------+---------------+---------+-----------+----------+--------------+ POP      Full           Yes      Yes                                 +---------+---------------+---------+-----------+----------+--------------+ PTV      Full                                                         +---------+---------------+---------+-----------+----------+--------------+ PERO     Full                                                        +---------+---------------+---------+-----------+----------+--------------+     Summary: BILATERAL: - No evidence of deep vein thrombosis seen in the lower extremities, bilaterally. - No evidence of superficial venous thrombosis in the lower extremities, bilaterally. -No evidence of popliteal cyst, bilaterally.   *See table(s) above for measurements and observations. Electronically signed by Heath Lark on 07/15/2020 at 3:49:54 PM.    Final

## 2020-07-21 NOTE — Plan of Care (Signed)
  Problem: Education: Goal: Knowledge of risk factors and measures for prevention of condition will improve Outcome: Progressing   Problem: Education: Goal: Knowledge of General Education information will improve Description: Including pain rating scale, medication(s)/side effects and non-pharmacologic comfort measures Outcome: Progressing   Problem: Health Behavior/Discharge Planning: Goal: Ability to manage health-related needs will improve Outcome: Progressing   Problem: Activity: Goal: Risk for activity intolerance will decrease Outcome: Progressing   Problem: Pain Managment: Goal: General experience of comfort will improve Outcome: Progressing   

## 2020-07-22 DIAGNOSIS — M79672 Pain in left foot: Secondary | ICD-10-CM | POA: Diagnosis not present

## 2020-07-22 DIAGNOSIS — U071 COVID-19: Secondary | ICD-10-CM | POA: Diagnosis not present

## 2020-07-22 DIAGNOSIS — M79671 Pain in right foot: Secondary | ICD-10-CM | POA: Diagnosis not present

## 2020-07-22 LAB — CBC WITH DIFFERENTIAL/PLATELET
Abs Immature Granulocytes: 1.93 10*3/uL — ABNORMAL HIGH (ref 0.00–0.07)
Basophils Absolute: 0.1 10*3/uL (ref 0.0–0.1)
Basophils Relative: 0 %
Eosinophils Absolute: 0 10*3/uL (ref 0.0–0.5)
Eosinophils Relative: 0 %
HCT: 35.2 % — ABNORMAL LOW (ref 36.0–46.0)
Hemoglobin: 11.9 g/dL — ABNORMAL LOW (ref 12.0–15.0)
Immature Granulocytes: 7 %
Lymphocytes Relative: 12 %
Lymphs Abs: 3.1 10*3/uL (ref 0.7–4.0)
MCH: 30.4 pg (ref 26.0–34.0)
MCHC: 33.8 g/dL (ref 30.0–36.0)
MCV: 89.8 fL (ref 80.0–100.0)
Monocytes Absolute: 1 10*3/uL (ref 0.1–1.0)
Monocytes Relative: 4 %
Neutro Abs: 20.4 10*3/uL — ABNORMAL HIGH (ref 1.7–7.7)
Neutrophils Relative %: 77 %
Platelets: 384 10*3/uL (ref 150–400)
RBC: 3.92 MIL/uL (ref 3.87–5.11)
RDW: 13.7 % (ref 11.5–15.5)
WBC: 26.5 10*3/uL — ABNORMAL HIGH (ref 4.0–10.5)
nRBC: 0.1 % (ref 0.0–0.2)

## 2020-07-22 LAB — COMPREHENSIVE METABOLIC PANEL
ALT: 66 U/L — ABNORMAL HIGH (ref 0–44)
AST: 39 U/L (ref 15–41)
Albumin: 2.7 g/dL — ABNORMAL LOW (ref 3.5–5.0)
Alkaline Phosphatase: 51 U/L (ref 38–126)
Anion gap: 12 (ref 5–15)
BUN: 32 mg/dL — ABNORMAL HIGH (ref 6–20)
CO2: 26 mmol/L (ref 22–32)
Calcium: 8.8 mg/dL — ABNORMAL LOW (ref 8.9–10.3)
Chloride: 98 mmol/L (ref 98–111)
Creatinine, Ser: 1.21 mg/dL — ABNORMAL HIGH (ref 0.44–1.00)
GFR, Estimated: 53 mL/min — ABNORMAL LOW (ref >60)
Glucose, Bld: 150 mg/dL — ABNORMAL HIGH (ref 70–99)
Potassium: 4.4 mmol/L (ref 3.5–5.1)
Sodium: 136 mmol/L (ref 135–145)
Total Bilirubin: 0.6 mg/dL (ref 0.3–1.2)
Total Protein: 5.8 g/dL — ABNORMAL LOW (ref 6.5–8.1)

## 2020-07-22 LAB — MAGNESIUM: Magnesium: 2.2 mg/dL (ref 1.7–2.4)

## 2020-07-22 LAB — D-DIMER, QUANTITATIVE: D-Dimer, Quant: 1.35 ug/mL-FEU — ABNORMAL HIGH (ref 0.00–0.50)

## 2020-07-22 LAB — BRAIN NATRIURETIC PEPTIDE: B Natriuretic Peptide: 96.5 pg/mL (ref 0.0–100.0)

## 2020-07-22 LAB — C-REACTIVE PROTEIN: CRP: 0.7 mg/dL (ref <1.0)

## 2020-07-22 MED ORDER — ENOXAPARIN SODIUM 40 MG/0.4ML ~~LOC~~ SOLN
40.0000 mg | Freq: Every day | SUBCUTANEOUS | Status: DC
  Administered 2020-07-23 – 2020-07-28 (×6): 40 mg via SUBCUTANEOUS
  Filled 2020-07-22 (×6): qty 0.4

## 2020-07-22 MED ORDER — COLCHICINE 0.6 MG PO TABS
0.6000 mg | ORAL_TABLET | Freq: Once | ORAL | Status: AC
  Administered 2020-07-22: 0.6 mg via ORAL
  Filled 2020-07-22: qty 1

## 2020-07-22 MED ORDER — INDOMETHACIN 25 MG PO CAPS
50.0000 mg | ORAL_CAPSULE | Freq: Once | ORAL | Status: AC
  Administered 2020-07-22: 50 mg via ORAL
  Filled 2020-07-22: qty 2

## 2020-07-22 MED ORDER — METHYLPREDNISOLONE SODIUM SUCC 40 MG IJ SOLR
40.0000 mg | Freq: Once | INTRAMUSCULAR | Status: AC
  Administered 2020-07-22: 40 mg via INTRAVENOUS
  Filled 2020-07-22: qty 1

## 2020-07-22 NOTE — Progress Notes (Signed)
PROGRESS NOTE                                                                                                                                                                                                             Patient Demographics:    Shirley Hansen, is a 55 y.o. female, DOB - [birth year], WCB:[id number]  Outpatient Primary MD for the patient is Pcp, No    LOS - 8  Admit date - 07/14/2020    Chief Complaint  Patient presents with  . Back Pain  . Weakness    Covid        Brief Narrative (HPI from H&P)  - Shirley Hansen is a 55 y.o. female with medical history significant of CVA with left-sided deficits; HTN; and stage 3a CKD presenting with worsening COVID symptoms, positive on 1/24.  She was seen in the ER on 1/24 for progressive and generalized weakness, she had no shortness of breath her main symptom was generalized weakness, in the ER her work-up was consistent with severe dehydration she was found to have extremely high D-dimer, AKI, hypotension and she was admitted   Subjective:   Patient in bed, appears comfortable, she reports poor night sleep, no chest pain, no shortness of breath, reports right toes pain has improved, left foot pain still present but it is improving as well.   Assessment  & Plan :   1. Acute COVID-19 infection - she has received 1 shot of Pfizer vaccine recently, she has minimal pulmonary involvement and symptoms are more generalized with severe dehydration and AKI.  CRP is borderline but her D-dimer is extremely elevated.  She was treated with IV fluids, low-dose steroids and Remdesivir.  This problem has clinically resolved she is symptom-free on room air.  Encouraged the patient to sit up in chair in the daytime use I-S and flutter valve for pulmonary toiletry and then prone in bed when at night.  Will advance activity and titrate down oxygen as possible. -Leukocytosis in the setting of steroids,  procalcitonin is reassuring.   SpO2: 100 %  Recent Labs  Lab 07/16/20 0205 07/17/20 0453 07/18/20 0335 07/19/20 0509 07/21/20 0105 07/22/20 0144  WBC 19.8* 18.1*  --   --  31.2* 26.5*  HGB 10.5* 10.2*  --   --  11.4* 11.9*  HCT 32.1* 31.9*  --   --  33.1* 35.2*  PLT 438* 426*  --   --  436* 384  CRP 2.5* 1.2* 1.1* 0.8 0.7 0.7  BNP 267.7* 247.5*  --   --  69.8 96.5  DDIMER 3.96* 5.50* 3.65* 2.63* 1.66* 1.35*  PROCALCITON  --   --   --   --  <0.10  --   AST 32 28  --   --  42* 39  ALT 26 28  --   --  64* 66*  ALKPHOS 44 42  --   --  46 51  BILITOT 0.2* 0.7  --   --  0.5 0.6  ALBUMIN 2.7* 2.4*  --   --  2.6* 2.7*    2.  Dehydration with AKI.  Hydrate with IV fluids avoid nephrotoxins.  She has underlying CKD 3A with baseline creatinine around 1.5.  3.  History of CVA with left-sided hemiparesis.  Supportive care.  Continue aspirin statin for secondary prevention, PT OT as needed.  4.  High D-dimer with severe dehydration.  High risk for developing a blood clot, leg ultrasound & VQ negative .  5.  Obesity with BMI 32.  Follow with PCP for weight loss.  6.  Possible UTI. Finished  3 days of Rocephin.  7. Hypomagnesemia. Replaced on 07/17/2020.  8. Acute gout affecting both feet.  X-ray unremarkable, uric acid level elevated,  -, Will give another IV steroids today, will give extra dose of colchicine, and will give indomethacin today as well.       Condition -   Guarded  Family Communication  : Patient is coherent, I have discussed the case in details with her, answered all her questions, thank you.  Code Status :  Full  Consults  :  PCCM  Procedures  :    Leg [name]  -ve  VQ Scan  -ve  PUD Prophylaxis : PPI  Disposition Plan  :    Status is: Inpatient  Remains inpatient appropriate because:IV treatments appropriate due to intensity of illness or inability to take PO   Dispo: The patient is from: Home              Anticipated d/c is to: SNF               Anticipated d/c date is: 2 days              Patient currently is not medically stable to d/c.   Difficult to place patient No   DVT Prophylaxis  :  Hep gtt >> Lovenox  Lab Results  Component Value Date   PLT 384 07/22/2020    Diet :  Diet Order            Diet regular Room service appropriate? Yes; Fluid consistency: Thin  Diet effective now                  Inpatient Medications  Scheduled Meds: . amLODipine  10 mg Oral Daily  . vitamin C  500 mg Oral Daily  . aspirin EC  81 mg Oral Daily  . atorvastatin  40 mg Oral QHS  . brimonidine  1 drop Both Eyes BID  . colchicine  0.6 mg Oral Daily  . colchicine  0.6 mg Oral Once  . dorzolamide-timolol  1 drop Both Eyes BID  . enoxaparin (LOVENOX) injection  40 mg Subcutaneous Q12H  . feeding supplement  237 mL Oral BID BM  . gabapentin  300 mg Oral BID  .  hydrocortisone cream   Topical BID  . indomethacin  50 mg Oral Once  . latanoprost  1 drop Both Eyes QHS  . loratadine  10 mg Oral Daily  . methylPREDNISolone (SOLU-MEDROL) injection  40 mg Intravenous Once  . multivitamin with minerals  1 tablet Oral Daily  . pantoprazole  40 mg Oral Daily  . pneumococcal 23 valent vaccine  0.5 mL Intramuscular Tomorrow-1000  . zinc sulfate  220 mg Oral Daily   Continuous Infusions:  PRN Meds:.acetaminophen **OR** [DISCONTINUED] acetaminophen, albuterol, calcium carbonate (dosed in mg elemental calcium), docusate sodium, feeding supplement (NEPRO CARB STEADY), guaiFENesin-dextromethorphan, hydrALAZINE, [DISCONTINUED] ondansetron **OR** ondansetron (ZOFRAN) IV, polyethylene glycol, sorbitol, zolpidem  Antibiotics  :    Anti-infectives (From admission, onward)   Start     Dose/Rate Route Frequency Ordered Stop   07/15/20 1215  cefTRIAXone (ROCEPHIN) 1 g in sodium chloride 0.9 % 100 mL IVPB        1 g 200 mL/hr over 30 Minutes Intravenous Every 24 hours 07/15/20 1116 07/17/20 2005   07/15/20 1000  remdesivir 100 mg in sodium chloride  0.9 % 100 mL IVPB  Status:  Discontinued       "Followed by" Linked Group Details   100 mg 200 mL/hr over 30 Minutes Intravenous Daily 07/14/20 1449 07/14/20 1451   07/15/20 1000  remdesivir 100 mg in sodium chloride 0.9 % 100 mL IVPB       "Followed by" Linked Group Details   100 mg 200 mL/hr over 30 Minutes Intravenous Daily 07/14/20 1451 07/18/20 0942   07/14/20 1600  remdesivir 200 mg in sodium chloride 0.9% 250 mL IVPB       "Followed by" Linked Group Details   200 mg 580 mL/hr over 30 Minutes Intravenous Once 07/14/20 1451 07/14/20 1900   07/14/20 1500  remdesivir 200 mg in sodium chloride 0.9% 250 mL IVPB  Status:  Discontinued       "Followed by" Linked Group Details   200 mg 580 mL/hr over 30 Minutes Intravenous Once 07/14/20 1449 07/14/20 1451       Annelle Behrendt M.D on 07/22/2020 at 12:46 PM  To page go to [email or web address]   [facility] -  Office  [phone]  See all Orders from today for further details    Objective:   Vitals:   07/21/20 2344 07/22/20 0405 07/22/20 0752 07/22/20 1149  BP: (!) 147/86 (!) 136/98 (!) 123/94 125/83  Pulse: 100 94 100 99  Resp: 15 18 17 16   Temp: 98.2 F (36.8 C) 98.1 F (36.7 C) 98.1 F (36.7 C) 98.4 F (36.9 C)  TempSrc: Oral Oral Axillary Oral  SpO2: 100% 100% 100% 100%  Weight:      Height:        Wt Readings from Last 3 Encounters:  07/14/20 79.4 kg     Intake/Output Summary (Last 24 hours) at 07/22/2020 1246 Last data filed at 07/22/2020 1100 Gross per 24 hour  Intake 60 ml  Output 1500 ml  Net -1440 ml     Physical Exam  Awake Alert, Oriented X 3, No new F.N deficits, Normal affect Symmetrical Chest wall movement, Good air movement bilaterally, CTAB RRR,No Gallops,Rubs or new Murmurs, No Parasternal Heave +ve B.Sounds, Abd Soft, No tenderness, No rebound - guarding or rigidity. No Cyanosis, right toe tenderness has resolved, left leg has improved, remains tender but this is improving as  well.     Data Review:    CBC Recent Labs  Lab 07/16/20  0205 07/17/20 0453 07/21/20 0105 07/22/20 0144  WBC 19.8* 18.1* 31.2* 26.5*  HGB 10.5* 10.2* 11.4* 11.9*  HCT 32.1* 31.9* 33.1* 35.2*  PLT 438* 426* 436* 384  MCV 88.9 88.9 88.5 89.8  MCH 29.1 28.4 30.5 30.4  MCHC 32.7 32.0 34.4 33.8  RDW 13.0 13.1 13.7 13.7  LYMPHSABS 1.6 2.4 3.7 3.1  MONOABS 1.5* 1.1* 2.8* 1.0  EOSABS 0.0 0.0 0.0 0.0  BASOSABS 0.0 0.0 0.0 0.1    Recent Labs  Lab 07/16/20 0205 07/17/20 0453 07/18/20 0335 07/19/20 0509 07/21/20 0105 07/22/20 0144  NA 140 140  --  140 137 136  K 4.1 3.8  --  4.3 4.6 4.4  CL 106 106  --  104 99 98  CO2 23 23  --  26 28 26   GLUCOSE 113* 133*  --  147* 126* 150*  BUN 44* 41*  --  28* 32* 32*  CREATININE 1.55* 1.47*  --  1.45* 1.00 1.21*  CALCIUM 9.2 8.8*  --  8.9 8.8* 8.8*  AST 32 28  --   --  42* 39  ALT 26 28  --   --  64* 66*  ALKPHOS 44 42  --   --  46 51  BILITOT 0.2* 0.7  --   --  0.5 0.6  ALBUMIN 2.7* 2.4*  --   --  2.6* 2.7*  MG 1.7 1.6* 1.9 1.9 1.9 2.2  CRP 2.5* 1.2* 1.1* 0.8 0.7 0.7  DDIMER 3.96* 5.50* 3.65* 2.63* 1.66* 1.35*  PROCALCITON  --   --   --   --  <0.10  --   BNP 267.7* 247.5*  --   --  69.8 96.5    ------------------------------------------------------------------------------------------------------------------ No results for input(s): CHOL, HDL, LDLCALC, TRIG, CHOLHDL, LDLDIRECT in the last 72 hours.  No results found for: HGBA1C ------------------------------------------------------------------------------------------------------------------ No results for input(s): TSH, T4TOTAL, T3FREE, THYROIDAB in the last 72 hours.  Invalid input(s): FREET3  Cardiac Enzymes No results for input(s): CKMB, TROPONINI, MYOGLOBIN in the last 168 hours.  Invalid input(s): CK ------------------------------------------------------------------------------------------------------------------    Component Value Date/Time   BNP 96.5 07/22/2020  0144    Micro Results Recent Results (from the past 240 hour(s))  Blood Culture (routine x 2)     Status: None   Collection Time: 07/14/20 11:19 AM   Specimen: BLOOD  Result Value Ref Range Status   Specimen Description BLOOD RIGHT ANTECUBITAL  Final   Special Requests   Final    BOTTLES DRAWN AEROBIC AND ANAEROBIC Blood Culture adequate volume   Culture   Final    NO GROWTH 5 DAYS Performed at [facility], [address] [place], [place] [address]    Report Status 07/19/2020 FINAL  Final  Blood Culture (routine x 2)     Status: None   Collection Time: 07/14/20  4:43 PM   Specimen: BLOOD RIGHT HAND  Result Value Ref Range Status   Specimen Description BLOOD RIGHT HAND  Final   Special Requests   Final    BOTTLES DRAWN AEROBIC ONLY Blood Culture results may not be optimal due to an inadequate volume of blood received in culture bottles   Culture   Final    NO GROWTH 5 DAYS Performed at [facility], [address] [place], [place] [address]    Report Status 07/19/2020 FINAL  Final  Culture, Urine     Status: None   Collection Time: 07/14/20  5:52 PM   Specimen:  Urine, Random  Result Value Ref Range Status   Specimen Description URINE, RANDOM  Final   Special Requests NONE  Final   Culture   Final    NO GROWTH Performed at [facility], [address] [place], [place] [address]    Report Status 07/16/2020 FINAL  Final  MRSA PCR Screening     Status: None   Collection Time: 07/15/20  5:32 AM   Specimen: Nasal Mucosa; Nasopharyngeal  Result Value Ref Range Status   MRSA by PCR NEGATIVE NEGATIVE Final    Comment:        The GeneXpert MRSA Assay (FDA approved for NASAL specimens only), is one component of a comprehensive MRSA colonization surveillance program. It is not intended to diagnose MRSA infection nor to guide or monitor treatment for MRSA infections. Performed at [facility], [address] [place], [place] [address]      Radiology Reports DG Chest 2 View  Result Date: 07/05/2020 CLINICAL DATA:  Shortness of breath and fatigue EXAM: CHEST - 2 VIEW COMPARISON:  None. FINDINGS: Indistinct densities at the lung bases on the frontal view. No edema, effusion, or pneumothorax. Normal heart size and mediastinal contours. IMPRESSION: Mild infiltrate or atelectasis at the lung bases, lateral view and low volumes favoring atelectasis. Electronically Signed   By: Marnee Spring M.D.   On: 07/05/2020 10:26   CT Head Wo Contrast  Result Date: 07/05/2020 CLINICAL DATA:  Worsening left-sided weakness (history of CVA) also with history of nontraumatic ICH. EXAM: CT HEAD WITHOUT CONTRAST TECHNIQUE: Contiguous axial images were obtained from the base of the skull through the vertex without intravenous contrast. COMPARISON:  No pertinent prior exams available for comparison. FINDINGS: Brain: Cerebral volume is normal for age. There is a subcentimeter hypodensity within the right thalamocapsular junction with subtle peripheral curvilinear hyperdensity (for instance as seen on series 3, images 15-17). There are chronic lacunar infarcts within the left corona radiata and left pons. No demarcated cortical infarct. No extra-axial fluid collection. No evidence of intracranial mass. No midline shift. Vascular: No hyperdense vessel.  Atherosclerotic calcifications Skull: Normal. Negative for fracture or focal lesion. Sinuses/Orbits: Visualized orbits show no acute finding. No significant paranasal sinus disease at the imaged levels. These results were called by telephone at the time of interpretation on 07/05/2020 at 11:42 am to provider [facility] , who verbally acknowledged these results. IMPRESSION: Subcentimeter hypodensity within the right thalamocapsular junction with subtle peripheral curvilinear hyperdensity. This is favored to reflect subacute/chronic blood products at site of a subacute/chronic lacunar infarct. However, correlation  with the patient's history and any available prior outside imaging is necessary. If this does not correlate with the patient's history and outside imaging, a contrast-enhanced brain MRI is recommended for further evaluation. Chronic lacunar infarcts within the left corona radiata and left basal ganglia. Electronically Signed   By: Jackey Loge DO   On: 07/05/2020 11:43   MR BRAIN WO CONTRAST  Result Date: 07/05/2020 CLINICAL DATA:  Neuro deficit, acute, stroke suspected History of thalamic ICH EXAM: MRI HEAD WITHOUT CONTRAST TECHNIQUE: Multiplanar, multiecho pulse sequences of the brain and surrounding structures were obtained without intravenous contrast. COMPARISON:  07/05/2020. FINDINGS: Brain: No focal restricted diffusion. Remote microhemorrhages involving the left thalamus, cerebellum and left basal ganglia. Sequela of remote insult involving the right thalamus with associated hemosiderin deposition. No midline shift, ventriculomegaly or extra-axial fluid collection. No mass lesion. Chronic left corona radiata and pontine lacunar insults. Minimal chronic microvascular  ischemic changes. Vascular: Normal flow voids. Skull and upper cervical spine: Normal marrow signal. Sinuses/Orbits: Normal orbits. Pneumatized paranasal sinuses. Bilateral mastoid free fluid. Other: None. IMPRESSION: Sequela of prior right thalamic hemorrhage. No acute intracranial hemorrhage or infarct. Remote insults involving the left corona radiata and pons. Sequela of hypertensive microangiopathy. Electronically Signed   By: Stana Bunting M.D.   On: 07/05/2020 16:14   NM Pulmonary Perfusion  Result Date: 07/15/2020 CLINICAL DATA:  Elevated D-dimer, high risk of pulmonary embolism due to recent COVID infection EXAM: NUCLEAR MEDICINE PERFUSION LUNG SCAN TECHNIQUE: Perfusion images were obtained in multiple projections after intravenous injection of radiopharmaceutical. Ventilation scans intentionally deferred if perfusion scan  and chest x-ray adequate for interpretation during COVID 19 epidemic. RADIOPHARMACEUTICALS:  4.3 mCi Tc-33m MAA IV COMPARISON:  Chest radiograph 07/15/2020 FINDINGS: Normal perfusion lung scan. No perfusion defects identified. IMPRESSION: Normal perfusion lung scan. Electronically Signed   By: Ulyses Southward M.D.   On: 07/15/2020 15:54   US RENAL  Result Date: 07/14/2020 CLINICAL DATA:  Acute renal injury EXAM: RENAL / URINARY TRACT ULTRASOUND COMPLETE COMPARISON:  None. FINDINGS: Right Kidney: Renal measurements: 9.4 x 4.0 x 4.3 cm. = volume: 83 mL. Echogenicity within normal limits. No mass or hydronephrosis visualized. Left Kidney: Renal measurements: 10.8 x 5.2 x 4.2 cm. = volume: 124 mL. Echogenicity within normal limits. No mass or hydronephrosis visualized. Bladder: Appears normal for degree of bladder distention. Other: None. IMPRESSION: No acute abnormality noted. Electronically Signed   By: Alcide Clever M.D.   On: 07/14/2020 21:15   DG Chest Port 1 View  Result Date: 07/15/2020 CLINICAL DATA:  Shortness of breath EXAM: PORTABLE CHEST 1 VIEW COMPARISON:  07/14/2020 FINDINGS: Numerous leads and wires project over the chest. Midline trachea. Normal heart size. Atherosclerosis in the transverse aorta. No pleural effusion or pneumothorax. Clear lungs. IMPRESSION: No acute cardiopulmonary disease. Aortic Atherosclerosis (ICD10-I70.0). Electronically Signed   By: Jeronimo Greaves M.D.   On: 07/15/2020 09:08   DG Chest Portable 1 View  Result Date: 07/14/2020 CLINICAL DATA:  Dyspnea, COVID pneumonia EXAM: PORTABLE CHEST 1 VIEW COMPARISON:  07/05/2020 FINDINGS: The heart size and mediastinal contours are within normal limits. Both lungs are clear. The visualized skeletal structures are unremarkable. IMPRESSION: No active disease. Electronically Signed   By: Helyn Numbers MD   On: 07/14/2020 10:43   DG Foot 2 Views Left  Result Date: 07/18/2020 CLINICAL DATA:  Left foot pain without known injury. EXAM: LEFT  FOOT - 2 VIEW COMPARISON:  None. FINDINGS: There is no evidence of fracture or dislocation. There is no evidence of arthropathy or other focal bone abnormality. Soft tissues are unremarkable. IMPRESSION: Negative. Electronically Signed   By: Lupita Raider M.D.   On: 07/18/2020 10:11   VAS [name] LOWER EXTREMITY VENOUS (DVT)  Result Date: 07/15/2020  Lower Venous DVT Study Indications: Edema.  Comparison Study: no prior Performing Technologist: Blanch Media RVS  Examination Guidelines: A complete evaluation includes B-mode imaging, spectral Doppler, color Doppler, and power Doppler as needed of all accessible portions of each vessel. Bilateral testing is considered an integral part of a complete examination. Limited examinations for reoccurring indications may be performed as noted. The reflux portion of the exam is performed with the patient in reverse Trendelenburg.  +---------+---------------+---------+-----------+----------+--------------+ RIGHT    CompressibilityPhasicitySpontaneityPropertiesThrombus Aging +---------+---------------+---------+-----------+----------+--------------+ CFV      Full           Yes      Yes                                 +---------+---------------+---------+-----------+----------+--------------+  SFJ      Full                                                        +---------+---------------+---------+-----------+----------+--------------+ FV Prox  Full                                                        +---------+---------------+---------+-----------+----------+--------------+ FV Mid   Full                                                        +---------+---------------+---------+-----------+----------+--------------+ FV DistalFull                                                        +---------+---------------+---------+-----------+----------+--------------+ PFV      Full                                                         +---------+---------------+---------+-----------+----------+--------------+ POP      Full           Yes      Yes                                 +---------+---------------+---------+-----------+----------+--------------+ PTV      Full                                                        +---------+---------------+---------+-----------+----------+--------------+ PERO     Full                                                        +---------+---------------+---------+-----------+----------+--------------+   +---------+---------------+---------+-----------+----------+--------------+ LEFT     CompressibilityPhasicitySpontaneityPropertiesThrombus Aging +---------+---------------+---------+-----------+----------+--------------+ CFV      Full           Yes      Yes                                 +---------+---------------+---------+-----------+----------+--------------+ SFJ      Full                                                        +---------+---------------+---------+-----------+----------+--------------+  FV Prox  Full                                                        +---------+---------------+---------+-----------+----------+--------------+ FV Mid   Full                                                        +---------+---------------+---------+-----------+----------+--------------+ FV DistalFull                                                        +---------+---------------+---------+-----------+----------+--------------+ PFV      Full                                                        +---------+---------------+---------+-----------+----------+--------------+ POP      Full           Yes      Yes                                 +---------+---------------+---------+-----------+----------+--------------+ PTV      Full                                                         +---------+---------------+---------+-----------+----------+--------------+ PERO     Full                                                        +---------+---------------+---------+-----------+----------+--------------+     Summary: BILATERAL: - No evidence of deep vein thrombosis seen in the lower extremities, bilaterally. - No evidence of superficial venous thrombosis in the lower extremities, bilaterally. -No evidence of popliteal cyst, bilaterally.   *See table(s) above for measurements and observations. Electronically signed by Heath Lark on 07/15/2020 at 3:49:54 PM.    Final

## 2020-07-22 NOTE — Progress Notes (Signed)
Physical Therapy Treatment Patient Details Name: Shirley Hansen MRN: [id number] DOB: 1965-06-30 Today's Date: 07/22/2020    History of Present Illness Pt is a 55 y.o. female who tested (+) COVID-19 on 07/05/20, now admitted 07/14/20 with generalized weakness; workup for dehydration, AKI, acute COVID-19 infection. PMH includes CVA (residual L-side deficits), HTN, CKD 3.   PT Comments    Pt progressing with mobility. Today's session focused on transfer and gait training with RW; pt able to walk short distance with RW and minA. Pt remains limited by generalized weakness (in addition to L-side residual weakness), decreased activity tolerance, foot pain, and impaired cognition, including decreased attention, poor awareness and difficulty problem solving. Continue to recommend SNF-level therapies to maximize functinoal mobility and independence prior to return home.   Follow Up Recommendations  SNF;Supervision for mobility/OOB     Equipment Recommendations  None recommended by PT    Recommendations for Other Services       Precautions / Restrictions Precautions Precautions: Fall;Other (comment) Precaution Comments: Residual L-side weakness; chronic foot pain (pt attributes to gout and stroke) Restrictions Weight Bearing Restrictions: No    Mobility  Bed Mobility Overal bed mobility: Needs Assistance Bed Mobility: Supine to Sit     Supine to sit: Supervision;HOB elevated     General bed mobility comments: Much improved sequencing and problem solving compared to yesterday's session, pt only requiring cues to stay on task as she self-distracts; no physical assist required    Transfers Overall transfer level: Needs assistance Equipment used: Rolling walker (2 wheeled) Transfers: Sit to/from Stand Sit to Stand: Min assist         General transfer comment: Increased time and effort preparing to stand, minA to assist trunk elevation standing from EOB to  RW  Ambulation/Gait Ambulation/Gait assistance: Min guard;Min assist Gait Distance (Feet): 32 Feet Assistive device: Rolling walker (2 wheeled) Gait Pattern/deviations: Step-through pattern;Decreased stride length;Antalgic;Trunk flexed Gait velocity: Decreased   General Gait Details: Very slowed, labored, antalgic gait (c/o L foot pain) with RW and close min guard for balance; with increased fatigue L hand losing grip ib RW initially requiring minA to maintain balance, pt able to self-correct L hand placement   Stairs             Wheelchair Mobility    Modified Rankin (Stroke Patients Only)       Balance Overall balance assessment: Needs assistance Sitting-balance support: Feet supported Sitting balance-Leahy Scale: Fair     Standing balance support: Single extremity supported;During functional activity;Bilateral upper extremity supported Standing balance-Leahy Scale: Poor Standing balance comment: Reliant on at least single UE support to maintain static standing                            Cognition Arousal/Alertness: Awake/alert Behavior During Therapy: WFL for tasks assessed/performed   Area of Impairment: Attention;Following commands;Safety/judgement;Awareness;Problem solving                   Current Attention Level: Selective   Following Commands: Follows one step commands consistently Safety/Judgement: Decreased awareness of safety Awareness: Emergent Problem Solving: Slow processing;Decreased initiation;Requires verbal cues        Exercises      General Comments General comments (skin integrity, edema, etc.): HR up to 130s; noted improved AROM in LUE, pt still requiring encouragement to use more frequently      Pertinent Vitals/Pain Pain Assessment: Faces Faces Pain Scale: Hurts a little bit Pain Location: L  foot Pain Descriptors / Indicators: Discomfort;Burning Pain Intervention(s): Monitored during session    Home Living                       Prior Function            PT Goals (current goals can now be found in the care plan section) Progress towards PT goals: Progressing toward goals    Frequency    Min 2X/week      PT Plan Current plan remains appropriate    Co-evaluation              AM-PAC PT "6 Clicks" Mobility   Outcome Measure  Help needed turning from your back to your side while in a flat bed without using bedrails?: None Help needed moving from lying on your back to sitting on the side of a flat bed without using bedrails?: A Little Help needed moving to and from a bed to a chair (including a wheelchair)?: A Little Help needed standing up from a chair using your arms (e.g., wheelchair or bedside chair)?: A Little Help needed to walk in hospital room?: A Little Help needed climbing 3-5 steps with a railing? : A Lot 6 Click Score: 18    End of Session Equipment Utilized During Treatment: Gait belt Activity Tolerance: Patient tolerated treatment well Patient left: in chair;with call bell/phone within reach Nurse Communication: Mobility status PT Visit Diagnosis: Unsteadiness on feet (R26.81);Other abnormalities of gait and mobility (R26.89);Muscle weakness (generalized) (M62.81);Pain     Time: 5638-9373 PT Time Calculation (min) (ACUTE ONLY): 35 min  Charges:  $Gait Training: 8-22 mins $Therapeutic Activity: 8-22 mins                    Ina Homes, PT, DPT Acute Rehabilitation Services  Pager [phone] Office [phone]  Malachy Chamber 07/22/2020, 4:57 PM

## 2020-07-22 NOTE — Plan of Care (Signed)
  Problem: Education: Goal: Knowledge of risk factors and measures for prevention of condition will improve Outcome: Progressing   Problem: Education: Goal: Knowledge of General Education information will improve Description: Including pain rating scale, medication(s)/side effects and non-pharmacologic comfort measures Outcome: Progressing   Problem: Health Behavior/Discharge Planning: Goal: Ability to manage health-related needs will improve Outcome: Progressing   Problem: Activity: Goal: Risk for activity intolerance will decrease Outcome: Progressing   Problem: Pain Managment: Goal: General experience of comfort will improve Outcome: Progressing

## 2020-07-22 NOTE — Progress Notes (Signed)
Nutrition Follow-up  DOCUMENTATION CODES:   Obesity unspecified  INTERVENTION:  Continue Ensure Enlive po BID, each supplement provides 350 kcal and 20 grams of protein  Encourage adequate PO intake.  NUTRITION DIAGNOSIS:   Increased nutrient needs related to acute illness (COVID-19) as evidenced by estimated needs; ongoing  GOAL:   Patient will meet greater than or equal to 90% of their needs; met  MONITOR:   PO intake,Supplement acceptance,Weight trends,I & O's,Skin,Labs  REASON FOR ASSESSMENT:   Malnutrition Screening Tool    ASSESSMENT:   Pt admitted with acute respiratory failure wit hypoxia 2/2 COVID-19 PNA. PMH includes h/o stroke, HTN, stage 3 CKD.  Meal completion has been 100%. Pt currently has Ensure ordered with varied consumption. RD to continue with current orders to aid in caloric and protein needs. Pt with acute gout affecting both feet, IV steroids given.   Labs and medications reviewed.   Diet Order:   Diet Order            Diet regular Room service appropriate? Yes; Fluid consistency: Thin  Diet effective now                 EDUCATION NEEDS:   No education needs have been identified at this time  Skin:  Skin Assessment: Reviewed RN Assessment Skin Integrity Issues:: Other (Comment) Other: unstaged pressure injury noted to L elbow per RN assessment  Last BM:  2/9  Height:   Ht Readings from Last 1 Encounters:  07/14/20 _0  (1.575 m)    Weight:   Wt Readings from Last 1 Encounters:  07/14/20 79.4 kg   BMI:  Body mass index is 32.01 kg/m.  Estimated Nutritional Needs:   Kcal:  1800-2000  Protein:  90-100 grams  Fluid:  >1.8L  Corrin Parker, MS, RD, LDN RD pager number/after hours weekend pager number on Amion.

## 2020-07-23 DIAGNOSIS — M79671 Pain in right foot: Secondary | ICD-10-CM | POA: Diagnosis not present

## 2020-07-23 DIAGNOSIS — U071 COVID-19: Secondary | ICD-10-CM | POA: Diagnosis not present

## 2020-07-23 DIAGNOSIS — M79672 Pain in left foot: Secondary | ICD-10-CM | POA: Diagnosis not present

## 2020-07-23 DIAGNOSIS — N179 Acute kidney failure, unspecified: Secondary | ICD-10-CM | POA: Diagnosis not present

## 2020-07-23 LAB — CBC WITH DIFFERENTIAL/PLATELET
Abs Immature Granulocytes: 2.26 10*3/uL — ABNORMAL HIGH (ref 0.00–0.07)
Basophils Absolute: 0.1 10*3/uL (ref 0.0–0.1)
Basophils Relative: 0 %
Eosinophils Absolute: 0 10*3/uL (ref 0.0–0.5)
Eosinophils Relative: 0 %
HCT: 36.1 % (ref 36.0–46.0)
Hemoglobin: 11.7 g/dL — ABNORMAL LOW (ref 12.0–15.0)
Immature Granulocytes: 7 %
Lymphocytes Relative: 9 %
Lymphs Abs: 2.9 10*3/uL (ref 0.7–4.0)
MCH: 29.1 pg (ref 26.0–34.0)
MCHC: 32.4 g/dL (ref 30.0–36.0)
MCV: 89.8 fL (ref 80.0–100.0)
Monocytes Absolute: 2.2 10*3/uL — ABNORMAL HIGH (ref 0.1–1.0)
Monocytes Relative: 7 %
Neutro Abs: 24.1 10*3/uL — ABNORMAL HIGH (ref 1.7–7.7)
Neutrophils Relative %: 77 %
Platelets: 356 10*3/uL (ref 150–400)
RBC: 4.02 MIL/uL (ref 3.87–5.11)
RDW: 13.8 % (ref 11.5–15.5)
WBC: 31.6 10*3/uL — ABNORMAL HIGH (ref 4.0–10.5)
nRBC: 0.1 % (ref 0.0–0.2)

## 2020-07-23 LAB — COMPREHENSIVE METABOLIC PANEL
ALT: 58 U/L — ABNORMAL HIGH (ref 0–44)
AST: 32 U/L (ref 15–41)
Albumin: 2.7 g/dL — ABNORMAL LOW (ref 3.5–5.0)
Alkaline Phosphatase: 45 U/L (ref 38–126)
Anion gap: 10 (ref 5–15)
BUN: 28 mg/dL — ABNORMAL HIGH (ref 6–20)
CO2: 27 mmol/L (ref 22–32)
Calcium: 8.7 mg/dL — ABNORMAL LOW (ref 8.9–10.3)
Chloride: 99 mmol/L (ref 98–111)
Creatinine, Ser: 1.02 mg/dL — ABNORMAL HIGH (ref 0.44–1.00)
GFR, Estimated: >60 mL/min (ref >60)
Glucose, Bld: 126 mg/dL — ABNORMAL HIGH (ref 70–99)
Potassium: 4.4 mmol/L (ref 3.5–5.1)
Sodium: 136 mmol/L (ref 135–145)
Total Bilirubin: 0.5 mg/dL (ref 0.3–1.2)
Total Protein: 5.8 g/dL — ABNORMAL LOW (ref 6.5–8.1)

## 2020-07-23 LAB — PROCALCITONIN: Procalcitonin: <0.1 ng/mL

## 2020-07-23 MED ORDER — INDOMETHACIN 25 MG PO CAPS
50.0000 mg | ORAL_CAPSULE | Freq: Once | ORAL | Status: AC
  Administered 2020-07-23: 50 mg via ORAL
  Filled 2020-07-23 (×2): qty 2

## 2020-07-23 NOTE — Progress Notes (Signed)
PROGRESS NOTE                                                                                                                                                                                                             Patient Demographics:    Unknown Flannigan, is a 55 y.o. female, DOB - [birth year], XTK:[id number]  Outpatient Primary MD for the patient is Pcp, No    LOS - 9  Admit date - 07/14/2020    Chief Complaint  Patient presents with  . Back Pain  . Weakness    Covid        Brief Narrative (HPI from H&P)   - Anesha Florido is a 55 y.o. female with medical history significant of CVA with left-sided deficits; HTN; and stage 3a CKD presenting with worsening COVID symptoms, positive on 1/24.  She was seen in the ER on 1/24 for progressive and generalized weakness, she had no shortness of breath her main symptom was generalized weakness, in the ER her work-up was consistent with severe dehydration she was found to have extremely high D-dimer, AKI, hypotension and she was admitted   Subjective:   Patient in bed, reports the pain has significantly subsided, she was able to stand up yesterday, denies any chest pain or shortness of breath.   Assessment  & Plan :    Acute COVID-19 infection  - she has received 1 shot of Pfizer vaccine recently, she has minimal pulmonary involvement and symptoms are more generalized with severe dehydration and AKI.  CRP is borderline but her D-dimer is extremely elevated.  She was treated with IV fluids, low-dose steroids and Remdesivir.  This problem has clinically resolved she is symptom-free on room air.  Encouraged the patient to sit up in chair in the daytime use I-S and flutter valve for pulmonary toiletry and then prone in bed when at night.  Will advance activity and titrate down oxygen as possible. -Leukocytosis in the setting of steroids, procalcitonin is reassuring.   SpO2: 100 %  Recent  Labs  Lab 07/17/20 0453 07/18/20 0335 07/19/20 0509 07/21/20 0105 07/22/20 0144 07/23/20 0131  WBC 18.1*  --   --  31.2* 26.5* 31.6*  HGB 10.2*  --   --  11.4* 11.9* 11.7*  HCT 31.9*  --   --  33.1* 35.2* 36.1  PLT  426*  --   --  436* 384 356  CRP 1.2* 1.1* 0.8 0.7 0.7  --   BNP 247.5*  --   --  69.8 96.5  --   DDIMER 5.50* 3.65* 2.63* 1.66* 1.35*  --   PROCALCITON  --   --   --  <0.10  --  <0.10  AST 28  --   --  42* 39 32  ALT 28  --   --  64* 66* 58*  ALKPHOS 42  --   --  46 51 45  BILITOT 0.7  --   --  0.5 0.6 0.5  ALBUMIN 2.4*  --   --  2.6* 2.7* 2.7*    Dehydration with AKI on CKD stage IIIa -Resolved with IV fluids.  Transaminitis -Due to Covid, trending down.  History of CVA with left-sided hemiparesis.   -Supportive care.  Continue aspirin statin for secondary prevention, PT OT as needed.  High D-dimer - leg ultrasound & VQ negative .  Trending down  Obesity with BMI 32.  Follow with PCP for weight loss.  UTI.  -Finished  3 days of Rocephin.  Hypomagnesemia. Replaced on 07/17/2020.  Acute gout affecting both feet.  X-ray unremarkable, uric acid level elevated,  -Require as needed steroids and indomethacin, improving with colchicine.  Given stable renal function will give another dose of indomethacin today.  leukocytocis -In the setting of steroids, procalcitonin within normal limit, she is nontoxic, afebrile      Condition -   Guarded  Family Communication  : Patient is coherent, I have discussed the case in details with her, answered all her questions, thank you.  Code Status :  Full  Consults  :  PCCM  Procedures  :    Leg [name]  -ve  VQ Scan  -ve  PUD Prophylaxis : PPI  Disposition Plan  :    Status is: Inpatient  Remains inpatient appropriate because:IV treatments appropriate due to intensity of illness or inability to take PO   Dispo: The patient is from: Home              Anticipated d/c is to: SNF              Anticipated d/c date  is: 1 day              Patient currently is  medically stable to d/c.   Difficult to place patient Yes   DVT Prophylaxis  :  Hep gtt >> Lovenox  Lab Results  Component Value Date   PLT 356 07/23/2020    Diet :  Diet Order            Diet regular Room service appropriate? Yes; Fluid consistency: Thin  Diet effective now                  Inpatient Medications  Scheduled Meds: . amLODipine  10 mg Oral Daily  . vitamin C  500 mg Oral Daily  . aspirin EC  81 mg Oral Daily  . atorvastatin  40 mg Oral QHS  . brimonidine  1 drop Both Eyes BID  . colchicine  0.6 mg Oral Daily  . dorzolamide-timolol  1 drop Both Eyes BID  . enoxaparin (LOVENOX) injection  40 mg Subcutaneous Daily  . feeding supplement  237 mL Oral BID BM  . gabapentin  300 mg Oral BID  . hydrocortisone cream   Topical BID  . latanoprost  1 drop Both Eyes  QHS  . loratadine  10 mg Oral Daily  . multivitamin with minerals  1 tablet Oral Daily  . pantoprazole  40 mg Oral Daily  . pneumococcal 23 valent vaccine  0.5 mL Intramuscular Tomorrow-1000  . zinc sulfate  220 mg Oral Daily   Continuous Infusions:  PRN Meds:.acetaminophen **OR** [DISCONTINUED] acetaminophen, albuterol, calcium carbonate (dosed in mg elemental calcium), docusate sodium, feeding supplement (NEPRO CARB STEADY), guaiFENesin-dextromethorphan, hydrALAZINE, [DISCONTINUED] ondansetron **OR** ondansetron (ZOFRAN) IV, polyethylene glycol, sorbitol, zolpidem  Antibiotics  :    Anti-infectives (From admission, onward)   Start     Dose/Rate Route Frequency Ordered Stop   07/15/20 1215  cefTRIAXone (ROCEPHIN) 1 g in sodium chloride 0.9 % 100 mL IVPB        1 g 200 mL/hr over 30 Minutes Intravenous Every 24 hours 07/15/20 1116 07/17/20 2005   07/15/20 1000  remdesivir 100 mg in sodium chloride 0.9 % 100 mL IVPB  Status:  Discontinued       "Followed by" Linked Group Details   100 mg 200 mL/hr over 30 Minutes Intravenous Daily 07/14/20 1449 07/14/20  1451   07/15/20 1000  remdesivir 100 mg in sodium chloride 0.9 % 100 mL IVPB       "Followed by" Linked Group Details   100 mg 200 mL/hr over 30 Minutes Intravenous Daily 07/14/20 1451 07/18/20 0942   07/14/20 1600  remdesivir 200 mg in sodium chloride 0.9% 250 mL IVPB       "Followed by" Linked Group Details   200 mg 580 mL/hr over 30 Minutes Intravenous Once 07/14/20 1451 07/14/20 1900   07/14/20 1500  remdesivir 200 mg in sodium chloride 0.9% 250 mL IVPB  Status:  Discontinued       "Followed by" Linked Group Details   200 mg 580 mL/hr over 30 Minutes Intravenous Once 07/14/20 1449 07/14/20 1451       Gunther Zawadzki M.D on 07/23/2020 at 12:41 PM  To page go to [email or web address]   [facility] -  Office  [phone]  See all Orders from today for further details    Objective:   Vitals:   07/22/20 2000 07/23/20 0002 07/23/20 0433 07/23/20 0739  BP: 125/83 122/77 126/86 117/86  Pulse: 97 93 88 87  Resp: 17 16 17 20   Temp: 98.3 F (36.8 C) 98 F (36.7 C) 98.2 F (36.8 C) 98.1 F (36.7 C)  TempSrc: Oral Oral Oral Axillary  SpO2: 100% 100% 100% 100%  Weight:      Height:        Wt Readings from Last 3 Encounters:  07/14/20 79.4 kg     Intake/Output Summary (Last 24 hours) at 07/23/2020 1241 Last data filed at 07/23/2020 1122 Gross per 24 hour  Intake 240 ml  Output 550 ml  Net -310 ml     Physical Exam  Awake Alert, Oriented X 3, No new F.N deficits, Normal affect Symmetrical Chest wall movement, Good air movement bilaterally, CTAB RRR,No Gallops,Rubs or new Murmurs, No Parasternal Heave +ve B.Sounds, Abd Soft, No tenderness, No rebound - guarding or rigidity. No Cyanosis, Clubbing left foot edema and erythema has improved     Data Review:    CBC Recent Labs  Lab 07/17/20 0453 07/21/20 0105 07/22/20 0144 07/23/20 0131  WBC 18.1* 31.2* 26.5* 31.6*  HGB 10.2* 11.4* 11.9* 11.7*  HCT 31.9* 33.1* 35.2* 36.1  PLT 426* 436* 384 356  MCV 88.9  88.5 89.8 89.8  MCH 28.4 30.5 30.4 29.1  MCHC 32.0 34.4 33.8 32.4  RDW 13.1 13.7 13.7 13.8  LYMPHSABS 2.4 3.7 3.1 2.9  MONOABS 1.1* 2.8* 1.0 2.2*  EOSABS 0.0 0.0 0.0 0.0  BASOSABS 0.0 0.0 0.1 0.1    Recent Labs  Lab 07/17/20 0453 07/18/20 0335 07/19/20 0509 07/21/20 0105 07/22/20 0144 07/23/20 0131  NA 140  --  140 137 136 136  K 3.8  --  4.3 4.6 4.4 4.4  CL 106  --  104 99 98 99  CO2 23  --  26 28 26 27   GLUCOSE 133*  --  147* 126* 150* 126*  BUN 41*  --  28* 32* 32* 28*  CREATININE 1.47*  --  1.45* 1.00 1.21* 1.02*  CALCIUM 8.8*  --  8.9 8.8* 8.8* 8.7*  AST 28  --   --  42* 39 32  ALT 28  --   --  64* 66* 58*  ALKPHOS 42  --   --  46 51 45  BILITOT 0.7  --   --  0.5 0.6 0.5  ALBUMIN 2.4*  --   --  2.6* 2.7* 2.7*  MG 1.6* 1.9 1.9 1.9 2.2  --   CRP 1.2* 1.1* 0.8 0.7 0.7  --   DDIMER 5.50* 3.65* 2.63* 1.66* 1.35*  --   PROCALCITON  --   --   --  <0.10  --  <0.10  BNP 247.5*  --   --  69.8 96.5  --     ------------------------------------------------------------------------------------------------------------------ No results for input(s): CHOL, HDL, LDLCALC, TRIG, CHOLHDL, LDLDIRECT in the last 72 hours.  No results found for: HGBA1C ------------------------------------------------------------------------------------------------------------------ No results for input(s): TSH, T4TOTAL, T3FREE, THYROIDAB in the last 72 hours.  Invalid input(s): FREET3  Cardiac Enzymes No results for input(s): CKMB, TROPONINI, MYOGLOBIN in the last 168 hours.  Invalid input(s): CK ------------------------------------------------------------------------------------------------------------------    Component Value Date/Time   BNP 96.5 07/22/2020 0144    Micro Results Recent Results (from the past 240 hour(s))  Blood Culture (routine x 2)     Status: None   Collection Time: 07/14/20 11:19 AM   Specimen: BLOOD  Result Value Ref Range Status   Specimen Description BLOOD RIGHT  ANTECUBITAL  Final   Special Requests   Final    BOTTLES DRAWN AEROBIC AND ANAEROBIC Blood Culture adequate volume   Culture   Final    NO GROWTH 5 DAYS Performed at [facility], [address] [place], [place] [address]    Report Status 07/19/2020 FINAL  Final  Blood Culture (routine x 2)     Status: None   Collection Time: 07/14/20  4:43 PM   Specimen: BLOOD RIGHT HAND  Result Value Ref Range Status   Specimen Description BLOOD RIGHT HAND  Final   Special Requests   Final    BOTTLES DRAWN AEROBIC ONLY Blood Culture results may not be optimal due to an inadequate volume of blood received in culture bottles   Culture   Final    NO GROWTH 5 DAYS Performed at [facility], [address], [place], [place] [address]    Report Status 07/19/2020 FINAL  Final  Culture, Urine     Status: None   Collection Time: 07/14/20  5:52 PM   Specimen: Urine, Random  Result Value Ref Range Status   Specimen Description URINE, RANDOM  Final   Special Requests NONE  Final   Culture   Final    NO GROWTH Performed at [facility], [address], [place], [place] [address]    Report Status 07/16/2020 FINAL  Final  MRSA PCR Screening     Status: None   Collection Time: 07/15/20  5:32 AM   Specimen: Nasal Mucosa; Nasopharyngeal  Result Value Ref Range Status   MRSA by PCR NEGATIVE NEGATIVE Final    Comment:        The GeneXpert MRSA Assay (FDA approved for NASAL specimens only), is one component of a comprehensive MRSA colonization surveillance program. It is not intended to diagnose MRSA infection nor to guide or monitor treatment for MRSA infections. Performed at [facility], [address] [place], [place] [address]     Radiology Reports DG Chest 2 View  Result Date: 07/05/2020 CLINICAL DATA:  Shortness of breath and fatigue EXAM: CHEST - 2 VIEW COMPARISON:  None. FINDINGS: Indistinct densities at the lung bases on the frontal view. No edema, effusion, or  pneumothorax. Normal heart size and mediastinal contours. IMPRESSION: Mild infiltrate or atelectasis at the lung bases, lateral view and low volumes favoring atelectasis. Electronically Signed   By: Marnee Spring M.D.   On: 07/05/2020 10:26   CT Head Wo Contrast  Result Date: 07/05/2020 CLINICAL DATA:  Worsening left-sided weakness (history of CVA) also with history of nontraumatic ICH. EXAM: CT HEAD WITHOUT CONTRAST TECHNIQUE: Contiguous axial images were obtained from the base of the skull through the vertex without intravenous contrast. COMPARISON:  No pertinent prior exams available for comparison. FINDINGS: Brain: Cerebral volume is normal for age. There is a subcentimeter hypodensity within the right thalamocapsular junction with subtle peripheral curvilinear hyperdensity (for instance as seen on series 3, images 15-17). There are chronic lacunar infarcts within the left corona radiata and left pons. No demarcated cortical infarct. No extra-axial fluid collection. No evidence of intracranial mass. No midline shift. Vascular: No hyperdense vessel.  Atherosclerotic calcifications Skull: Normal. Negative for fracture or focal lesion. Sinuses/Orbits: Visualized orbits show no acute finding. No significant paranasal sinus disease at the imaged levels. These results were called by telephone at the time of interpretation on 07/05/2020 at 11:42 am to provider [facility] , who verbally acknowledged these results. IMPRESSION: Subcentimeter hypodensity within the right thalamocapsular junction with subtle peripheral curvilinear hyperdensity. This is favored to reflect subacute/chronic blood products at site of a subacute/chronic lacunar infarct. However, correlation with the patient's history and any available prior outside imaging is necessary. If this does not correlate with the patient's history and outside imaging, a contrast-enhanced brain MRI is recommended for further evaluation. Chronic lacunar infarcts  within the left corona radiata and left basal ganglia. Electronically Signed   By: Jackey Loge DO   On: 07/05/2020 11:43   MR BRAIN WO CONTRAST  Result Date: 07/05/2020 CLINICAL DATA:  Neuro deficit, acute, stroke suspected History of thalamic ICH EXAM: MRI HEAD WITHOUT CONTRAST TECHNIQUE: Multiplanar, multiecho pulse sequences of the brain and surrounding structures were obtained without intravenous contrast. COMPARISON:  07/05/2020. FINDINGS: Brain: No focal restricted diffusion. Remote microhemorrhages involving the left thalamus, cerebellum and left basal ganglia. Sequela of remote insult involving the right thalamus with associated hemosiderin deposition. No midline shift, ventriculomegaly or extra-axial fluid collection. No mass lesion. Chronic left corona radiata and pontine lacunar insults. Minimal chronic microvascular ischemic changes. Vascular: Normal flow voids. Skull and upper cervical spine: Normal marrow signal. Sinuses/Orbits: Normal orbits. Pneumatized paranasal sinuses. Bilateral mastoid free fluid. Other: None. IMPRESSION: Sequela of prior right thalamic hemorrhage. No acute intracranial hemorrhage or  infarct. Remote insults involving the left corona radiata and pons. Sequela of hypertensive microangiopathy. Electronically Signed   By: Stana Bunting M.D.   On: 07/05/2020 16:14   NM Pulmonary Perfusion  Result Date: 07/15/2020 CLINICAL DATA:  Elevated D-dimer, high risk of pulmonary embolism due to recent COVID infection EXAM: NUCLEAR MEDICINE PERFUSION LUNG SCAN TECHNIQUE: Perfusion images were obtained in multiple projections after intravenous injection of radiopharmaceutical. Ventilation scans intentionally deferred if perfusion scan and chest x-ray adequate for interpretation during COVID 19 epidemic. RADIOPHARMACEUTICALS:  4.3 mCi Tc-46m MAA IV COMPARISON:  Chest radiograph 07/15/2020 FINDINGS: Normal perfusion lung scan. No perfusion defects identified. IMPRESSION: Normal  perfusion lung scan. Electronically Signed   By: Ulyses Southward M.D.   On: 07/15/2020 15:54   US RENAL  Result Date: 07/14/2020 CLINICAL DATA:  Acute renal injury EXAM: RENAL / URINARY TRACT ULTRASOUND COMPLETE COMPARISON:  None. FINDINGS: Right Kidney: Renal measurements: 9.4 x 4.0 x 4.3 cm. = volume: 83 mL. Echogenicity within normal limits. No mass or hydronephrosis visualized. Left Kidney: Renal measurements: 10.8 x 5.2 x 4.2 cm. = volume: 124 mL. Echogenicity within normal limits. No mass or hydronephrosis visualized. Bladder: Appears normal for degree of bladder distention. Other: None. IMPRESSION: No acute abnormality noted. Electronically Signed   By: Alcide Clever M.D.   On: 07/14/2020 21:15   DG Chest Port 1 View  Result Date: 07/15/2020 CLINICAL DATA:  Shortness of breath EXAM: PORTABLE CHEST 1 VIEW COMPARISON:  07/14/2020 FINDINGS: Numerous leads and wires project over the chest. Midline trachea. Normal heart size. Atherosclerosis in the transverse aorta. No pleural effusion or pneumothorax. Clear lungs. IMPRESSION: No acute cardiopulmonary disease. Aortic Atherosclerosis (ICD10-I70.0). Electronically Signed   By: Jeronimo Greaves M.D.   On: 07/15/2020 09:08   DG Chest Portable 1 View  Result Date: 07/14/2020 CLINICAL DATA:  Dyspnea, COVID pneumonia EXAM: PORTABLE CHEST 1 VIEW COMPARISON:  07/05/2020 FINDINGS: The heart size and mediastinal contours are within normal limits. Both lungs are clear. The visualized skeletal structures are unremarkable. IMPRESSION: No active disease. Electronically Signed   By: Helyn Numbers MD   On: 07/14/2020 10:43   DG Foot 2 Views Left  Result Date: 07/18/2020 CLINICAL DATA:  Left foot pain without known injury. EXAM: LEFT FOOT - 2 VIEW COMPARISON:  None. FINDINGS: There is no evidence of fracture or dislocation. There is no evidence of arthropathy or other focal bone abnormality. Soft tissues are unremarkable. IMPRESSION: Negative. Electronically Signed   By: Lupita Raider M.D.   On: 07/18/2020 10:11   VAS [name] LOWER EXTREMITY VENOUS (DVT)  Result Date: 07/15/2020  Lower Venous DVT Study Indications: Edema.  Comparison Study: no prior Performing Technologist: Blanch Media RVS  Examination Guidelines: A complete evaluation includes B-mode imaging, spectral Doppler, color Doppler, and power Doppler as needed of all accessible portions of each vessel. Bilateral testing is considered an integral part of a complete examination. Limited examinations for reoccurring indications may be performed as noted. The reflux portion of the exam is performed with the patient in reverse Trendelenburg.  +---------+---------------+---------+-----------+----------+--------------+ RIGHT    CompressibilityPhasicitySpontaneityPropertiesThrombus Aging +---------+---------------+---------+-----------+----------+--------------+ CFV      Full           Yes      Yes                                 +---------+---------------+---------+-----------+----------+--------------+ SFJ      Full                                                        +---------+---------------+---------+-----------+----------+--------------+  FV Prox  Full                                                        +---------+---------------+---------+-----------+----------+--------------+ FV Mid   Full                                                        +---------+---------------+---------+-----------+----------+--------------+ FV DistalFull                                                        +---------+---------------+---------+-----------+----------+--------------+ PFV      Full                                                        +---------+---------------+---------+-----------+----------+--------------+ POP      Full           Yes      Yes                                 +---------+---------------+---------+-----------+----------+--------------+ PTV      Full                                                         +---------+---------------+---------+-----------+----------+--------------+ PERO     Full                                                        +---------+---------------+---------+-----------+----------+--------------+   +---------+---------------+---------+-----------+----------+--------------+ LEFT     CompressibilityPhasicitySpontaneityPropertiesThrombus Aging +---------+---------------+---------+-----------+----------+--------------+ CFV      Full           Yes      Yes                                 +---------+---------------+---------+-----------+----------+--------------+ SFJ      Full                                                        +---------+---------------+---------+-----------+----------+--------------+ FV Prox  Full                                                        +---------+---------------+---------+-----------+----------+--------------+  FV Mid   Full                                                        +---------+---------------+---------+-----------+----------+--------------+ FV DistalFull                                                        +---------+---------------+---------+-----------+----------+--------------+ PFV      Full                                                        +---------+---------------+---------+-----------+----------+--------------+ POP      Full           Yes      Yes                                 +---------+---------------+---------+-----------+----------+--------------+ PTV      Full                                                        +---------+---------------+---------+-----------+----------+--------------+ PERO     Full                                                        +---------+---------------+---------+-----------+----------+--------------+     Summary: BILATERAL: - No evidence of deep vein thrombosis seen in  the lower extremities, bilaterally. - No evidence of superficial venous thrombosis in the lower extremities, bilaterally. -No evidence of popliteal cyst, bilaterally.   *See table(s) above for measurements and observations. Electronically signed by Heath Larkhomas Hawken on 07/15/2020 at 3:49:54 PM.    Final

## 2020-07-24 DIAGNOSIS — M79672 Pain in left foot: Secondary | ICD-10-CM | POA: Diagnosis not present

## 2020-07-24 DIAGNOSIS — N179 Acute kidney failure, unspecified: Secondary | ICD-10-CM | POA: Diagnosis not present

## 2020-07-24 LAB — CBC WITH DIFFERENTIAL/PLATELET
Abs Immature Granulocytes: 1.35 10*3/uL — ABNORMAL HIGH (ref 0.00–0.07)
Basophils Absolute: 0.1 10*3/uL (ref 0.0–0.1)
Basophils Relative: 0 %
Eosinophils Absolute: 0.1 10*3/uL (ref 0.0–0.5)
Eosinophils Relative: 0 %
HCT: 35.2 % — ABNORMAL LOW (ref 36.0–46.0)
Hemoglobin: 11 g/dL — ABNORMAL LOW (ref 12.0–15.0)
Immature Granulocytes: 7 %
Lymphocytes Relative: 22 %
Lymphs Abs: 4.4 10*3/uL — ABNORMAL HIGH (ref 0.7–4.0)
MCH: 29.1 pg (ref 26.0–34.0)
MCHC: 31.3 g/dL (ref 30.0–36.0)
MCV: 93.1 fL (ref 80.0–100.0)
Monocytes Absolute: 1.8 10*3/uL — ABNORMAL HIGH (ref 0.1–1.0)
Monocytes Relative: 9 %
Neutro Abs: 12.6 10*3/uL — ABNORMAL HIGH (ref 1.7–7.7)
Neutrophils Relative %: 62 %
Platelets: 289 10*3/uL (ref 150–400)
RBC: 3.78 MIL/uL — ABNORMAL LOW (ref 3.87–5.11)
RDW: 14.8 % (ref 11.5–15.5)
WBC: 20.3 10*3/uL — ABNORMAL HIGH (ref 4.0–10.5)
nRBC: 0.2 % (ref 0.0–0.2)

## 2020-07-24 LAB — COMPREHENSIVE METABOLIC PANEL
ALT: 54 U/L — ABNORMAL HIGH (ref 0–44)
AST: 40 U/L (ref 15–41)
Albumin: 2.6 g/dL — ABNORMAL LOW (ref 3.5–5.0)
Alkaline Phosphatase: 39 U/L (ref 38–126)
Anion gap: 9 (ref 5–15)
BUN: 29 mg/dL — ABNORMAL HIGH (ref 6–20)
CO2: 26 mmol/L (ref 22–32)
Calcium: 8.6 mg/dL — ABNORMAL LOW (ref 8.9–10.3)
Chloride: 102 mmol/L (ref 98–111)
Creatinine, Ser: 1.22 mg/dL — ABNORMAL HIGH (ref 0.44–1.00)
GFR, Estimated: 53 mL/min — ABNORMAL LOW (ref >60)
Glucose, Bld: 81 mg/dL (ref 70–99)
Potassium: 4.8 mmol/L (ref 3.5–5.1)
Sodium: 137 mmol/L (ref 135–145)
Total Bilirubin: 1.1 mg/dL (ref 0.3–1.2)
Total Protein: 5.2 g/dL — ABNORMAL LOW (ref 6.5–8.1)

## 2020-07-24 NOTE — Progress Notes (Signed)
PROGRESS NOTE                                                                                                                                                                                                             Patient Demographics:    Shirley Hansen, is a 55 y.o. female, DOB - 1966-06-07, ZOX:[id number]  Outpatient Primary MD for the patient is Pcp, No    LOS - 10  Admit date - 07/14/2020    Chief Complaint  Patient presents with  . Back Pain  . Weakness    Covid        Brief Narrative (HPI from H&P)   - Shirley RothmanDetra Clelia CroftShaw is a 55 y.o. female with medical history significant of CVA with left-sided deficits; HTN; and stage 3a CKD presenting with worsening COVID symptoms, positive on 1/24.  She was seen in the ER on 1/24 for progressive and generalized weakness, she had no shortness of breath her main symptom was generalized weakness, in the ER her work-up was consistent with severe dehydration she was found to have extremely high D-dimer, AKI, hypotension and she was admitted   Subjective:   Patient eating in a chair, reports her left feet pain Significantly subsided, denies any chest pain, shortness of breath or fever.     Assessment  & Plan :    Acute COVID-19 infection  - she has received 1 shot of Pfizer vaccine recently, she has minimal pulmonary involvement and symptoms are more generalized with severe dehydration and AKI.  CRP is borderline but her D-dimer is extremely elevated.  She was treated with IV fluids, low-dose steroids and Remdesivir.  This problem has clinically resolved she is symptom-free on room air.  Encouraged the patient to sit up in chair in the daytime use I-S and flutter valve for pulmonary toiletry and then prone in bed when at night.  Will advance activity and titrate down oxygen as possible. -Leukocytosis in the setting of steroids, procalcitonin is reassuring.   SpO2: 99 %  Recent Labs  Lab  07/18/20 0335 07/19/20 0509 07/21/20 0105 07/22/20 0144 07/23/20 0131 07/24/20 0136  WBC  --   --  31.2* 26.5* 31.6* 20.3*  HGB  --   --  11.4* 11.9* 11.7* 11.0*  HCT  --   --  33.1* 35.2* 36.1 35.2*  PLT  --   --  436* 384 356 289  CRP 1.1* 0.8 0.7 0.7  --   --   BNP  --   --  69.8 96.5  --   --   DDIMER 3.65* 2.63* 1.66* 1.35*  --   --   PROCALCITON  --   --  <0.10  --  <0.10  --   AST  --   --  42* 39 32 40  ALT  --   --  64* 66* 58* 54*  ALKPHOS  --   --  46 51 45 39  BILITOT  --   --  0.5 0.6 0.5 1.1  ALBUMIN  --   --  2.6* 2.7* 2.7* 2.6*    Dehydration with AKI on CKD stage IIIa -Resolved with IV fluids.  Transaminitis -Due to Covid, trending down.  History of CVA with left-sided hemiparesis.   -Supportive care.  Continue aspirin statin for secondary prevention, PT OT as needed.  High D-dimer - leg ultrasound & VQ negative .  Trending down  Obesity with BMI 32.  Follow with PCP for weight loss.  UTI.  -Finished  3 days of Rocephin.  Hypomagnesemia. Replaced on 07/17/2020.  Acute gout affecting both feet.  X-ray unremarkable, uric acid level elevated,  -Require as needed steroids and indomethacin, improving with colchicine.    leukocytocis -In the setting of steroids, procalcitonin within normal limit, she is nontoxic, afebrile      Condition -   Guarded  Family Communication  : Patient is coherent, I have discussed the case in details with her, answered all her questions, thank you.  Code Status :  Full  Consults  :  PCCM  Procedures  :    Leg [name]  -ve  VQ Scan  -ve  PUD Prophylaxis : PPI  Disposition Plan  :    Status is: Inpatient  Remains inpatient appropriate because:IV treatments appropriate due to intensity of illness or inability to take PO   Dispo: The patient is from: Home              Anticipated d/c is to: SNF              Anticipated d/c date is: 1 day              Patient currently is  medically stable to d/c.   Difficult to  place patient Yes   DVT Prophylaxis  :  Hep gtt >> Lovenox  Lab Results  Component Value Date   PLT 289 07/24/2020    Diet :  Diet Order            Diet regular Room service appropriate? Yes; Fluid consistency: Thin  Diet effective now                  Inpatient Medications  Scheduled Meds: . amLODipine  10 mg Oral Daily  . vitamin C  500 mg Oral Daily  . aspirin EC  81 mg Oral Daily  . atorvastatin  40 mg Oral QHS  . brimonidine  1 drop Both Eyes BID  . colchicine  0.6 mg Oral Daily  . dorzolamide-timolol  1 drop Both Eyes BID  . enoxaparin (LOVENOX) injection  40 mg Subcutaneous Daily  . feeding supplement  237 mL Oral BID BM  . gabapentin  300 mg Oral BID  . hydrocortisone cream   Topical BID  . latanoprost  1 drop Both Eyes QHS  . loratadine  10 mg Oral Daily  .  multivitamin with minerals  1 tablet Oral Daily  . pantoprazole  40 mg Oral Daily  . zinc sulfate  220 mg Oral Daily   Continuous Infusions:  PRN Meds:.acetaminophen **OR** [DISCONTINUED] acetaminophen, albuterol, calcium carbonate (dosed in mg elemental calcium), docusate sodium, feeding supplement (NEPRO CARB STEADY), guaiFENesin-dextromethorphan, hydrALAZINE, [DISCONTINUED] ondansetron **OR** ondansetron (ZOFRAN) IV, polyethylene glycol, sorbitol, zolpidem  Antibiotics  :    Anti-infectives (From admission, onward)   Start     Dose/Rate Route Frequency Ordered Stop   07/15/20 1215  cefTRIAXone (ROCEPHIN) 1 g in sodium chloride 0.9 % 100 mL IVPB        1 g 200 mL/hr over 30 Minutes Intravenous Every 24 hours 07/15/20 1116 07/17/20 2005   07/15/20 1000  remdesivir 100 mg in sodium chloride 0.9 % 100 mL IVPB  Status:  Discontinued       "Followed by" Linked Group Details   100 mg 200 mL/hr over 30 Minutes Intravenous Daily 07/14/20 1449 07/14/20 1451   07/15/20 1000  remdesivir 100 mg in sodium chloride 0.9 % 100 mL IVPB       "Followed by" Linked Group Details   100 mg 200 mL/hr over 30 Minutes  Intravenous Daily 07/14/20 1451 07/18/20 0942   07/14/20 1600  remdesivir 200 mg in sodium chloride 0.9% 250 mL IVPB       "Followed by" Linked Group Details   200 mg 580 mL/hr over 30 Minutes Intravenous Once 07/14/20 1451 07/14/20 1900   07/14/20 1500  remdesivir 200 mg in sodium chloride 0.9% 250 mL IVPB  Status:  Discontinued       "Followed by" Linked Group Details   200 mg 580 mL/hr over 30 Minutes Intravenous Once 07/14/20 1449 07/14/20 1451       Nishant Schrecengost M.D on 07/24/2020 at 1:07 PM  To page go to [email or web address]   [facility] -  Office  [phone]  See all Orders from today for further details    Objective:   Vitals:   07/23/20 2122 07/24/20 0554 07/24/20 0753 07/24/20 1146  BP: (!) 130/92 104/78 104/72 119/89  Pulse: 98 100 100 (!) 108  Resp: 18 20 20 20   Temp: 98.7 F (37.1 C) 98.2 F (36.8 C) 98.5 F (36.9 C) 98.5 F (36.9 C)  TempSrc: Oral Oral Oral Oral  SpO2: 97% 99% 99% 99%  Weight:      Height:        Wt Readings from Last 3 Encounters:  07/14/20 79.4 kg     Intake/Output Summary (Last 24 hours) at 07/24/2020 1307 Last data filed at 07/24/2020 0900 Gross per 24 hour  Intake 120 ml  Output -  Net 120 ml     Physical Exam  Awake Alert, Oriented X 3, No new F.N deficits, Normal affect Symmetrical Chest wall movement, Good air movement bilaterally, CTAB RRR,No Gallops,Rubs or new Murmurs, No Parasternal Heave +ve B.Sounds, Abd Soft, No tenderness, No rebound - guarding or rigidity. No Cyanosis, Clubbing or edema, No new Rash or bruise        Data Review:    CBC Recent Labs  Lab 07/21/20 0105 07/22/20 0144 07/23/20 0131 07/24/20 0136  WBC 31.2* 26.5* 31.6* 20.3*  HGB 11.4* 11.9* 11.7* 11.0*  HCT 33.1* 35.2* 36.1 35.2*  PLT 436* 384 356 289  MCV 88.5 89.8 89.8 93.1  MCH 30.5 30.4 29.1 29.1  MCHC 34.4 33.8 32.4 31.3  RDW 13.7 13.7 13.8 14.8  LYMPHSABS 3.7 3.1 2.9 4.4*  MONOABS  2.8* 1.0 2.2* 1.8*  EOSABS 0.0  0.0 0.0 0.1  BASOSABS 0.0 0.1 0.1 0.1    Recent Labs  Lab 07/18/20 0335 07/19/20 0509 07/21/20 0105 07/22/20 0144 07/23/20 0131 07/24/20 0136  NA  --  140 137 136 136 137  K  --  4.3 4.6 4.4 4.4 4.8  CL  --  104 99 98 99 102  CO2  --  GLUCOSE  --  147* 126* 150* 126* 81  BUN  --  28* 32* 32* 28* 29*  CREATININE  --  1.45* 1.00 1.21* 1.02* 1.22*  CALCIUM  --  8.9 8.8* 8.8* 8.7* 8.6*  AST  --   --  42* 39 32 40  ALT  --   --  64* 66* 58* 54*  ALKPHOS  --   --  46 51 45 39  BILITOT  --   --  0.5 0.6 0.5 1.1  ALBUMIN  --   --  2.6* 2.7* 2.7* 2.6*  MG 1.9 1.9 1.9 2.2  --   --   CRP 1.1* 0.8 0.7 0.7  --   --   DDIMER 3.65* 2.63* 1.66* 1.35*  --   --   PROCALCITON  --   --  <0.10  --  <0.10  --   BNP  --   --  69.8 96.5  --   --     ------------------------------------------------------------------------------------------------------------------ No results for input(s): CHOL, HDL, LDLCALC, TRIG, CHOLHDL, LDLDIRECT in the last 72 hours.  No results found for: HGBA1C ------------------------------------------------------------------------------------------------------------------ No results for input(s): TSH, T4TOTAL, T3FREE, THYROIDAB in the last 72 hours.  Invalid input(s): FREET3  Cardiac Enzymes No results for input(s): CKMB, TROPONINI, MYOGLOBIN in the last 168 hours.  Invalid input(s): CK ------------------------------------------------------------------------------------------------------------------    Component Value Date/Time   BNP 96.5 07/22/2020 0144    Micro Results Recent Results (from the past 240 hour(s))  Blood Culture (routine x 2)     Status: None   Collection Time: 07/14/20  4:43 PM   Specimen: BLOOD RIGHT HAND  Result Value Ref Range Status   Specimen Description BLOOD RIGHT HAND  Final   Special Requests   Final    BOTTLES DRAWN AEROBIC ONLY Blood Culture results may not be optimal due to an inadequate volume of blood received in  culture bottles   Culture   Final    NO GROWTH 5 DAYS Performed at [facility], [address] [place], [place] [address]    Report Status 07/19/2020 FINAL  Final  Culture, Urine     Status: None   Collection Time: 07/14/20  5:52 PM   Specimen: Urine, Random  Result Value Ref Range Status   Specimen Description URINE, RANDOM  Final   Special Requests NONE  Final   Culture   Final    NO GROWTH Performed at [facility], [address] [place], [place] [address]    Report Status 07/16/2020 FINAL  Final  MRSA PCR Screening     Status: None   Collection Time: 07/15/20  5:32 AM   Specimen: Nasal Mucosa; Nasopharyngeal  Result Value Ref Range Status   MRSA by PCR NEGATIVE NEGATIVE Final    Comment:        The GeneXpert MRSA Assay (FDA approved for NASAL specimens only), is one component of a comprehensive MRSA colonization surveillance program. It is not intended to diagnose MRSA infection nor to guide or monitor treatment for  MRSA infections. Performed at [facility], [address], [place], [place] [address]     Radiology Reports DG Chest 2 View  Result Date: 07/05/2020 CLINICAL DATA:  Shortness of breath and fatigue EXAM: CHEST - 2 VIEW COMPARISON:  None. FINDINGS: Indistinct densities at the lung bases on the frontal view. No edema, effusion, or pneumothorax. Normal heart size and mediastinal contours. IMPRESSION: Mild infiltrate or atelectasis at the lung bases, lateral view and low volumes favoring atelectasis. Electronically Signed   By: Marnee Spring M.D.   On: 07/05/2020 10:26   CT Head Wo Contrast  Result Date: 07/05/2020 CLINICAL DATA:  Worsening left-sided weakness (history of CVA) also with history of nontraumatic ICH. EXAM: CT HEAD WITHOUT CONTRAST TECHNIQUE: Contiguous axial images were obtained from the base of the skull through the vertex without intravenous contrast. COMPARISON:  No pertinent prior exams available for comparison.  FINDINGS: Brain: Cerebral volume is normal for age. There is a subcentimeter hypodensity within the right thalamocapsular junction with subtle peripheral curvilinear hyperdensity (for instance as seen on series 3, images 15-17). There are chronic lacunar infarcts within the left corona radiata and left pons. No demarcated cortical infarct. No extra-axial fluid collection. No evidence of intracranial mass. No midline shift. Vascular: No hyperdense vessel.  Atherosclerotic calcifications Skull: Normal. Negative for fracture or focal lesion. Sinuses/Orbits: Visualized orbits show no acute finding. No significant paranasal sinus disease at the imaged levels. These results were called by telephone at the time of interpretation on 07/05/2020 at 11:42 am to provider [facility] , who verbally acknowledged these results. IMPRESSION: Subcentimeter hypodensity within the right thalamocapsular junction with subtle peripheral curvilinear hyperdensity. This is favored to reflect subacute/chronic blood products at site of a subacute/chronic lacunar infarct. However, correlation with the patient's history and any available prior outside imaging is necessary. If this does not correlate with the patient's history and outside imaging, a contrast-enhanced brain MRI is recommended for further evaluation. Chronic lacunar infarcts within the left corona radiata and left basal ganglia. Electronically Signed   By: Jackey Loge DO   On: 07/05/2020 11:43   MR BRAIN WO CONTRAST  Result Date: 07/05/2020 CLINICAL DATA:  Neuro deficit, acute, stroke suspected History of thalamic ICH EXAM: MRI HEAD WITHOUT CONTRAST TECHNIQUE: Multiplanar, multiecho pulse sequences of the brain and surrounding structures were obtained without intravenous contrast. COMPARISON:  07/05/2020. FINDINGS: Brain: No focal restricted diffusion. Remote microhemorrhages involving the left thalamus, cerebellum and left basal ganglia. Sequela of remote insult involving  the right thalamus with associated hemosiderin deposition. No midline shift, ventriculomegaly or extra-axial fluid collection. No mass lesion. Chronic left corona radiata and pontine lacunar insults. Minimal chronic microvascular ischemic changes. Vascular: Normal flow voids. Skull and upper cervical spine: Normal marrow signal. Sinuses/Orbits: Normal orbits. Pneumatized paranasal sinuses. Bilateral mastoid free fluid. Other: None. IMPRESSION: Sequela of prior right thalamic hemorrhage. No acute intracranial hemorrhage or infarct. Remote insults involving the left corona radiata and pons. Sequela of hypertensive microangiopathy. Electronically Signed   By: Stana Bunting M.D.   On: 07/05/2020 16:14   NM Pulmonary Perfusion  Result Date: 07/15/2020 CLINICAL DATA:  Elevated D-dimer, high risk of pulmonary embolism due to recent COVID infection EXAM: NUCLEAR MEDICINE PERFUSION LUNG SCAN TECHNIQUE: Perfusion images were obtained in multiple projections after intravenous injection of radiopharmaceutical. Ventilation scans intentionally deferred if perfusion scan and chest x-ray adequate for interpretation during COVID 19 epidemic. RADIOPHARMACEUTICALS:  4.3 mCi Tc-74m MAA IV COMPARISON:  Chest radiograph 07/15/2020 FINDINGS: Normal  perfusion lung scan. No perfusion defects identified. IMPRESSION: Normal perfusion lung scan. Electronically Signed   By: Ulyses Southward M.D.   On: 07/15/2020 15:54   US RENAL  Result Date: 07/14/2020 CLINICAL DATA:  Acute renal injury EXAM: RENAL / URINARY TRACT ULTRASOUND COMPLETE COMPARISON:  None. FINDINGS: Right Kidney: Renal measurements: 9.4 x 4.0 x 4.3 cm. = volume: 83 mL. Echogenicity within normal limits. No mass or hydronephrosis visualized. Left Kidney: Renal measurements: 10.8 x 5.2 x 4.2 cm. = volume: 124 mL. Echogenicity within normal limits. No mass or hydronephrosis visualized. Bladder: Appears normal for degree of bladder distention. Other: None. IMPRESSION: No acute  abnormality noted. Electronically Signed   By: Alcide Clever M.D.   On: 07/14/2020 21:15   DG Chest Port 1 View  Result Date: 07/15/2020 CLINICAL DATA:  Shortness of breath EXAM: PORTABLE CHEST 1 VIEW COMPARISON:  07/14/2020 FINDINGS: Numerous leads and wires project over the chest. Midline trachea. Normal heart size. Atherosclerosis in the transverse aorta. No pleural effusion or pneumothorax. Clear lungs. IMPRESSION: No acute cardiopulmonary disease. Aortic Atherosclerosis (ICD10-I70.0). Electronically Signed   By: Jeronimo Greaves M.D.   On: 07/15/2020 09:08   DG Chest Portable 1 View  Result Date: 07/14/2020 CLINICAL DATA:  Dyspnea, COVID pneumonia EXAM: PORTABLE CHEST 1 VIEW COMPARISON:  07/05/2020 FINDINGS: The heart size and mediastinal contours are within normal limits. Both lungs are clear. The visualized skeletal structures are unremarkable. IMPRESSION: No active disease. Electronically Signed   By: Helyn Numbers MD   On: 07/14/2020 10:43   DG Foot 2 Views Left  Result Date: 07/18/2020 CLINICAL DATA:  Left foot pain without known injury. EXAM: LEFT FOOT - 2 VIEW COMPARISON:  None. FINDINGS: There is no evidence of fracture or dislocation. There is no evidence of arthropathy or other focal bone abnormality. Soft tissues are unremarkable. IMPRESSION: Negative. Electronically Signed   By: Lupita Raider M.D.   On: 07/18/2020 10:11   VAS [name] LOWER EXTREMITY VENOUS (DVT)  Result Date: 07/15/2020  Lower Venous DVT Study Indications: Edema.  Comparison Study: no prior Performing Technologist: Blanch Media RVS  Examination Guidelines: A complete evaluation includes B-mode imaging, spectral Doppler, color Doppler, and power Doppler as needed of all accessible portions of each vessel. Bilateral testing is considered an integral part of a complete examination. Limited examinations for reoccurring indications may be performed as noted. The reflux portion of the exam is performed with the patient in reverse  Trendelenburg.  +---------+---------------+---------+-----------+----------+--------------+ RIGHT    CompressibilityPhasicitySpontaneityPropertiesThrombus Aging +---------+---------------+---------+-----------+----------+--------------+ CFV      Full           Yes      Yes                                 +---------+---------------+---------+-----------+----------+--------------+ SFJ      Full                                                        +---------+---------------+---------+-----------+----------+--------------+ FV Prox  Full                                                        +---------+---------------+---------+-----------+----------+--------------+  FV Mid   Full                                                        +---------+---------------+---------+-----------+----------+--------------+ FV DistalFull                                                        +---------+---------------+---------+-----------+----------+--------------+ PFV      Full                                                        +---------+---------------+---------+-----------+----------+--------------+ POP      Full           Yes      Yes                                 +---------+---------------+---------+-----------+----------+--------------+ PTV      Full                                                        +---------+---------------+---------+-----------+----------+--------------+ PERO     Full                                                        +---------+---------------+---------+-----------+----------+--------------+   +---------+---------------+---------+-----------+----------+--------------+ LEFT     CompressibilityPhasicitySpontaneityPropertiesThrombus Aging +---------+---------------+---------+-----------+----------+--------------+ CFV      Full           Yes      Yes                                  +---------+---------------+---------+-----------+----------+--------------+ SFJ      Full                                                        +---------+---------------+---------+-----------+----------+--------------+ FV Prox  Full                                                        +---------+---------------+---------+-----------+----------+--------------+ FV Mid   Full                                                        +---------+---------------+---------+-----------+----------+--------------+  FV DistalFull                                                        +---------+---------------+---------+-----------+----------+--------------+ PFV      Full                                                        +---------+---------------+---------+-----------+----------+--------------+ POP      Full           Yes      Yes                                 +---------+---------------+---------+-----------+----------+--------------+ PTV      Full                                                        +---------+---------------+---------+-----------+----------+--------------+ PERO     Full                                                        +---------+---------------+---------+-----------+----------+--------------+     Summary: BILATERAL: - No evidence of deep vein thrombosis seen in the lower extremities, bilaterally. - No evidence of superficial venous thrombosis in the lower extremities, bilaterally. -No evidence of popliteal cyst, bilaterally.   *See table(s) above for measurements and observations. Electronically signed by Heath Lark on 07/15/2020 at 3:49:54 PM.    Final

## 2020-07-25 DIAGNOSIS — U071 COVID-19: Secondary | ICD-10-CM | POA: Diagnosis not present

## 2020-07-25 NOTE — Progress Notes (Signed)
PROGRESS NOTE                                                                                                                                                                                                             Patient Demographics:    Shirley Hansen, is a 55 y.o. female, DOB - [birth year], VZC:[id number]  Outpatient Primary MD for the patient is Pcp, No    LOS - 11  Admit date - 07/14/2020    Chief Complaint  Patient presents with  . Back Pain  . Weakness    Covid        Brief Narrative (HPI from H&P)   - Shirley Hansen is a 55 y.o. female with medical history significant of CVA with left-sided deficits; HTN; and stage 3a CKD presenting with worsening COVID symptoms, positive on 1/24.  She was seen in the ER on 1/24 for progressive and generalized weakness, she had no shortness of breath her main symptom was generalized weakness, in the ER her work-up was consistent with severe dehydration she was found to have extremely high D-dimer, AKI, hypotension and she was admitted   Subjective:   Patient denies any complaints today, reports her left feet pain has significantly subsided, no chest pain or shortness of breath.    Assessment  & Plan :    Acute COVID-19 infection  - she has received 1 shot of Pfizer vaccine recently, she has minimal pulmonary involvement and symptoms are more generalized with severe dehydration and AKI.  CRP is borderline but her D-dimer is extremely elevated.  She was treated with IV fluids, low-dose steroids and Remdesivir.  This problem has clinically resolved she is symptom-free on room air.  Encouraged the patient to sit up in chair in the daytime use I-S and flutter valve for pulmonary toiletry and then prone in bed when at night.  Will advance activity and titrate down oxygen as possible. -Leukocytosis in the setting of steroids, procalcitonin is reassuring. -Patient can come off isolation on  2/15   SpO2: 100 %  Recent Labs  Lab 07/19/20 0509 07/21/20 0105 07/22/20 0144 07/23/20 0131 07/24/20 0136  WBC  --  31.2* 26.5* 31.6* 20.3*  HGB  --  11.4* 11.9* 11.7* 11.0*  HCT  --  33.1* 35.2* 36.1 35.2*  PLT  --  436* 384 356 289  CRP 0.8 0.7 0.7  --   --   BNP  --  69.8 96.5  --   --   DDIMER 2.63* 1.66* 1.35*  --   --   PROCALCITON  --  <0.10  --  <0.10  --   AST  --  42* 39 32 40  ALT  --  64* 66* 58* 54*  ALKPHOS  --  46 51 45 39  BILITOT  --  0.5 0.6 0.5 1.1  ALBUMIN  --  2.6* 2.7* 2.7* 2.6*    Dehydration with AKI on CKD stage IIIa -Resolved with IV fluids.  Transaminitis -Due to Covid, trending down.  History of CVA with left-sided hemiparesis.   -Supportive care.  Continue aspirin statin for secondary prevention, PT OT as needed.  High D-dimer - leg ultrasound & VQ negative .  Trending down  Obesity with BMI 32.  Follow with PCP for weight loss.  UTI.  -Finished  3 days of Rocephin.  Hypomagnesemia. Replaced on 07/17/2020.  Acute gout affecting both feet.  X-ray unremarkable, uric acid level elevated,  -Require as needed steroids and indomethacin, improving with colchicine.    leukocytocis -In the setting of steroids, procalcitonin within normal limit, she is nontoxic, afebrile      Condition -   Guarded  Family Communication  : Patient is coherent, I have discussed the case in details with her, answered all her questions, thank you.  Code Status :  Full  Consults  :  PCCM  Procedures  :    Leg [name]  -ve  VQ Scan  -ve  PUD Prophylaxis : PPI  Disposition Plan  :    Status is: Inpatient  Remains inpatient appropriate because:IV treatments appropriate due to intensity of illness or inability to take PO   Dispo: The patient is from: Home              Anticipated d/c is to: SNF              Anticipated d/c date is: 1 day              Patient currently is  medically stable to d/c. Patient is medically stable for discharge, awaiting SNF  bed availability.   Difficult to place patient Yes   DVT Prophylaxis  :  Hep gtt >> Lovenox  Lab Results  Component Value Date   PLT 289 07/24/2020    Diet :  Diet Order            Diet regular Room service appropriate? Yes; Fluid consistency: Thin  Diet effective now                  Inpatient Medications  Scheduled Meds: . amLODipine  10 mg Oral Daily  . vitamin C  500 mg Oral Daily  . aspirin EC  81 mg Oral Daily  . atorvastatin  40 mg Oral QHS  . brimonidine  1 drop Both Eyes BID  . colchicine  0.6 mg Oral Daily  . dorzolamide-timolol  1 drop Both Eyes BID  . enoxaparin (LOVENOX) injection  40 mg Subcutaneous Daily  . feeding supplement  237 mL Oral BID BM  . gabapentin  300 mg Oral BID  . hydrocortisone cream   Topical BID  . latanoprost  1 drop Both Eyes QHS  . loratadine  10 mg Oral Daily  . multivitamin with minerals  1 tablet Oral Daily  . pantoprazole  40 mg Oral Daily  .  zinc sulfate  220 mg Oral Daily   Continuous Infusions:  PRN Meds:.acetaminophen **OR** [DISCONTINUED] acetaminophen, albuterol, calcium carbonate (dosed in mg elemental calcium), docusate sodium, feeding supplement (NEPRO CARB STEADY), guaiFENesin-dextromethorphan, hydrALAZINE, [DISCONTINUED] ondansetron **OR** ondansetron (ZOFRAN) IV, polyethylene glycol, sorbitol, zolpidem  Antibiotics  :    Anti-infectives (From admission, onward)   Start     Dose/Rate Route Frequency Ordered Stop   07/15/20 1215  cefTRIAXone (ROCEPHIN) 1 g in sodium chloride 0.9 % 100 mL IVPB        1 g 200 mL/hr over 30 Minutes Intravenous Every 24 hours 07/15/20 1116 07/17/20 2005   07/15/20 1000  remdesivir 100 mg in sodium chloride 0.9 % 100 mL IVPB  Status:  Discontinued       "Followed by" Linked Group Details   100 mg 200 mL/hr over 30 Minutes Intravenous Daily 07/14/20 1449 07/14/20 1451   07/15/20 1000  remdesivir 100 mg in sodium chloride 0.9 % 100 mL IVPB       "Followed by" Linked Group Details    100 mg 200 mL/hr over 30 Minutes Intravenous Daily 07/14/20 1451 07/18/20 0942   07/14/20 1600  remdesivir 200 mg in sodium chloride 0.9% 250 mL IVPB       "Followed by" Linked Group Details   200 mg 580 mL/hr over 30 Minutes Intravenous Once 07/14/20 1451 07/14/20 1900   07/14/20 1500  remdesivir 200 mg in sodium chloride 0.9% 250 mL IVPB  Status:  Discontinued       "Followed by" Linked Group Details   200 mg 580 mL/hr over 30 Minutes Intravenous Once 07/14/20 1449 07/14/20 1451       Khailee Mick M.D on 07/25/2020 at 1:12 PM  To page go to [email or web address]   [facility] -  Office  [phone]  See all Orders from today for further details    Objective:   Vitals:   07/24/20 1618 07/24/20 2107 07/25/20 0532 07/25/20 1227  BP: 122/79 106/77 132/88 118/82  Pulse: (!) 101 (!) 102 100 (!) 101  Resp: 20 20 20 18   Temp: 98.5 F (36.9 C) 98.6 F (37 C) 98.2 F (36.8 C) 98.4 F (36.9 C)  TempSrc: Oral Oral Axillary Oral  SpO2: 98% 100% 100% 100%  Weight:      Height:        Wt Readings from Last 3 Encounters:  07/14/20 79.4 kg     Intake/Output Summary (Last 24 hours) at 07/25/2020 1312 Last data filed at 07/25/2020 0913 Gross per 24 hour  Intake 360 ml  Output 1400 ml  Net -1040 ml     Physical Exam  Awake Alert, Oriented X 3, Normal affect Symmetrical Chest wall movement, Good air movement bilaterally, CTAB RRR,No Gallops,Rubs or new Murmurs, No Parasternal Heave +ve B.Sounds, Abd Soft, No tenderness, No rebound - guarding or rigidity. No Cyanosis, Clubbing or edema, No new Rash or bruise         Data Review:    CBC Recent Labs  Lab 07/21/20 0105 07/22/20 0144 07/23/20 0131 07/24/20 0136  WBC 31.2* 26.5* 31.6* 20.3*  HGB 11.4* 11.9* 11.7* 11.0*  HCT 33.1* 35.2* 36.1 35.2*  PLT 436* 384 356 289  MCV 88.5 89.8 89.8 93.1  MCH 30.5 30.4 29.1 29.1  MCHC 34.4 33.8 32.4 31.3  RDW 13.7 13.7 13.8 14.8  LYMPHSABS 3.7 3.1 2.9 4.4*  MONOABS  2.8* 1.0 2.2* 1.8*  EOSABS 0.0 0.0 0.0 0.1  BASOSABS 0.0 0.1 0.1 0.1  Recent Labs  Lab 07/19/20 0509 07/21/20 0105 07/22/20 0144 07/23/20 0131 07/24/20 0136  NA 140 137 136 136 137  K 4.3 4.6 4.4 4.4 4.8  CL 104 99 98 99 102  CO2 GLUCOSE 147* 126* 150* 126* 81  BUN 28* 32* 32* 28* 29*  CREATININE 1.45* 1.00 1.21* 1.02* 1.22*  CALCIUM 8.9 8.8* 8.8* 8.7* 8.6*  AST  --  42* 39 32 40  ALT  --  64* 66* 58* 54*  ALKPHOS  --  46 51 45 39  BILITOT  --  0.5 0.6 0.5 1.1  ALBUMIN  --  2.6* 2.7* 2.7* 2.6*  MG 1.9 1.9 2.2  --   --   CRP 0.8 0.7 0.7  --   --   DDIMER 2.63* 1.66* 1.35*  --   --   PROCALCITON  --  <0.10  --  <0.10  --   BNP  --  69.8 96.5  --   --     ------------------------------------------------------------------------------------------------------------------ No results for input(s): CHOL, HDL, LDLCALC, TRIG, CHOLHDL, LDLDIRECT in the last 72 hours.  No results found for: HGBA1C ------------------------------------------------------------------------------------------------------------------ No results for input(s): TSH, T4TOTAL, T3FREE, THYROIDAB in the last 72 hours.  Invalid input(s): FREET3  Cardiac Enzymes No results for input(s): CKMB, TROPONINI, MYOGLOBIN in the last 168 hours.  Invalid input(s): CK ------------------------------------------------------------------------------------------------------------------    Component Value Date/Time   BNP 96.5 07/22/2020 0144    Micro Results No results found for this or any previous visit (from the past 240 hour(s)).  Radiology Reports DG Chest 2 View  Result Date: 07/05/2020 CLINICAL DATA:  Shortness of breath and fatigue EXAM: CHEST - 2 VIEW COMPARISON:  None. FINDINGS: Indistinct densities at the lung bases on the frontal view. No edema, effusion, or pneumothorax. Normal heart size and mediastinal contours. IMPRESSION: Mild infiltrate or atelectasis at the lung bases, lateral view and  low volumes favoring atelectasis. Electronically Signed   By: Marnee Spring M.D.   On: 07/05/2020 10:26   CT Head Wo Contrast  Result Date: 07/05/2020 CLINICAL DATA:  Worsening left-sided weakness (history of CVA) also with history of nontraumatic ICH. EXAM: CT HEAD WITHOUT CONTRAST TECHNIQUE: Contiguous axial images were obtained from the base of the skull through the vertex without intravenous contrast. COMPARISON:  No pertinent prior exams available for comparison. FINDINGS: Brain: Cerebral volume is normal for age. There is a subcentimeter hypodensity within the right thalamocapsular junction with subtle peripheral curvilinear hyperdensity (for instance as seen on series 3, images 15-17). There are chronic lacunar infarcts within the left corona radiata and left pons. No demarcated cortical infarct. No extra-axial fluid collection. No evidence of intracranial mass. No midline shift. Vascular: No hyperdense vessel.  Atherosclerotic calcifications Skull: Normal. Negative for fracture or focal lesion. Sinuses/Orbits: Visualized orbits show no acute finding. No significant paranasal sinus disease at the imaged levels. These results were called by telephone at the time of interpretation on 07/05/2020 at 11:42 am to provider [facility] , who verbally acknowledged these results. IMPRESSION: Subcentimeter hypodensity within the right thalamocapsular junction with subtle peripheral curvilinear hyperdensity. This is favored to reflect subacute/chronic blood products at site of a subacute/chronic lacunar infarct. However, correlation with the patient's history and any available prior outside imaging is necessary. If this does not correlate with the patient's history and outside imaging, a contrast-enhanced brain MRI is recommended for further evaluation. Chronic lacunar infarcts within the left corona radiata and left basal ganglia. Electronically Signed  By: Jackey Loge DO   On: 07/05/2020 11:43   MR BRAIN WO  CONTRAST  Result Date: 07/05/2020 CLINICAL DATA:  Neuro deficit, acute, stroke suspected History of thalamic ICH EXAM: MRI HEAD WITHOUT CONTRAST TECHNIQUE: Multiplanar, multiecho pulse sequences of the brain and surrounding structures were obtained without intravenous contrast. COMPARISON:  07/05/2020. FINDINGS: Brain: No focal restricted diffusion. Remote microhemorrhages involving the left thalamus, cerebellum and left basal ganglia. Sequela of remote insult involving the right thalamus with associated hemosiderin deposition. No midline shift, ventriculomegaly or extra-axial fluid collection. No mass lesion. Chronic left corona radiata and pontine lacunar insults. Minimal chronic microvascular ischemic changes. Vascular: Normal flow voids. Skull and upper cervical spine: Normal marrow signal. Sinuses/Orbits: Normal orbits. Pneumatized paranasal sinuses. Bilateral mastoid free fluid. Other: None. IMPRESSION: Sequela of prior right thalamic hemorrhage. No acute intracranial hemorrhage or infarct. Remote insults involving the left corona radiata and pons. Sequela of hypertensive microangiopathy. Electronically Signed   By: Stana Bunting M.D.   On: 07/05/2020 16:14   NM Pulmonary Perfusion  Result Date: 07/15/2020 CLINICAL DATA:  Elevated D-dimer, high risk of pulmonary embolism due to recent COVID infection EXAM: NUCLEAR MEDICINE PERFUSION LUNG SCAN TECHNIQUE: Perfusion images were obtained in multiple projections after intravenous injection of radiopharmaceutical. Ventilation scans intentionally deferred if perfusion scan and chest x-ray adequate for interpretation during COVID 19 epidemic. RADIOPHARMACEUTICALS:  4.3 mCi Tc-59m MAA IV COMPARISON:  Chest radiograph 07/15/2020 FINDINGS: Normal perfusion lung scan. No perfusion defects identified. IMPRESSION: Normal perfusion lung scan. Electronically Signed   By: Ulyses Southward M.D.   On: 07/15/2020 15:54   US RENAL  Result Date: 07/14/2020 CLINICAL DATA:   Acute renal injury EXAM: RENAL / URINARY TRACT ULTRASOUND COMPLETE COMPARISON:  None. FINDINGS: Right Kidney: Renal measurements: 9.4 x 4.0 x 4.3 cm. = volume: 83 mL. Echogenicity within normal limits. No mass or hydronephrosis visualized. Left Kidney: Renal measurements: 10.8 x 5.2 x 4.2 cm. = volume: 124 mL. Echogenicity within normal limits. No mass or hydronephrosis visualized. Bladder: Appears normal for degree of bladder distention. Other: None. IMPRESSION: No acute abnormality noted. Electronically Signed   By: Alcide Clever M.D.   On: 07/14/2020 21:15   DG Chest Port 1 View  Result Date: 07/15/2020 CLINICAL DATA:  Shortness of breath EXAM: PORTABLE CHEST 1 VIEW COMPARISON:  07/14/2020 FINDINGS: Numerous leads and wires project over the chest. Midline trachea. Normal heart size. Atherosclerosis in the transverse aorta. No pleural effusion or pneumothorax. Clear lungs. IMPRESSION: No acute cardiopulmonary disease. Aortic Atherosclerosis (ICD10-I70.0). Electronically Signed   By: Jeronimo Greaves M.D.   On: 07/15/2020 09:08   DG Chest Portable 1 View  Result Date: 07/14/2020 CLINICAL DATA:  Dyspnea, COVID pneumonia EXAM: PORTABLE CHEST 1 VIEW COMPARISON:  07/05/2020 FINDINGS: The heart size and mediastinal contours are within normal limits. Both lungs are clear. The visualized skeletal structures are unremarkable. IMPRESSION: No active disease. Electronically Signed   By: Helyn Numbers MD   On: 07/14/2020 10:43   DG Foot 2 Views Left  Result Date: 07/18/2020 CLINICAL DATA:  Left foot pain without known injury. EXAM: LEFT FOOT - 2 VIEW COMPARISON:  None. FINDINGS: There is no evidence of fracture or dislocation. There is no evidence of arthropathy or other focal bone abnormality. Soft tissues are unremarkable. IMPRESSION: Negative. Electronically Signed   By: Lupita Raider M.D.   On: 07/18/2020 10:11   VAS [name] LOWER EXTREMITY VENOUS (DVT)  Result Date: 07/15/2020  Lower Venous DVT Study Indications:  Edema.  Comparison Study: no prior Performing Technologist: Blanch Media RVS  Examination Guidelines: A complete evaluation includes B-mode imaging, spectral Doppler, color Doppler, and power Doppler as needed of all accessible portions of each vessel. Bilateral testing is considered an integral part of a complete examination. Limited examinations for reoccurring indications may be performed as noted. The reflux portion of the exam is performed with the patient in reverse Trendelenburg.  +---------+---------------+---------+-----------+----------+--------------+ RIGHT    CompressibilityPhasicitySpontaneityPropertiesThrombus Aging +---------+---------------+---------+-----------+----------+--------------+ CFV      Full           Yes      Yes                                 +---------+---------------+---------+-----------+----------+--------------+ SFJ      Full                                                        +---------+---------------+---------+-----------+----------+--------------+ FV Prox  Full                                                        +---------+---------------+---------+-----------+----------+--------------+ FV Mid   Full                                                        +---------+---------------+---------+-----------+----------+--------------+ FV DistalFull                                                        +---------+---------------+---------+-----------+----------+--------------+ PFV      Full                                                        +---------+---------------+---------+-----------+----------+--------------+ POP      Full           Yes      Yes                                 +---------+---------------+---------+-----------+----------+--------------+ PTV      Full                                                        +---------+---------------+---------+-----------+----------+--------------+ PERO      Full                                                        +---------+---------------+---------+-----------+----------+--------------+   +---------+---------------+---------+-----------+----------+--------------+  LEFT     CompressibilityPhasicitySpontaneityPropertiesThrombus Aging +---------+---------------+---------+-----------+----------+--------------+ CFV      Full           Yes      Yes                                 +---------+---------------+---------+-----------+----------+--------------+ SFJ      Full                                                        +---------+---------------+---------+-----------+----------+--------------+ FV Prox  Full                                                        +---------+---------------+---------+-----------+----------+--------------+ FV Mid   Full                                                        +---------+---------------+---------+-----------+----------+--------------+ FV DistalFull                                                        +---------+---------------+---------+-----------+----------+--------------+ PFV      Full                                                        +---------+---------------+---------+-----------+----------+--------------+ POP      Full           Yes      Yes                                 +---------+---------------+---------+-----------+----------+--------------+ PTV      Full                                                        +---------+---------------+---------+-----------+----------+--------------+ PERO     Full                                                        +---------+---------------+---------+-----------+----------+--------------+     Summary: BILATERAL: - No evidence of deep vein thrombosis seen in the lower extremities, bilaterally. - No evidence of superficial venous thrombosis in the lower extremities, bilaterally. -No evidence of  popliteal cyst, bilaterally.   *See table(s) above for measurements and observations. Electronically  signed by Heath Lark on 07/15/2020 at 3:49:54 PM.    Final

## 2020-07-26 DIAGNOSIS — U071 COVID-19: Secondary | ICD-10-CM | POA: Diagnosis not present

## 2020-07-26 MED ORDER — INDOMETHACIN ER 75 MG PO CPCR
75.0000 mg | ORAL_CAPSULE | Freq: Once | ORAL | Status: DC
  Filled 2020-07-26: qty 1

## 2020-07-26 NOTE — TOC Progression Note (Signed)
Transition of Care [facility]) - Progression Note    Patient Details  Name: Shirley Hansen MRN: [id number] Date of Birth: 03-Mar-1966  [facility] CM/SW Contact  Mearl Latin, LCSW Phone Number: 07/26/2020, 9:51 AM  Clinical Narrative:    Patient remains with no SNF bed offers.   Expected Discharge Plan: Skilled Nursing Facility Barriers to Discharge: SNF Pending bed offer,SNF Covid  Expected Discharge Plan and Services Expected Discharge Plan: Skilled Nursing Facility In-house Referral: Clinical Social Work   Post Acute Care Choice: Skilled Nursing Facility Living arrangements for the past 2 months: Apartment,Single Family Home                                       Social Determinants of Health (SDOH) Interventions    Readmission Risk Interventions No flowsheet data found.

## 2020-07-26 NOTE — [place] FL2 (Signed)
[place] MEDICAID FL2 LEVEL OF CARE SCREENING TOOL     IDENTIFICATION  Patient Name: Shirley Hansen Birthdate: 11/30/1965 Sex: female Admission Date (Current Location): 07/14/2020  [facility] and [place] Number:  Producer, television/film/video and Address:  [facility] [place], [address] [place], [place] [address]      Provider Number: [id number]  Attending Physician Name and Address:  Elgergawy, Leana Roe, MD  Relative Name and Phone Number:  Ernestine Mcmurray (niece) [phone]    Current Level of Care: Hospital Recommended Level of Care: Skilled Nursing Facility Prior Approval Number:    Date Approved/Denied:   PASRR Number: [id number]  Discharge Plan: SNF    Current Diagnoses: Patient Active Problem List   Diagnosis Date Noted  . COVID-19 07/14/2020  . Stroke (HCC)   . Stage 3a chronic kidney disease (HCC)   . Hypertension     Orientation RESPIRATION BLADDER Height & Weight     Self,Time,Situation,Place  Normal Incontinent,External catheter Weight: 175 lb (79.4 kg) Height:  5\' 2"  (157.5 cm)  BEHAVIORAL SYMPTOMS/MOOD NEUROLOGICAL BOWEL NUTRITION STATUS      Continent Diet (See DC summary)  AMBULATORY STATUS COMMUNICATION OF NEEDS Skin   Limited Assist Verbally Other (Comment) (Pressure injury on left posterior elbow)                       Personal Care Assistance Level of Assistance  Bathing,Feeding,Dressing Bathing Assistance: Maximum assistance Feeding assistance: Independent Dressing Assistance: Maximum assistance     Functional Limitations Info  Sight,Hearing,Speech Sight Info: Impaired Hearing Info: Adequate Speech Info: Adequate    SPECIAL CARE FACTORS FREQUENCY  PT (By licensed PT),OT (By licensed OT)     PT Frequency: 2X per week OT Frequency: 2X per week            Contractures Contractures Info: Not present    Additional Factors Info  Code Status,Allergies Code Status Info: Full Allergies Info: NKA     Isolation  Precautions Info: COVID-19 positive 1/24     Current Medications (07/26/2020):  This is the current hospital active medication list Current Facility-Administered Medications  Medication Dose Route Frequency Provider Last Rate Last Admin  . acetaminophen (TYLENOL) tablet 650 mg  650 mg Oral Q6H PRN 07/28/2020, MD   650 mg at 07/26/20 0906  . albuterol (VENTOLIN HFA) 108 (90 Base) MCG/ACT inhaler 2 puff  2 puff Inhalation Q2H PRN 07/28/20, MD      . amLODipine (NORVASC) tablet 10 mg  10 mg Oral Daily Jonah Blue, MD   10 mg at 07/26/20 0902  . ascorbic acid (VITAMIN C) tablet 500 mg  500 mg Oral Daily 07/28/20, MD   500 mg at 07/26/20 0902  . aspirin EC tablet 81 mg  81 mg Oral Daily 07/28/20, MD   81 mg at 07/26/20 0902  . atorvastatin (LIPITOR) tablet 40 mg  40 mg Oral 07/28/20, MD   40 mg at 07/25/20 2232  . brimonidine (ALPHAGAN) 0.2 % ophthalmic solution 1 drop  1 drop Both Eyes BID 2233, MD   1 drop at 07/26/20 0902  . calcium carbonate (dosed in mg elemental calcium) suspension 500 mg of elemental calcium  500 mg of elemental calcium Oral Q6H PRN 07/28/20, MD      . colchicine tablet 0.6 mg  0.6 mg Oral Daily Jonah Blue, MD   0.6 mg at 07/26/20 0902  . docusate sodium (ENEMEEZ)  enema 283 mg  1 enema Rectal PRN Jonah Blue, MD      . dorzolamide-timolol (COSOPT) 22.3-6.8 MG/ML ophthalmic solution 1 drop  1 drop Both Eyes BID Jonah Blue, MD   1 drop at 07/26/20 0903  . enoxaparin (LOVENOX) injection 40 mg  40 mg Subcutaneous Daily Elgergawy, Leana Roe, MD   40 mg at 07/26/20 0902  . feeding supplement (ENSURE ENLIVE / ENSURE PLUS) liquid 237 mL  237 mL Oral BID BM Leroy Sea, MD   237 mL at 07/26/20 0903  . feeding supplement (NEPRO CARB STEADY) liquid 237 mL  237 mL Oral TID PRN Jonah Blue, MD      . gabapentin (NEURONTIN) tablet 300 mg  300 mg Oral BID Leroy Sea, MD   300 mg at 07/26/20 0903  .  guaiFENesin-dextromethorphan (ROBITUSSIN DM) 100-10 MG/5ML syrup 10 mL  10 mL Oral Q4H PRN Jonah Blue, MD      . hydrALAZINE (APRESOLINE) injection 10 mg  10 mg Intravenous Q6H PRN Leroy Sea, MD      . hydrocortisone cream 1 %   Topical BID Leroy Sea, MD   Given at 07/26/20 [phone]  . latanoprost (XALATAN) 0.005 % ophthalmic solution 1 drop  1 drop Both Eyes QHS Jonah Blue, MD   1 drop at 07/25/20 2251  . loratadine (CLARITIN) tablet 10 mg  10 mg Oral Daily Leroy Sea, MD   10 mg at 07/26/20 0902  . multivitamin with minerals tablet 1 tablet  1 tablet Oral Daily Leroy Sea, MD   1 tablet at 07/26/20 0902  . ondansetron (ZOFRAN) injection 4 mg  4 mg Intravenous Q6H PRN Jonah Blue, MD      . pantoprazole (PROTONIX) EC tablet 40 mg  40 mg Oral Daily Leroy Sea, MD   40 mg at 07/26/20 0902  . polyethylene glycol (MIRALAX / GLYCOLAX) packet 17 g  17 g Oral Daily PRN Jonah Blue, MD   17 g at 07/19/20 1125  . sorbitol 70 % solution 30 mL  30 mL Oral PRN Jonah Blue, MD      . zinc sulfate capsule 220 mg  220 mg Oral Daily Jonah Blue, MD   220 mg at 07/26/20 0902  . zolpidem (AMBIEN) tablet 5 mg  5 mg Oral QHS PRN Jonah Blue, MD   5 mg at 07/25/20 2250     Discharge Medications: Please see discharge summary for a list of discharge medications.  Relevant Imaging Results:  Relevant Lab Results:   Additional Information SSN: [id number] Has only recieved one dose of the Pfizer vaccine. COVID + on 07/05/20, now off of isolation precautions.  Mearl Latin, LCSW

## 2020-07-26 NOTE — TOC Progression Note (Signed)
Transition of Care [facility]) - Progression Note    Patient Details  Name: Shirley Hansen MRN: [id number] Date of Birth: 15-Nov-1965  [facility]) CM/SW Contact  Mearl Latin, LCSW Phone Number: 07/26/2020, 9:51 AM  Clinical Narrative:    CSW faxed out SNF referral further now that patient is off of COVID precautions.    Expected Discharge Plan: Skilled Nursing Facility Barriers to Discharge: SNF Pending bed offer,SNF Covid  Expected Discharge Plan and Services Expected Discharge Plan: Skilled Nursing Facility In-house Referral: Clinical Social Work   Post Acute Care Choice: Skilled Nursing Facility Living arrangements for the past 2 months: Apartment,Single Family Home                                       Social Determinants of Health (SDOH) Interventions    Readmission Risk Interventions No flowsheet data found.

## 2020-07-26 NOTE — Progress Notes (Signed)
PROGRESS NOTE                                                                                                                                                                                                             Patient Demographics:    Shirley Hansen, is a 55 y.o. female, DOB - 11/09/1965, JJH:[id number]  Outpatient Primary MD for the patient is Pcp, No    LOS - 12  Admit date - 07/14/2020    Chief Complaint  Patient presents with  . Back Pain  . Weakness    Covid        Brief Narrative (HPI from H&P)    - Shirley Hansen is a 55 y.o. female with medical history significant of CVA with left-sided deficits; HTN; and stage 3a CKD presenting with worsening COVID symptoms, positive on 1/24.  She was seen in the ER on 1/24 for progressive and generalized weakness, she had no shortness of breath her main symptom was generalized weakness, in the ER her work-up was consistent with severe dehydration she was found to have extremely high D-dimer, AKI, hypotension and she was admitted   Subjective:   Patient denies any complaints today, shortness of breath, pain in her feet has significantly subsided.   Assessment  & Plan :    Acute COVID-19 infection  - she has received 1 shot of Pfizer vaccine recently, she has minimal pulmonary involvement and symptoms are more generalized with severe dehydration and AKI.  CRP is borderline but her D-dimer is extremely elevated.  She was treated with IV fluids, low-dose steroids and Remdesivir.  This problem has clinically resolved she is symptom-free on room air.  Encouraged the patient to sit up in chair in the daytime use I-S and flutter valve for pulmonary toiletry and then prone in bed when at night.  Will advance activity and titrate down oxygen as possible. -Leukocytosis in the setting of steroids, procalcitonin is reassuring. -Patient can come off isolation on 2/15   SpO2: 100  %  Recent Labs  Lab 07/21/20 0105 07/22/20 0144 07/23/20 0131 07/24/20 0136  WBC 31.2* 26.5* 31.6* 20.3*  HGB 11.4* 11.9* 11.7* 11.0*  HCT 33.1* 35.2* 36.1 35.2*  PLT 436* 384 356 289  CRP 0.7 0.7  --   --   BNP 69.8 96.5  --   --  DDIMER 1.66* 1.35*  --   --   PROCALCITON <0.10  --  <0.10  --   AST 42* 39 32 40  ALT 64* 66* 58* 54*  ALKPHOS 46 51 45 39  BILITOT 0.5 0.6 0.5 1.1  ALBUMIN 2.6* 2.7* 2.7* 2.6*    Dehydration with AKI on CKD stage IIIa -Resolved with IV fluids.  Transaminitis -Due to Covid, trending down.  History of CVA with left-sided hemiparesis.   -Supportive care.  Continue aspirin statin for secondary prevention, PT OT as needed.  High D-dimer - leg ultrasound & VQ negative .  Trending down  Obesity with BMI 32.  Follow with PCP for weight loss.  UTI.  -Finished  3 days of Rocephin.  Hypomagnesemia. Replaced on 07/17/2020.  Acute gout affecting both feet.  X-ray unremarkable, uric acid level elevated,  -Require as needed steroids and indomethacin, improving with colchicine.    leukocytocis -In the setting of steroids, procalcitonin within normal limit, she is nontoxic, afebrile      Condition -   Guarded  Family Communication  : Patient is coherent, I have discussed the case in details with her, answered all her questions, I did talk with her sister today via patient's FaceTime upon patient's request. Code Status :  Full  Consults  :  PCCM  Procedures  :    Leg [name]  -ve  VQ Scan  -ve  PUD Prophylaxis : PPI  Disposition Plan  :    Status is: Inpatient  Remains inpatient appropriate because:IV treatments appropriate due to intensity of illness or inability to take PO   Dispo: The patient is from: Home              Anticipated d/c is to: SNF              Anticipated d/c date is: 1 day              Patient currently is  medically stable to d/c. Patient is medically stable for discharge, awaiting SNF bed availability.   Difficult  to place patient Yes   DVT Prophylaxis  :  Hep gtt >> Lovenox  Lab Results  Component Value Date   PLT 289 07/24/2020    Diet :  Diet Order            Diet regular Room service appropriate? Yes; Fluid consistency: Thin  Diet effective now                  Inpatient Medications  Scheduled Meds: . amLODipine  10 mg Oral Daily  . vitamin C  500 mg Oral Daily  . aspirin EC  81 mg Oral Daily  . atorvastatin  40 mg Oral QHS  . brimonidine  1 drop Both Eyes BID  . colchicine  0.6 mg Oral Daily  . dorzolamide-timolol  1 drop Both Eyes BID  . enoxaparin (LOVENOX) injection  40 mg Subcutaneous Daily  . feeding supplement  237 mL Oral BID BM  . gabapentin  300 mg Oral BID  . hydrocortisone cream   Topical BID  . latanoprost  1 drop Both Eyes QHS  . loratadine  10 mg Oral Daily  . multivitamin with minerals  1 tablet Oral Daily  . pantoprazole  40 mg Oral Daily  . zinc sulfate  220 mg Oral Daily   Continuous Infusions:  PRN Meds:.acetaminophen **OR** [DISCONTINUED] acetaminophen, albuterol, calcium carbonate (dosed in mg elemental calcium), docusate sodium, feeding supplement (NEPRO CARB STEADY),  guaiFENesin-dextromethorphan, hydrALAZINE, [DISCONTINUED] ondansetron **OR** ondansetron (ZOFRAN) IV, polyethylene glycol, sorbitol, zolpidem  Antibiotics  :    Anti-infectives (From admission, onward)   Start     Dose/Rate Route Frequency Ordered Stop   07/15/20 1215  cefTRIAXone (ROCEPHIN) 1 g in sodium chloride 0.9 % 100 mL IVPB        1 g 200 mL/hr over 30 Minutes Intravenous Every 24 hours 07/15/20 1116 07/17/20 2005   07/15/20 1000  remdesivir 100 mg in sodium chloride 0.9 % 100 mL IVPB  Status:  Discontinued       "Followed by" Linked Group Details   100 mg 200 mL/hr over 30 Minutes Intravenous Daily 07/14/20 1449 07/14/20 1451   07/15/20 1000  remdesivir 100 mg in sodium chloride 0.9 % 100 mL IVPB       "Followed by" Linked Group Details   100 mg 200 mL/hr over 30  Minutes Intravenous Daily 07/14/20 1451 07/18/20 0942   07/14/20 1600  remdesivir 200 mg in sodium chloride 0.9% 250 mL IVPB       "Followed by" Linked Group Details   200 mg 580 mL/hr over 30 Minutes Intravenous Once 07/14/20 1451 07/14/20 1900   07/14/20 1500  remdesivir 200 mg in sodium chloride 0.9% 250 mL IVPB  Status:  Discontinued       "Followed by" Linked Group Details   200 mg 580 mL/hr over 30 Minutes Intravenous Once 07/14/20 1449 07/14/20 1451       Suhayla Chisom M.D on 07/26/2020 at 11:34 AM  To page go to [email or web address]   [facility] -  Office  [phone]  See all Orders from today for further details    Objective:   Vitals:   07/25/20 1227 07/25/20 2100 07/26/20 0643 07/26/20 0901  BP: 118/82 (!) 127/97 125/86 119/81  Pulse: (!) 101 (!) 103 (!) 101 94  Resp: Temp: 98.4 F (36.9 C) 98.8 F (37.1 C) 98.5 F (36.9 C)   TempSrc: Oral Oral Oral   SpO2: 100% 96% 100%   Weight:      Height:        Wt Readings from Last 3 Encounters:  07/14/20 79.4 kg     Intake/Output Summary (Last 24 hours) at 07/26/2020 1134 Last data filed at 07/26/2020 0900 Gross per 24 hour  Intake 360 ml  Output 1600 ml  Net -1240 ml     Physical Exam  Awake Alert, Oriented X 3, No new F.N deficits, Normal affect, left-sided deficits at baseline. Symmetrical Chest wall movement, Good air movement bilaterally, CTAB RRR,No Gallops,Rubs or new Murmurs, No Parasternal Heave +ve B.Sounds, Abd Soft, No tenderness, No rebound - guarding or rigidity. No Cyanosis, Clubbing or edema, No new Rash or bruise        Data Review:    CBC Recent Labs  Lab 07/21/20 0105 07/22/20 0144 07/23/20 0131 07/24/20 0136  WBC 31.2* 26.5* 31.6* 20.3*  HGB 11.4* 11.9* 11.7* 11.0*  HCT 33.1* 35.2* 36.1 35.2*  PLT 436* 384 356 289  MCV 88.5 89.8 89.8 93.1  MCH 30.5 30.4 29.1 29.1  MCHC 34.4 33.8 32.4 31.3  RDW 13.7 13.7 13.8 14.8  LYMPHSABS 3.7 3.1 2.9 4.4*  MONOABS  2.8* 1.0 2.2* 1.8*  EOSABS 0.0 0.0 0.0 0.1  BASOSABS 0.0 0.1 0.1 0.1    Recent Labs  Lab 07/21/20 0105 07/22/20 0144 07/23/20 0131 07/24/20 0136  NA 137 136 136 137  K 4.6 4.4 4.4 4.8  CL  99 98 99 102  CO2 28 26 27 26   GLUCOSE 126* 150* 126* 81  BUN 32* 32* 28* 29*  CREATININE 1.00 1.21* 1.02* 1.22*  CALCIUM 8.8* 8.8* 8.7* 8.6*  AST 42* 39 32 40  ALT 64* 66* 58* 54*  ALKPHOS 46 51 45 39  BILITOT 0.5 0.6 0.5 1.1  ALBUMIN 2.6* 2.7* 2.7* 2.6*  MG 1.9 2.2  --   --   CRP 0.7 0.7  --   --   DDIMER 1.66* 1.35*  --   --   PROCALCITON <0.10  --  <0.10  --   BNP 69.8 96.5  --   --     ------------------------------------------------------------------------------------------------------------------ No results for input(s): CHOL, HDL, LDLCALC, TRIG, CHOLHDL, LDLDIRECT in the last 72 hours.  No results found for: HGBA1C ------------------------------------------------------------------------------------------------------------------ No results for input(s): TSH, T4TOTAL, T3FREE, THYROIDAB in the last 72 hours.  Invalid input(s): FREET3  Cardiac Enzymes No results for input(s): CKMB, TROPONINI, MYOGLOBIN in the last 168 hours.  Invalid input(s): CK ------------------------------------------------------------------------------------------------------------------    Component Value Date/Time   BNP 96.5 07/22/2020 0144    Micro Results No results found for this or any previous visit (from the past 240 hour(s)).  Radiology Reports DG Chest 2 View  Result Date: 07/05/2020 CLINICAL DATA:  Shortness of breath and fatigue EXAM: CHEST - 2 VIEW COMPARISON:  None. FINDINGS: Indistinct densities at the lung bases on the frontal view. No edema, effusion, or pneumothorax. Normal heart size and mediastinal contours. IMPRESSION: Mild infiltrate or atelectasis at the lung bases, lateral view and low volumes favoring atelectasis. Electronically Signed   By: 07/07/2020 M.D.   On:  07/05/2020 10:26   CT Head Wo Contrast  Result Date: 07/05/2020 CLINICAL DATA:  Worsening left-sided weakness (history of CVA) also with history of nontraumatic ICH. EXAM: CT HEAD WITHOUT CONTRAST TECHNIQUE: Contiguous axial images were obtained from the base of the skull through the vertex without intravenous contrast. COMPARISON:  No pertinent prior exams available for comparison. FINDINGS: Brain: Cerebral volume is normal for age. There is a subcentimeter hypodensity within the right thalamocapsular junction with subtle peripheral curvilinear hyperdensity (for instance as seen on series 3, images 15-17). There are chronic lacunar infarcts within the left corona radiata and left pons. No demarcated cortical infarct. No extra-axial fluid collection. No evidence of intracranial mass. No midline shift. Vascular: No hyperdense vessel.  Atherosclerotic calcifications Skull: Normal. Negative for fracture or focal lesion. Sinuses/Orbits: Visualized orbits show no acute finding. No significant paranasal sinus disease at the imaged levels. These results were called by telephone at the time of interpretation on 07/05/2020 at 11:42 am to provider [facility] , who verbally acknowledged these results. IMPRESSION: Subcentimeter hypodensity within the right thalamocapsular junction with subtle peripheral curvilinear hyperdensity. This is favored to reflect subacute/chronic blood products at site of a subacute/chronic lacunar infarct. However, correlation with the patient's history and any available prior outside imaging is necessary. If this does not correlate with the patient's history and outside imaging, a contrast-enhanced brain MRI is recommended for further evaluation. Chronic lacunar infarcts within the left corona radiata and left basal ganglia. Electronically Signed   By: [facility] DO   On: 07/05/2020 11:43   MR BRAIN WO CONTRAST  Result Date: 07/05/2020 CLINICAL DATA:  Neuro deficit, acute, stroke  suspected History of thalamic ICH EXAM: MRI HEAD WITHOUT CONTRAST TECHNIQUE: Multiplanar, multiecho pulse sequences of the brain and surrounding structures were obtained without intravenous contrast. COMPARISON:  07/05/2020. FINDINGS:  Brain: No focal restricted diffusion. Remote microhemorrhages involving the left thalamus, cerebellum and left basal ganglia. Sequela of remote insult involving the right thalamus with associated hemosiderin deposition. No midline shift, ventriculomegaly or extra-axial fluid collection. No mass lesion. Chronic left corona radiata and pontine lacunar insults. Minimal chronic microvascular ischemic changes. Vascular: Normal flow voids. Skull and upper cervical spine: Normal marrow signal. Sinuses/Orbits: Normal orbits. Pneumatized paranasal sinuses. Bilateral mastoid free fluid. Other: None. IMPRESSION: Sequela of prior right thalamic hemorrhage. No acute intracranial hemorrhage or infarct. Remote insults involving the left corona radiata and pons. Sequela of hypertensive microangiopathy. Electronically Signed   By: Stana Buntinghikanele  Emekauwa M.D.   On: 07/05/2020 16:14   NM Pulmonary Perfusion  Result Date: 07/15/2020 CLINICAL DATA:  Elevated D-dimer, high risk of pulmonary embolism due to recent COVID infection EXAM: NUCLEAR MEDICINE PERFUSION LUNG SCAN TECHNIQUE: Perfusion images were obtained in multiple projections after intravenous injection of radiopharmaceutical. Ventilation scans intentionally deferred if perfusion scan and chest x-ray adequate for interpretation during COVID 19 epidemic. RADIOPHARMACEUTICALS:  4.3 mCi Tc-5678m MAA IV COMPARISON:  Chest radiograph 07/15/2020 FINDINGS: Normal perfusion lung scan. No perfusion defects identified. IMPRESSION: Normal perfusion lung scan. Electronically Signed   By: Ulyses SouthwardMark  Boles M.D.   On: 07/15/2020 15:54   US RENAL  Result Date: 07/14/2020 CLINICAL DATA:  Acute renal injury EXAM: RENAL / URINARY TRACT ULTRASOUND COMPLETE COMPARISON:   None. FINDINGS: Right Kidney: Renal measurements: 9.4 x 4.0 x 4.3 cm. = volume: 83 mL. Echogenicity within normal limits. No mass or hydronephrosis visualized. Left Kidney: Renal measurements: 10.8 x 5.2 x 4.2 cm. = volume: 124 mL. Echogenicity within normal limits. No mass or hydronephrosis visualized. Bladder: Appears normal for degree of bladder distention. Other: None. IMPRESSION: No acute abnormality noted. Electronically Signed   By: Alcide CleverMark  Lukens M.D.   On: 07/14/2020 21:15   DG Chest Port 1 View  Result Date: 07/15/2020 CLINICAL DATA:  Shortness of breath EXAM: PORTABLE CHEST 1 VIEW COMPARISON:  07/14/2020 FINDINGS: Numerous leads and wires project over the chest. Midline trachea. Normal heart size. Atherosclerosis in the transverse aorta. No pleural effusion or pneumothorax. Clear lungs. IMPRESSION: No acute cardiopulmonary disease. Aortic Atherosclerosis (ICD10-I70.0). Electronically Signed   By: Jeronimo GreavesKyle  Talbot M.D.   On: 07/15/2020 09:08   DG Chest Portable 1 View  Result Date: 07/14/2020 CLINICAL DATA:  Dyspnea, COVID pneumonia EXAM: PORTABLE CHEST 1 VIEW COMPARISON:  07/05/2020 FINDINGS: The heart size and mediastinal contours are within normal limits. Both lungs are clear. The visualized skeletal structures are unremarkable. IMPRESSION: No active disease. Electronically Signed   By: Helyn NumbersAshesh  Parikh MD   On: 07/14/2020 10:43   DG Foot 2 Views Left  Result Date: 07/18/2020 CLINICAL DATA:  Left foot pain without known injury. EXAM: LEFT FOOT - 2 VIEW COMPARISON:  None. FINDINGS: There is no evidence of fracture or dislocation. There is no evidence of arthropathy or other focal bone abnormality. Soft tissues are unremarkable. IMPRESSION: Negative. Electronically Signed   By: Lupita RaiderJames  Green Jr M.D.   On: 07/18/2020 10:11   VAS US LOWER EXTREMITY VENOUS (DVT)  Result Date: 07/15/2020  Lower Venous DVT Study Indications: Edema.  Comparison Study: no prior Performing Technologist: Blanch MediaMegan Riddle RVS   Examination Guidelines: A complete evaluation includes B-mode imaging, spectral Doppler, color Doppler, and power Doppler as needed of all accessible portions of each vessel. Bilateral testing is considered an integral part of a complete examination. Limited examinations for reoccurring indications may be performed as noted.  The reflux portion of the exam is performed with the patient in reverse Trendelenburg.  +---------+---------------+---------+-----------+----------+--------------+ RIGHT    CompressibilityPhasicitySpontaneityPropertiesThrombus Aging +---------+---------------+---------+-----------+----------+--------------+ CFV      Full           Yes      Yes                                 +---------+---------------+---------+-----------+----------+--------------+ SFJ      Full                                                        +---------+---------------+---------+-----------+----------+--------------+ FV Prox  Full                                                        +---------+---------------+---------+-----------+----------+--------------+ FV Mid   Full                                                        +---------+---------------+---------+-----------+----------+--------------+ FV DistalFull                                                        +---------+---------------+---------+-----------+----------+--------------+ PFV      Full                                                        +---------+---------------+---------+-----------+----------+--------------+ POP      Full           Yes      Yes                                 +---------+---------------+---------+-----------+----------+--------------+ PTV      Full                                                        +---------+---------------+---------+-----------+----------+--------------+ PERO     Full                                                         +---------+---------------+---------+-----------+----------+--------------+   +---------+---------------+---------+-----------+----------+--------------+ LEFT     CompressibilityPhasicitySpontaneityPropertiesThrombus Aging +---------+---------------+---------+-----------+----------+--------------+ CFV      Full           Yes  Yes                                 +---------+---------------+---------+-----------+----------+--------------+ SFJ      Full                                                        +---------+---------------+---------+-----------+----------+--------------+ FV Prox  Full                                                        +---------+---------------+---------+-----------+----------+--------------+ FV Mid   Full                                                        +---------+---------------+---------+-----------+----------+--------------+ FV DistalFull                                                        +---------+---------------+---------+-----------+----------+--------------+ PFV      Full                                                        +---------+---------------+---------+-----------+----------+--------------+ POP      Full           Yes      Yes                                 +---------+---------------+---------+-----------+----------+--------------+ PTV      Full                                                        +---------+---------------+---------+-----------+----------+--------------+ PERO     Full                                                        +---------+---------------+---------+-----------+----------+--------------+     Summary: BILATERAL: - No evidence of deep vein thrombosis seen in the lower extremities, bilaterally. - No evidence of superficial venous thrombosis in the lower extremities, bilaterally. -No evidence of popliteal cyst, bilaterally.   *See table(s) above for measurements  and observations. Electronically signed by Heath Lark on 07/15/2020 at 3:49:54 PM.    Final

## 2020-07-26 NOTE — Plan of Care (Signed)
  Problem: Education: Goal: Knowledge of risk factors and measures for prevention of condition will improve Outcome: Progressing   Problem: Coping: Goal: Psychosocial and spiritual needs will be supported Outcome: Progressing   Problem: Education: Goal: Knowledge of General Education information will improve Description: Including pain rating scale, medication(s)/side effects and non-pharmacologic comfort measures Outcome: Progressing   Problem: Health Behavior/Discharge Planning: Goal: Ability to manage health-related needs will improve Outcome: Progressing   Problem: Activity: Goal: Risk for activity intolerance will decrease Outcome: Progressing   Problem: Pain Managment: Goal: General experience of comfort will improve Outcome: Progressing

## 2020-07-26 NOTE — Progress Notes (Signed)
Physical Therapy Treatment Patient Details Name: Shirley Hansen MRN: [id number] DOB: 04-05-1966 Today's Date: 07/26/2020    History of Present Illness Pt is a 55 y.o. female who tested (+) COVID-19 on 07/05/20, now admitted 07/14/20 with generalized weakness; workup for dehydration, AKI, acute COVID-19 infection. PMH includes CVA (residual L-side deficits), HTN, CKD 3.    PT Comments    Pt required supervision bed mobility, min assist sit to stand, mod assist SPT without RW, and min assist ambulation 30' with RW. Pt reports L foot pain has improved. She is reporting 7/10 back pain which she states is chronic. Pt in recliner with feet elevated at end of session.    Follow Up Recommendations  SNF;Supervision for mobility/OOB     Equipment Recommendations  None recommended by PT    Recommendations for Other Services       Precautions / Restrictions Precautions Precautions: Fall;Other (comment) Precaution Comments: Residual L-side weakness; chronic foot pain (pt attributes to gout and stroke)    Mobility  Bed Mobility Overal bed mobility: Needs Assistance Bed Mobility: Supine to Sit     Supine to sit: Supervision;HOB elevated     General bed mobility comments: increased time, +rail    Transfers Overall transfer level: Needs assistance Equipment used: Ambulation equipment used Transfers: Sit to/from [employer] Sit to Stand: Min assist Stand pivot transfers: Mod assist       General transfer comment: assist to power up, mod assist SPT bed to recliner without RW,cues for sequencing  Ambulation/Gait Ambulation/Gait assistance: Min assist Gait Distance (Feet): 30 Feet Assistive device: Rolling walker (2 wheeled) Gait Pattern/deviations: Step-through pattern;Decreased stride length;Trunk flexed;Decreased weight shift to left Gait velocity: Decreased Gait velocity interpretation: <1.8 ft/sec, indicate of risk for recurrent falls General Gait Details: increased  time for L foot placement, assist to maintain L hand grip on RW, assist to maintain balance during turns and maneuvering around obstacles   Stairs             Wheelchair Mobility    Modified Rankin (Stroke Patients Only)       Balance Overall balance assessment: Needs assistance Sitting-balance support: Feet supported Sitting balance-Leahy Scale: Fair     Standing balance support: Single extremity supported;During functional activity;Bilateral upper extremity supported Standing balance-Leahy Scale: Poor Standing balance comment: reliant on external support                            Cognition Arousal/Alertness: Awake/alert Behavior During Therapy: WFL for tasks assessed/performed Overall Cognitive Status: No family/caregiver present to determine baseline cognitive functioning Area of Impairment: Attention;Following commands;Safety/judgement;Awareness;Problem solving                   Current Attention Level: Selective   Following Commands: Follows one step commands consistently Safety/Judgement: Decreased awareness of safety Awareness: Emergent Problem Solving: Slow processing;Decreased initiation;Requires verbal cues        Exercises      General Comments        Pertinent Vitals/Pain Pain Assessment: 0-10 Pain Score: 7  Pain Location: back Pain Descriptors / Indicators: Grimacing;Guarding;Discomfort Pain Intervention(s): Monitored during session;Limited activity within patient's tolerance;Repositioned    Home Living                      Prior Function            PT Goals (current goals can now be found in the care plan section) Acute  Rehab PT Goals Patient Stated Goal: get stronger Progress towards PT goals: Progressing toward goals    Frequency    Min 2X/week      PT Plan Current plan remains appropriate    Co-evaluation              AM-PAC PT "6 Clicks" Mobility   Outcome Measure  Help needed  turning from your back to your side while in a flat bed without using bedrails?: None Help needed moving from lying on your back to sitting on the side of a flat bed without using bedrails?: A Little Help needed moving to and from a bed to a chair (including a wheelchair)?: A Little Help needed standing up from a chair using your arms (e.g., wheelchair or bedside chair)?: A Little Help needed to walk in hospital room?: A Little Help needed climbing 3-5 steps with a railing? : A Lot 6 Click Score: 18    End of Session Equipment Utilized During Treatment: Gait belt Activity Tolerance: Patient tolerated treatment well Patient left: in chair;with call bell/phone within reach Nurse Communication: Mobility status PT Visit Diagnosis: Unsteadiness on feet (R26.81);Other abnormalities of gait and mobility (R26.89);Muscle weakness (generalized) (M62.81);Pain     Time: 3220-2542 PT Time Calculation (min) (ACUTE ONLY): 25 min  Charges:  $Gait Training: 23-37 mins                     Shirley Hansen,   Office # [phone] Pager [phone]    Shirley Hansen 07/26/2020, 10:15 AM

## 2020-07-27 DIAGNOSIS — N179 Acute kidney failure, unspecified: Secondary | ICD-10-CM | POA: Diagnosis not present

## 2020-07-27 DIAGNOSIS — M79671 Pain in right foot: Secondary | ICD-10-CM | POA: Diagnosis not present

## 2020-07-27 DIAGNOSIS — M79672 Pain in left foot: Secondary | ICD-10-CM | POA: Diagnosis not present

## 2020-07-27 DIAGNOSIS — U071 COVID-19: Secondary | ICD-10-CM | POA: Diagnosis not present

## 2020-07-27 NOTE — Progress Notes (Signed)
Occupational Therapy Treatment Patient Details Name: Shirley Hansen MRN: [id number] DOB: 1966/05/13 Today's Date: 07/27/2020    History of present illness Pt is a 55 y.o. female who tested (+) COVID-19 on 07/05/20, now admitted 07/14/20 with generalized weakness; workup for dehydration, AKI, acute COVID-19 infection. PMH includes CVA (residual L-side deficits), HTN, CKD 3.   OT comments  Pt requiring increased assist for [facility] transfers today due to heavy R lateral lean in standing requiring +2 physical assist. Pt also tearful throughout session regarding current situation and benefits from active listening/encouragement. Pt requires Total A for toileting hygiene with assist from NT during session. Assessed ROM of L UE and provided education on initial activities and exercises to complete to improve overall function. Plan to provide complete HEP encompassing coordination and strength during next session to maximize engagement in recovery outside of therapy sessions. Continue to recommend DC to SNF.    Follow Up Recommendations  SNF    Equipment Recommendations  None recommended by OT    Recommendations for Other Services      Precautions / Restrictions Precautions Precautions: Fall;Other (comment) Precaution Comments: Residual L-side weakness; chronic foot pain (pt attributes to gout and stroke) Restrictions Weight Bearing Restrictions: No       Mobility Bed Mobility               General bed mobility comments: received on BSC  Transfers Overall transfer level: Needs assistance Equipment used: 2 person hand held assist Transfers: Sit to/from [employer] Transfers Sit to Stand: Mod assist Stand pivot transfers: Max assist;+2 safety/equipment;+2 physical assistance       General transfer comment: Mod A for initial power up from [facility]. Assist of 2 needed initially to maintain safe standing due to R lateral lean in standing. Assisted with therapist in front for manual stepping  to recliner from [facility] to R side    Balance Overall balance assessment: Needs assistance Sitting-balance support: Feet supported Sitting balance-Leahy Scale: Fair Sitting balance - Comments: able to maintain static sitting   Standing balance support: Single extremity supported;During functional activity;Bilateral upper extremity supported Standing balance-Leahy Scale: Poor Standing balance comment: reliant on external support                           ADL either performed or assessed with clinical judgement   ADL Overall ADL's : Needs assistance/impaired     Grooming: Set up;Sitting;Wash/dry face                   Toilet Transfer: Maximal assistance;+2 for safety/equipment;+2 for physical assistance;Stand-pivot Toilet Transfer Details (indicate cue type and reason): Received on BSC with NTs. assisted with transfer from [facility] to recliner with R lateral lean. Support provided at knee to assist in stepping to recliner with manual assist. second person helpful for safety due to initial heavy R lateral lean in standing Toileting- Clothing Manipulation and Hygiene: Total assistance;+2 for safety/equipment;+2 for physical assistance Toileting - Clothing Manipulation Details (indicate cue type and reason): Total A x 2 in standing. one person to assist with balance in standing while second assists with hygiene and clothing mgmt       General ADL Comments: Pt limited by poor coordination and balance in standing. Tearful and anxious over situation     Vision   Vision Assessment?: Vision impaired- to be further tested in functional context Additional Comments: Pt reports able to look straight ahead, focus and read sign but when scanning  room - reports eyes/gaze jumping   Perception     Praxis      Cognition Arousal/Alertness: Awake/alert Behavior During Therapy: WFL for tasks assessed/performed;Flat affect Overall Cognitive Status: No family/caregiver present to determine  baseline cognitive functioning Area of Impairment: Attention;Safety/judgement;Awareness;Problem solving                   Current Attention Level: Selective   Following Commands: Follows one step commands consistently Safety/Judgement: Decreased awareness of safety Awareness: Emergent Problem Solving: Slow processing;Decreased initiation;Requires verbal cues General Comments: Pt tearful about situation for most of session, responds well with active listening and encouragement. Pt follows commands well, some decreased awareness of safety though not impulsive with movements. Fearful of being a burden to others due to need of requiring assist.        Exercises     Shoulder Instructions       General Comments Tearful throughout, VSS on RA. Pt reports desire to talk to someone about mental health issues with current situation - coordinated with nursing about chaplain or psych visit. Assessed ROM and instructed pt in exercises to complete and positioning strategies. Plan to provide pt with putty, squeeze ball and theraband to assist in maximizing strength    Pertinent Vitals/ Pain       Pain Assessment: Faces Faces Pain Scale: Hurts little more Pain Location: L LE Pain Descriptors / Indicators: Tingling;Pins and needles;Numbness Pain Intervention(s): Monitored during session  Home Living                                          Prior Functioning/Environment              Frequency  Min 2X/week        Progress Toward Goals  OT Goals(current goals can now be found in the care plan section)  Progress towards OT goals: OT to reassess next treatment  Acute Rehab OT Goals Patient Stated Goal: get stronger OT Goal Formulation: With patient Time For Goal Achievement: 08/10/20 Potential to Achieve Goals: Good ADL Goals Pt Will Perform Grooming: with min assist;standing Pt Will Perform Upper Body Bathing: with supervision;with set-up;sitting Pt Will  Perform Lower Body Bathing: with mod assist;sit to/from stand Pt Will Perform Upper Body Dressing: with set-up;with supervision;sitting Pt Will Perform Lower Body Dressing: with mod assist;sit to/from stand Pt Will Transfer to Toilet: with mod assist;stand pivot transfer;bedside commode Pt Will Perform Toileting - Clothing Manipulation and hygiene: with mod assist;sit to/from stand Pt/caregiver will Perform Home Exercise Program: Increased strength;Left upper extremity;With theraband;With theraputty;With written HEP provided;With Supervision  Plan Discharge plan remains appropriate    Co-evaluation                 AM-PAC OT "6 Clicks" Daily Activity     Outcome Measure   Help from another person eating meals?: None Help from another person taking care of personal grooming?: A Little Help from another person toileting, which includes using toliet, bedpan, or urinal?: Total Help from another person bathing (including washing, rinsing, drying)?: A Lot Help from another person to put on and taking off regular upper body clothing?: A Little Help from another person to put on and taking off regular lower body clothing?: Total 6 Click Score: 14    End of Session Equipment Utilized During Treatment: Gait belt  OT Visit Diagnosis: Unsteadiness on feet (R26.81);Hemiplegia and hemiparesis;Pain  Hemiplegia - Right/Left: Left Hemiplegia - dominant/non-dominant: Non-Dominant Hemiplegia - caused by: Cerebral infarction Pain - Right/Left: Left Pain - part of body: Leg   Activity Tolerance Patient tolerated treatment well   Patient Left in chair;with call bell/phone within reach   Nurse Communication Mobility status;Other (comment) (desire for psych)        Time: 2924-4628 OT Time Calculation (min): 46 min  Charges: OT General Charges $OT Visit: 1 Visit OT Treatments $Self Care/Home Management : 23-37 mins $Therapeutic Activity: 8-22 mins  Lorre Munroe, OTR/L   Lorre Munroe 07/27/2020, 1:19 PM

## 2020-07-27 NOTE — Progress Notes (Signed)
PROGRESS NOTE                                                                                                                                                                                                             Patient Demographics:    Shirley Hansen, is a 55 y.o. female, DOB - 28-May-1966, ONG:[id number]  Outpatient Primary MD for the patient is Pcp, No    LOS - 13  Admit date - 07/14/2020    Chief Complaint  Patient presents with  . Back Pain  . Weakness    Covid        Brief Narrative (HPI from H&P)    - Shirley Hansen is a 55 y.o. female with medical history significant of CVA with left-sided deficits; HTN; and stage 3a CKD presenting with worsening COVID symptoms, positive on 1/24.  She was seen in the ER on 1/24 for progressive and generalized weakness, she had no shortness of breath her main symptom was generalized weakness, in the ER her work-up was consistent with severe dehydration she was found to have extremely high D-dimer, AKI, hypotension and she was admitted for COVID-19 infection, she was treated with steroids, Remdesivir, hypoxia has resolved, she is currently symptom-free, patient with left-sided weakness at baseline due to history of CVA, recommendation has been made for SNF, awaiting SNF bed availability.   Subjective:   Patient denies any complaints today, shortness of breath, pain in her feet has significantly subsided.   Assessment  & Plan :    Acute COVID-19 infection  - she has received 1 shot of Pfizer vaccine recently, she has minimal pulmonary involvement and symptoms are more generalized with severe dehydration and AKI.  CRP is borderline but her D-dimer is extremely elevated.  She was treated with IV fluids, low-dose steroids and Remdesivir.  This problem has clinically resolved she is symptom-free on room air.  Encouraged the patient to sit up in chair in the daytime use I-S and flutter valve  for pulmonary toiletry and then prone in bed when at night.  Will advance activity and titrate down oxygen as possible. -Leukocytosis in the setting of steroids, procalcitonin is reassuring. Patient will come off COVID-19 isolation today.   SpO2: 99 %  Recent Labs  Lab 07/21/20 0105 07/22/20 0144 07/23/20 0131 07/24/20 0136  WBC 31.2* 26.5* 31.6* 20.3*  HGB 11.4* 11.9* 11.7* 11.0*  HCT 33.1* 35.2* 36.1 35.2*  PLT 436* 384 356 289  CRP 0.7 0.7  --   --   BNP 69.8 96.5  --   --   DDIMER 1.66* 1.35*  --   --   PROCALCITON <0.10  --  <0.10  --   AST 42* 39 32 40  ALT 64* 66* 58* 54*  ALKPHOS 46 51 45 39  BILITOT 0.5 0.6 0.5 1.1  ALBUMIN 2.6* 2.7* 2.7* 2.6*    Dehydration with AKI on CKD stage IIIa -Resolved with IV fluids.  Transaminitis -Due to Covid, trending down.  History of CVA with left-sided hemiparesis.   -Supportive care.  Continue aspirin statin for secondary prevention, PT OT as needed.  High D-dimer - leg ultrasound & VQ negative .  Trending down  Obesity with BMI 32.  Follow with PCP for weight loss.  UTI.  -Finished  3 days of Rocephin.  Hypomagnesemia. Replaced on 07/17/2020.  Acute gout affecting both feet.  X-ray unremarkable, uric acid level elevated,  -Require as needed steroids and indomethacin, improving with colchicine.    leukocytocis -In the setting of steroids, procalcitonin within normal limit, she is nontoxic, afebrile      Condition -   Guarded  Family Communication  : Patient is coherent, I have discussed the case in details with her, answered all her questions, I did talk with her sister today via patient's FaceTime upon patient's request 2/14 Code Status :  Full  Consults  :  PCCM  Procedures  :    Leg US  -ve  VQ Scan  -ve  PUD Prophylaxis : PPI  Disposition Plan  :    Status is: Inpatient  Remains inpatient appropriate because:IV treatments appropriate due to intensity of illness or inability to take PO   Dispo:  The patient is from: Home              Anticipated d/c is to: SNF              Anticipated d/c date is: 1 day              Patient currently is  medically stable to d/c. Patient is medically stable for discharge, awaiting SNF bed availability.   Difficult to place patient Yes   DVT Prophylaxis  :  Hep gtt >> Lovenox  Lab Results  Component Value Date   PLT 289 07/24/2020    Diet :  Diet Order            Diet regular Room service appropriate? Yes; Fluid consistency: Thin  Diet effective now                  Inpatient Medications  Scheduled Meds: . amLODipine  10 mg Oral Daily  . vitamin C  500 mg Oral Daily  . aspirin EC  81 mg Oral Daily  . atorvastatin  40 mg Oral QHS  . brimonidine  1 drop Both Eyes BID  . colchicine  0.6 mg Oral Daily  . dorzolamide-timolol  1 drop Both Eyes BID  . enoxaparin (LOVENOX) injection  40 mg Subcutaneous Daily  . feeding supplement  237 mL Oral BID BM  . gabapentin  300 mg Oral BID  . hydrocortisone cream   Topical BID  . indomethacin  75 mg Oral Once  . latanoprost  1 drop Both Eyes QHS  . loratadine  10 mg Oral Daily  . multivitamin with minerals  1 tablet Oral Daily  . pantoprazole  40 mg Oral Daily  . zinc sulfate  220 mg Oral Daily   Continuous Infusions:  PRN Meds:.acetaminophen **OR** [DISCONTINUED] acetaminophen, albuterol, calcium carbonate (dosed in mg elemental calcium), docusate sodium, feeding supplement (NEPRO CARB STEADY), guaiFENesin-dextromethorphan, hydrALAZINE, [DISCONTINUED] ondansetron **OR** ondansetron (ZOFRAN) IV, polyethylene glycol, sorbitol, zolpidem  Antibiotics  :    Anti-infectives (From admission, onward)   Start     Dose/Rate Route Frequency Ordered Stop   07/15/20 1215  cefTRIAXone (ROCEPHIN) 1 g in sodium chloride 0.9 % 100 mL IVPB        1 g 200 mL/hr over 30 Minutes Intravenous Every 24 hours 07/15/20 1116 07/17/20 2005   07/15/20 1000  remdesivir 100 mg in sodium chloride 0.9 % 100 mL IVPB   Status:  Discontinued       "Followed by" Linked Group Details   100 mg 200 mL/hr over 30 Minutes Intravenous Daily 07/14/20 1449 07/14/20 1451   07/15/20 1000  remdesivir 100 mg in sodium chloride 0.9 % 100 mL IVPB       "Followed by" Linked Group Details   100 mg 200 mL/hr over 30 Minutes Intravenous Daily 07/14/20 1451 07/18/20 0942   07/14/20 1600  remdesivir 200 mg in sodium chloride 0.9% 250 mL IVPB       "Followed by" Linked Group Details   200 mg 580 mL/hr over 30 Minutes Intravenous Once 07/14/20 1451 07/14/20 1900   07/14/20 1500  remdesivir 200 mg in sodium chloride 0.9% 250 mL IVPB  Status:  Discontinued       "Followed by" Linked Group Details   200 mg 580 mL/hr over 30 Minutes Intravenous Once 07/14/20 1449 07/14/20 1451       Dawood Elgergawy M.D on 07/27/2020 at 3:18 PM  To page go to [email or web address]   [facility] -  Office  [phone]  See all Orders from today for further details    Objective:   Vitals:   07/26/20 0901 07/26/20 1201 07/26/20 2027 07/27/20 0433  BP: 119/81 104/84 126/85 114/73  Pulse: 94 100 (!) 103 92  Resp:  18 16 18   Temp:  98.4 F (36.9 C) 98.5 F (36.9 C) 98.5 F (36.9 C)  TempSrc:  Oral Oral Oral  SpO2:  100% 100% 99%  Weight:    82.7 kg  Height:        Wt Readings from Last 3 Encounters:  07/27/20 82.7 kg     Intake/Output Summary (Last 24 hours) at 07/27/2020 1518 Last data filed at 07/27/2020 1012 Gross per 24 hour  Intake 240 ml  Output 1600 ml  Net -1360 ml     Physical Exam  Awake Alert, Oriented X 3, No new F.N deficits, Normal affect, left-sided deficits at baseline. Symmetrical Chest wall movement, Good air movement bilaterally, CTAB RRR,No Gallops,Rubs or new Murmurs, No Parasternal Heave +ve B.Sounds, Abd Soft, No tenderness, No rebound - guarding or rigidity. No Cyanosis, Clubbing or edema, No new Rash or bruise      Data Review:    CBC Recent Labs  Lab 07/21/20 0105 07/22/20 0144  07/23/20 0131 07/24/20 0136  WBC 31.2* 26.5* 31.6* 20.3*  HGB 11.4* 11.9* 11.7* 11.0*  HCT 33.1* 35.2* 36.1 35.2*  PLT 436* 384 356 289  MCV 88.5 89.8 89.8 93.1  MCH 30.5 30.4 29.1 29.1  MCHC 34.4 33.8 32.4 31.3  RDW 13.7 13.7 13.8 14.8  LYMPHSABS 3.7 3.1 2.9 4.4*  MONOABS 2.8* 1.0 2.2*  1.8*  EOSABS 0.0 0.0 0.0 0.1  BASOSABS 0.0 0.1 0.1 0.1    Recent Labs  Lab 07/21/20 0105 07/22/20 0144 07/23/20 0131 07/24/20 0136  NA 137 136 136 137  K 4.6 4.4 4.4 4.8  CL 99 98 99 102  CO2 28 26 27 26   GLUCOSE 126* 150* 126* 81  BUN 32* 32* 28* 29*  CREATININE 1.00 1.21* 1.02* 1.22*  CALCIUM 8.8* 8.8* 8.7* 8.6*  AST 42* 39 32 40  ALT 64* 66* 58* 54*  ALKPHOS 46 51 45 39  BILITOT 0.5 0.6 0.5 1.1  ALBUMIN 2.6* 2.7* 2.7* 2.6*  MG 1.9 2.2  --   --   CRP 0.7 0.7  --   --   DDIMER 1.66* 1.35*  --   --   PROCALCITON <0.10  --  <0.10  --   BNP 69.8 96.5  --   --     ------------------------------------------------------------------------------------------------------------------ No results for input(s): CHOL, HDL, LDLCALC, TRIG, CHOLHDL, LDLDIRECT in the last 72 hours.  No results found for: HGBA1C ------------------------------------------------------------------------------------------------------------------ No results for input(s): TSH, T4TOTAL, T3FREE, THYROIDAB in the last 72 hours.  Invalid input(s): FREET3  Cardiac Enzymes No results for input(s): CKMB, TROPONINI, MYOGLOBIN in the last 168 hours.  Invalid input(s): CK ------------------------------------------------------------------------------------------------------------------    Component Value Date/Time   BNP 96.5 07/22/2020 0144    Micro Results No results found for this or any previous visit (from the past 240 hour(s)).  Radiology Reports DG Chest 2 View  Result Date: 07/05/2020 CLINICAL DATA:  Shortness of breath and fatigue EXAM: CHEST - 2 VIEW COMPARISON:  None. FINDINGS: Indistinct densities at the  lung bases on the frontal view. No edema, effusion, or pneumothorax. Normal heart size and mediastinal contours. IMPRESSION: Mild infiltrate or atelectasis at the lung bases, lateral view and low volumes favoring atelectasis. Electronically Signed   By: 07/07/2020 M.D.   On: 07/05/2020 10:26   CT Head Wo Contrast  Result Date: 07/05/2020 CLINICAL DATA:  Worsening left-sided weakness (history of CVA) also with history of nontraumatic ICH. EXAM: CT HEAD WITHOUT CONTRAST TECHNIQUE: Contiguous axial images were obtained from the base of the skull through the vertex without intravenous contrast. COMPARISON:  No pertinent prior exams available for comparison. FINDINGS: Brain: Cerebral volume is normal for age. There is a subcentimeter hypodensity within the right thalamocapsular junction with subtle peripheral curvilinear hyperdensity (for instance as seen on series 3, images 15-17). There are chronic lacunar infarcts within the left corona radiata and left pons. No demarcated cortical infarct. No extra-axial fluid collection. No evidence of intracranial mass. No midline shift. Vascular: No hyperdense vessel.  Atherosclerotic calcifications Skull: Normal. Negative for fracture or focal lesion. Sinuses/Orbits: Visualized orbits show no acute finding. No significant paranasal sinus disease at the imaged levels. These results were called by telephone at the time of interpretation on 07/05/2020 at 11:42 am to provider [facility] , who verbally acknowledged these results. IMPRESSION: Subcentimeter hypodensity within the right thalamocapsular junction with subtle peripheral curvilinear hyperdensity. This is favored to reflect subacute/chronic blood products at site of a subacute/chronic lacunar infarct. However, correlation with the patient's history and any available prior outside imaging is necessary. If this does not correlate with the patient's history and outside imaging, a contrast-enhanced brain MRI is  recommended for further evaluation. Chronic lacunar infarcts within the left corona radiata and left basal ganglia. Electronically Signed   By: [facility] DO   On: 07/05/2020 11:43   MR BRAIN WO  CONTRAST  Result Date: 07/05/2020 CLINICAL DATA:  Neuro deficit, acute, stroke suspected History of thalamic ICH EXAM: MRI HEAD WITHOUT CONTRAST TECHNIQUE: Multiplanar, multiecho pulse sequences of the brain and surrounding structures were obtained without intravenous contrast. COMPARISON:  07/05/2020. FINDINGS: Brain: No focal restricted diffusion. Remote microhemorrhages involving the left thalamus, cerebellum and left basal ganglia. Sequela of remote insult involving the right thalamus with associated hemosiderin deposition. No midline shift, ventriculomegaly or extra-axial fluid collection. No mass lesion. Chronic left corona radiata and pontine lacunar insults. Minimal chronic microvascular ischemic changes. Vascular: Normal flow voids. Skull and upper cervical spine: Normal marrow signal. Sinuses/Orbits: Normal orbits. Pneumatized paranasal sinuses. Bilateral mastoid free fluid. Other: None. IMPRESSION: Sequela of prior right thalamic hemorrhage. No acute intracranial hemorrhage or infarct. Remote insults involving the left corona radiata and pons. Sequela of hypertensive microangiopathy. Electronically Signed   By: Stana Bunting M.D.   On: 07/05/2020 16:14   NM Pulmonary Perfusion  Result Date: 07/15/2020 CLINICAL DATA:  Elevated D-dimer, high risk of pulmonary embolism due to recent COVID infection EXAM: NUCLEAR MEDICINE PERFUSION LUNG SCAN TECHNIQUE: Perfusion images were obtained in multiple projections after intravenous injection of radiopharmaceutical. Ventilation scans intentionally deferred if perfusion scan and chest x-ray adequate for interpretation during COVID 19 epidemic. RADIOPHARMACEUTICALS:  4.3 mCi Tc-67m MAA IV COMPARISON:  Chest radiograph 07/15/2020 FINDINGS: Normal perfusion lung  scan. No perfusion defects identified. IMPRESSION: Normal perfusion lung scan. Electronically Signed   By: Ulyses Southward M.D.   On: 07/15/2020 15:54   US RENAL  Result Date: 07/14/2020 CLINICAL DATA:  Acute renal injury EXAM: RENAL / URINARY TRACT ULTRASOUND COMPLETE COMPARISON:  None. FINDINGS: Right Kidney: Renal measurements: 9.4 x 4.0 x 4.3 cm. = volume: 83 mL. Echogenicity within normal limits. No mass or hydronephrosis visualized. Left Kidney: Renal measurements: 10.8 x 5.2 x 4.2 cm. = volume: 124 mL. Echogenicity within normal limits. No mass or hydronephrosis visualized. Bladder: Appears normal for degree of bladder distention. Other: None. IMPRESSION: No acute abnormality noted. Electronically Signed   By: Alcide Clever M.D.   On: 07/14/2020 21:15   DG Chest Port 1 View  Result Date: 07/15/2020 CLINICAL DATA:  Shortness of breath EXAM: PORTABLE CHEST 1 VIEW COMPARISON:  07/14/2020 FINDINGS: Numerous leads and wires project over the chest. Midline trachea. Normal heart size. Atherosclerosis in the transverse aorta. No pleural effusion or pneumothorax. Clear lungs. IMPRESSION: No acute cardiopulmonary disease. Aortic Atherosclerosis (ICD10-I70.0). Electronically Signed   By: Jeronimo Greaves M.D.   On: 07/15/2020 09:08   DG Chest Portable 1 View  Result Date: 07/14/2020 CLINICAL DATA:  Dyspnea, COVID pneumonia EXAM: PORTABLE CHEST 1 VIEW COMPARISON:  07/05/2020 FINDINGS: The heart size and mediastinal contours are within normal limits. Both lungs are clear. The visualized skeletal structures are unremarkable. IMPRESSION: No active disease. Electronically Signed   By: Helyn Numbers MD   On: 07/14/2020 10:43   DG Foot 2 Views Left  Result Date: 07/18/2020 CLINICAL DATA:  Left foot pain without known injury. EXAM: LEFT FOOT - 2 VIEW COMPARISON:  None. FINDINGS: There is no evidence of fracture or dislocation. There is no evidence of arthropathy or other focal bone abnormality. Soft tissues are  unremarkable. IMPRESSION: Negative. Electronically Signed   By: Lupita Raider M.D.   On: 07/18/2020 10:11   VAS [name] LOWER EXTREMITY VENOUS (DVT)  Result Date: 07/15/2020  Lower Venous DVT Study Indications: Edema.  Comparison Study: no prior Performing Technologist: Blanch Media RVS  Examination Guidelines: A  complete evaluation includes B-mode imaging, spectral Doppler, color Doppler, and power Doppler as needed of all accessible portions of each vessel. Bilateral testing is considered an integral part of a complete examination. Limited examinations for reoccurring indications may be performed as noted. The reflux portion of the exam is performed with the patient in reverse Trendelenburg.  +---------+---------------+---------+-----------+----------+--------------+ RIGHT    CompressibilityPhasicitySpontaneityPropertiesThrombus Aging +---------+---------------+---------+-----------+----------+--------------+ CFV      Full           Yes      Yes                                 +---------+---------------+---------+-----------+----------+--------------+ SFJ      Full                                                        +---------+---------------+---------+-----------+----------+--------------+ FV Prox  Full                                                        +---------+---------------+---------+-----------+----------+--------------+ FV Mid   Full                                                        +---------+---------------+---------+-----------+----------+--------------+ FV DistalFull                                                        +---------+---------------+---------+-----------+----------+--------------+ PFV      Full                                                        +---------+---------------+---------+-----------+----------+--------------+ POP      Full           Yes      Yes                                  +---------+---------------+---------+-----------+----------+--------------+ PTV      Full                                                        +---------+---------------+---------+-----------+----------+--------------+ PERO     Full                                                        +---------+---------------+---------+-----------+----------+--------------+   +---------+---------------+---------+-----------+----------+--------------+  LEFT     CompressibilityPhasicitySpontaneityPropertiesThrombus Aging +---------+---------------+---------+-----------+----------+--------------+ CFV      Full           Yes      Yes                                 +---------+---------------+---------+-----------+----------+--------------+ SFJ      Full                                                        +---------+---------------+---------+-----------+----------+--------------+ FV Prox  Full                                                        +---------+---------------+---------+-----------+----------+--------------+ FV Mid   Full                                                        +---------+---------------+---------+-----------+----------+--------------+ FV DistalFull                                                        +---------+---------------+---------+-----------+----------+--------------+ PFV      Full                                                        +---------+---------------+---------+-----------+----------+--------------+ POP      Full           Yes      Yes                                 +---------+---------------+---------+-----------+----------+--------------+ PTV      Full                                                        +---------+---------------+---------+-----------+----------+--------------+ PERO     Full                                                         +---------+---------------+---------+-----------+----------+--------------+     Summary: BILATERAL: - No evidence of deep vein thrombosis seen in the lower extremities, bilaterally. - No evidence of superficial venous thrombosis in the lower extremities, bilaterally. -No evidence of popliteal cyst, bilaterally.   *See table(s) above for measurements and observations. Electronically  signed by Heath Lark on 07/15/2020 at 3:49:54 PM.    Final

## 2020-07-28 DIAGNOSIS — U071 COVID-19: Secondary | ICD-10-CM | POA: Diagnosis not present

## 2020-07-28 DIAGNOSIS — N1831 Chronic kidney disease, stage 3a: Secondary | ICD-10-CM | POA: Diagnosis not present

## 2020-07-28 DIAGNOSIS — I10 Essential (primary) hypertension: Secondary | ICD-10-CM | POA: Diagnosis not present

## 2020-07-28 DIAGNOSIS — N179 Acute kidney failure, unspecified: Secondary | ICD-10-CM | POA: Diagnosis not present

## 2020-07-28 LAB — BASIC METABOLIC PANEL
Anion gap: 10 (ref 5–15)
BUN: 20 mg/dL (ref 6–20)
CO2: 27 mmol/L (ref 22–32)
Calcium: 8.9 mg/dL (ref 8.9–10.3)
Chloride: 102 mmol/L (ref 98–111)
Creatinine, Ser: 0.84 mg/dL (ref 0.44–1.00)
GFR, Estimated: >60 mL/min (ref >60)
Glucose, Bld: 120 mg/dL — ABNORMAL HIGH (ref 70–99)
Potassium: 4.1 mmol/L (ref 3.5–5.1)
Sodium: 139 mmol/L (ref 135–145)

## 2020-07-28 LAB — CBC
HCT: 34.5 % — ABNORMAL LOW (ref 36.0–46.0)
Hemoglobin: 11.4 g/dL — ABNORMAL LOW (ref 12.0–15.0)
MCH: 30.3 pg (ref 26.0–34.0)
MCHC: 33 g/dL (ref 30.0–36.0)
MCV: 91.8 fL (ref 80.0–100.0)
Platelets: 219 10*3/uL (ref 150–400)
RBC: 3.76 MIL/uL — ABNORMAL LOW (ref 3.87–5.11)
RDW: 15.8 % — ABNORMAL HIGH (ref 11.5–15.5)
WBC: 8.7 10*3/uL (ref 4.0–10.5)
nRBC: 0 % (ref 0.0–0.2)

## 2020-07-28 MED ORDER — ADULT MULTIVITAMIN W/MINERALS CH: 1.0000 | ORAL_TABLET | Freq: Every day | ORAL | Status: DC

## 2020-07-28 MED ORDER — ALBUTEROL SULFATE HFA 108 (90 BASE) MCG/ACT IN AERS: 2.0000 | INHALATION_SPRAY | RESPIRATORY_TRACT | Status: AC | PRN

## 2020-07-28 MED ORDER — NEPRO/CARBSTEADY PO LIQD: 237.0000 mL | Freq: Three times a day (TID) | ORAL | 0 refills | Status: DC | PRN

## 2020-07-28 MED ORDER — PANTOPRAZOLE SODIUM 40 MG PO TBEC: 40.0000 mg | DELAYED_RELEASE_TABLET | Freq: Every day | ORAL | Status: DC

## 2020-07-28 MED ORDER — LORATADINE 10 MG PO TABS: 10.0000 mg | ORAL_TABLET | Freq: Every day | ORAL | Status: DC

## 2020-07-28 MED ORDER — COLCHICINE 0.6 MG PO TABS: 0.6000 mg | ORAL_TABLET | Freq: Every day | ORAL | Status: DC

## 2020-07-28 MED ORDER — AMLODIPINE BESYLATE 10 MG PO TABS: 10.0000 mg | ORAL_TABLET | Freq: Every day | ORAL | Status: DC

## 2020-07-28 MED ORDER — POLYETHYLENE GLYCOL 3350 17 G PO PACK: 17.0000 g | PACK | Freq: Every day | ORAL | 0 refills | Status: DC | PRN

## 2020-07-28 MED ORDER — ENSURE ENLIVE PO LIQD: 237.0000 mL | Freq: Two times a day (BID) | ORAL | 12 refills | Status: DC

## 2020-07-28 NOTE — Discharge Summary (Signed)
PATIENT DETAILS Name: Shirley Hansen Age: 55 y.o. Sex: female Date of Birth: 1965-10-16 MRN: [id number]. Admitting Physician: Jonah Blue, MD PCP:Pcp, No  Admit Date: 07/14/2020 Discharge date: 07/28/2020  Recommendations for Outpatient Follow-up:  1. Follow up with PCP in 1-2 weeks 2. Please obtain CMP/CBC in one week   Admitted From:  Home  Disposition: SNF   Home Health: No  Equipment/Devices: None  Discharge Condition: Stable  CODE STATUS: FULL CODE  Diet recommendation:  Diet Order            Diet - low sodium heart healthy           Diet regular Room service appropriate? Yes; Fluid consistency: Thin  Diet effective now                  Brief Summary: See H&P, Labs, Consult and Test reports for all details in brief, patient is a 55 year old female with history of CVA-chronic left-sided hemiparesis, HTN, CKD stage IIIa, Covid positive on 1/24-presented with generalized weakness-dehydration-AKI-and subsequently admitted to the hospitalist service.  See below for further details.  Brief Hospital Course: COVID-19 infection: Presented with generalized weakness/AKI-very minimal pulmonary symptoms.  Significantly improved-has completed a course of steroids/Remdesivir.  Elevated D-dimer: Doppler of lower extremity/VQ scan negative-suspect due to COVID-19 related inflammation.  On aspirin.  AKI on CKD stage IIIa: AKI hemodynamically mediated-secondary to dehydration-resolved with IV fluids.  Transaminitis: Due to COVID-19 related inflammation-downtrending-repeat LFTs in 1 week  History of CVA with left-sided hemiparesis: At baseline-continue aspirin/statin  UTI: Completed 3-day course of Rocephin  Acute gouty arthritis involving both feet: Treated with steroids/NSAIDs-improving-continue colchicine  Leukocytosis: Due to steroids-resolved  HTN: BP stable-continue amlodipine.  Lisinopril/HCTZ on hold  Obesity: Estimated body mass index is 33.35 kg/m as  calculated from the following:   Height as of this encounter: 5\' 2"  (1.575 m).   Weight as of this encounter: 82.7 kg.   RN pressure injury documentation: Pressure Injury 07/14/20 Elbow Left;Posterior (Active)  07/14/20 1630  Location: Elbow  Location Orientation: Left;Posterior  Staging:   Wound Description (Comments):   Present on Admission:     Discharge Diagnoses:  Active Problems:   COVID-19   Stroke [facility])   Stage 3a chronic kidney disease (HCC)   Hypertension   Discharge Instructions:    Person Under Monitoring Name: Shirley Hansen  Location: [address] [place]   Infection Prevention Recommendations for Individuals Confirmed to have, or Being Evaluated for, 2019 Novel Coronavirus (COVID-19) Infection Who Receive Care at Home  Individuals who are confirmed to have, or are being evaluated for, COVID-19 should follow the prevention steps below until a healthcare provider or local or state health department says they can return to normal activities.  Stay home except to get medical care You should restrict activities outside your home, except for getting medical care. Do not go to work, school, or public areas, and do not use public transportation or taxis.  Call ahead before visiting your doctor Before your medical appointment, call the healthcare provider and tell them that you have, or are being evaluated for, COVID-19 infection. This will help the healthcare provider's office take steps to keep other people from getting infected. Ask your healthcare provider to call the local or state health department.  Monitor your symptoms Seek prompt medical attention if your illness is worsening (e.g., difficulty breathing). Before going to your medical appointment, call the healthcare provider and tell them that you have, or are being  evaluated for, COVID-19 infection. Ask your healthcare provider to call the local or state health department.  Wear a  facemask You should wear a facemask that covers your nose and mouth when you are in the same room with other people and when you visit a healthcare provider. People who live with or visit you should also wear a facemask while they are in the same room with you.  Separate yourself from other people in your home As much as possible, you should stay in a different room from other people in your home. Also, you should use a separate bathroom, if available.  Avoid sharing household items You should not share dishes, drinking glasses, cups, eating utensils, towels, bedding, or other items with other people in your home. After using these items, you should wash them thoroughly with soap and water.  Cover your coughs and sneezes Cover your mouth and nose with a tissue when you cough or sneeze, or you can cough or sneeze into your sleeve. Throw used tissues in a lined trash can, and immediately wash your hands with soap and water for at least 20 seconds or use an alcohol-based hand rub.  Wash your [employer] your hands often and thoroughly with soap and water for at least 20 seconds. You can use an alcohol-based hand sanitizer if soap and water are not available and if your hands are not visibly dirty. Avoid touching your eyes, nose, and mouth with unwashed hands.   Prevention Steps for Caregivers and Household Members of Individuals Confirmed to have, or Being Evaluated for, COVID-19 Infection Being Cared for in the Home  If you live with, or provide care at home for, a person confirmed to have, or being evaluated for, COVID-19 infection please follow these guidelines to prevent infection:  Follow healthcare provider's instructions Make sure that you understand and can help the patient follow any healthcare provider instructions for all care.  Provide for the patient's basic needs You should help the patient with basic needs in the home and provide support for getting groceries,  prescriptions, and other personal needs.  Monitor the patient's symptoms If they are getting sicker, call his or her medical provider and tell them that the patient has, or is being evaluated for, COVID-19 infection. This will help the healthcare provider's office take steps to keep other people from getting infected. Ask the healthcare provider to call the local or state health department.  Limit the number of people who have contact with the patient  If possible, have only one caregiver for the patient.  Other household members should stay in another home or place of residence. If this is not possible, they should stay  in another room, or be separated from the patient as much as possible. Use a separate bathroom, if available.  Restrict visitors who do not have an essential need to be in the home.  Keep older adults, very young children, and other sick people away from the patient Keep older adults, very young children, and those who have compromised immune systems or chronic health conditions away from the patient. This includes people with chronic heart, lung, or kidney conditions, diabetes, and cancer.  Ensure good ventilation Make sure that shared spaces in the home have good air flow, such as from an air conditioner or an opened window, weather permitting.  Wash your hands often  Wash your hands often and thoroughly with soap and water for at least 20 seconds. You can use an alcohol based hand  sanitizer if soap and water are not available and if your hands are not visibly dirty.  Avoid touching your eyes, nose, and mouth with unwashed hands.  Use disposable paper towels to dry your hands. If not available, use dedicated cloth towels and replace them when they become wet.  Wear a facemask and gloves  Wear a disposable facemask at all times in the room and gloves when you touch or have contact with the patient's blood, body fluids, and/or secretions or excretions, such as  sweat, saliva, sputum, nasal mucus, vomit, urine, or feces.  Ensure the mask fits over your nose and mouth tightly, and do not touch it during use.  Throw out disposable facemasks and gloves after using them. Do not reuse.  Wash your hands immediately after removing your facemask and gloves.  If your personal clothing becomes contaminated, carefully remove clothing and launder. Wash your hands after handling contaminated clothing.  Place all used disposable facemasks, gloves, and other waste in a lined container before disposing them with other household waste.  Remove gloves and wash your hands immediately after handling these items.  Do not share dishes, glasses, or other household items with the patient  Avoid sharing household items. You should not share dishes, drinking glasses, cups, eating utensils, towels, bedding, or other items with a patient who is confirmed to have, or being evaluated for, COVID-19 infection.  After the person uses these items, you should wash them thoroughly with soap and water.  Wash laundry thoroughly  Immediately remove and wash clothes or bedding that have blood, body fluids, and/or secretions or excretions, such as sweat, saliva, sputum, nasal mucus, vomit, urine, or feces, on them.  Wear gloves when handling laundry from the patient.  Read and follow directions on labels of laundry or clothing items and detergent. In general, wash and dry with the warmest temperatures recommended on the label.  Clean all areas the individual has used often  Clean all touchable surfaces, such as counters, tabletops, doorknobs, bathroom fixtures, toilets, phones, keyboards, tablets, and bedside tables, every day. Also, clean any surfaces that may have blood, body fluids, and/or secretions or excretions on them.  Wear gloves when cleaning surfaces the patient has come in contact with.  Use a diluted bleach solution (e.g., dilute bleach with 1 part bleach and 10 parts  water) or a household disinfectant with a label that says EPA-registered for coronaviruses. To make a bleach solution at home, add 1 tablespoon of bleach to 1 quart (4 cups) of water. For a larger supply, add  cup of bleach to 1 gallon (16 cups) of water.  Read labels of cleaning products and follow recommendations provided on product labels. Labels contain instructions for safe and effective use of the cleaning product including precautions you should take when applying the product, such as wearing gloves or eye protection and making sure you have good ventilation during use of the product.  Remove gloves and wash hands immediately after cleaning.  Monitor yourself for signs and symptoms of illness Caregivers and household members are considered close contacts, should monitor their health, and will be asked to limit movement outside of the home to the extent possible. Follow the monitoring steps for close contacts listed on the symptom monitoring form.   ? If you have additional questions, contact your local health department or call the epidemiologist on call at [phone] (available 24/7). ? This guidance is subject to change. For the most up-to-date guidance from [facility], please refer to their  website: [employer]    Activity:  As tolerated  Discharge Instructions    Diet - low sodium heart healthy   Complete by: As directed    Increase activity slowly   Complete by: As directed    No dressing needed   Complete by: As directed      Allergies as of 07/28/2020   No Known Allergies     Medication List    STOP taking these medications   hydrochlorothiazide 25 MG tablet Commonly known as: HYDRODIURIL   lisinopril 40 MG tablet Commonly known as: ZESTRIL   methocarbamol 500 MG tablet Commonly known as: ROBAXIN   potassium chloride SA 20 MEQ tablet Commonly known as: KLOR-CON     TAKE these medications    albuterol 108 (90 Base) MCG/ACT inhaler Commonly known as: VENTOLIN HFA Inhale 2 puffs into the lungs every 2 (two) hours as needed for wheezing or shortness of breath.   amLODipine 10 MG tablet Commonly known as: NORVASC Take 1 tablet (10 mg total) by mouth daily. Start taking on: July 29, 2020   aspirin EC 81 MG tablet Take 81 mg by mouth daily. Swallow whole.   atorvastatin 40 MG tablet Commonly known as: LIPITOR Take 40 mg by mouth at bedtime.   brimonidine 0.2 % ophthalmic solution Commonly known as: ALPHAGAN Place 1 drop into both eyes 2 (two) times daily.   colchicine 0.6 MG tablet Take 1 tablet (0.6 mg total) by mouth daily. Start taking on: July 29, 2020   diclofenac Sodium 1 % Gel Commonly known as: VOLTAREN Apply 1 application topically 4 (four) times daily as needed (pain).   dorzolamide-timolol 22.3-6.8 MG/ML ophthalmic solution Commonly known as: COSOPT Place 1 drop into both eyes 2 (two) times daily.   feeding supplement (NEPRO CARB STEADY) Liqd Take 237 mLs by mouth 3 (three) times daily as needed (Supplement).   feeding supplement Liqd Take 237 mLs by mouth 2 (two) times daily between meals.   gabapentin 300 MG capsule Commonly known as: NEURONTIN Take 600 mg by mouth at bedtime.   GENTEAL TEARS OP Place 1 drop into both eyes daily as needed (dry eyes).   latanoprost 0.005 % ophthalmic solution Commonly known as: XALATAN Place 1 drop into both eyes at bedtime.   loratadine 10 MG tablet Commonly known as: CLARITIN Take 1 tablet (10 mg total) by mouth daily. Start taking on: July 29, 2020   multivitamin with minerals Tabs tablet Take 1 tablet by mouth daily. Start taking on: July 29, 2020   pantoprazole 40 MG tablet Commonly known as: PROTONIX Take 1 tablet (40 mg total) by mouth daily. Start taking on: July 29, 2020   polyethylene glycol 17 g packet Commonly known as: MIRALAX / GLYCOLAX Take 17 g by mouth daily as  needed for mild constipation.            Discharge Care Instructions  (From admission, onward)         Start     Ordered   07/28/20 0000  No dressing needed        07/28/20 1240          Contact information for follow-up providers    [facility] [place], [employer]. Call.   Specialty: Professional Counselor Why: For mental health resources Contact information: [facility] [place] [address] [place] [address] [phone]        Primary care MD. Schedule an appointment as soon as possible for a  visit in 1 week(s).            Contact information for after-discharge care    Destination    [facility] .   Service: Skilled Nursing Contact information: [address] [place] [id number] [phone]                 No Known Allergies    Other Procedures/Studies: DG Chest 2 View  Result Date: 07/05/2020 CLINICAL DATA:  Shortness of breath and fatigue EXAM: CHEST - 2 VIEW COMPARISON:  None. FINDINGS: Indistinct densities at the lung bases on the frontal view. No edema, effusion, or pneumothorax. Normal heart size and mediastinal contours. IMPRESSION: Mild infiltrate or atelectasis at the lung bases, lateral view and low volumes favoring atelectasis. Electronically Signed   By: Marnee SpringJonathon  Watts M.D.   On: 07/05/2020 10:26   CT Head Wo Contrast  Result Date: 07/05/2020 CLINICAL DATA:  Worsening left-sided weakness (history of CVA) also with history of nontraumatic ICH. EXAM: CT HEAD WITHOUT CONTRAST TECHNIQUE: Contiguous axial images were obtained from the base of the skull through the vertex without intravenous contrast. COMPARISON:  No pertinent prior exams available for comparison. FINDINGS: Brain: Cerebral volume is normal for age. There is a subcentimeter hypodensity within the right thalamocapsular junction with subtle peripheral curvilinear hyperdensity (for instance as seen on series 3, images  15-17). There are chronic lacunar infarcts within the left corona radiata and left pons. No demarcated cortical infarct. No extra-axial fluid collection. No evidence of intracranial mass. No midline shift. Vascular: No hyperdense vessel.  Atherosclerotic calcifications Skull: Normal. Negative for fracture or focal lesion. Sinuses/Orbits: Visualized orbits show no acute finding. No significant paranasal sinus disease at the imaged levels. These results were called by telephone at the time of interpretation on 07/05/2020 at 11:42 am to provider [facility] , who verbally acknowledged these results. IMPRESSION: Subcentimeter hypodensity within the right thalamocapsular junction with subtle peripheral curvilinear hyperdensity. This is favored to reflect subacute/chronic blood products at site of a subacute/chronic lacunar infarct. However, correlation with the patient's history and any available prior outside imaging is necessary. If this does not correlate with the patient's history and outside imaging, a contrast-enhanced brain MRI is recommended for further evaluation. Chronic lacunar infarcts within the left corona radiata and left basal ganglia. Electronically Signed   By: Jackey LogeKyle  Golden DO   On: 07/05/2020 11:43   MR BRAIN WO CONTRAST  Result Date: 07/05/2020 CLINICAL DATA:  Neuro deficit, acute, stroke suspected History of thalamic ICH EXAM: MRI HEAD WITHOUT CONTRAST TECHNIQUE: Multiplanar, multiecho pulse sequences of the brain and surrounding structures were obtained without intravenous contrast. COMPARISON:  07/05/2020. FINDINGS: Brain: No focal restricted diffusion. Remote microhemorrhages involving the left thalamus, cerebellum and left basal ganglia. Sequela of remote insult involving the right thalamus with associated hemosiderin deposition. No midline shift, ventriculomegaly or extra-axial fluid collection. No mass lesion. Chronic left corona radiata and pontine lacunar insults. Minimal chronic  microvascular ischemic changes. Vascular: Normal flow voids. Skull and upper cervical spine: Normal marrow signal. Sinuses/Orbits: Normal orbits. Pneumatized paranasal sinuses. Bilateral mastoid free fluid. Other: None. IMPRESSION: Sequela of prior right thalamic hemorrhage. No acute intracranial hemorrhage or infarct. Remote insults involving the left corona radiata and pons. Sequela of hypertensive microangiopathy. Electronically Signed   By: Stana Buntinghikanele  Emekauwa M.D.   On: 07/05/2020 16:14   NM Pulmonary Perfusion  Result Date: 07/15/2020 CLINICAL DATA:  Elevated D-dimer, high risk of pulmonary embolism due to recent COVID  infection EXAM: NUCLEAR MEDICINE PERFUSION LUNG SCAN TECHNIQUE: Perfusion images were obtained in multiple projections after intravenous injection of radiopharmaceutical. Ventilation scans intentionally deferred if perfusion scan and chest x-ray adequate for interpretation during COVID 19 epidemic. RADIOPHARMACEUTICALS:  4.3 mCi Tc-1m MAA IV COMPARISON:  Chest radiograph 07/15/2020 FINDINGS: Normal perfusion lung scan. No perfusion defects identified. IMPRESSION: Normal perfusion lung scan. Electronically Signed   By: Ulyses Southward M.D.   On: 07/15/2020 15:54   US RENAL  Result Date: 07/14/2020 CLINICAL DATA:  Acute renal injury EXAM: RENAL / URINARY TRACT ULTRASOUND COMPLETE COMPARISON:  None. FINDINGS: Right Kidney: Renal measurements: 9.4 x 4.0 x 4.3 cm. = volume: 83 mL. Echogenicity within normal limits. No mass or hydronephrosis visualized. Left Kidney: Renal measurements: 10.8 x 5.2 x 4.2 cm. = volume: 124 mL. Echogenicity within normal limits. No mass or hydronephrosis visualized. Bladder: Appears normal for degree of bladder distention. Other: None. IMPRESSION: No acute abnormality noted. Electronically Signed   By: Alcide Clever M.D.   On: 07/14/2020 21:15   DG Chest Port 1 View  Result Date: 07/15/2020 CLINICAL DATA:  Shortness of breath EXAM: PORTABLE CHEST 1 VIEW COMPARISON:   07/14/2020 FINDINGS: Numerous leads and wires project over the chest. Midline trachea. Normal heart size. Atherosclerosis in the transverse aorta. No pleural effusion or pneumothorax. Clear lungs. IMPRESSION: No acute cardiopulmonary disease. Aortic Atherosclerosis (ICD10-I70.0). Electronically Signed   By: Jeronimo Greaves M.D.   On: 07/15/2020 09:08   DG Chest Portable 1 View  Result Date: 07/14/2020 CLINICAL DATA:  Dyspnea, COVID pneumonia EXAM: PORTABLE CHEST 1 VIEW COMPARISON:  07/05/2020 FINDINGS: The heart size and mediastinal contours are within normal limits. Both lungs are clear. The visualized skeletal structures are unremarkable. IMPRESSION: No active disease. Electronically Signed   By: Helyn Numbers MD   On: 07/14/2020 10:43   DG Foot 2 Views Left  Result Date: 07/18/2020 CLINICAL DATA:  Left foot pain without known injury. EXAM: LEFT FOOT - 2 VIEW COMPARISON:  None. FINDINGS: There is no evidence of fracture or dislocation. There is no evidence of arthropathy or other focal bone abnormality. Soft tissues are unremarkable. IMPRESSION: Negative. Electronically Signed   By: Lupita Raider M.D.   On: 07/18/2020 10:11   VAS [name] LOWER EXTREMITY VENOUS (DVT)  Result Date: 07/15/2020  Lower Venous DVT Study Indications: Edema.  Comparison Study: no prior Performing Technologist: Blanch Media RVS  Examination Guidelines: A complete evaluation includes B-mode imaging, spectral Doppler, color Doppler, and power Doppler as needed of all accessible portions of each vessel. Bilateral testing is considered an integral part of a complete examination. Limited examinations for reoccurring indications may be performed as noted. The reflux portion of the exam is performed with the patient in reverse Trendelenburg.  +---------+---------------+---------+-----------+----------+--------------+ RIGHT    CompressibilityPhasicitySpontaneityPropertiesThrombus Aging  +---------+---------------+---------+-----------+----------+--------------+ CFV      Full           Yes      Yes                                 +---------+---------------+---------+-----------+----------+--------------+ SFJ      Full                                                        +---------+---------------+---------+-----------+----------+--------------+  FV Prox  Full                                                        +---------+---------------+---------+-----------+----------+--------------+ FV Mid   Full                                                        +---------+---------------+---------+-----------+----------+--------------+ FV DistalFull                                                        +---------+---------------+---------+-----------+----------+--------------+ PFV      Full                                                        +---------+---------------+---------+-----------+----------+--------------+ POP      Full           Yes      Yes                                 +---------+---------------+---------+-----------+----------+--------------+ PTV      Full                                                        +---------+---------------+---------+-----------+----------+--------------+ PERO     Full                                                        +---------+---------------+---------+-----------+----------+--------------+   +---------+---------------+---------+-----------+----------+--------------+ LEFT     CompressibilityPhasicitySpontaneityPropertiesThrombus Aging +---------+---------------+---------+-----------+----------+--------------+ CFV      Full           Yes      Yes                                 +---------+---------------+---------+-----------+----------+--------------+ SFJ      Full                                                         +---------+---------------+---------+-----------+----------+--------------+ FV Prox  Full                                                        +---------+---------------+---------+-----------+----------+--------------+  FV Mid   Full                                                        +---------+---------------+---------+-----------+----------+--------------+ FV DistalFull                                                        +---------+---------------+---------+-----------+----------+--------------+ PFV      Full                                                        +---------+---------------+---------+-----------+----------+--------------+ POP      Full           Yes      Yes                                 +---------+---------------+---------+-----------+----------+--------------+ PTV      Full                                                        +---------+---------------+---------+-----------+----------+--------------+ PERO     Full                                                        +---------+---------------+---------+-----------+----------+--------------+     Summary: BILATERAL: - No evidence of deep vein thrombosis seen in the lower extremities, bilaterally. - No evidence of superficial venous thrombosis in the lower extremities, bilaterally. -No evidence of popliteal cyst, bilaterally.   *See table(s) above for measurements and observations. Electronically signed by Heath Lark on 07/15/2020 at 3:49:54 PM.    Final      TODAY-DAY OF DISCHARGE:  Subjective:   Shirley Hansen today has no headache,no chest abdominal pain,no new weakness tingling or numbness, feels much better wants to go home today.   Objective:   Blood pressure 120/78, pulse 94, temperature 98.5 F (36.9 C), temperature source Oral, resp. rate 18, height 5\' 2"  (1.575 m), weight 82.7 kg, SpO2 99 %.  Intake/Output Summary (Last 24 hours) at 07/28/2020 1240 Last data filed  at 07/28/2020 1050 Gross per 24 hour  Intake 840 ml  Output 1500 ml  Net -660 ml   Filed Weights   07/14/20 1116 07/27/20 0433  Weight: 79.4 kg 82.7 kg    Exam: Awake Alert, Oriented *3, No new F.N deficits, Normal affect Devol.AT,PERRAL Supple Neck,No JVD, No cervical lymphadenopathy appriciated.  Symmetrical Chest wall movement, Good air movement bilaterally, CTAB RRR,No Gallops,Rubs or new Murmurs, No Parasternal Heave +ve B.Sounds, Abd Soft, Non tender, No organomegaly appriciated, No rebound -guarding or rigidity. No Cyanosis, Clubbing or edema, No new Rash  or bruise   PERTINENT RADIOLOGIC STUDIES: DG Chest 2 View  Result Date: 07/05/2020 CLINICAL DATA:  Shortness of breath and fatigue EXAM: CHEST - 2 VIEW COMPARISON:  None. FINDINGS: Indistinct densities at the lung bases on the frontal view. No edema, effusion, or pneumothorax. Normal heart size and mediastinal contours. IMPRESSION: Mild infiltrate or atelectasis at the lung bases, lateral view and low volumes favoring atelectasis. Electronically Signed   By: Marnee Spring M.D.   On: 07/05/2020 10:26   CT Head Wo Contrast  Result Date: 07/05/2020 CLINICAL DATA:  Worsening left-sided weakness (history of CVA) also with history of nontraumatic ICH. EXAM: CT HEAD WITHOUT CONTRAST TECHNIQUE: Contiguous axial images were obtained from the base of the skull through the vertex without intravenous contrast. COMPARISON:  No pertinent prior exams available for comparison. FINDINGS: Brain: Cerebral volume is normal for age. There is a subcentimeter hypodensity within the right thalamocapsular junction with subtle peripheral curvilinear hyperdensity (for instance as seen on series 3, images 15-17). There are chronic lacunar infarcts within the left corona radiata and left pons. No demarcated cortical infarct. No extra-axial fluid collection. No evidence of intracranial mass. No midline shift. Vascular: No hyperdense vessel.  Atherosclerotic  calcifications Skull: Normal. Negative for fracture or focal lesion. Sinuses/Orbits: Visualized orbits show no acute finding. No significant paranasal sinus disease at the imaged levels. These results were called by telephone at the time of interpretation on 07/05/2020 at 11:42 am to provider [facility] , who verbally acknowledged these results. IMPRESSION: Subcentimeter hypodensity within the right thalamocapsular junction with subtle peripheral curvilinear hyperdensity. This is favored to reflect subacute/chronic blood products at site of a subacute/chronic lacunar infarct. However, correlation with the patient's history and any available prior outside imaging is necessary. If this does not correlate with the patient's history and outside imaging, a contrast-enhanced brain MRI is recommended for further evaluation. Chronic lacunar infarcts within the left corona radiata and left basal ganglia. Electronically Signed   By: Jackey Loge DO   On: 07/05/2020 11:43   MR BRAIN WO CONTRAST  Result Date: 07/05/2020 CLINICAL DATA:  Neuro deficit, acute, stroke suspected History of thalamic ICH EXAM: MRI HEAD WITHOUT CONTRAST TECHNIQUE: Multiplanar, multiecho pulse sequences of the brain and surrounding structures were obtained without intravenous contrast. COMPARISON:  07/05/2020. FINDINGS: Brain: No focal restricted diffusion. Remote microhemorrhages involving the left thalamus, cerebellum and left basal ganglia. Sequela of remote insult involving the right thalamus with associated hemosiderin deposition. No midline shift, ventriculomegaly or extra-axial fluid collection. No mass lesion. Chronic left corona radiata and pontine lacunar insults. Minimal chronic microvascular ischemic changes. Vascular: Normal flow voids. Skull and upper cervical spine: Normal marrow signal. Sinuses/Orbits: Normal orbits. Pneumatized paranasal sinuses. Bilateral mastoid free fluid. Other: None. IMPRESSION: Sequela of prior right  thalamic hemorrhage. No acute intracranial hemorrhage or infarct. Remote insults involving the left corona radiata and pons. Sequela of hypertensive microangiopathy. Electronically Signed   By: Stana Bunting M.D.   On: 07/05/2020 16:14   NM Pulmonary Perfusion  Result Date: 07/15/2020 CLINICAL DATA:  Elevated D-dimer, high risk of pulmonary embolism due to recent COVID infection EXAM: NUCLEAR MEDICINE PERFUSION LUNG SCAN TECHNIQUE: Perfusion images were obtained in multiple projections after intravenous injection of radiopharmaceutical. Ventilation scans intentionally deferred if perfusion scan and chest x-ray adequate for interpretation during COVID 19 epidemic. RADIOPHARMACEUTICALS:  4.3 mCi Tc-42m MAA IV COMPARISON:  Chest radiograph 07/15/2020 FINDINGS: Normal perfusion lung scan. No perfusion defects identified. IMPRESSION: Normal perfusion lung scan. Electronically Signed  By: Ulyses Southward M.D.   On: 07/15/2020 15:54   US RENAL  Result Date: 07/14/2020 CLINICAL DATA:  Acute renal injury EXAM: RENAL / URINARY TRACT ULTRASOUND COMPLETE COMPARISON:  None. FINDINGS: Right Kidney: Renal measurements: 9.4 x 4.0 x 4.3 cm. = volume: 83 mL. Echogenicity within normal limits. No mass or hydronephrosis visualized. Left Kidney: Renal measurements: 10.8 x 5.2 x 4.2 cm. = volume: 124 mL. Echogenicity within normal limits. No mass or hydronephrosis visualized. Bladder: Appears normal for degree of bladder distention. Other: None. IMPRESSION: No acute abnormality noted. Electronically Signed   By: Alcide Clever M.D.   On: 07/14/2020 21:15   DG Chest Port 1 View  Result Date: 07/15/2020 CLINICAL DATA:  Shortness of breath EXAM: PORTABLE CHEST 1 VIEW COMPARISON:  07/14/2020 FINDINGS: Numerous leads and wires project over the chest. Midline trachea. Normal heart size. Atherosclerosis in the transverse aorta. No pleural effusion or pneumothorax. Clear lungs. IMPRESSION: No acute cardiopulmonary disease. Aortic  Atherosclerosis (ICD10-I70.0). Electronically Signed   By: Jeronimo Greaves M.D.   On: 07/15/2020 09:08   DG Chest Portable 1 View  Result Date: 07/14/2020 CLINICAL DATA:  Dyspnea, COVID pneumonia EXAM: PORTABLE CHEST 1 VIEW COMPARISON:  07/05/2020 FINDINGS: The heart size and mediastinal contours are within normal limits. Both lungs are clear. The visualized skeletal structures are unremarkable. IMPRESSION: No active disease. Electronically Signed   By: Helyn Numbers MD   On: 07/14/2020 10:43   DG Foot 2 Views Left  Result Date: 07/18/2020 CLINICAL DATA:  Left foot pain without known injury. EXAM: LEFT FOOT - 2 VIEW COMPARISON:  None. FINDINGS: There is no evidence of fracture or dislocation. There is no evidence of arthropathy or other focal bone abnormality. Soft tissues are unremarkable. IMPRESSION: Negative. Electronically Signed   By: Lupita Raider M.D.   On: 07/18/2020 10:11   VAS [name] LOWER EXTREMITY VENOUS (DVT)  Result Date: 07/15/2020  Lower Venous DVT Study Indications: Edema.  Comparison Study: no prior Performing Technologist: Blanch Media RVS  Examination Guidelines: A complete evaluation includes B-mode imaging, spectral Doppler, color Doppler, and power Doppler as needed of all accessible portions of each vessel. Bilateral testing is considered an integral part of a complete examination. Limited examinations for reoccurring indications may be performed as noted. The reflux portion of the exam is performed with the patient in reverse Trendelenburg.  +---------+---------------+---------+-----------+----------+--------------+ RIGHT    CompressibilityPhasicitySpontaneityPropertiesThrombus Aging +---------+---------------+---------+-----------+----------+--------------+ CFV      Full           Yes      Yes                                 +---------+---------------+---------+-----------+----------+--------------+ SFJ      Full                                                         +---------+---------------+---------+-----------+----------+--------------+ FV Prox  Full                                                        +---------+---------------+---------+-----------+----------+--------------+ FV Mid  Full                                                        +---------+---------------+---------+-----------+----------+--------------+ FV DistalFull                                                        +---------+---------------+---------+-----------+----------+--------------+ PFV      Full                                                        +---------+---------------+---------+-----------+----------+--------------+ POP      Full           Yes      Yes                                 +---------+---------------+---------+-----------+----------+--------------+ PTV      Full                                                        +---------+---------------+---------+-----------+----------+--------------+ PERO     Full                                                        +---------+---------------+---------+-----------+----------+--------------+   +---------+---------------+---------+-----------+----------+--------------+ LEFT     CompressibilityPhasicitySpontaneityPropertiesThrombus Aging +---------+---------------+---------+-----------+----------+--------------+ CFV      Full           Yes      Yes                                 +---------+---------------+---------+-----------+----------+--------------+ SFJ      Full                                                        +---------+---------------+---------+-----------+----------+--------------+ FV Prox  Full                                                        +---------+---------------+---------+-----------+----------+--------------+ FV Mid   Full                                                         +---------+---------------+---------+-----------+----------+--------------+  FV DistalFull                                                        +---------+---------------+---------+-----------+----------+--------------+ PFV      Full                                                        +---------+---------------+---------+-----------+----------+--------------+ POP      Full           Yes      Yes                                 +---------+---------------+---------+-----------+----------+--------------+ PTV      Full                                                        +---------+---------------+---------+-----------+----------+--------------+ PERO     Full                                                        +---------+---------------+---------+-----------+----------+--------------+     Summary: BILATERAL: - No evidence of deep vein thrombosis seen in the lower extremities, bilaterally. - No evidence of superficial venous thrombosis in the lower extremities, bilaterally. -No evidence of popliteal cyst, bilaterally.   *See table(s) above for measurements and observations. Electronically signed by Heath Lark on 07/15/2020 at 3:49:54 PM.    Final      PERTINENT LAB RESULTS: CBC: Recent Labs    07/28/20 0201  WBC 8.7  HGB 11.4*  HCT 34.5*  PLT 219   CMET CMP     Component Value Date/Time   NA 139 07/28/2020 0201   K 4.1 07/28/2020 0201   CL 102 07/28/2020 0201   CO2 27 07/28/2020 0201   GLUCOSE 120 (H) 07/28/2020 0201   BUN 20 07/28/2020 0201   CREATININE 0.84 07/28/2020 0201   CALCIUM 8.9 07/28/2020 0201   PROT 5.2 (L) 07/24/2020 0136   ALBUMIN 2.6 (L) 07/24/2020 0136   AST 40 07/24/2020 0136   ALT 54 (H) 07/24/2020 0136   ALKPHOS 39 07/24/2020 0136   BILITOT 1.1 07/24/2020 0136   GFRNONAA >60 07/28/2020 0201    GFR Estimated Creatinine Clearance: 76.3 mL/min (by C-G formula based on SCr of 0.84 mg/dL). No results for input(s): LIPASE,  AMYLASE in the last 72 hours. No results for input(s): CKTOTAL, CKMB, CKMBINDEX, TROPONINI in the last 72 hours. Invalid input(s): POCBNP No results for input(s): DDIMER in the last 72 hours. No results for input(s): HGBA1C in the last 72 hours. No results for input(s): CHOL, HDL, LDLCALC, TRIG, CHOLHDL, LDLDIRECT in the last 72 hours. No results for input(s): TSH, T4TOTAL, T3FREE, THYROIDAB in the last 72 hours.  Invalid input(s): FREET3 No results for input(s): VITAMINB12, FOLATE,  FERRITIN, TIBC, IRON, RETICCTPCT in the last 72 hours. Coags: No results for input(s): INR in the last 72 hours.  Invalid input(s): PT Microbiology: No results found for this or any previous visit (from the past 240 hour(s)).  FURTHER DISCHARGE INSTRUCTIONS:  Get Medicines reviewed and adjusted: Please take all your medications with you for your next visit with your Primary MD  Laboratory/radiological data: Please request your Primary MD to go over all hospital tests and procedure/radiological results at the follow up, please ask your Primary MD to get all Hospital records sent to his/her office.  In some cases, they will be blood work, cultures and biopsy results pending at the time of your discharge. Please request that your primary care M.D. goes through all the records of your hospital data and follows up on these results.  Also Note the following: If you experience worsening of your admission symptoms, develop shortness of breath, life threatening emergency, suicidal or homicidal thoughts you must seek medical attention immediately by calling 911 or calling your MD immediately  if symptoms less severe.  You must read complete instructions/literature along with all the possible adverse reactions/side effects for all the Medicines you take and that have been prescribed to you. Take any new Medicines after you have completely understood and accpet all the possible adverse reactions/side effects.   Do not  drive when taking Pain medications or sleeping medications (Benzodaizepines)  Do not take more than prescribed Pain, Sleep and Anxiety Medications. It is not advisable to combine anxiety,sleep and pain medications without talking with your primary care practitioner  Special Instructions: If you have smoked or chewed Tobacco  in the last 2 yrs please stop smoking, stop any regular Alcohol  and or any Recreational drug use.  Wear Seat belts while driving.  Please note: You were cared for by a hospitalist during your hospital stay. Once you are discharged, your primary care physician will handle any further medical issues. Please note that NO REFILLS for any discharge medications will be authorized once you are discharged, as it is imperative that you return to your primary care physician (or establish a relationship with a primary care physician if you do not have one) for your post hospital discharge needs so that they can reassess your need for medications and monitor your lab values.  Total Time spent coordinating discharge including counseling, education and face to face time equals 35 minutes.  SignedJeoffrey Massed 07/28/2020 12:40 PM

## 2020-07-28 NOTE — Progress Notes (Signed)
Shirley Hansen to be D/C'd Skilled nursing facility per MD order.  Discussed with the patient and all questions fully answered.  VSS, Skin clean, dry and intact without evidence of skin break down, no evidence of skin tears noted. IV catheter discontinued intact. Site without signs and symptoms of complications. Dressing and pressure applied.  An After Visit Summary was printed and given to PTAR.  Patient escorted via PTAR.

## 2020-07-28 NOTE — TOC Transition Note (Signed)
Transition of Care [facility]) - CM/SW Discharge Note   Patient Details  Name: Shirley Hansen MRN: [id number] Date of Birth: 16-Mar-1966  Transition of Care [facility]) CM/SW Contact:  Juanmanuel Marohl, LCSWA Phone Number:[phone] 07/28/2020, 1:57 PM   Clinical Narrative:    Patient will DC to: [facility]  Anticipated DC date: 07/28/20 Family notified: Patient to notify family Transport by: Sharin Mons   Per MD patient ready for DC to [facility]. RN to call report prior to discharge [phone] [id number]). RN, patient, and facility notified of DC. Discharge Summary and FL2 sent to facility. DC packet on chart. Ambulance transport requested for patient.   CSW will sign off for now as social work intervention is no longer needed. Please consult [name] again if new needs arise.      Final next level of care: Skilled Nursing Facility Barriers to Discharge: Barriers Resolved   Patient Goals and CMS Choice Patient states their goals for this hospitalization and ongoing recovery are:: Rehab CMS Medicare.gov Compare Post Acute Care list provided to:: Patient Choice offered to / list presented to : Patient  Discharge Placement PASRR number recieved: 07/28/20 Existing PASRR number confirmed : 07/28/20          Patient chooses bed at: [facility] Patient to be transferred to facility by: PTAR Name of family member notified: Patient alerting family Patient and family notified of of transfer: 07/28/20  Discharge Plan and Services In-house Referral: Clinical Social Work   Post Acute Care Choice: Skilled Nursing Facility          DME Arranged: N/A DME Agency: NA       HH Arranged: NA HH Agency: NA        Social Determinants of Health (SDOH) Interventions     Readmission Risk Interventions No flowsheet data found.

## 2020-07-28 NOTE — TOC Progression Note (Addendum)
Transition of Care [facility]) - Progression Note    Patient Details  Name: Asiana Benninger MRN: [id number] Date of Birth: 1966-03-20  Transition of Care [facility]) CM/SW Contact  Mearl Latin, LCSW Phone Number: 07/28/2020, 8:45 AM  Clinical Narrative:    8:45am-CSW requested [facility] review patient's insurance and bed availability.   12pm-[facility] able to accept patient today per Selena Batten. CSW spoke with patient; she is agreeable to bed offer and states she just wants help to get better. MD aware. CSW will arrange for PTAR.    Expected Discharge Plan: Skilled Nursing Facility Barriers to Discharge: SNF Pending bed offer,SNF Covid  Expected Discharge Plan and Services Expected Discharge Plan: Skilled Nursing Facility In-house Referral: Clinical Social Work   Post Acute Care Choice: Skilled Nursing Facility Living arrangements for the past 2 months: Apartment,Single Family Home                                       Social Determinants of Health (SDOH) Interventions    Readmission Risk Interventions No flowsheet data found.

## 2020-07-28 NOTE — Progress Notes (Signed)
Report given to [facility] of [employer].

## 2020-07-30 NOTE — Progress Notes (Signed)
   07/14/20 1643  Assess: MEWS Score  Temp 98.8 F (37.1 C)  BP 114/88  Pulse Rate (!) 121  ECG Heart Rate (!) 122  Resp 15  SpO2 96 %  Assess: MEWS Score  MEWS Temp 0  MEWS Systolic 0  MEWS Pulse 2  MEWS RR 0  MEWS LOC 0  MEWS Score 2  MEWS Score Color Yellow  Assess: if the MEWS score is Yellow or Red  Were vital signs taken at a resting state? Yes  Focused Assessment Change from prior assessment (see assessment flowsheet)  Early Detection of Sepsis Score *See Row Information* Low  MEWS guidelines implemented *See Row Information* Yes  Treat  MEWS Interventions Escalated (See documentation below)  Take Vital Signs  Increase Vital Sign Frequency  Yellow: Q 2hr X 2 then Q 4hr X 2, if remains yellow, continue Q 4hrs  Escalate  MEWS: Escalate Yellow: discuss with charge nurse/RN and consider discussing with provider and RRT  Notify: Charge Nurse/RN  Name of Charge Nurse/RN Notified Erin RN  Date Charge Nurse/RN Notified 07/14/20  Time Charge Nurse/RN Notified 1643  Document  Progress note created (see row info) Yes

## 2021-02-17 ENCOUNTER — Emergency Department (HOSPITAL_COMMUNITY): Payer: Medicaid Other

## 2021-02-17 ENCOUNTER — Other Ambulatory Visit: Payer: Self-pay

## 2021-02-17 ENCOUNTER — Emergency Department (HOSPITAL_COMMUNITY)
Admission: EM | Admit: 2021-02-17 | Discharge: 2021-02-18 | Disposition: A | Discharge status: Home / Self Care | Payer: Medicaid Other | Attending: Emergency Medicine | Admitting: Emergency Medicine

## 2021-02-17 DIAGNOSIS — Z79899 Other long term (current) drug therapy: Secondary | ICD-10-CM | POA: Diagnosis not present

## 2021-02-17 DIAGNOSIS — W010XXA Fall on same level from slipping, tripping and stumbling without subsequent striking against object, initial encounter: Secondary | ICD-10-CM | POA: Insufficient documentation

## 2021-02-17 DIAGNOSIS — M25562 Pain in left knee: Secondary | ICD-10-CM | POA: Insufficient documentation

## 2021-02-17 DIAGNOSIS — Z20822 Contact with and (suspected) exposure to covid-19: Secondary | ICD-10-CM | POA: Diagnosis not present

## 2021-02-17 DIAGNOSIS — M79605 Pain in left leg: Secondary | ICD-10-CM | POA: Diagnosis not present

## 2021-02-17 DIAGNOSIS — I129 Hypertensive chronic kidney disease with stage 1 through stage 4 chronic kidney disease, or unspecified chronic kidney disease: Secondary | ICD-10-CM | POA: Diagnosis not present

## 2021-02-17 DIAGNOSIS — R202 Paresthesia of skin: Secondary | ICD-10-CM | POA: Insufficient documentation

## 2021-02-17 DIAGNOSIS — R000 Tachycardia, unspecified: Secondary | ICD-10-CM | POA: Diagnosis not present

## 2021-02-17 DIAGNOSIS — Z87891 Personal history of nicotine dependence: Secondary | ICD-10-CM | POA: Insufficient documentation

## 2021-02-17 DIAGNOSIS — R531 Weakness: Secondary | ICD-10-CM | POA: Diagnosis not present

## 2021-02-17 DIAGNOSIS — Y900 Blood alcohol level of less than 20 mg/100 ml: Secondary | ICD-10-CM | POA: Diagnosis not present

## 2021-02-17 DIAGNOSIS — Z8616 Personal history of COVID-19: Secondary | ICD-10-CM | POA: Diagnosis not present

## 2021-02-17 DIAGNOSIS — M25512 Pain in left shoulder: Secondary | ICD-10-CM | POA: Diagnosis not present

## 2021-02-17 DIAGNOSIS — N1831 Chronic kidney disease, stage 3a: Secondary | ICD-10-CM | POA: Insufficient documentation

## 2021-02-17 DIAGNOSIS — Z7982 Long term (current) use of aspirin: Secondary | ICD-10-CM | POA: Diagnosis not present

## 2021-02-17 LAB — COMPREHENSIVE METABOLIC PANEL
ALT: 27 U/L (ref 0–44)
AST: 22 U/L (ref 15–41)
Albumin: 3.8 g/dL (ref 3.5–5.0)
Alkaline Phosphatase: 79 U/L (ref 38–126)
Anion gap: 11 (ref 5–15)
BUN: 14 mg/dL (ref 6–20)
CO2: 28 mmol/L (ref 22–32)
Calcium: 9.5 mg/dL (ref 8.9–10.3)
Chloride: 98 mmol/L (ref 98–111)
Creatinine, Ser: 1.04 mg/dL — ABNORMAL HIGH (ref 0.44–1.00)
GFR, Estimated: >60 mL/min (ref >60)
Glucose, Bld: 81 mg/dL (ref 70–99)
Potassium: 3.5 mmol/L (ref 3.5–5.1)
Sodium: 137 mmol/L (ref 135–145)
Total Bilirubin: 0.7 mg/dL (ref 0.3–1.2)
Total Protein: 7.8 g/dL (ref 6.5–8.1)

## 2021-02-17 LAB — CBC
HCT: 41.8 % (ref 36.0–46.0)
Hemoglobin: 13.6 g/dL (ref 12.0–15.0)
MCH: 29.3 pg (ref 26.0–34.0)
MCHC: 32.5 g/dL (ref 30.0–36.0)
MCV: 90.1 fL (ref 80.0–100.0)
Platelets: 398 10*3/uL (ref 150–400)
RBC: 4.64 MIL/uL (ref 3.87–5.11)
RDW: 13.9 % (ref 11.5–15.5)
WBC: 9.2 10*3/uL (ref 4.0–10.5)
nRBC: 0 % (ref 0.0–0.2)

## 2021-02-17 LAB — RESP PANEL BY RT-PCR (FLU A&B, COVID) ARPGX2
Influenza A by PCR: NEGATIVE
Influenza B by PCR: NEGATIVE
SARS Coronavirus 2 by RT PCR: NEGATIVE

## 2021-02-17 LAB — URINALYSIS, ROUTINE W REFLEX MICROSCOPIC
Bilirubin Urine: NEGATIVE
Glucose, UA: NEGATIVE mg/dL
Hgb urine dipstick: NEGATIVE
Ketones, ur: 40 mg/dL — AB
Leukocytes,Ua: NEGATIVE
Nitrite: NEGATIVE
Protein, ur: NEGATIVE mg/dL
Specific Gravity, Urine: 1.02 (ref 1.005–1.030)
pH: 6 (ref 5.0–8.0)

## 2021-02-17 LAB — DIFFERENTIAL
Abs Immature Granulocytes: 0.04 10*3/uL (ref 0.00–0.07)
Basophils Absolute: 0.1 10*3/uL (ref 0.0–0.1)
Basophils Relative: 1 %
Eosinophils Absolute: 0.1 10*3/uL (ref 0.0–0.5)
Eosinophils Relative: 1 %
Immature Granulocytes: 0 %
Lymphocytes Relative: 22 %
Lymphs Abs: 2 10*3/uL (ref 0.7–4.0)
Monocytes Absolute: 0.7 10*3/uL (ref 0.1–1.0)
Monocytes Relative: 8 %
Neutro Abs: 6.2 10*3/uL (ref 1.7–7.7)
Neutrophils Relative %: 68 %

## 2021-02-17 LAB — I-STAT BETA HCG BLOOD, ED (MC, WL, AP ONLY): I-stat hCG, quantitative: 8.1 m[IU]/mL — ABNORMAL HIGH (ref <5)

## 2021-02-17 LAB — I-STAT CHEM 8, ED
BUN: 16 mg/dL (ref 6–20)
Calcium, Ion: 1.17 mmol/L (ref 1.15–1.40)
Chloride: 101 mmol/L (ref 98–111)
Creatinine, Ser: 1 mg/dL (ref 0.44–1.00)
Glucose, Bld: 85 mg/dL (ref 70–99)
HCT: 42 % (ref 36.0–46.0)
Hemoglobin: 14.3 g/dL (ref 12.0–15.0)
Potassium: 3.7 mmol/L (ref 3.5–5.1)
Sodium: 139 mmol/L (ref 135–145)
TCO2: 29 mmol/L (ref 22–32)

## 2021-02-17 LAB — ETHANOL: Alcohol, Ethyl (B): <10 mg/dL (ref <10)

## 2021-02-17 LAB — PROTIME-INR
INR: 1 (ref 0.8–1.2)
Prothrombin Time: 13.4 seconds (ref 11.4–15.2)

## 2021-02-17 LAB — CBG MONITORING, ED: Glucose-Capillary: 77 mg/dL (ref 70–99)

## 2021-02-17 LAB — APTT: aPTT: 32 seconds (ref 24–36)

## 2021-02-17 MED ORDER — MORPHINE SULFATE (PF) 4 MG/ML IV SOLN
4.0000 mg | Freq: Once | INTRAVENOUS | Status: AC
Start: 2021-02-17 — End: 2021-02-17
  Administered 2021-02-17: 4 mg via INTRAVENOUS
  Filled 2021-02-17: qty 1

## 2021-02-17 MED ORDER — ONDANSETRON HCL 4 MG/2ML IJ SOLN
4.0000 mg | Freq: Once | INTRAMUSCULAR | Status: AC
  Administered 2021-02-17: 4 mg via INTRAVENOUS
  Filled 2021-02-17: qty 2

## 2021-02-17 MED ORDER — LISINOPRIL 10 MG PO TABS
10.0000 mg | ORAL_TABLET | Freq: Once | ORAL | Status: AC
  Administered 2021-02-17: 10 mg via ORAL
  Filled 2021-02-17: qty 1

## 2021-02-17 MED ORDER — SODIUM CHLORIDE 0.9 % IV BOLUS
1000.0000 mL | Freq: Once | INTRAVENOUS | Status: AC
  Administered 2021-02-17: 1000 mL via INTRAVENOUS

## 2021-02-17 MED ORDER — DIPHENHYDRAMINE HCL 50 MG/ML IJ SOLN
25.0000 mg | Freq: Once | INTRAMUSCULAR | Status: AC
  Administered 2021-02-17: 25 mg via INTRAVENOUS
  Filled 2021-02-17: qty 1

## 2021-02-17 MED ORDER — METOCLOPRAMIDE HCL 5 MG/ML IJ SOLN
10.0000 mg | Freq: Once | INTRAMUSCULAR | Status: AC
  Administered 2021-02-17: 10 mg via INTRAVENOUS
  Filled 2021-02-17: qty 2

## 2021-02-17 MED ORDER — AMLODIPINE BESYLATE 5 MG PO TABS
5.0000 mg | ORAL_TABLET | Freq: Once | ORAL | Status: AC
  Administered 2021-02-17: 5 mg via ORAL
  Filled 2021-02-17: qty 1

## 2021-02-17 NOTE — ED Notes (Signed)
C/o lt shoulder, neck, and lt knee pain. Started after a fall 5 days ago

## 2021-02-17 NOTE — ED Notes (Signed)
Received verbal report from Bailey B RN at this time 

## 2021-02-17 NOTE — ED Provider Notes (Signed)
[facility] EMERGENCY DEPARTMENT Provider Note   CSN: [id number] Arrival date & time: 02/17/21  0912     History Chief Complaint  Patient presents with   Marletta Lor    Shirley Hansen is a 55 y.o. female.  Patient presents today for ongoing pain following a fall 6 days ago. 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 She states she fell six days ago when standing to use her bedside commode, and feels like her legs gave out from under her. She fell onto her left side and landed primarily on her left shoulder and back. She denies LOC or hitting her head. She has had pain to her left shoulder that was worse this morning when she tried to push herself up in bed.  Also complains of some knee pain.  Since the fall she has been unable to get herself to her bedside commode without assistance.  She also states she has increased burning pain and tingling in her left upper and lower extremities from baseline since the fall.  She moved one week ago to live with family members. She denies fever, chills, headache, lightheadedness, chest pain, SOB, cough, abdominal pain, nausea, vomiting.       Past Medical History:  Diagnosis Date   Hypertension    Pancreatitis    Stage 3a chronic kidney disease (HCC)    Stroke [facility])     Patient Active Problem List   Diagnosis Date Noted   COVID-19 07/14/2020   Stroke [facility]    Stage 3a chronic kidney disease (HCC)    Hypertension     Past Surgical History:  Procedure Laterality Date   TUBAL LIGATION       OB History   No obstetric history on file.     No family history on file.  Social History   Tobacco Use   Smoking status: Former   Smokeless tobacco: Never  Substance Use Topics   Alcohol use: Never   Drug use: Never    Home Medications Prior to Admission medications   Medication Sig Start Date End Date Taking? Authorizing Provider  amitriptyline  (ELAVIL) 25 MG tablet Take 25 mg by mouth at bedtime. 02/07/21 02/07/22 Yes [provider]  atorvastatin (LIPITOR) 80 MG tablet Take 80 mg by mouth at bedtime. 02/07/21 02/07/22 Yes [provider]  DULoxetine (CYMBALTA) 60 MG capsule Take 60 mg by mouth daily. 02/07/21 02/07/22 Yes [provider]  hydrochlorothiazide (HYDRODIURIL) 25 MG tablet Take 25 mg by mouth daily. 02/07/21  Yes [provider]  Netarsudil Dimesylate 0.02 % SOLN Place 1 drop into both eyes at bedtime. 02/07/21  Yes [provider]  oxyCODONE (OXY IR/ROXICODONE) 5 MG immediate release tablet Take 5 mg by mouth every 6 (six) hours as needed for pain. 02/07/21  Yes [provider]  albuterol (VENTOLIN HFA) 108 (90 Base) MCG/ACT inhaler Inhale 2 puffs into the lungs every 2 (two) hours as needed for wheezing or shortness of breath. 07/28/20   Ghimire, Werner Lean, MD  amLODipine (NORVASC) 10 MG tablet Take 1 tablet (10 mg total) by mouth daily. Patient not taking: Reported on 02/17/2021 07/29/20   Maretta Bees, MD  amLODipine (NORVASC) 5 MG tablet Take 5 mg by mouth daily. 02/07/21   [provider]  Artificial Tear Solution (GENTEAL TEARS OP) Place 1 drop into both eyes daily as needed (dry eyes).    [provider]  aspirin  EC 81 MG tablet Take 81 mg by mouth daily. Swallow whole.    [provider]  baclofen (LIORESAL) 10 MG tablet Take 10 mg by mouth 4 (four) times daily. 01/28/21   [provider]  brimonidine (ALPHAGAN) 0.2 % ophthalmic solution Place 1 drop into both eyes 2 (two) times daily. 06/26/20   [provider]  carvedilol (COREG) 25 MG tablet Take 25 mg by mouth 2 (two) times daily. 02/07/21   [provider]  colchicine 0.6 MG tablet Take 1 tablet (0.6 mg total) by mouth daily. 07/29/20   Ghimire, Werner Lean, MD  diclofenac Sodium (VOLTAREN) 1 % GEL Apply 1 application topically 4 (four) times daily as needed (pain).  05/20/20   [provider]  dorzolamide-timolol (COSOPT) 22.3-6.8 MG/ML ophthalmic solution Place 1 drop into both eyes 2 (two) times daily. 05/31/20   [provider]  escitalopram (LEXAPRO) 5 MG tablet Take 5 mg by mouth daily. 01/28/21   [provider]  feeding supplement (ENSURE ENLIVE / ENSURE PLUS) LIQD Take 237 mLs by mouth 2 (two) times daily between meals. 07/28/20   Ghimire, Werner Lean, MD  gabapentin (NEURONTIN) 400 MG capsule Take 400 mg by mouth 2 (two) times daily. 01/28/21   [provider]  gabapentin (NEURONTIN) 600 MG tablet Take 600 mg by mouth at bedtime. 01/28/21   [provider]  lamoTRIgine (LAMICTAL) 25 MG tablet Take 25 mg by mouth daily. 02/08/21   [provider]  latanoprost (XALATAN) 0.005 % ophthalmic solution Place 1 drop into both eyes at bedtime. 06/17/20   [provider]  lisinopril (ZESTRIL) 10 MG tablet Take 10 mg by mouth daily. 01/28/21   [provider]  loratadine (CLARITIN) 10 MG tablet Take 1 tablet (10 mg total) by mouth daily. 07/29/20   Ghimire, Werner Lean, MD  Multiple Vitamin (MULTIVITAMIN WITH MINERALS) TABS tablet Take 1 tablet by mouth daily. 07/29/20   Ghimire, Werner Lean, MD  Nutritional Supplements (FEEDING SUPPLEMENT, NEPRO CARB STEADY,) LIQD Take 237 mLs by mouth 3 (three) times daily as needed (Supplement). 07/28/20   Ghimire, Werner Lean, MD  pantoprazole (PROTONIX) 40 MG tablet Take 1 tablet (40 mg total) by mouth daily. 07/29/20   Ghimire, Werner Lean, MD  polyethylene glycol (MIRALAX / GLYCOLAX) 17 g packet Take 17 g by mouth daily as needed for mild constipation. 07/28/20   Ghimire, Werner Lean, MD  potassium chloride SA (KLOR-CON) 20 MEQ tablet Take 40 mEq by mouth daily. 01/28/21   [provider]  pregabalin (LYRICA) 50 MG capsule Take 50 mg by mouth 2 (two) times daily. 01/28/21   [provider]  tiZANidine (ZANAFLEX) 2 MG tablet Take 2 mg by mouth every 8 (eight)  hours as needed for muscle spasms. 01/28/21   [provider]    Allergies    Patient has no known allergies.  Review of Systems   Review of Systems  Constitutional:  Negative for fever.  HENT:  Negative for rhinorrhea and sore throat.   Eyes:  Negative for redness.  Respiratory:  Negative for cough.   Cardiovascular:  Negative for chest pain.  Gastrointestinal:  Negative for abdominal pain, diarrhea, nausea and vomiting.  Genitourinary:  Negative for dysuria, frequency, hematuria and urgency.  Musculoskeletal:  Positive for arthralgias and myalgias.  Skin:  Negative for rash.  Neurological:  Positive for weakness (Baseline from previous stroke) and numbness. Negative for headaches.   Physical Exam Updated Vital Signs BP (!) 142/105  Pulse (!) 122   Temp 98.7 F (37.1 C) (Oral)   Resp (!) 22   Ht  (1.626 m)   Wt 89.8 kg   SpO2 97%   BMI 33.99 kg/m   Physical Exam Vitals and nursing note reviewed.  Constitutional:      General: She is not in acute distress.    Appearance: She is well-developed.  HENT:     Head: Normocephalic and atraumatic.     Right Ear: External ear normal.     Left Ear: External ear normal.     Nose: Nose normal.  Eyes:     Conjunctiva/sclera: Conjunctivae normal.  Cardiovascular:     Rate and Rhythm: Regular rhythm. Tachycardia present.     Heart sounds: No murmur heard. Pulmonary:     Effort: No respiratory distress.     Breath sounds: No wheezing, rhonchi or rales.  Abdominal:     Palpations: Abdomen is soft.     Tenderness: There is no abdominal tenderness. There is no guarding or rebound.  Musculoskeletal:     Cervical back: Normal range of motion and neck supple.     Right lower leg: No edema.     Left lower leg: No edema.     Comments: Left shoulder: Patient with pain and tenderness to palpation to the left posterior shoulder.  Patient winces when I press medially.  Skin:    General: Skin is warm and dry.      Findings: No rash.  Neurological:     General: No focal deficit present.     Mental Status: She is alert. Mental status is at baseline.     Comments: Baseline upper extremity weakness.  Psychiatric:        Mood and Affect: Mood normal.    ED Results / Procedures / Treatments   Labs (all labs ordered are listed, but only abnormal results are displayed) Labs Reviewed  COMPREHENSIVE METABOLIC PANEL - Abnormal; Notable for the following components:      Result Value   Creatinine, Ser 1.04 (*)    All other components within normal limits  URINALYSIS, ROUTINE W REFLEX MICROSCOPIC - Abnormal; Notable for the following components:   Ketones, ur 40 (*)    All other components within normal limits  I-STAT BETA HCG BLOOD, ED ([facility], [facility], [facility] ONLY) - Abnormal; Notable for the following components:   I-stat hCG, quantitative 8.1 (*)    All other components within normal limits  RESP PANEL BY RT-PCR (FLU A&B, COVID) ARPGX2  ETHANOL  PROTIME-INR  APTT  CBC  DIFFERENTIAL  RAPID URINE DRUG SCREEN, HOSP PERFORMED  I-STAT CHEM 8, ED  CBG MONITORING, ED    ED ECG REPORT   Date: 02/17/2021  Rate: 112  Rhythm: sinus tachycardia  QRS Axis: left  Intervals: normal  ST/T Wave abnormalities: normal  Conduction Disutrbances:right bundle branch block and left anterior fascicular block  Narrative Interpretation:   Old EKG Reviewed: unchanged  I have personally reviewed the EKG tracing and agree with the computerized printout as noted.   Radiology CT HEAD WO CONTRAST  Result Date: 02/17/2021 CLINICAL DATA:  Trip and fall 5 days ago, intermittent LEFT-sided weakness in a 55 year old female. EXAM: CT HEAD WITHOUT CONTRAST TECHNIQUE: Contiguous axial images were obtained from the base of the skull through the vertex without intravenous contrast. COMPARISON:  July 05, 2020. FINDINGS: Brain: No evidence of acute infarction, hemorrhage, hydrocephalus, extra-axial collection or mass lesion/mass effect.  Signs of atrophy and prior  hemorrhage and/or infarct in the RIGHT thalamic region tracking into corona radiata with similar appearance. Atrophy and chronic microvascular ischemic changes are otherwise similar to the prior study with chronic lacunar infarcts in the LEFT corona radiata. Vascular: No hyperdense vessel or unexpected calcification. Skull: Normal. Negative for fracture or focal lesion. Sinuses/Orbits: Visualized paranasal sinuses and orbits are unremarkable. Other: None IMPRESSION: No acute intracranial process. Signs of atrophy and chronic microvascular ischemic changes. Signs of prior, prior insult to the RIGHT thalamic capsular junction and evidence of LEFT-sided lacunar infarcts as on the prior study. Electronically Signed   By: Donzetta KohutGeoffrey  Wile M.D.   On: 02/17/2021 11:12   DG Shoulder Left  Result Date: 02/17/2021 CLINICAL DATA:  Left shoulder pain after fall. EXAM: LEFT SHOULDER - 2+ VIEW COMPARISON:  None. FINDINGS: There is no evidence of fracture or dislocation. There is no evidence of significant arthropathy or other focal bone abnormality. Soft tissues are unremarkable. IMPRESSION: No acute osseous abnormality. Electronically Signed   By: Maudry MayhewJeffrey  Waltz M.D.   On: 02/17/2021 18:21   DG Knee Complete 4 Views Left  Result Date: 02/17/2021 CLINICAL DATA:  Pain after fall. EXAM: LEFT KNEE - COMPLETE 4+ VIEW COMPARISON:  None. FINDINGS: No evidence of fracture, dislocation, or joint effusion. No evidence of arthropathy or other focal bone abnormality. Soft tissues are unremarkable. IMPRESSION: Negative. Electronically Signed   By: Charlett NoseKevin  Dover M.D.   On: 02/17/2021 19:50    Procedures Procedures   Medications Ordered in ED Medications  sodium chloride 0.9 % bolus 1,000 mL (0 mLs Intravenous Stopped 02/17/21 2140)  ondansetron (ZOFRAN) injection 4 mg (4 mg Intravenous Given 02/17/21 2018)  morphine 4 MG/ML injection 4 mg (4 mg Intravenous Given 02/17/21 2018)  amLODipine (NORVASC) tablet 5  mg (5 mg Oral Given 02/17/21 2005)  lisinopril (ZESTRIL) tablet 10 mg (10 mg Oral Given 02/17/21 2004)  metoCLOPramide (REGLAN) injection 10 mg (10 mg Intravenous Given 02/17/21 2206)  diphenhydrAMINE (BENADRYL) injection 25 mg (25 mg Intravenous Given 02/17/21 2207)  sodium chloride 0.9 % bolus 1,000 mL (0 mLs Intravenous Stopped 02/18/21 0007)    ED Course  I have reviewed the triage vital signs and the nursing notes.  Pertinent labs & imaging results that were available during my care of the patient were reviewed by me and considered in my medical decision making (see chart for details).  Patient seen and examined. Work-up ordered in triage reviewed.  Reassuring overall.  EKG demonstrates sinus tachycardia.  Head CT with out obvious acute findings. Will obtain L shoulder x-ray, treat pain, give IV fluids.   Vital signs reviewed and are as follows: BP (!) 149/106   Pulse (!) 123   Temp 98.7 F (37.1 C) (Oral)   Resp (!) 21   Ht 5\' 4"  (1.626 m)   Wt 89.8 kg   SpO2 95%   BMI 33.99 kg/m   Pt discussed with and seen by Dr. Jeraldine LootsLockwood. X-ray knee ordered as well.   9:59 PM Discussed with RN. Fluids just completed. Pt c/o HA. Migraine cocktail ordered. Remains tachycardic but improving. For this reason, second liter bolus ordered.   Spent time reviewing some recent labs. She did have thyroid studies a month ago which were normal.   12:19 AM patient clinically improving during ED stay.  Pulse rate down to 105 after second liter of saline.  Discussed with Dr. Jeraldine LootsLockwood.  Plan for discharged home.  Strongly encouraged PCP follow-up for her residual symptoms.    MDM  Rules/Calculators/A&P                           Patient here after a fall several days ago with ongoing shoulder knee pain, generalized weakness.  Patient has residual weakness from previous stroke.  This seems to be at baseline.  Head CT with no acute findings.  X-rays negative.  Lab work-up reassuring.  Patient was tachycardic  during ED evaluation, however this was much improved with IV fluids suggesting element of dehydration.  No fevers or signs of infection.  Recent thyroid studies were negative.   Final Clinical Impression(s) / ED Diagnoses Final diagnoses:  Weakness  Acute pain of left shoulder  Acute pain of left knee    Rx / DC Orders ED Discharge Orders     None        Renne Crigler, Cordelia Poche 02/18/21 Marco Collie, MD 02/23/21 1745

## 2021-02-17 NOTE — ED Notes (Signed)
Pt states she is unable to walk due to a fall on 02/12/21. States "my feet feel like they are numb, like they are asleep." And that she has knee pain.

## 2021-02-17 NOTE — ED Provider Notes (Signed)
Emergency Medicine Provider Triage Evaluation Note  Shirley Hansen , a 55 y.o. female  was evaluated in triage.  Pt complains of left-sided numbness and weakness.  Patient had a fall on September 3, she had some low back pain but never followed up on it.  Starting last night at 830 she had a headache that started gradually and has been getting worse over the night.  She also has numbness and tingling in her left sided extremities.  Reports feeling generalized weakness and numbness to the left side exclusively.  History of heart attack in 2017, no history of strokes.  She is not on any blood thinners.  Last known normal was at 8:30 PM   Review of Systems  Positive:          Left-sided weakness, headache, numbness, tingling Negative:         Chest pain, shortness of breath   Physical Exam  BP 103/61 (BP Location: Right Arm)   Pulse 79   Temp 98.5 F (36.9 C) (Oral)   Resp 14   LMP 01/05/2016   SpO2 97%  Gen:                Awake, no distress   Resp:               Normal effort  MSK:               Moves extremities without difficulty  Other:              No dysarthria, cranial nerves III through XII are grossly intact.  She does have slight decrease strength in left grip and left leg..  Medical Decision Making  Medically screening exam initiated at 9:22 AM.  Appropriate orders placed.  Elyssia Strausser was informed that the remainder of the evaluation will be completed by another provider, this initial triage assessment does not replace that evaluation, and the importance of remaining in the ED until their evaluation is complete.  Outside of stroke window.  Possible TIA/stroke presentation.  Will initiate work-up, not a code stroke   Theron Arista, [place] 02/17/21 [id number]    Tegeler, Canary Brim, MD 02/17/21 1113

## 2021-02-17 NOTE — ED Triage Notes (Signed)
Reported tripped and fall 5 days ago; d/t her legs giving out; c/o intermittent left side tingling since then along w/ headache.

## 2021-02-17 NOTE — ED Notes (Signed)
Patient transported to X-ray 

## 2021-02-18 NOTE — ED Notes (Signed)
Spoke with pt about her being up for d/c. Per pt she needed transportation home that she didn't have anyway home. Advised pt that they only means of transportation that we would have is [facility] and that there was an undetermined wait time for them. Pt verbalized understanding. Charge nurse made aware. Spoke to Diplomatic Services operational officer requesting her to make the request

## 2021-02-18 NOTE — Discharge Instructions (Addendum)
Please read and follow all provided instructions.  Your diagnoses today include:  1. Weakness   2. Acute pain of left shoulder   3. Acute pain of left knee     Tests performed today include: CT of the head -shows old stroke X-ray of the shoulder and knee -did not show any broken bones Blood testing -no signs of infection or other major issues today Vital signs. See below for your results today.   Medications prescribed:  None  Take any prescribed medications only as directed.  Home care instructions:  Follow any educational materials contained in this packet.  BE VERY CAREFUL not to take multiple medicines containing Tylenol (also called acetaminophen). Doing so can lead to an overdose which can damage your liver and cause liver failure and possibly death.   Follow-up instructions: Please follow-up with your primary care provider in the next 2 days for further evaluation of your symptoms.   Return instructions:  Please return to the Emergency Department if you experience worsening symptoms.  Return if you have weakness in your arms or legs, slurred speech, trouble walking or talking, confusion, or trouble with your balance.  Please return if you have any other emergent concerns.  Additional Information:  Your vital signs today were: BP (!) 128/91   Pulse (!) 105   Temp 98.9 F (37.2 C) (Oral)   Resp 18   Ht 5\' 4"  (1.626 m)   Wt 89.8 kg   SpO2 94%   BMI 33.99 kg/m  If your blood pressure (BP) was elevated above 135/85 this visit, please have this repeated by your doctor within one month. --------------

## 2021-02-18 NOTE — ED Notes (Signed)
[facility] arrived for transport 

## 2021-02-18 NOTE — ED Notes (Signed)
Attempted to dress pt in her on clothing and was asked by pt could she just wear the hosp gown because her clothing got wet. Noted that her clothing was wet. Continues to wait on [facility] for transportation home.

## 2021-02-22 ENCOUNTER — Emergency Department (HOSPITAL_COMMUNITY): Payer: Medicaid Other

## 2021-02-22 ENCOUNTER — Encounter (HOSPITAL_COMMUNITY): Payer: Self-pay | Admitting: Emergency Medicine

## 2021-02-22 ENCOUNTER — Other Ambulatory Visit: Payer: Self-pay

## 2021-02-22 ENCOUNTER — Emergency Department (HOSPITAL_COMMUNITY)
Admission: EM | Admit: 2021-02-22 | Discharge: 2021-02-23 | Disposition: A | Discharge status: Home / Self Care | Payer: Medicaid Other | Attending: Emergency Medicine | Admitting: Emergency Medicine

## 2021-02-22 DIAGNOSIS — H8110 Benign paroxysmal vertigo, unspecified ear: Secondary | ICD-10-CM | POA: Insufficient documentation

## 2021-02-22 DIAGNOSIS — I10 Essential (primary) hypertension: Secondary | ICD-10-CM | POA: Diagnosis not present

## 2021-02-22 DIAGNOSIS — G609 Hereditary and idiopathic neuropathy, unspecified: Secondary | ICD-10-CM | POA: Diagnosis not present

## 2021-02-22 DIAGNOSIS — G629 Polyneuropathy, unspecified: Secondary | ICD-10-CM

## 2021-02-22 DIAGNOSIS — Z87891 Personal history of nicotine dependence: Secondary | ICD-10-CM | POA: Diagnosis not present

## 2021-02-22 DIAGNOSIS — E041 Nontoxic single thyroid nodule: Secondary | ICD-10-CM

## 2021-02-22 DIAGNOSIS — E042 Nontoxic multinodular goiter: Secondary | ICD-10-CM | POA: Diagnosis not present

## 2021-02-22 DIAGNOSIS — R42 Dizziness and giddiness: Secondary | ICD-10-CM

## 2021-02-22 LAB — BASIC METABOLIC PANEL
Anion gap: 11 (ref 5–15)
BUN: 12 mg/dL (ref 6–20)
CO2: 28 mmol/L (ref 22–32)
Calcium: 9.6 mg/dL (ref 8.9–10.3)
Chloride: 102 mmol/L (ref 98–111)
Creatinine, Ser: 1.14 mg/dL — ABNORMAL HIGH (ref 0.44–1.00)
GFR, Estimated: 57 mL/min — ABNORMAL LOW (ref >60)
Glucose, Bld: 98 mg/dL (ref 70–99)
Potassium: 3.5 mmol/L (ref 3.5–5.1)
Sodium: 141 mmol/L (ref 135–145)

## 2021-02-22 LAB — CBC
HCT: 40.7 % (ref 36.0–46.0)
Hemoglobin: 13.2 g/dL (ref 12.0–15.0)
MCH: 29.3 pg (ref 26.0–34.0)
MCHC: 32.4 g/dL (ref 30.0–36.0)
MCV: 90.2 fL (ref 80.0–100.0)
Platelets: 380 10*3/uL (ref 150–400)
RBC: 4.51 MIL/uL (ref 3.87–5.11)
RDW: 14.5 % (ref 11.5–15.5)
WBC: 7.9 10*3/uL (ref 4.0–10.5)
nRBC: 0 % (ref 0.0–0.2)

## 2021-02-22 LAB — I-STAT BETA HCG BLOOD, ED (MC, WL, AP ONLY): I-stat hCG, quantitative: 10.4 m[IU]/mL — ABNORMAL HIGH (ref <5)

## 2021-02-22 LAB — TROPONIN I (HIGH SENSITIVITY)
Troponin I (High Sensitivity): 6 ng/L (ref <18)
Troponin I (High Sensitivity): 6 ng/L (ref <18)

## 2021-02-22 MED ORDER — MECLIZINE HCL 25 MG PO TABS: 25.0000 mg | ORAL_TABLET | Freq: Three times a day (TID) | ORAL | 0 refills | Status: DC | PRN

## 2021-02-22 MED ORDER — SODIUM CHLORIDE 0.9 % IV BOLUS
500.0000 mL | Freq: Once | INTRAVENOUS | Status: AC
  Administered 2021-02-22: 500 mL via INTRAVENOUS

## 2021-02-22 MED ORDER — MECLIZINE HCL 25 MG PO TABS
25.0000 mg | ORAL_TABLET | Freq: Once | ORAL | Status: AC
  Administered 2021-02-22: 25 mg via ORAL
  Filled 2021-02-22: qty 1

## 2021-02-22 MED ORDER — HYDROCODONE-ACETAMINOPHEN 5-325 MG PO TABS
1.0000 | ORAL_TABLET | Freq: Once | ORAL | Status: AC
  Administered 2021-02-22: 1 via ORAL
  Filled 2021-02-22: qty 1

## 2021-02-22 NOTE — ED Triage Notes (Addendum)
Pt arrives via EMS for left sided nerve pain from a recent fall. Pt was evaluated last week for the fall. Pt has left sided deficits from previous stroke. No new symptoms. Pt is able to ambulate with assistance. Pt complains of dizzy over the past few days and chest tightness. BP 135/83, HR 96, 100% on room air

## 2021-02-22 NOTE — ED Notes (Signed)
Patient transported to X-ray 

## 2021-02-22 NOTE — ED Notes (Signed)
Patient c/o dizziness with headache and feeling of "burning" sensation all over her body that started this morning.

## 2021-02-22 NOTE — ED Provider Notes (Signed)
[facility] EMERGENCY DEPARTMENT Provider Note   CSN: [id number] Arrival date & time: 02/22/21  1130     History Chief Complaint  Patient presents with   Dizziness    Shirley Hansen is a 55 y.o. female.   Dizziness   Pt has history of prior stroke resulting in chronic left side deficits.  Pt recently moved to a new home.  SHe ended up falling because her left leg gave out when trying to walk about a week ago.  She ended up having a headache and bodyaches so she came to the ED.  Evaluation was reassuring.  Pt went home but then today she started having a burning sensation in her face and back.  She also has noticed dizziness primarily when she is looking down and moving her head.  SHe also has some burning in her ear.   No fevers.  No vomiting or diarrhea.  Past Medical History:  Diagnosis Date   Hypertension    Pancreatitis    Stage 3a chronic kidney disease (HCC)    Stroke [facility])     Patient Active Problem List   Diagnosis Date Noted   COVID-19 07/14/2020   Stroke [facility])    Stage 3a chronic kidney disease (HCC)    Hypertension     Past Surgical History:  Procedure Laterality Date   TUBAL LIGATION       OB History   No obstetric history on file.     No family history on file.  Social History   Tobacco Use   Smoking status: Former   Smokeless tobacco: Never  Substance Use Topics   Alcohol use: Never   Drug use: Never    Home Medications Prior to Admission medications   Medication Sig Start Date End Date Taking? Authorizing Provider  albuterol (VENTOLIN HFA) 108 (90 Base) MCG/ACT inhaler Inhale 2 puffs into the lungs every 2 (two) hours as needed for wheezing or shortness of breath. 07/28/20  Yes Ghimire, Werner Lean, MD  amitriptyline (ELAVIL) 25 MG tablet Take 25 mg by mouth daily at 6 (six) AM. 02/07/21 02/07/22 Yes [provider]  amLODipine (NORVASC) 5 MG tablet Take 5 mg by mouth daily. 02/07/21  Yes [provider]   Artificial Tear Solution (GENTEAL TEARS OP) Place 1 drop into both eyes daily as needed (dry eyes).   Yes [provider]  aspirin EC 81 MG tablet Take 81 mg by mouth daily. Swallow whole.   Yes [provider]  atorvastatin (LIPITOR) 80 MG tablet Take 80 mg by mouth at bedtime. 02/07/21 02/07/22 Yes [provider]  baclofen (LIORESAL) 10 MG tablet Take 10 mg by mouth 4 (four) times daily. 01/28/21  Yes [provider]  brimonidine (ALPHAGAN) 0.2 % ophthalmic solution Place 1 drop into both eyes 2 (two) times daily. 06/26/20  Yes [provider]  carvedilol (COREG) 25 MG tablet Take 25 mg by mouth 2 (two) times daily. 02/07/21  Yes [provider]  diclofenac Sodium (VOLTAREN) 1 % GEL Apply 1 application topically 4 (four) times daily as needed (pain). 05/20/20  Yes [provider]  dorzolamide-timolol (COSOPT) 22.3-6.8 MG/ML ophthalmic solution Place 1 drop into both eyes 2 (two) times daily. 05/31/20  Yes [provider]  latanoprost (XALATAN) 0.005 % ophthalmic solution Place 1 drop into both eyes at bedtime. 06/17/20  Yes [provider]  meclizine (ANTIVERT) 25 MG tablet Take 1 tablet (25 mg total) by mouth 3 (three) times daily as  needed for dizziness. 02/22/21  Yes Linwood Dibbles, MD  amLODipine (NORVASC) 10 MG tablet Take 1 tablet (10 mg total) by mouth daily. Patient not taking: Reported on 02/22/2021 07/29/20   Maretta Bees, MD  colchicine 0.6 MG tablet Take 1 tablet (0.6 mg total) by mouth daily. Patient not taking: Reported on 02/22/2021 07/29/20   Maretta Bees, MD  DULoxetine (CYMBALTA) 60 MG capsule Take 60 mg by mouth daily. 02/07/21 02/07/22  [provider]  escitalopram (LEXAPRO) 5 MG tablet Take 5 mg by mouth daily. 01/28/21   [provider]  feeding supplement (ENSURE ENLIVE / ENSURE PLUS) LIQD Take 237 mLs by mouth 2 (two) times daily between meals. 07/28/20   Ghimire, Werner Lean, MD   gabapentin (NEURONTIN) 400 MG capsule Take 400 mg by mouth 2 (two) times daily. 01/28/21   [provider]  gabapentin (NEURONTIN) 600 MG tablet Take 600 mg by mouth at bedtime. 01/28/21   [provider]  hydrochlorothiazide (HYDRODIURIL) 25 MG tablet Take 25 mg by mouth daily. 02/07/21   [provider]  lamoTRIgine (LAMICTAL) 25 MG tablet Take 25 mg by mouth daily. 02/08/21   [provider]  lisinopril (ZESTRIL) 10 MG tablet Take 10 mg by mouth daily. 01/28/21   [provider]  loratadine (CLARITIN) 10 MG tablet Take 1 tablet (10 mg total) by mouth daily. 07/29/20   Ghimire, Werner Lean, MD  Multiple Vitamin (MULTIVITAMIN WITH MINERALS) TABS tablet Take 1 tablet by mouth daily. 07/29/20   Ghimire, Werner Lean, MD  Netarsudil Dimesylate 0.02 % SOLN Place 1 drop into both eyes at bedtime. 02/07/21   [provider]  Nutritional Supplements (FEEDING SUPPLEMENT, NEPRO CARB STEADY,) LIQD Take 237 mLs by mouth 3 (three) times daily as needed (Supplement). 07/28/20   Ghimire, Werner Lean, MD  oxyCODONE (OXY IR/ROXICODONE) 5 MG immediate release tablet Take 5 mg by mouth every 6 (six) hours as needed for pain. 02/07/21   [provider]  pantoprazole (PROTONIX) 40 MG tablet Take 1 tablet (40 mg total) by mouth daily. 07/29/20   Ghimire, Werner Lean, MD  polyethylene glycol (MIRALAX / GLYCOLAX) 17 g packet Take 17 g by mouth daily as needed for mild constipation. 07/28/20   Ghimire, Werner Lean, MD  potassium chloride SA (KLOR-CON) 20 MEQ tablet Take 40 mEq by mouth daily. 01/28/21   [provider]  pregabalin (LYRICA) 50 MG capsule Take 50 mg by mouth 2 (two) times daily. 01/28/21   [provider]  tiZANidine (ZANAFLEX) 2 MG tablet Take 2 mg by mouth every 8 (eight) hours as needed for muscle spasms. 01/28/21   [provider]    Allergies    Patient has no known allergies.  Review of Systems   Review of Systems  Neurological:   Positive for dizziness.   Physical Exam Updated Vital Signs BP (!) 125/98 (BP Location: Right Arm)   Pulse 99   Temp 98.6 F (37 C) (Oral)   Resp 18   Ht 1.626 m (5\' 4" )   Wt 90 kg   SpO2 95%   BMI 34.06 kg/m   Physical Exam  ED Results / Procedures / Treatments   Labs (all labs ordered are listed, but only abnormal results are displayed) Labs Reviewed  BASIC METABOLIC PANEL - Abnormal; Notable for the following components:      Result Value   Creatinine, Ser 1.14 (*)    GFR, Estimated 57 (*)    All other components  within normal limits  I-STAT BETA HCG BLOOD, ED ([facility], [facility], [facility] ONLY) - Abnormal; Notable for the following components:   I-stat hCG, quantitative 10.4 (*)    All other components within normal limits  CBC  TROPONIN I (HIGH SENSITIVITY)  TROPONIN I (HIGH SENSITIVITY)    EKG EKG Interpretation  Date/Time:  Tuesday February 22 2021 11:36:15 EDT Ventricular Rate:  92 PR Interval:  138 QRS Duration: 136 QT Interval:  368 QTC Calculation: 455 R Axis:   -38 Text Interpretation: Normal sinus rhythm Left axis deviation Right bundle branch block Abnormal ECG Since last tracing rate faster Confirmed by Linwood Dibbles [phone]) on 02/22/2021 8:37:46 PM  Radiology DG Chest 2 View  Result Date: 02/22/2021 CLINICAL DATA:  Chest pain, left-sided weakness EXAM: CHEST - 2 VIEW COMPARISON:  Chest radiograph 07/15/2020 FINDINGS: The cardiomediastinal silhouette is normal. Lung volumes are mildly low. There is no focal consolidation or pulmonary edema. There is no pleural effusion or pneumothorax. There is no acute osseous abnormality. IMPRESSION: No radiographic evidence of acute cardiopulmonary process. Electronically Signed   By: Lesia Hausen M.D.   On: 02/22/2021 12:14   DG Thoracic Spine 2 View  Result Date: 02/22/2021 CLINICAL DATA:  Larey Seat, pain EXAM: THORACIC SPINE 2 VIEWS COMPARISON:  None. FINDINGS: Frontal and lateral views of the thoracic spine are obtained. Alignment is  anatomic. There are no acute displaced fractures. Mild lower thoracic spondylosis is noted. Paraspinal soft tissues are unremarkable. IMPRESSION: 1. Mild lower thoracic spondylosis.  No acute bony abnormality. Electronically Signed   By: Sharlet Salina M.D.   On: 02/22/2021 22:37   DG Lumbar Spine Complete  Result Date: 02/22/2021 CLINICAL DATA:  Larey Seat, left-sided pain EXAM: LUMBAR SPINE - COMPLETE 4+ VIEW COMPARISON:  None. FINDINGS: Frontal, bilateral oblique, lateral views of the lumbar spine are obtained. There are 5 non-rib-bearing lumbar type vertebral bodies identified. Left convex scoliosis is centered at L3-4. Otherwise alignment is anatomic. No acute fractures. Moderate spondylosis throughout the lumbar spine greatest at L3-4 and L4-5. There is diffuse facet hypertrophy. Sacroiliac joints are unremarkable. IMPRESSION: 1. Moderate spondylosis and facet hypertrophy.  No acute fracture. 2. Mild left convex scoliosis. Electronically Signed   By: Sharlet Salina M.D.   On: 02/22/2021 22:38   CT Cervical Spine Wo Contrast  Result Date: 02/22/2021 CLINICAL DATA:  Neck trauma EXAM: CT CERVICAL SPINE WITHOUT CONTRAST TECHNIQUE: Multidetector CT imaging of the cervical spine was performed without intravenous contrast. Multiplanar CT image reconstructions were also generated. COMPARISON:  None. FINDINGS: Alignment: Reversal of the normal cervical lordosis, likely positional. Skull base and vertebrae: No acute fracture. No primary bone lesion or focal pathologic process. Soft tissues and spinal canal: No prevertebral fluid or swelling. No visible canal hematoma. Disc levels: Mild degenerative changes of the mid/lower cervical spine. Spinal canal is patent. Upper chest: Visualized lung apices are clear. Other: Visualized thyroid is notable for a 16 mm right thyroid nodule (series 5/image 69). IMPRESSION: No evidence of traumatic injury to the cervical spine. Mild degenerative changes. 16 mm right thyroid nodule.  Recommend thyroid US (ref: J Am Coll Radiol. 2015 Feb;12(2): 143-50). Electronically Signed   By: Charline Bills M.D.   On: 02/22/2021 21:55    Procedures Procedures   Medications Ordered in ED Medications  HYDROcodone-acetaminophen (NORCO/VICODIN) 5-325 MG per tablet 1 tablet (1 tablet Oral Given 02/22/21 2330)  meclizine (ANTIVERT) tablet 25 mg (25 mg Oral Given 02/22/21 2330)  sodium chloride 0.9 % bolus 500 mL (500 mLs  Intravenous New Bag/Given 02/22/21 2331)    ED Course  I have reviewed the triage vital signs and the nursing notes.  Pertinent labs & imaging results that were available during my care of the patient were reviewed by me and considered in my medical decision making (see chart for details).  Clinical Course as of 02/22/21 2335  Tue Feb 22, 2021  2059 Labs reviewed.  Cardiac enzymes normal [JK]  2059 CBC metabolic panel normal [JK]  2059 X-ray normal [JK]  2120 Hcg slightly elevated.   Not consistent with pregnancy.  Previous values have been slightly elevatec [JK]    Clinical Course User Index [JK] Linwood Dibbles, MD   MDM Rules/Calculators/A&P                           Patient presented to the ED for evaluation of dizziness headache burning sensation.  Patient had a fall couple weeks ago.  She did have recent CT scans not showing any acute injuries.  Patient did have some back pain and tenderness she had spinal films today.  Those do not show signs of any fracture or dislocation.  Patient has no new deficits on exam.  Her primary complaint is of burning sensation.  I have reviewed prior records and she does appear to have some complaints of neuropathy.  Patient complains of some dizziness.  Questionable component of vertigo where she has had some ear discomfort as well.  Doubt acute stroke infection or other serious etiology.  Appears stable for discharge.  Recommend outpatient follow-up with PCP and neurology. Final Clinical Impression(s) / ED Diagnoses Final  diagnoses:  Thyroid nodule  Neuropathy  Vertigo    Rx / DC Orders ED Discharge Orders          Ordered    meclizine (ANTIVERT) 25 MG tablet  3 times daily PRN        02/22/21 2332             Linwood Dibbles, MD 02/22/21 2337

## 2021-02-22 NOTE — Discharge Instructions (Signed)
Follow-up with your primary care doctor or consider seeing a neurologist for further evaluation.

## 2021-02-23 MED ORDER — MECLIZINE HCL 25 MG PO TABS
25.0000 mg | ORAL_TABLET | Freq: Three times a day (TID) | ORAL | Status: DC | PRN
  Administered 2021-02-23: 25 mg via ORAL
  Filled 2021-02-23: qty 1

## 2021-02-23 NOTE — ED Notes (Signed)
Pt resting in bed complaining that dizziness is coming back, provider messaged to order more medications

## 2021-02-23 NOTE — ED Notes (Signed)
[facility] called re pickup status: 2nd in line

## 2021-02-23 NOTE — ED Notes (Signed)
Called [facility] to transport patient home.  

## 2021-03-05 ENCOUNTER — Observation Stay (HOSPITAL_COMMUNITY): Payer: Medicaid Other

## 2021-03-05 ENCOUNTER — Emergency Department (HOSPITAL_COMMUNITY): Payer: Medicaid Other

## 2021-03-05 ENCOUNTER — Inpatient Hospital Stay (HOSPITAL_COMMUNITY)
Admission: EM | Admit: 2021-03-05 | Discharge: 2021-03-13 | DRG: 057 | Disposition: A | Discharge status: Home Health | Payer: Medicaid Other | Attending: Family Medicine | Admitting: Family Medicine

## 2021-03-05 ENCOUNTER — Encounter (HOSPITAL_COMMUNITY): Payer: Self-pay | Admitting: Emergency Medicine

## 2021-03-05 ENCOUNTER — Other Ambulatory Visit: Payer: Self-pay

## 2021-03-05 DIAGNOSIS — W010XXA Fall on same level from slipping, tripping and stumbling without subsequent striking against object, initial encounter: Secondary | ICD-10-CM | POA: Diagnosis present

## 2021-03-05 DIAGNOSIS — I252 Old myocardial infarction: Secondary | ICD-10-CM

## 2021-03-05 DIAGNOSIS — I129 Hypertensive chronic kidney disease with stage 1 through stage 4 chronic kidney disease, or unspecified chronic kidney disease: Secondary | ICD-10-CM | POA: Diagnosis present

## 2021-03-05 DIAGNOSIS — U071 COVID-19: Secondary | ICD-10-CM | POA: Diagnosis present

## 2021-03-05 DIAGNOSIS — I951 Orthostatic hypotension: Secondary | ICD-10-CM | POA: Diagnosis present

## 2021-03-05 DIAGNOSIS — Z87891 Personal history of nicotine dependence: Secondary | ICD-10-CM

## 2021-03-05 DIAGNOSIS — Z7982 Long term (current) use of aspirin: Secondary | ICD-10-CM

## 2021-03-05 DIAGNOSIS — Z79899 Other long term (current) drug therapy: Secondary | ICD-10-CM

## 2021-03-05 DIAGNOSIS — R42 Dizziness and giddiness: Secondary | ICD-10-CM

## 2021-03-05 DIAGNOSIS — Z6833 Body mass index (BMI) 33.0-33.9, adult: Secondary | ICD-10-CM

## 2021-03-05 DIAGNOSIS — N1831 Chronic kidney disease, stage 3a: Secondary | ICD-10-CM | POA: Diagnosis present

## 2021-03-05 DIAGNOSIS — Z20822 Contact with and (suspected) exposure to covid-19: Secondary | ICD-10-CM | POA: Diagnosis present

## 2021-03-05 DIAGNOSIS — G629 Polyneuropathy, unspecified: Secondary | ICD-10-CM

## 2021-03-05 DIAGNOSIS — I69154 Hemiplegia and hemiparesis following nontraumatic intracerebral hemorrhage affecting left non-dominant side: Principal | ICD-10-CM

## 2021-03-05 DIAGNOSIS — F419 Anxiety disorder, unspecified: Secondary | ICD-10-CM | POA: Diagnosis present

## 2021-03-05 DIAGNOSIS — E876 Hypokalemia: Secondary | ICD-10-CM | POA: Diagnosis present

## 2021-03-05 DIAGNOSIS — Z8616 Personal history of COVID-19: Secondary | ICD-10-CM

## 2021-03-05 DIAGNOSIS — E785 Hyperlipidemia, unspecified: Secondary | ICD-10-CM | POA: Diagnosis present

## 2021-03-05 DIAGNOSIS — F32A Depression, unspecified: Secondary | ICD-10-CM | POA: Diagnosis present

## 2021-03-05 DIAGNOSIS — R519 Headache, unspecified: Secondary | ICD-10-CM | POA: Diagnosis not present

## 2021-03-05 DIAGNOSIS — R531 Weakness: Secondary | ICD-10-CM

## 2021-03-05 DIAGNOSIS — Z8249 Family history of ischemic heart disease and other diseases of the circulatory system: Secondary | ICD-10-CM

## 2021-03-05 DIAGNOSIS — I2510 Atherosclerotic heart disease of native coronary artery without angina pectoris: Secondary | ICD-10-CM | POA: Diagnosis present

## 2021-03-05 DIAGNOSIS — E669 Obesity, unspecified: Secondary | ICD-10-CM | POA: Diagnosis present

## 2021-03-05 DIAGNOSIS — M47814 Spondylosis without myelopathy or radiculopathy, thoracic region: Secondary | ICD-10-CM | POA: Diagnosis present

## 2021-03-05 LAB — COMPREHENSIVE METABOLIC PANEL
ALT: 22 U/L (ref 0–44)
AST: 20 U/L (ref 15–41)
Albumin: 3.4 g/dL — ABNORMAL LOW (ref 3.5–5.0)
Alkaline Phosphatase: 67 U/L (ref 38–126)
Anion gap: 9 (ref 5–15)
BUN: 10 mg/dL (ref 6–20)
CO2: 28 mmol/L (ref 22–32)
Calcium: 9.4 mg/dL (ref 8.9–10.3)
Chloride: 102 mmol/L (ref 98–111)
Creatinine, Ser: 1.03 mg/dL — ABNORMAL HIGH (ref 0.44–1.00)
GFR, Estimated: >60 mL/min (ref >60)
Glucose, Bld: 125 mg/dL — ABNORMAL HIGH (ref 70–99)
Potassium: 3.4 mmol/L — ABNORMAL LOW (ref 3.5–5.1)
Sodium: 139 mmol/L (ref 135–145)
Total Bilirubin: 0.5 mg/dL (ref 0.3–1.2)
Total Protein: 7.3 g/dL (ref 6.5–8.1)

## 2021-03-05 LAB — URINALYSIS, COMPLETE (UACMP) WITH MICROSCOPIC
Bacteria, UA: NONE SEEN
Bilirubin Urine: NEGATIVE
Glucose, UA: NEGATIVE mg/dL
Hgb urine dipstick: NEGATIVE
Ketones, ur: NEGATIVE mg/dL
Leukocytes,Ua: NEGATIVE
Nitrite: NEGATIVE
Protein, ur: NEGATIVE mg/dL
Specific Gravity, Urine: 1.01 (ref 1.005–1.030)
pH: 7 (ref 5.0–8.0)

## 2021-03-05 LAB — CBC
HCT: 41.3 % (ref 36.0–46.0)
Hemoglobin: 13.5 g/dL (ref 12.0–15.0)
MCH: 29.3 pg (ref 26.0–34.0)
MCHC: 32.7 g/dL (ref 30.0–36.0)
MCV: 89.6 fL (ref 80.0–100.0)
Platelets: 435 10*3/uL — ABNORMAL HIGH (ref 150–400)
RBC: 4.61 MIL/uL (ref 3.87–5.11)
RDW: 14.2 % (ref 11.5–15.5)
WBC: 6.7 10*3/uL (ref 4.0–10.5)
nRBC: 0 % (ref 0.0–0.2)

## 2021-03-05 LAB — DIFFERENTIAL
Abs Immature Granulocytes: 0.02 10*3/uL (ref 0.00–0.07)
Basophils Absolute: 0.1 10*3/uL (ref 0.0–0.1)
Basophils Relative: 1 %
Eosinophils Absolute: 0.2 10*3/uL (ref 0.0–0.5)
Eosinophils Relative: 3 %
Immature Granulocytes: 0 %
Lymphocytes Relative: 27 %
Lymphs Abs: 1.8 10*3/uL (ref 0.7–4.0)
Monocytes Absolute: 0.5 10*3/uL (ref 0.1–1.0)
Monocytes Relative: 8 %
Neutro Abs: 4.1 10*3/uL (ref 1.7–7.7)
Neutrophils Relative %: 61 %

## 2021-03-05 LAB — APTT: aPTT: 34 seconds (ref 24–36)

## 2021-03-05 LAB — PHOSPHORUS: Phosphorus: 3.2 mg/dL (ref 2.5–4.6)

## 2021-03-05 LAB — PROTIME-INR
INR: 0.9 (ref 0.8–1.2)
Prothrombin Time: 12.3 seconds (ref 11.4–15.2)

## 2021-03-05 LAB — CBG MONITORING, ED
Glucose-Capillary: 125 mg/dL — ABNORMAL HIGH (ref 70–99)
Glucose-Capillary: 134 mg/dL — ABNORMAL HIGH (ref 70–99)

## 2021-03-05 LAB — MAGNESIUM: Magnesium: 2.1 mg/dL (ref 1.7–2.4)

## 2021-03-05 MED ORDER — LACTATED RINGERS IV BOLUS
1000.0000 mL | Freq: Once | INTRAVENOUS | Status: AC
  Administered 2021-03-05: 1000 mL via INTRAVENOUS

## 2021-03-05 MED ORDER — KETOROLAC TROMETHAMINE 15 MG/ML IJ SOLN
15.0000 mg | Freq: Once | INTRAMUSCULAR | Status: AC
  Administered 2021-03-05: 15 mg via INTRAVENOUS
  Filled 2021-03-05: qty 1

## 2021-03-05 MED ORDER — LAMOTRIGINE 25 MG PO TABS: 25.0000 mg | ORAL_TABLET | Freq: Every day | ORAL | Status: DC

## 2021-03-05 MED ORDER — LISINOPRIL 10 MG PO TABS
10.0000 mg | ORAL_TABLET | Freq: Every day | ORAL | Status: DC
  Administered 2021-03-06 – 2021-03-09 (×4): 10 mg via ORAL
  Filled 2021-03-05 (×4): qty 1

## 2021-03-05 MED ORDER — HYDROCHLOROTHIAZIDE 25 MG PO TABS
25.0000 mg | ORAL_TABLET | Freq: Every day | ORAL | Status: DC
  Administered 2021-03-06 – 2021-03-07 (×2): 25 mg via ORAL
  Filled 2021-03-05 (×2): qty 1

## 2021-03-05 MED ORDER — KETOROLAC TROMETHAMINE 15 MG/ML IJ SOLN
15.0000 mg | INTRAMUSCULAR | Status: AC
  Administered 2021-03-06: 15 mg via INTRAVENOUS
  Filled 2021-03-05: qty 1

## 2021-03-05 MED ORDER — LAMOTRIGINE 25 MG PO TABS
25.0000 mg | ORAL_TABLET | Freq: Every day | ORAL | Status: DC
  Administered 2021-03-06 – 2021-03-13 (×8): 25 mg via ORAL
  Filled 2021-03-05 (×9): qty 1

## 2021-03-05 MED ORDER — CARVEDILOL 25 MG PO TABS
25.0000 mg | ORAL_TABLET | Freq: Two times a day (BID) | ORAL | Status: DC
  Administered 2021-03-05 – 2021-03-11 (×11): 25 mg via ORAL
  Filled 2021-03-05 (×2): qty 1
  Filled 2021-03-05: qty 2
  Filled 2021-03-05: qty 1
  Filled 2021-03-05: qty 2
  Filled 2021-03-05: qty 1
  Filled 2021-03-05: qty 2
  Filled 2021-03-05: qty 1
  Filled 2021-03-05: qty 2
  Filled 2021-03-05 (×3): qty 1

## 2021-03-05 MED ORDER — AMITRIPTYLINE HCL 50 MG PO TABS
25.0000 mg | ORAL_TABLET | Freq: Every day | ORAL | Status: DC
  Administered 2021-03-06 – 2021-03-13 (×8): 25 mg via ORAL
  Filled 2021-03-05 (×9): qty 1

## 2021-03-05 MED ORDER — POTASSIUM CHLORIDE CRYS ER 20 MEQ PO TBCR
40.0000 meq | EXTENDED_RELEASE_TABLET | Freq: Once | ORAL | Status: AC
  Administered 2021-03-05: 40 meq via ORAL
  Filled 2021-03-05: qty 2

## 2021-03-05 MED ORDER — GENTEAL TEARS 0.1-0.2-0.3 % OP SOLN: Freq: Every day | OPHTHALMIC | Status: DC | PRN

## 2021-03-05 MED ORDER — POLYVINYL ALCOHOL 1.4 % OP SOLN: 1.0000 [drp] | OPHTHALMIC | Status: DC | PRN

## 2021-03-05 MED ORDER — DORZOLAMIDE HCL-TIMOLOL MAL 2-0.5 % OP SOLN
1.0000 [drp] | Freq: Two times a day (BID) | OPHTHALMIC | Status: DC
  Administered 2021-03-06 – 2021-03-13 (×13): 1 [drp] via OPHTHALMIC
  Filled 2021-03-05: qty 10

## 2021-03-05 MED ORDER — SODIUM CHLORIDE 0.9% FLUSH: 3.0000 mL | Freq: Once | INTRAVENOUS | Status: DC

## 2021-03-05 MED ORDER — AMLODIPINE BESYLATE 5 MG PO TABS
5.0000 mg | ORAL_TABLET | Freq: Every day | ORAL | Status: DC
  Administered 2021-03-06 – 2021-03-09 (×4): 5 mg via ORAL
  Filled 2021-03-05 (×4): qty 1

## 2021-03-05 MED ORDER — DULOXETINE HCL 60 MG PO CPEP
60.0000 mg | ORAL_CAPSULE | Freq: Every day | ORAL | Status: DC
  Administered 2021-03-06 – 2021-03-13 (×8): 60 mg via ORAL
  Filled 2021-03-05 (×8): qty 1

## 2021-03-05 MED ORDER — METOCLOPRAMIDE HCL 5 MG/ML IJ SOLN
5.0000 mg | INTRAMUSCULAR | Status: AC
  Administered 2021-03-06: 5 mg via INTRAVENOUS
  Filled 2021-03-05: qty 2

## 2021-03-05 MED ORDER — ACETAMINOPHEN 500 MG PO TABS
1000.0000 mg | ORAL_TABLET | Freq: Once | ORAL | Status: AC
  Administered 2021-03-05: 1000 mg via ORAL
  Filled 2021-03-05: qty 2

## 2021-03-05 MED ORDER — ATORVASTATIN CALCIUM 80 MG PO TABS
80.0000 mg | ORAL_TABLET | Freq: Every day | ORAL | Status: DC
  Administered 2021-03-05 – 2021-03-12 (×8): 80 mg via ORAL
  Filled 2021-03-05 (×3): qty 1
  Filled 2021-03-05 (×2): qty 2
  Filled 2021-03-05 (×3): qty 1

## 2021-03-05 MED ORDER — OXYCODONE HCL 5 MG PO TABS
5.0000 mg | ORAL_TABLET | Freq: Four times a day (QID) | ORAL | Status: DC | PRN
  Administered 2021-03-06 – 2021-03-13 (×8): 5 mg via ORAL
  Filled 2021-03-05 (×8): qty 1

## 2021-03-05 MED ORDER — LATANOPROST 0.005 % OP SOLN
1.0000 [drp] | Freq: Every day | OPHTHALMIC | Status: DC
  Administered 2021-03-06 – 2021-03-12 (×8): 1 [drp] via OPHTHALMIC
  Filled 2021-03-05: qty 2.5

## 2021-03-05 MED ORDER — DIPHENHYDRAMINE HCL 50 MG/ML IJ SOLN
12.5000 mg | INTRAMUSCULAR | Status: AC
  Administered 2021-03-06: 12.5 mg via INTRAVENOUS
  Filled 2021-03-05: qty 1

## 2021-03-05 MED ORDER — AMLODIPINE BESYLATE 5 MG PO TABS: 5.0000 mg | ORAL_TABLET | Freq: Every day | ORAL | Status: DC

## 2021-03-05 MED ORDER — METOPROLOL TARTRATE 5 MG/5ML IV SOLN: 2.5000 mg | Freq: Four times a day (QID) | INTRAVENOUS | Status: DC | PRN

## 2021-03-05 MED ORDER — ASPIRIN EC 81 MG PO TBEC
81.0000 mg | DELAYED_RELEASE_TABLET | Freq: Every day | ORAL | Status: DC
  Administered 2021-03-06 – 2021-03-13 (×8): 81 mg via ORAL
  Filled 2021-03-05 (×8): qty 1

## 2021-03-05 MED ORDER — GABAPENTIN 300 MG PO CAPS
300.0000 mg | ORAL_CAPSULE | Freq: Three times a day (TID) | ORAL | 0 refills | Status: DC
Start: 2021-03-05 — End: 2021-03-13

## 2021-03-05 MED ORDER — ESCITALOPRAM OXALATE 10 MG PO TABS
5.0000 mg | ORAL_TABLET | Freq: Every day | ORAL | Status: DC
  Administered 2021-03-06 – 2021-03-13 (×8): 5 mg via ORAL
  Filled 2021-03-05 (×8): qty 1

## 2021-03-05 NOTE — ED Triage Notes (Addendum)
Pt to triage via [facility] from home.  Reports history of stroke in 2020.  States she is having more "tightness" and weakness on L side of body than usual since 11am.  History of L sided deficits from previous CVA.   Reports headache.

## 2021-03-05 NOTE — ED Provider Notes (Signed)
Emergency Medicine Provider Triage Evaluation Note  Shirley Hansen , a 55 y.o. female  was evaluated in triage.  Pt complains of tightness in the left side of her body.  Reports stroke back in 2020 resulting in left-sided deficits.  At baseline she does have weakness on her left upper and lower extremity as well as numbness/paresthesias.  She feels that the tightness has also been chronic since then however since 11 AM today feels that the tightness is worse which prompted her visit to the ER.  Also reports left-sided headache.  Denies any vision changes, facial asymmetry, fall.  Review of Systems  Positive: Left-sided body tightness, headache Negative: Vision changes, worsening numbness or weakness  Physical Exam  BP 113/82 (BP Location: Left Arm)   Pulse 99   Temp 98.7 F (37.1 C) (Oral)   Resp 16   SpO2 96%  Gen:   Awake, no distress   Resp:  Normal effort  MSK:   Moves extremities without difficulty  Other:  Decreased strength in the left upper and lower extremity but normal sensation to light touch of extremities.  Symmetric tongue protrusion.  Pupils equal and reactive to light  Medical Decision Making  Medically screening exam initiated at 1:16 PM.  Appropriate orders placed.  Joselyne Spake was informed that the remainder of the evaluation will be completed by another provider, this initial triage assessment does not replace that evaluation, and the importance of remaining in the ED until their evaluation is complete.  No indication for code stroke as unsure if her actual neurological complaints are worse than her baseline as she keeps reiterating that it feels like a "tightness" but will order stroke work-up.   Dietrich Pates, PA-C 03/05/21 1325    Vanetta Mulders, MD 03/14/21 1310

## 2021-03-05 NOTE — ED Provider Notes (Signed)
55 y.o. female with PMHx previous CVA with residual left-sided weakness, who presents for acute on chronic worsening of her weakness. Not-antigravity in LLE, LUE 3/5. She claims this is worse than normal.  CT head WO without acute findings.   Signed out to me by previous provider at 3:30PM.   Plan to: - Follow up MRI - Neurology consult  Physical Exam  BP 121/90 (BP Location: Right Arm)   Pulse 97   Temp 98.7 F (37.1 C) (Oral)   Resp (!) 24   Ht 5\' 4"  (1.626 m)   Wt 89.8 kg   SpO2 94%   BMI 33.99 kg/m   Physical Exam Constitutional:      Appearance: She is obese.  HENT:     Head: Normocephalic and atraumatic.  Cardiovascular:     Rate and Rhythm: Normal rate and regular rhythm.     Pulses: Normal pulses.  Pulmonary:     Effort: Pulmonary effort is normal.  Neurological:     Mental Status: She is alert and oriented to person, place, and time.     Motor: Weakness present.     ED Course/Procedures     Procedures  MDM  Labs are vastly unremarkable.  Urinalysis without evidence of UTI.  MRI brain without acute findings.  Case was discussed with neurology, who believes symptoms are likely due to sequela of old stroke.  Attempted to ambulate patient without success.  Patient is tearful, stating that she does not feel right and that she is much more weak following a fall that she had almost 4 weeks ago.  She states that she has gradually become more weak since that time.  I believe the patient is very deconditioned, suffering from sequela of her previous stroke, and would benefit from PT/OT in an attempt to regain some of her functional capacity.  She is currently not safe for discharge at this time.  Case was discussed with hospitalist, who agreed to admit the patient for further management.       , MD 03/06/21 1441    03/08/21, MD 03/09/21 1315

## 2021-03-05 NOTE — H&P (Addendum)
History and Physical  Shirley Hansen NGE:[id number] DOB: 17-Feb-1966 DOA: 03/05/2021  Referring physician: Sebastian Ache  PCP: Pcp, No  Outpatient Specialists: Neurology. Patient coming from: Home.  Chief Complaint: Left-sided weakness  HPI: Shirley Hansen is a 55 y.o. female with medical history significant for CVA with left-sided spastic hemiparesis followed by neurology at [facility], muscle spasm of left lower extremity, nontraumatic subcortical hemorrhage of right cerebral hemisphere, muscle spasticity, history of hypokalemia, coronary artery disease with history of MI in 2017, essential hypertension, hyperlipidemia, chronic anxiety/depression, who presented to [facility] ED due to worsening left-sided weakness and paresthesia.  Work-up in the ED unremarkable, MRI brain with no acute findings, UA negative for pyuria, lab studies essentially unremarkable.  While in the ED the patient is too weak to be discharged from the ED.  Endorses a headache not improved with ED interventions.  EDP requested admission for observation overnight, PT OT assessment.  Patient admitted by hospitalist service.  ED Course: Temperature 98.7.  BP 143/95, pulse 102, respiration rate 17, O2 saturation 100% on room air.  Review of Systems: Review of systems as noted in the HPI. All other systems reviewed and are negative.   Past Medical History:  Diagnosis Date   Hypertension    Pancreatitis    Stage 3a chronic kidney disease (HCC)    Stroke [facility])    Past Surgical History:  Procedure Laterality Date   TUBAL LIGATION      Social History:  reports that she has quit smoking. She has never used smokeless tobacco. She reports that she does not drink alcohol and does not use drugs.   No Known Allergies  Family history: Mother with history of hypertension Father with history of heart failure  Prior to Admission medications   Medication Sig Start Date End Date Taking? Authorizing Provider  acetaminophen (TYLENOL) 325 MG  tablet Take 650 mg by mouth every 6 (six) hours as needed for mild pain.   Yes [provider]  albuterol (VENTOLIN HFA) 108 (90 Base) MCG/ACT inhaler Inhale 2 puffs into the lungs every 2 (two) hours as needed for wheezing or shortness of breath. 07/28/20  Yes Ghimire, Werner Lean, MD  amitriptyline (ELAVIL) 25 MG tablet Take 25 mg by mouth daily at 6 (six) AM. 02/07/21 02/07/22 Yes [provider]  amLODipine (NORVASC) 5 MG tablet Take 5 mg by mouth daily. 02/07/21  Yes [provider]  Artificial Tear Solution (GENTEAL TEARS OP) Place 1 drop into both eyes daily as needed (dry eyes).   Yes [provider]  aspirin EC 81 MG tablet Take 81 mg by mouth daily. Swallow whole.   Yes [provider]  atorvastatin (LIPITOR) 80 MG tablet Take 80 mg by mouth at bedtime. 02/07/21 02/07/22 Yes [provider]  baclofen (LIORESAL) 10 MG tablet Take 10 mg by mouth 4 (four) times daily. 01/28/21  Yes [provider]  brimonidine (ALPHAGAN) 0.2 % ophthalmic solution Place 1 drop into both eyes 2 (two) times daily. 06/26/20  Yes [provider]  carvedilol (COREG) 25 MG tablet Take 25 mg by mouth 2 (two) times daily. 02/07/21  Yes [provider]  diclofenac Sodium (VOLTAREN) 1 % GEL Apply 1 application topically 4 (four) times daily as needed (pain). 05/20/20  Yes [provider]  dorzolamide-timolol (COSOPT) 22.3-6.8 MG/ML ophthalmic solution Place 1 drop into both eyes 2 (two) times daily. 05/31/20  Yes [provider]  DULoxetine (CYMBALTA) 60 MG capsule Take 60 mg by mouth daily.  02/07/21 02/07/22 Yes [provider]  escitalopram (LEXAPRO) 5 MG tablet Take 5 mg by mouth daily. 01/28/21  Yes [provider]  gabapentin (NEURONTIN) 300 MG capsule Take 1 capsule (300 mg total) by mouth 3 (three) times daily. 03/05/21  Yes Holley Dexter, MD  hydrochlorothiazide (HYDRODIURIL) 25 MG tablet Take 25 mg by mouth  daily. 02/07/21  Yes [provider]  lamoTRIgine (LAMICTAL) 25 MG tablet Take 25 mg by mouth daily. 02/08/21  Yes [provider]  latanoprost (XALATAN) 0.005 % ophthalmic solution Place 1 drop into both eyes at bedtime. 06/17/20  Yes [provider]  lisinopril (ZESTRIL) 10 MG tablet Take 10 mg by mouth daily. 01/28/21  Yes [provider]  meclizine (ANTIVERT) 25 MG tablet Take 1 tablet (25 mg total) by mouth 3 (three) times daily as needed for dizziness. 02/22/21  Yes Linwood Dibbles, MD  Netarsudil Dimesylate 0.02 % SOLN Place 1 drop into both eyes at bedtime. 02/07/21  Yes [provider]  oxyCODONE (OXY IR/ROXICODONE) 5 MG immediate release tablet Take 5 mg by mouth every 6 (six) hours as needed for pain. 02/07/21  Yes [provider]  potassium chloride SA (KLOR-CON) 20 MEQ tablet Take 40 mEq by mouth daily. 01/28/21  Yes [provider]    Physical Exam: BP (!) 143/95   Pulse (!) 102   Temp 98.7 F (37.1 C) (Oral)   Resp 17   Ht 5\' 4"  (1.626 m)   Wt 89.8 kg   SpO2 100%   BMI 33.99 kg/m   General: 55 y.o. year-old female well developed well nourished in no acute distress.  Alert and oriented x3. Cardiovascular: Regular rate and rhythm with no rubs or gallops.  No thyromegaly or JVD noted.  No lower extremity edema. 2/4 pulses in all 4 extremities. Respiratory: Clear to auscultation with no wheezes or rales. Good inspiratory effort. Abdomen: Soft nontender nondistended with normal bowel sounds x4 quadrants. Muskuloskeletal: No cyanosis, clubbing or edema noted bilaterally Neuro: CN II-XII intact, strength, sensation, reflexes Skin: No ulcerative lesions noted or rashes Psychiatry: Judgement and insight appear normal. Mood is appropriate for condition and setting          Labs on Admission:  Basic Metabolic Panel: Recent Labs  Lab 03/05/21 1332 03/05/21 1631  NA 139  --   K 3.4*  --   CL 102  --   CO2 28  --   GLUCOSE  125*  --   BUN 10  --   CREATININE 1.03*  --   CALCIUM 9.4  --   MG  --  2.1  PHOS  --  3.2   Liver Function Tests: Recent Labs  Lab 03/05/21 1332  AST 20  ALT 22  ALKPHOS 67  BILITOT 0.5  PROT 7.3  ALBUMIN 3.4*   No results for input(s): LIPASE, AMYLASE in the last 168 hours. No results for input(s): AMMONIA in the last 168 hours. CBC: Recent Labs  Lab 03/05/21 1332  WBC 6.7  NEUTROABS 4.1  HGB 13.5  HCT 41.3  MCV 89.6  PLT 435*   Cardiac Enzymes: No results for input(s): CKTOTAL, CKMB, CKMBINDEX, TROPONINI in the last 168 hours.  BNP (last 3 results) Recent Labs    07/17/20 0453 07/21/20 0105 07/22/20 0144  BNP 247.5* 69.8 96.5    ProBNP (last 3 results) No results for input(s): PROBNP in the last 8760 hours.  CBG: Recent Labs  Lab 03/05/21 1319 03/05/21 1627  GLUCAP 125*  134*    Radiological Exams on Admission: CT HEAD WO CONTRAST  Result Date: 03/05/2021 CLINICAL DATA:  Neuro deficit, acute, stroke suspected EXAM: CT HEAD WITHOUT CONTRAST TECHNIQUE: Contiguous axial images were obtained from the base of the skull through the vertex without intravenous contrast. COMPARISON:  02/17/2021 FINDINGS: Brain: No evidence of acute infarction, hemorrhage, hydrocephalus, extra-axial collection or mass lesion/mass effect. Chronic changes related to prior right thalamic hemorrhage are unchanged. Small remote lacunar infarcts again noted in the pons and left corona radiata. Vascular: Atherosclerotic calcifications involving the large vessels of the skull base. No unexpected hyperdense vessel. Skull: Normal. Negative for fracture or focal lesion. Sinuses/Orbits: No acute finding. Other: None. IMPRESSION: No acute intracranial findings.  Stable exam from 02/17/2021. Electronically Signed   By: Duanne Guess D.O.   On: 03/05/2021 14:22   MR Brain Wo Contrast (neuro protocol)  Result Date: 03/05/2021 CLINICAL DATA:  Neuro deficit, acute, stroke suspected. EXAM: MRI  HEAD WITHOUT CONTRAST TECHNIQUE: Multiplanar, multiecho pulse sequences of the brain and surrounding structures were obtained without intravenous contrast. COMPARISON:  Head CT 03/05/2021.  Brain MRI 07/05/2020 FINDINGS: Brain: Mild generalized cerebral and cerebellar atrophy. Redemonstrated chronic small-vessel infarcts within the bilateral corona radiata, left basal ganglia, left thalamus, and within the pons. Redemonstrated sequela of remote hemorrhage within the right corona radiata/thalamus. 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 There is no acute infarct. No evidence of an intracranial mass. No extra-axial fluid collection. No midline shift. Vascular: Maintained flow voids within the proximal large arterial vessels. Skull and upper cervical spine: No focal suspicious marrow lesion Sinuses/Orbits: Visualized orbits show no acute finding. No significant paranasal sinus disease. IMPRESSION: No evidence of acute intracranial abnormality. Redemonstrated sequela of remote hemorrhage within the right corona radiata/thalamus. Redemonstrated chronic small-vessel infarcts within the bilateral corona radiata, left basal ganglia, left thalamus and within the pons. 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 Mild generalized parenchymal atrophy. Electronically Signed   By: Jackey Loge D.O.   On: 03/05/2021 16:01    EKG: I independently viewed the EKG done and my findings are as followed: Sinus rhythm rate of 96.  RBBB B, nonspecific ST changes.  QTc 476.  Assessment/Plan Present on Admission: **None**  Active Problems:   Weakness  Left-sided weakness in the setting of prior CVA with left spastic hemiparesis Work-up  unremarkable, MRI brain negative for acute CVA, UA negative.  Lab studies unremarkable. PT OT assessment Fall precautions TOC to assist with DC planning CIR consulted at patient's request.  Hypertension, uncontrolled BP not at goal, elevated Resume home oral antihypertensives IV antihypertensives as needed with parameters Monitor vital signs Patient has an upcoming appointment with her PCP on 03/08/2021.  Encouraged to keep her appointment.  Coronary artery disease status post MI in 2017 Resume home aspirin and Lipitor.  No anginal symptoms Twelve-lead EKG nonacute.  Hyperlipidemia Resume home Lipitor  Chronic anxiety/depression Resume home regimen  Intractable headache Headache cocktail given, 1 dose of IV Toradol 15 mg x 1, 1 dose of IV Reglan 5 mg x 1, and 1 dose of IV Benadryl 12.5 mg x 1.       DVT prophylaxis: SCDs  Code Status: Full code  Family Communication: None at bedside.  Disposition Plan: Admit to MedSurg unit with remote telemetry  Consults called: CIR for inpatient rehab  Admission  status: Observation status   Status is: Observation    Dispo:  Patient From:  Home  Planned Disposition:  Home  Medically stable for discharge:  No       Darlin Drop MD [facility] Pager [phone]  If 7PM-7AM, please contact night-coverage [email or web address] Password [facility]  03/05/2021, 8:48 PM

## 2021-03-05 NOTE — ED Provider Notes (Signed)
[facility] EMERGENCY DEPARTMENT Provider Note   CSN: [id number] Arrival date & time: 03/05/21  1244     History Chief Complaint  Patient presents with   Weakness    Shirley Hansen is a 55 y.o. female.  With past medical history of stroke in 2020 with residual left-sided deficits, CKD 3, hypertension who presents the emergency department with weakness.  States that she woke up this morning at 11 AM and felt "tightness" on her left side which was different from her baseline.  She states that she attempted to get out of bed to her bedside commode, and when she attempted to get back up into bed she had increased weakness.  Also complains of sensation differences on her left side.  She states she has residual tingling on her left face, however feels more numb throughout the left side of her body.  Also complains of a left-sided headache.  Unknown bedtime the previous night.  Denies visual changes, falls, facial droop.  She is not anticoagulated.   Weakness Associated symptoms: headaches   Associated symptoms: no fever and no seizures       Past Medical History:  Diagnosis Date   Hypertension    Pancreatitis    Stage 3a chronic kidney disease (HCC)    Stroke [facility])     Patient Active Problem List   Diagnosis Date Noted   COVID-19 07/14/2020   Stroke [facility])    Stage 3a chronic kidney disease (HCC)    Hypertension     Past Surgical History:  Procedure Laterality Date   TUBAL LIGATION       OB History   No obstetric history on file.     No family history on file.  Social History   Tobacco Use   Smoking status: Former   Smokeless tobacco: Never  Substance Use Topics   Alcohol use: Never   Drug use: Never    Home Medications Prior to Admission medications   Medication Sig Start Date End Date Taking? Authorizing Provider  albuterol (VENTOLIN HFA) 108 (90 Base) MCG/ACT inhaler Inhale 2 puffs into the lungs every 2 (two) hours as needed for wheezing or  shortness of breath. 07/28/20   Ghimire, Werner Lean, MD  amitriptyline (ELAVIL) 25 MG tablet Take 25 mg by mouth daily at 6 (six) AM. 02/07/21 02/07/22  [provider]  amLODipine (NORVASC) 10 MG tablet Take 1 tablet (10 mg total) by mouth daily. Patient not taking: Reported on 02/22/2021 07/29/20   Maretta Bees, MD  amLODipine (NORVASC) 5 MG tablet Take 5 mg by mouth daily. 02/07/21   [provider]  Artificial Tear Solution (GENTEAL TEARS OP) Place 1 drop into both eyes daily as needed (dry eyes).    [provider]  aspirin EC 81 MG tablet Take 81 mg by mouth daily. Swallow whole.    [provider]  atorvastatin (LIPITOR) 80 MG tablet Take 80 mg by mouth at bedtime. 02/07/21 02/07/22  [provider]  baclofen (LIORESAL) 10 MG tablet Take 10 mg by mouth 4 (four) times daily. 01/28/21   [provider]  brimonidine (ALPHAGAN) 0.2 % ophthalmic solution Place 1 drop into both eyes 2 (two) times daily. 06/26/20   [provider]  carvedilol (COREG) 25 MG tablet Take 25 mg by mouth 2 (two) times daily. 02/07/21   [provider]  colchicine 0.6 MG tablet Take 1 tablet (0.6 mg total) by mouth daily. Patient not taking: Reported on 02/22/2021 07/29/20  Ghimire, Werner Lean, MD  diclofenac Sodium (VOLTAREN) 1 % GEL Apply 1 application topically 4 (four) times daily as needed (pain). 05/20/20   [provider]  dorzolamide-timolol (COSOPT) 22.3-6.8 MG/ML ophthalmic solution Place 1 drop into both eyes 2 (two) times daily. 05/31/20   [provider]  DULoxetine (CYMBALTA) 60 MG capsule Take 60 mg by mouth daily. 02/07/21 02/07/22  [provider]  escitalopram (LEXAPRO) 5 MG tablet Take 5 mg by mouth daily. 01/28/21   [provider]  feeding supplement (ENSURE ENLIVE / ENSURE PLUS) LIQD Take 237 mLs by mouth 2 (two) times daily between meals. 07/28/20   Ghimire, Werner Lean, MD  gabapentin (NEURONTIN) 400 MG  capsule Take 400 mg by mouth 2 (two) times daily. 01/28/21   [provider]  gabapentin (NEURONTIN) 600 MG tablet Take 600 mg by mouth at bedtime. 01/28/21   [provider]  hydrochlorothiazide (HYDRODIURIL) 25 MG tablet Take 25 mg by mouth daily. 02/07/21   [provider]  lamoTRIgine (LAMICTAL) 25 MG tablet Take 25 mg by mouth daily. 02/08/21   [provider]  latanoprost (XALATAN) 0.005 % ophthalmic solution Place 1 drop into both eyes at bedtime. 06/17/20   [provider]  lisinopril (ZESTRIL) 10 MG tablet Take 10 mg by mouth daily. 01/28/21   [provider]  loratadine (CLARITIN) 10 MG tablet Take 1 tablet (10 mg total) by mouth daily. 07/29/20   Ghimire, Werner Lean, MD  meclizine (ANTIVERT) 25 MG tablet Take 1 tablet (25 mg total) by mouth 3 (three) times daily as needed for dizziness. 02/22/21   Linwood Dibbles, MD  Multiple Vitamin (MULTIVITAMIN WITH MINERALS) TABS tablet Take 1 tablet by mouth daily. 07/29/20   Ghimire, Werner Lean, MD  Netarsudil Dimesylate 0.02 % SOLN Place 1 drop into both eyes at bedtime. 02/07/21   [provider]  Nutritional Supplements (FEEDING SUPPLEMENT, NEPRO CARB STEADY,) LIQD Take 237 mLs by mouth 3 (three) times daily as needed (Supplement). 07/28/20   Ghimire, Werner Lean, MD  oxyCODONE (OXY IR/ROXICODONE) 5 MG immediate release tablet Take 5 mg by mouth every 6 (six) hours as needed for pain. 02/07/21   [provider]  pantoprazole (PROTONIX) 40 MG tablet Take 1 tablet (40 mg total) by mouth daily. 07/29/20   Ghimire, Werner Lean, MD  polyethylene glycol (MIRALAX / GLYCOLAX) 17 g packet Take 17 g by mouth daily as needed for mild constipation. 07/28/20   Ghimire, Werner Lean, MD  potassium chloride SA (KLOR-CON) 20 MEQ tablet Take 40 mEq by mouth daily. 01/28/21   [provider]  pregabalin (LYRICA) 50 MG capsule Take 50 mg by mouth 2 (two) times daily. 01/28/21   [provider]  tiZANidine  (ZANAFLEX) 2 MG tablet Take 2 mg by mouth every 8 (eight) hours as needed for muscle spasms. 01/28/21   [provider]    Allergies    Patient has no known allergies.  Review of Systems   Review of Systems  Constitutional:  Negative for fatigue and fever.  Neurological:  Positive for weakness and headaches. Negative for seizures, facial asymmetry, speech difficulty and light-headedness.  All other systems reviewed and are negative.  Physical Exam Updated Vital Signs BP 121/90 (BP Location: Right Arm)   Pulse 97   Temp 98.7 F (37.1 C) (Oral)   Resp (!) 24   Ht 5\' 4"  (1.626 m)   Wt 89.8 kg   SpO2 94%   BMI 33.99 kg/m  Physical Exam Vitals and nursing note reviewed.  Constitutional:      Appearance: Normal appearance.  HENT:     Head: Normocephalic and atraumatic.     Mouth/Throat:     Mouth: Mucous membranes are moist.     Pharynx: Oropharynx is clear.  Eyes:     General: No scleral icterus.    Extraocular Movements: Extraocular movements intact.     Conjunctiva/sclera: Conjunctivae normal.     Pupils: Pupils are equal, round, and reactive to light.  Cardiovascular:     Rate and Rhythm: Normal rate and regular rhythm.     Pulses: Normal pulses.     Heart sounds: Normal heart sounds. No murmur heard. Pulmonary:     Effort: Pulmonary effort is normal.     Breath sounds: Normal breath sounds.  Abdominal:     General: Bowel sounds are normal.     Palpations: Abdomen is soft.  Musculoskeletal:     Cervical back: Normal range of motion and neck supple. No tenderness.  Skin:    General: Skin is warm and dry.     Capillary Refill: Capillary refill takes less than 2 seconds.  Neurological:     Mental Status: She is alert and oriented to person, place, and time.     Cranial Nerves: Cranial nerves are intact.     Sensory: Sensory deficit present.     Motor: Weakness present.     Comments: Left upper extremity 4/5 Left lower extremity 3/5  right upper  extremity 5/5  right lower extremity 5/5  Psychiatric:        Mood and Affect: Mood normal.        Behavior: Behavior normal.    ED Results / Procedures / Treatments   Labs (all labs ordered are listed, but only abnormal results are displayed) Labs Reviewed  CBC - Abnormal; Notable for the following components:      Result Value   Platelets 435 (*)    All other components within normal limits  COMPREHENSIVE METABOLIC PANEL - Abnormal; Notable for the following components:   Potassium 3.4 (*)    Glucose, Bld 125 (*)    Creatinine, Ser 1.03 (*)    Albumin 3.4 (*)    All other components within normal limits  CBG MONITORING, ED - Abnormal; Notable for the following components:   Glucose-Capillary 125 (*)    All other components within normal limits  PROTIME-INR  APTT  DIFFERENTIAL    EKG None  Radiology CT HEAD WO CONTRAST  Result Date: 03/05/2021 CLINICAL DATA:  Neuro deficit, acute, stroke suspected EXAM: CT HEAD WITHOUT CONTRAST TECHNIQUE: Contiguous axial images were obtained from the base of the skull through the vertex without intravenous contrast. COMPARISON:  02/17/2021 FINDINGS: Brain: No evidence of acute infarction, hemorrhage, hydrocephalus, extra-axial collection or mass lesion/mass effect. Chronic changes related to prior right thalamic hemorrhage are unchanged. Small remote lacunar infarcts again noted in the pons and left corona radiata. Vascular: Atherosclerotic calcifications involving the large vessels of the skull base. No unexpected hyperdense vessel. Skull: Normal. Negative for fracture or focal lesion. Sinuses/Orbits: No acute finding. Other: None. IMPRESSION: No acute intracranial findings.  Stable exam from 02/17/2021. Electronically Signed   By: Duanne Guess D.O.   On: 03/05/2021 14:22    Procedures Procedures   Medications Ordered in ED Medications  sodium chloride flush (NS) 0.9 % injection 3 mL (has no administration in time range)    ED  Course  I have reviewed the  triage vital signs and the nursing notes.  Pertinent labs & imaging results that were available during my care of the patient were reviewed by me and considered in my medical decision making (see chart for details).    MDM Rules/Calculators/A&P 55 year old female with weakness and history of previous CVA  Presentation is concerning for CVA versus TIA. Not hypoglycemic Basic labs without metabolic derangements so doubt encephalopathy, medication side effect. EKG without ischemia or infarction. CT head with no acute intracranial findings.  However given concerning symptoms obtaining MRI brain without contrast. 1600: Spoke with Dr. Wilford Corner with neurology who is requesting that we repage after results of MRI.  At time of handoff patient is currently in MRI.  Last set of vitals stable.  Final Clinical Impression(s) / ED Diagnoses Final diagnoses:  Weakness    Rx / DC Orders ED Discharge Orders     None        Cristopher Peru, PA-C 03/05/21 1602    Horton, Clabe Seal, DO 03/07/21 [phone]

## 2021-03-05 NOTE — ED Notes (Signed)
Patient transported to MRI 

## 2021-03-05 NOTE — ED Notes (Signed)
Patient transported to CT 

## 2021-03-06 DIAGNOSIS — R531 Weakness: Secondary | ICD-10-CM | POA: Diagnosis not present

## 2021-03-06 LAB — BASIC METABOLIC PANEL
Anion gap: 9 (ref 5–15)
BUN: 8 mg/dL (ref 6–20)
CO2: 28 mmol/L (ref 22–32)
Calcium: 9.3 mg/dL (ref 8.9–10.3)
Chloride: 103 mmol/L (ref 98–111)
Creatinine, Ser: 1.05 mg/dL — ABNORMAL HIGH (ref 0.44–1.00)
GFR, Estimated: >60 mL/min (ref >60)
Glucose, Bld: 117 mg/dL — ABNORMAL HIGH (ref 70–99)
Potassium: 3.6 mmol/L (ref 3.5–5.1)
Sodium: 140 mmol/L (ref 135–145)

## 2021-03-06 LAB — SARS CORONAVIRUS 2 (TAT 6-24 HRS): SARS Coronavirus 2: NEGATIVE

## 2021-03-06 LAB — PROCALCITONIN: Procalcitonin: <0.1 ng/mL

## 2021-03-06 MED ORDER — ACETAMINOPHEN 325 MG PO TABS
650.0000 mg | ORAL_TABLET | Freq: Four times a day (QID) | ORAL | Status: DC | PRN
  Administered 2021-03-06 – 2021-03-12 (×5): 650 mg via ORAL
  Filled 2021-03-06 (×5): qty 2

## 2021-03-06 NOTE — Progress Notes (Signed)
Progress Note    Shirley Hansen  HUD:[id number] DOB: [birth year]  DOA: 03/05/2021 PCP: Pcp, No    Brief Narrative:    Medical records reviewed and are as summarized below:  Shirley Hansen is an 55 y.o. female with medical history significant for CVA with left-sided spastic hemiparesis followed by neurology at [facility] health, muscle spasm of left lower extremity, nontraumatic subcortical hemorrhage of right cerebral hemisphere, muscle spasticity, history of hypokalemia, coronary artery disease with history of MI in 2017, essential hypertension, hyperlipidemia, chronic anxiety/depression, who presented to [facility] ED due to worsening left-sided weakness and paresthesia.   Assessment/Plan:   Active Problems:   Weakness   Left-sided weakness in the setting of prior CVA with left spastic hemiparesis Work-up unremarkable, MRI brain negative for acute CVA, UA negative.  Lab studies unremarkable. PT OT assessment Fall precautions TOC to assist with DC planning CIR consulted at patient's request.  ? PNA -x ray shows: Right basilar consolidation, new since prior examination + cough for 2 weeks per patient, no fever, no WBC count -check pro calcitonin -denies swallowing issues   Hypertension, uncontrolled Resume home oral antihypertensives   Coronary artery disease status post MI in 2017 Resume home aspirin and Lipitor.  No anginal symptoms   Hyperlipidemia Resume home Lipitor   Chronic anxiety/depression Resume home regimen   Intractable headache Headache cocktail given, 1 dose of IV Toradol 15 mg x 1, 1 dose of IV Reglan 5 mg x 1, and 1 dose of IV Benadryl 12.5 mg x 1. -appears resolved   obesity Body mass index is 33.99 kg/m.   Family Communication/Anticipated D/C date and plan/Code Status   DVT prophylaxis: Lovenox ordered. Code Status: Full Code.   Disposition Plan: Status is: Observation  The patient will require care spanning > 2 midnights and should be moved to inpatient  because: Inpatient level of care appropriate due to severity of illness  Dispo:  Patient From: Home  Planned Disposition: Home- lives at home with 1 yo daughter/neice  Medically stable for discharge: No      [facility]  Medical Consultants:   None.   Subjective:   + cough x 2 weeks  Objective:    Vitals:   03/06/21 0400 03/06/21 0523 03/06/21 0600 03/06/21 0824  BP: 99/71 126/84 115/84 129/89  Pulse: 84 85 81 83  Resp: 17 14 15 14   Temp:  98.1 F (36.7 C)  98.3 F (36.8 C)  TempSrc:  Oral  Oral  SpO2: 95% 96% 95% 94%  Weight:      Height:        Intake/Output Summary (Last 24 hours) at 03/06/2021 03/08/2021 Last data filed at 03/06/2021 0107 Gross per 24 hour  Intake 1000.71 ml  Output 1600 ml  Net -599.29 ml   Filed Weights   03/05/21 1431  Weight: 89.8 kg    Exam:  General: Appearance:    Obese female in no acute distress     Lungs:     respirations unlabored  Heart:    Normal heart rate.    MS:   All extremities are intact.    Neurologic:   Awake, alert, oriented x 3     Data Reviewed:   I have personally reviewed following labs and imaging studies:  Labs: Labs show the following:   Basic Metabolic Panel: Recent Labs  Lab 03/05/21 1332 03/05/21 1631 03/06/21 0500  NA 139  --  140  K 3.4*  --  3.6  CL 102  --  103  CO2 28  --  28  GLUCOSE 125*  --  117*  BUN 10  --  8  CREATININE 1.03*  --  1.05*  CALCIUM 9.4  --  9.3  MG  --  2.1  --   PHOS  --  3.2  --    GFR Estimated Creatinine Clearance: 65.7 mL/min (A) (by C-G formula based on SCr of 1.05 mg/dL (H)). Liver Function Tests: Recent Labs  Lab 03/05/21 1332  AST 20  ALT 22  ALKPHOS 67  BILITOT 0.5  PROT 7.3  ALBUMIN 3.4*   No results for input(s): LIPASE, AMYLASE in the last 168 hours. No results for input(s): AMMONIA in the last 168 hours. Coagulation profile Recent Labs  Lab 03/05/21 1332  INR 0.9    CBC: Recent Labs  Lab  03/05/21 1332  WBC 6.7  NEUTROABS 4.1  HGB 13.5  HCT 41.3  MCV 89.6  PLT 435*   Cardiac Enzymes: No results for input(s): CKTOTAL, CKMB, CKMBINDEX, TROPONINI in the last 168 hours. BNP (last 3 results) No results for input(s): PROBNP in the last 8760 hours. CBG: Recent Labs  Lab 03/05/21 1319 03/05/21 1627  GLUCAP 125* 134*   D-Dimer: No results for input(s): DDIMER in the last 72 hours. Hgb A1c: No results for input(s): HGBA1C in the last 72 hours. Lipid Profile: No results for input(s): CHOL, HDL, LDLCALC, TRIG, CHOLHDL, LDLDIRECT in the last 72 hours. Thyroid function studies: No results for input(s): TSH, T4TOTAL, T3FREE, THYROIDAB in the last 72 hours.  Invalid input(s): FREET3 Anemia work up: No results for input(s): VITAMINB12, FOLATE, FERRITIN, TIBC, IRON, RETICCTPCT in the last 72 hours. Sepsis Labs: Recent Labs  Lab 03/05/21 1332  WBC 6.7    Microbiology Recent Results (from the past 240 hour(s))  SARS CORONAVIRUS 2 (TAT 6-24 HRS) Nasopharyngeal Nasopharyngeal Swab     Status: None   Collection Time: 03/05/21  9:30 PM   Specimen: Nasopharyngeal Swab  Result Value Ref Range Status   SARS Coronavirus 2 NEGATIVE NEGATIVE Final    Comment: (NOTE) SARS-CoV-2 target nucleic acids are NOT DETECTED.  The SARS-CoV-2 RNA is generally detectable in upper and lower respiratory specimens during the acute phase of infection. Negative results do not preclude SARS-CoV-2 infection, do not rule out co-infections with other pathogens, and should not be used as the sole basis for treatment or other patient management decisions. Negative results must be combined with clinical observations, patient history, and epidemiological information. The expected result is Negative.  Fact Sheet for Patients: HairSlick.no  Fact Sheet for Healthcare Providers: quierodirigir.com  This test is not yet approved or cleared by the  [place] FDA and  has been authorized for detection and/or diagnosis of SARS-CoV-2 by FDA under an Emergency Use Authorization (EUA). This EUA will remain  in effect (meaning this test can be used) for the duration of the COVID-19 declaration under Se ction 564(b)(1) of the Act, 21 U.S.C. section 360bbb-3(b)(1), unless the authorization is terminated or revoked sooner.  Performed at [facility], [address], [place], [place] [address]     Procedures and diagnostic studies:  CT HEAD WO CONTRAST  Result Date: 03/05/2021 CLINICAL DATA:  Neuro deficit, acute, stroke suspected EXAM: CT HEAD WITHOUT CONTRAST TECHNIQUE: Contiguous axial images were obtained from the base of the skull through the vertex without intravenous contrast. COMPARISON:  02/17/2021 FINDINGS: Brain: No evidence of acute infarction, hemorrhage, hydrocephalus, extra-axial  collection or mass lesion/mass effect. Chronic changes related to prior right thalamic hemorrhage are unchanged. Small remote lacunar infarcts again noted in the pons and left corona radiata. Vascular: Atherosclerotic calcifications involving the large vessels of the skull base. No unexpected hyperdense vessel. Skull: Normal. Negative for fracture or focal lesion. Sinuses/Orbits: No acute finding. Other: None. IMPRESSION: No acute intracranial findings.  Stable exam from 02/17/2021. Electronically Signed   By: Duanne Guess D.O.   On: 03/05/2021 14:22   MR Brain Wo Contrast (neuro protocol)  Result Date: 03/05/2021 CLINICAL DATA:  Neuro deficit, acute, stroke suspected. EXAM: MRI HEAD WITHOUT CONTRAST TECHNIQUE: Multiplanar, multiecho pulse sequences of the brain and surrounding structures were obtained without intravenous contrast. COMPARISON:  Head CT 03/05/2021.  Brain MRI 07/05/2020 FINDINGS: Brain: Mild generalized cerebral and cerebellar atrophy. Redemonstrated chronic small-vessel infarcts within the bilateral corona radiata, left basal  ganglia, left thalamus, and within the pons. Redemonstrated sequela of remote hemorrhage within the right corona radiata/thalamus. 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 There is no acute infarct. No evidence of an intracranial mass. No extra-axial fluid collection. No midline shift. Vascular: Maintained flow voids within the proximal large arterial vessels. Skull and upper cervical spine: No focal suspicious marrow lesion Sinuses/Orbits: Visualized orbits show no acute finding. No significant paranasal sinus disease. IMPRESSION: No evidence of acute intracranial abnormality. Redemonstrated sequela of remote hemorrhage within the right corona radiata/thalamus. Redemonstrated chronic small-vessel infarcts within the bilateral corona radiata, left basal ganglia, left thalamus and within the pons. 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 Mild generalized parenchymal atrophy. Electronically Signed   By: Jackey Loge D.O.   On: 03/05/2021 16:01   DG Chest Portable 1 View  Result Date: 03/05/2021 CLINICAL DATA:  Fall, weakness EXAM: PORTABLE CHEST 1 VIEW COMPARISON:  02/22/2021 FINDINGS: There is interval development of right basilar consolidation possibly reflecting changes of acute lobar pneumonia or aspiration in the acute setting. Pulmonary insufflation is normal and symmetric. No pneumothorax or pleural effusion. Cardiac size within normal limits. Pulmonary vascularity is normal. No acute bone abnormality. IMPRESSION: Right basilar consolidation, new since prior examination, possibly reflecting changes of acute lobar pneumonia or aspiration. Electronically Signed   By: Helyn Numbers M.D.   On: 03/05/2021 21:18     Medications:    amitriptyline  25 mg Oral Daily   amLODipine  5 mg Oral Daily   aspirin EC  81 mg Oral Daily   atorvastatin  80 mg Oral QHS   carvedilol  25 mg Oral BID   dorzolamide-timolol  1 drop Both Eyes BID   DULoxetine  60 mg Oral Daily   escitalopram  5 mg Oral Daily   hydrochlorothiazide  25 mg Oral Daily   lamoTRIgine  25 mg Oral Daily   latanoprost  1 drop Both Eyes QHS   lisinopril  10 mg Oral Daily   sodium chloride flush  3 mL Intravenous Once   Continuous Infusions:   LOS: 0 days   Joseph Art  [facility]   How to contact the [facility] Attending or Consulting provider 7A - 7P or covering provider during after hours 7P -7A, for this patient?  Check the care team in [facility] and look for a) attending/consulting [facility] provider listed and b) the [facility] team listed Log into [email or web address] and use [facility]'s universal password  to access. If you do not have the password, please contact the hospital operator. Locate the [facility] provider you are looking for under [facility] and page to a number that you can be directly reached. If you still have difficulty reaching the provider, please page the [facility] (Director on Call) for the Hospitalists listed on amion for assistance.  03/06/2021, 9:38 AM

## 2021-03-06 NOTE — Evaluation (Signed)
Physical Therapy Evaluation Patient Details Name: Shirley Hansen MRN: [id number] DOB: Aug 14, 1965 Today's Date: 03/06/2021  History of Present Illness  Pt is a 55 y.o. F who presents with left sided weakness, paresthesia. MRI negative for acute CVA, UA negative. ?PNA as x ray shows right basilar consolidation. Significant PMH: CVA with left sided spastic hemiparesis, nontraumatic subcortical hemorrhage of right cerebral hemisphere, CAD, HTN, chronic anxiety/depression.  Clinical Impression  Pt admitted with above. Pt reports since a fall 02/12/2021 her mobility has been progressively declining; recently has been only able to perform stand pivot transfers and unable to ambulate. Pt requiring min assist for ambulating x 10 feet with a hemi walker. Presents as a high fall risk based on decreased gait speed and history of recurrent falls. Pt interested in post acute rehab. Would benefit from additional therapy to address strengthening, balance, gait training.      Recommendations for follow up therapy are one component of a multi-disciplinary discharge planning process, led by the attending physician.  Recommendations may be updated based on patient status, additional functional criteria and insurance authorization.  Follow Up Recommendations SNF    Equipment Recommendations  None recommended by PT (pt well equipped)    Recommendations for Other Services       Precautions / Restrictions Precautions Precautions: Fall;Other (comment) Precaution Comments: L spastic hemiplegia (chronic) Restrictions Weight Bearing Restrictions: No      Mobility  Bed Mobility Overal bed mobility: Needs Assistance Bed Mobility: Supine to Sit;Sit to Supine     Supine to sit: Min assist Sit to supine: Min assist   General bed mobility comments: Assist to bring trunk to upright, LE management back into stretcher due to elevated height    Transfers Overall transfer level: Needs assistance Equipment used:  Hemi-walker Transfers: Sit to/from Stand Sit to Stand: Min assist         General transfer comment: MinA to boost up to standing position from stretcher  Ambulation/Gait Ambulation/Gait assistance: Min guard Gait Distance (Feet): 10 Feet Assistive device: Hemi-walker Gait Pattern/deviations: Step-to pattern;Decreased stride length;Decreased dorsiflexion - left;Decreased weight shift to left Gait velocity: decreased Gait velocity interpretation: <1.8 ft/sec, indicate of risk for recurrent falls General Gait Details: Step to pattern, very slow pace, left knee held in slightly flexed position but no buckle. Min guard for Wellsite geologist    Modified Rankin (Stroke Patients Only)       Balance Overall balance assessment: Needs assistance Sitting-balance support: Feet supported Sitting balance-Leahy Scale: Poor Sitting balance - Comments: reliant on single UE support   Standing balance support: Single extremity supported Standing balance-Leahy Scale: Poor Standing balance comment: reliant on single UE support                             Pertinent Vitals/Pain Pain Assessment: Faces Faces Pain Scale: Hurts little more Pain Location: L posterior scapula in setting of fall 9/4 Pain Descriptors / Indicators: Grimacing;Guarding Pain Intervention(s): Monitored during session    Home Living Family/patient expects to be discharged to:: Private residence Living Arrangements: Children;Other relatives (51 y.o. daughter) Available Help at Discharge: Family;Available 24 hours/day Type of Home: House Home Access: Level entry     Home Layout: One level Home Equipment: Walker - 2 wheels;Shower seat;Bedside commode;Wheelchair - Careers adviser (comment)      Prior Function Level of Independence: Needs assistance   Gait / Transfers  Assistance Needed: pt modI with walker until 9/3 when she had a fall. Since then, performing stand pivot  transfers mostly to w/c or BSC  ADL's / Homemaking Assistance Needed: assist with shower transfers        Hand Dominance   Dominant Hand: Right    Extremity/Trunk Assessment   Upper Extremity Assessment Upper Extremity Assessment: Defer to OT evaluation    Lower Extremity Assessment Lower Extremity Assessment: RLE deficits/detail;LLE deficits/detail RLE Deficits / Details: Strength 5/5 LLE Deficits / Details: History of stroke with residual spastic hemiplegia. Knee extension 3/5, ankle dorsiflexion 2/5       Communication   Communication: No difficulties  Cognition Arousal/Alertness: Awake/alert Behavior During Therapy: WFL for tasks assessed/performed Overall Cognitive Status: Within Functional Limits for tasks assessed                                        General Comments      Exercises     Assessment/Plan    PT Assessment Patient needs continued PT services  PT Problem List         PT Treatment Interventions DME instruction;Gait training;Functional mobility training;Therapeutic activities;Therapeutic exercise;Balance training;Patient/family education    PT Goals (Current goals can be found in the Care Plan section)  Acute Rehab PT Goals Patient Stated Goal: to walk PT Goal Formulation: With patient Time For Goal Achievement: 03/20/21 Potential to Achieve Goals: Good    Frequency Min 2X/week   Barriers to discharge        Co-evaluation               AM-PAC PT "6 Clicks" Mobility  Outcome Measure Help needed turning from your back to your side while in a flat bed without using bedrails?: A Little Help needed moving from lying on your back to sitting on the side of a flat bed without using bedrails?: A Little Help needed moving to and from a bed to a chair (including a wheelchair)?: A Little Help needed standing up from a chair using your arms (e.g., wheelchair or bedside chair)?: A Little Help needed to walk in hospital  room?: A Little Help needed climbing 3-5 steps with a railing? : Total 6 Click Score: 16    End of Session Equipment Utilized During Treatment: Gait belt Activity Tolerance: Patient tolerated treatment well Patient left: in bed;with call bell/phone within reach Nurse Communication: Mobility status PT Visit Diagnosis: Unsteadiness on feet (R26.81);Other abnormalities of gait and mobility (R26.89);History of falling (Z91.81);Difficulty in walking, not elsewhere classified (R26.2)    Time: 7915-0569 PT Time Calculation (min) (ACUTE ONLY): 38 min   Charges:   PT Evaluation $PT Eval Moderate Complexity: 1 Mod          Lillia Pauls, PT, DPT Acute Rehabilitation Services Pager [phone] Office [phone]   Norval Morton 03/06/2021, 4:00 PM

## 2021-03-07 DIAGNOSIS — R531 Weakness: Secondary | ICD-10-CM | POA: Diagnosis not present

## 2021-03-07 MED ORDER — ALBUTEROL SULFATE (2.5 MG/3ML) 0.083% IN NEBU: 3.0000 mL | INHALATION_SOLUTION | RESPIRATORY_TRACT | Status: DC | PRN

## 2021-03-07 MED ORDER — BACLOFEN 10 MG PO TABS
5.0000 mg | ORAL_TABLET | Freq: Four times a day (QID) | ORAL | Status: DC
  Administered 2021-03-07 – 2021-03-12 (×19): 5 mg via ORAL
  Filled 2021-03-07 (×22): qty 1

## 2021-03-07 NOTE — ED Notes (Signed)
This RN and NT helped clean pt up. This RN helped pt get into a clean gown and changed her brief. Will continue to monitor.

## 2021-03-07 NOTE — Progress Notes (Signed)
Progress Note    Shirley Hansen  ZYS:[id number] DOB: 1966/04/06  DOA: 03/05/2021 PCP: Pcp, No    Brief Narrative:    Medical records reviewed and are as summarized below:  Shirley Hansen is an 55 y.o. female with medical history significant for CVA with left-sided spastic hemiparesis followed by neurology at [facility], muscle spasm of left lower extremity, nontraumatic subcortical hemorrhage of right cerebral hemisphere, muscle spasticity, history of hypokalemia, coronary artery disease with history of MI in 2017, essential hypertension, hyperlipidemia, chronic anxiety/depression, who presented to [facility] ED due to worsening left-sided weakness and paresthesia.   Assessment/Plan:   Active Problems:   Weakness   Left-sided weakness in the setting of prior CVA with left spastic hemiparesis Work-up unremarkable, MRI brain negative for acute CVA, UA negative.  Lab studies unremarkable. PT OT assessment Fall precautions TOC to assist with DC planning CIR consulted at patient's request.  doubt PNA -x ray shows: Right basilar consolidation, new since prior examination + cough for 2 weeks per patient, no fever, no WBC count -negative pro-calcitonin -denies swallowing issues   Hypertension, uncontrolled Resume home oral antihypertensives   Coronary artery disease status post MI in 2017 Resume home aspirin and Lipitor.  No anginal symptoms   Hyperlipidemia Resume home Lipitor   Chronic anxiety/depression Resume home regimen   Intractable headache Headache cocktail given, 1 dose of IV Toradol 15 mg x 1, 1 dose of IV Reglan 5 mg x 1, and 1 dose of IV Benadryl 12.5 mg x 1. -appears resolved   obesity Body mass index is 33.99 kg/m.   Family Communication/Anticipated D/C date and plan/Code Status   DVT prophylaxis: Lovenox ordered. Code Status: Full Code.  Disposition Plan: Status is: Observation  The patient will require care spanning > 2 midnights and should be moved to  inpatient because: Inpatient level of care appropriate due to severity of illness  Dispo:  Patient From: Home  Planned Disposition: PT recommends SNF- lives at home with 42 yo daughter/neice  Medically stable for discharge: No      Has appointment with PCP on Tuesday- [place]  Medical Consultants:   TOC   Subjective:   No current complaints- still in the ER awaiting a bed  Objective:    Vitals:   03/07/21 0645 03/07/21 0700 03/07/21 0715 03/07/21 0728  BP: 112/82 116/85 109/74   Pulse: 76 78 76   Resp: 13 12 11    Temp:    98.2 F (36.8 C)  TempSrc:    Oral  SpO2: 95% 95% 95%   Weight:      Height:       No intake or output data in the 24 hours ending 03/07/21 1045  Filed Weights   03/05/21 1431  Weight: 89.8 kg    Exam:  General: Appearance:    Obese female in no acute distress     Lungs:     respirations unlabored  Heart:    Normal heart rate.    MS:   All extremities are intact.    Neurologic:   Awake, alert, oriented x 3       Data Reviewed:   I have personally reviewed following labs and imaging studies:  Labs: Labs show the following:   Basic Metabolic Panel: Recent Labs  Lab 03/05/21 1332 03/05/21 1631 03/06/21 0500  NA 139  --  140  K 3.4*  --  3.6  CL 102  --  103  CO2 28  --  28  GLUCOSE 125*  --  117*  BUN 10  --  8  CREATININE 1.03*  --  1.05*  CALCIUM 9.4  --  9.3  MG  --  2.1  --   PHOS  --  3.2  --    GFR Estimated Creatinine Clearance: 65.7 mL/min (A) (by C-G formula based on SCr of 1.05 mg/dL (H)). Liver Function Tests: Recent Labs  Lab 03/05/21 1332  AST 20  ALT 22  ALKPHOS 67  BILITOT 0.5  PROT 7.3  ALBUMIN 3.4*   No results for input(s): LIPASE, AMYLASE in the last 168 hours. No results for input(s): AMMONIA in the last 168 hours. Coagulation profile Recent Labs  Lab 03/05/21 1332  INR 0.9    CBC: Recent Labs  Lab 03/05/21 1332  WBC 6.7  NEUTROABS 4.1  HGB 13.5  HCT 41.3  MCV 89.6  PLT 435*    Cardiac Enzymes: No results for input(s): CKTOTAL, CKMB, CKMBINDEX, TROPONINI in the last 168 hours. BNP (last 3 results) No results for input(s): PROBNP in the last 8760 hours. CBG: Recent Labs  Lab 03/05/21 1319 03/05/21 1627  GLUCAP 125* 134*   D-Dimer: No results for input(s): DDIMER in the last 72 hours. Hgb A1c: No results for input(s): HGBA1C in the last 72 hours. Lipid Profile: No results for input(s): CHOL, HDL, LDLCALC, TRIG, CHOLHDL, LDLDIRECT in the last 72 hours. Thyroid function studies: No results for input(s): TSH, T4TOTAL, T3FREE, THYROIDAB in the last 72 hours.  Invalid input(s): FREET3 Anemia work up: No results for input(s): VITAMINB12, FOLATE, FERRITIN, TIBC, IRON, RETICCTPCT in the last 72 hours. Sepsis Labs: Recent Labs  Lab 03/05/21 1332 03/06/21 0527  PROCALCITON  --  <0.10  WBC 6.7  --     Microbiology Recent Results (from the past 240 hour(s))  SARS CORONAVIRUS 2 (TAT 6-24 HRS) Nasopharyngeal Nasopharyngeal Swab     Status: None   Collection Time: 03/05/21  9:30 PM   Specimen: Nasopharyngeal Swab  Result Value Ref Range Status   SARS Coronavirus 2 NEGATIVE NEGATIVE Final    Comment: (NOTE) SARS-CoV-2 target nucleic acids are NOT DETECTED.  The SARS-CoV-2 RNA is generally detectable in upper and lower respiratory specimens during the acute phase of infection. Negative results do not preclude SARS-CoV-2 infection, do not rule out co-infections with other pathogens, and should not be used as the sole basis for treatment or other patient management decisions. Negative results must be combined with clinical observations, patient history, and epidemiological information. The expected result is Negative.  Fact Sheet for Patients: HairSlick.no  Fact Sheet for Healthcare Providers: quierodirigir.com  This test is not yet approved or cleared by the [place] FDA and  has been  authorized for detection and/or diagnosis of SARS-CoV-2 by FDA under an Emergency Use Authorization (EUA). This EUA will remain  in effect (meaning this test can be used) for the duration of the COVID-19 declaration under Se ction 564(b)(1) of the Act, 21 U.S.C. section 360bbb-3(b)(1), unless the authorization is terminated or revoked sooner.  Performed at [facility], [address] [place], [place] [address]     Procedures and diagnostic studies:  CT HEAD WO CONTRAST  Result Date: 03/05/2021 CLINICAL DATA:  Neuro deficit, acute, stroke suspected EXAM: CT HEAD WITHOUT CONTRAST TECHNIQUE: Contiguous axial images were obtained from the base of the skull through the vertex without intravenous contrast. COMPARISON:  02/17/2021 FINDINGS: Brain: No evidence of acute infarction, hemorrhage, hydrocephalus, extra-axial collection or mass lesion/mass effect.  Chronic changes related to prior right thalamic hemorrhage are unchanged. Small remote lacunar infarcts again noted in the pons and left corona radiata. Vascular: Atherosclerotic calcifications involving the large vessels of the skull base. No unexpected hyperdense vessel. Skull: Normal. Negative for fracture or focal lesion. Sinuses/Orbits: No acute finding. Other: None. IMPRESSION: No acute intracranial findings.  Stable exam from 02/17/2021. Electronically Signed   By: Duanne Guess D.O.   On: 03/05/2021 14:22   MR Brain Wo Contrast (neuro protocol)  Result Date: 03/05/2021 CLINICAL DATA:  Neuro deficit, acute, stroke suspected. EXAM: MRI HEAD WITHOUT CONTRAST TECHNIQUE: Multiplanar, multiecho pulse sequences of the brain and surrounding structures were obtained without intravenous contrast. COMPARISON:  Head CT 03/05/2021.  Brain MRI 07/05/2020 FINDINGS: Brain: Mild generalized cerebral and cerebellar atrophy. Redemonstrated chronic small-vessel infarcts within the bilateral corona radiata, left basal ganglia, left thalamus, and within  the pons. Redemonstrated sequela of remote hemorrhage within the right corona radiata/thalamus. 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 There is no acute infarct. No evidence of an intracranial mass. No extra-axial fluid collection. No midline shift. Vascular: Maintained flow voids within the proximal large arterial vessels. Skull and upper cervical spine: No focal suspicious marrow lesion Sinuses/Orbits: Visualized orbits show no acute finding. No significant paranasal sinus disease. IMPRESSION: No evidence of acute intracranial abnormality. Redemonstrated sequela of remote hemorrhage within the right corona radiata/thalamus. Redemonstrated chronic small-vessel infarcts within the bilateral corona radiata, left basal ganglia, left thalamus and within the pons. 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 Mild generalized parenchymal atrophy. Electronically Signed   By: Jackey Loge D.O.   On: 03/05/2021 16:01   DG Chest Portable 1 View  Result Date: 03/05/2021 CLINICAL DATA:  Fall, weakness EXAM: PORTABLE CHEST 1 VIEW COMPARISON:  02/22/2021 FINDINGS: There is interval development of right basilar consolidation possibly reflecting changes of acute lobar pneumonia or aspiration in the acute setting. Pulmonary insufflation is normal and symmetric. No pneumothorax or pleural effusion. Cardiac size within normal limits. Pulmonary vascularity is normal. No acute bone abnormality. IMPRESSION: Right basilar consolidation, new since prior examination, possibly reflecting changes of acute lobar pneumonia or aspiration. Electronically Signed   By: Helyn Numbers M.D.   On: 03/05/2021 21:18    Medications:    amitriptyline  25 mg Oral  Daily   amLODipine  5 mg Oral Daily   aspirin EC  81 mg Oral Daily   atorvastatin  80 mg Oral QHS   carvedilol  25 mg Oral BID   dorzolamide-timolol  1 drop Both Eyes BID   DULoxetine  60 mg Oral Daily   escitalopram  5 mg Oral Daily   hydrochlorothiazide  25 mg Oral Daily   lamoTRIgine  25 mg Oral Daily   latanoprost  1 drop Both Eyes QHS   lisinopril  10 mg Oral Daily   sodium chloride flush  3 mL Intravenous Once   Continuous Infusions:   LOS: 0 days   Joseph Art  [facility]   How to contact the [facility] Attending or Consulting provider 7A - 7P or covering provider during after hours 7P -7A, for this patient?  Check the care team in [facility] and look for a) attending/consulting [facility] provider listed and b) the [facility] team listed Log into [email or web address] and use [place]'s universal password to access. If you do  not have the password, please contact the hospital operator. Locate the [facility] provider you are looking for under [facility] and page to a number that you can be directly reached. If you still have difficulty reaching the provider, please page the [facility] (Director on Call) for the Hospitalists listed on amion for assistance.  03/07/2021, 10:45 AM

## 2021-03-07 NOTE — Progress Notes (Signed)
Inpatient Rehab Admissions Coordinator:   Consult received.  At this time, note imaging negative for acute process and pt does not appear to have the medical necessity to support a CIR admission.  Agree with SNF recommendations from PT.  Will sign off at this time.   Estill Dooms, PT, DPT Admissions Coordinator [phone] 03/07/21  10:30 AM

## 2021-03-08 ENCOUNTER — Ambulatory Visit: Payer: Medicaid Other | Admitting: Family Medicine

## 2021-03-08 DIAGNOSIS — I951 Orthostatic hypotension: Secondary | ICD-10-CM | POA: Diagnosis present

## 2021-03-08 DIAGNOSIS — Z87891 Personal history of nicotine dependence: Secondary | ICD-10-CM | POA: Diagnosis not present

## 2021-03-08 DIAGNOSIS — E785 Hyperlipidemia, unspecified: Secondary | ICD-10-CM | POA: Diagnosis present

## 2021-03-08 DIAGNOSIS — W010XXA Fall on same level from slipping, tripping and stumbling without subsequent striking against object, initial encounter: Secondary | ICD-10-CM | POA: Diagnosis present

## 2021-03-08 DIAGNOSIS — Z8249 Family history of ischemic heart disease and other diseases of the circulatory system: Secondary | ICD-10-CM | POA: Diagnosis not present

## 2021-03-08 DIAGNOSIS — Z20822 Contact with and (suspected) exposure to covid-19: Secondary | ICD-10-CM | POA: Diagnosis present

## 2021-03-08 DIAGNOSIS — I2510 Atherosclerotic heart disease of native coronary artery without angina pectoris: Secondary | ICD-10-CM | POA: Diagnosis present

## 2021-03-08 DIAGNOSIS — E876 Hypokalemia: Secondary | ICD-10-CM | POA: Diagnosis present

## 2021-03-08 DIAGNOSIS — R531 Weakness: Secondary | ICD-10-CM | POA: Diagnosis not present

## 2021-03-08 DIAGNOSIS — F32A Depression, unspecified: Secondary | ICD-10-CM | POA: Diagnosis present

## 2021-03-08 DIAGNOSIS — N1831 Chronic kidney disease, stage 3a: Secondary | ICD-10-CM | POA: Diagnosis present

## 2021-03-08 DIAGNOSIS — I252 Old myocardial infarction: Secondary | ICD-10-CM | POA: Diagnosis not present

## 2021-03-08 DIAGNOSIS — Z8616 Personal history of COVID-19: Secondary | ICD-10-CM | POA: Diagnosis not present

## 2021-03-08 DIAGNOSIS — F419 Anxiety disorder, unspecified: Secondary | ICD-10-CM | POA: Diagnosis present

## 2021-03-08 DIAGNOSIS — R519 Headache, unspecified: Secondary | ICD-10-CM | POA: Diagnosis not present

## 2021-03-08 DIAGNOSIS — Z79899 Other long term (current) drug therapy: Secondary | ICD-10-CM | POA: Diagnosis not present

## 2021-03-08 DIAGNOSIS — E669 Obesity, unspecified: Secondary | ICD-10-CM | POA: Diagnosis present

## 2021-03-08 DIAGNOSIS — Z6833 Body mass index (BMI) 33.0-33.9, adult: Secondary | ICD-10-CM | POA: Diagnosis not present

## 2021-03-08 DIAGNOSIS — M47814 Spondylosis without myelopathy or radiculopathy, thoracic region: Secondary | ICD-10-CM | POA: Diagnosis present

## 2021-03-08 DIAGNOSIS — I69154 Hemiplegia and hemiparesis following nontraumatic intracerebral hemorrhage affecting left non-dominant side: Secondary | ICD-10-CM | POA: Diagnosis not present

## 2021-03-08 DIAGNOSIS — I129 Hypertensive chronic kidney disease with stage 1 through stage 4 chronic kidney disease, or unspecified chronic kidney disease: Secondary | ICD-10-CM | POA: Diagnosis present

## 2021-03-08 DIAGNOSIS — Z7982 Long term (current) use of aspirin: Secondary | ICD-10-CM | POA: Diagnosis not present

## 2021-03-08 MED ORDER — MECLIZINE HCL 25 MG PO TABS
25.0000 mg | ORAL_TABLET | Freq: Three times a day (TID) | ORAL | Status: DC | PRN
  Administered 2021-03-10 – 2021-03-13 (×3): 25 mg via ORAL
  Filled 2021-03-08 (×3): qty 1

## 2021-03-08 NOTE — NC FL2 (Signed)
MEDICAID FL2 LEVEL OF CARE SCREENING TOOL     IDENTIFICATION  Patient Name: Shirley Hansen Birthdate: 1966/05/27 Sex: female Admission Date (Current Location): 03/05/2021  [place] and [place] Number:  Haynes Bast [id number] L Facility and Address:  [facility], [address] [place], [place] [address]      Provider Number: [id number]  Attending Physician Name and Address:  Joseph Art, DO  Relative Name and Phone Number:  Wendee Beavers Niece [phone]    Current Level of Care: Hospital Recommended Level of Care: Skilled Nursing Facility Prior Approval Number:    Date Approved/Denied:   PASRR Number: [id number]  Discharge Plan: SNF    Current Diagnoses: Patient Active Problem List   Diagnosis Date Noted   Weakness 03/05/2021   COVID-19 07/14/2020   Stroke (HCC)    Stage 3a chronic kidney disease (HCC)    Hypertension     Orientation RESPIRATION BLADDER Height & Weight     Self, Time, Situation, Place  Normal Continent Weight: 198 lb (89.8 kg) Height:  5\' 4"  (162.6 cm)  BEHAVIORAL SYMPTOMS/MOOD NEUROLOGICAL BOWEL NUTRITION STATUS      Continent Diet (see discharge summary)  AMBULATORY STATUS COMMUNICATION OF NEEDS Skin   Supervision Verbally Normal                       Personal Care Assistance Level of Assistance  Bathing, Feeding, Dressing Bathing Assistance: Limited assistance Feeding assistance: Limited assistance Dressing Assistance: Limited assistance     Functional Limitations Info  Sight, Hearing, Speech Sight Info: Adequate Hearing Info: Adequate Speech Info: Adequate    SPECIAL CARE FACTORS FREQUENCY  PT (By licensed PT), OT (By licensed OT)     PT Frequency: 5x week OT Frequency: 5x week            Contractures Contractures Info: Not present    Additional Factors Info  Code Status, Allergies Code Status Info: full Allergies Info: NKA           Current Medications (03/08/2021):  This  is the current hospital active medication list Current Facility-Administered Medications  Medication Dose Route Frequency Provider Last Rate Last Admin   acetaminophen (TYLENOL) tablet 650 mg  650 mg Oral Q6H PRN 03/10/2021 U, DO   650 mg at 03/06/21 1002   albuterol (PROVENTIL) (2.5 MG/3ML) 0.083% nebulizer solution 3 mL  3 mL Inhalation Q2H PRN 03/08/21 U, DO       amitriptyline (ELAVIL) tablet 25 mg  25 mg Oral Daily [place], Carole N, DO   25 mg at 03/08/21 0906   amLODipine (NORVASC) tablet 5 mg  5 mg Oral Daily 03/10/21 N, DO   5 mg at 03/08/21 03/10/21   aspirin EC tablet 81 mg  81 mg Oral Daily 2409, DO   81 mg at 03/08/21 0906   atorvastatin (LIPITOR) tablet 80 mg  80 mg Oral QHS 03/10/21 N, DO   80 mg at 03/07/21 2131   baclofen (LIORESAL) tablet 5 mg  5 mg Oral QID 2132 U, DO   5 mg at 03/08/21 0907   carvedilol (COREG) tablet 25 mg  25 mg Oral BID 03/10/21 N, DO   25 mg at 03/08/21 0906   dorzolamide-timolol (COSOPT) 22.3-6.8 MG/ML ophthalmic solution 1 drop  1 drop Both Eyes BID 03/10/21 N, DO   1 drop at 03/08/21 0919   DULoxetine (CYMBALTA) DR capsule 60 mg  60  mg Oral Daily Darlin Drop, DO   60 mg at 03/08/21 1027   escitalopram (LEXAPRO) tablet 5 mg  5 mg Oral Daily Darlin Drop, DO   5 mg at 03/08/21 2536   lamoTRIgine (LAMICTAL) tablet 25 mg  25 mg Oral Daily Cathie Hoops, RPH   25 mg at 03/08/21 0907   latanoprost (XALATAN) 0.005 % ophthalmic solution 1 drop  1 drop Both Eyes QHS Dow Adolph N, DO   1 drop at 03/07/21 2131   lisinopril (ZESTRIL) tablet 10 mg  10 mg Oral Daily Darlin Drop, DO   10 mg at 03/08/21 6440   meclizine (ANTIVERT) tablet 25 mg  25 mg Oral TID PRN Joseph Art, DO       metoprolol tartrate (LOPRESSOR) injection 2.5 mg  2.5 mg Intravenous Q6H PRN Dow Adolph N, DO       oxyCODONE (Oxy IR/ROXICODONE) immediate release tablet 5 mg  5 mg Oral Q6H PRN Dow Adolph N, DO   5 mg at 03/07/21 2135    polyvinyl alcohol (LIQUIFILM TEARS) 1.4 % ophthalmic solution 1 drop  1 drop Both Eyes PRN Cathie Hoops, RPH       sodium chloride flush (NS) 0.9 % injection 3 mL  3 mL Intravenous Once [place], Clabe Seal, DO         Discharge Medications: Please see discharge summary for a list of discharge medications.  Relevant Imaging Results:  Relevant Lab Results:   Additional Information SSN: [id number] Has only recieved two doses of the Pfizer vaccine on 07/05/20, 01/05/21  Lorri Frederick, LCSW

## 2021-03-08 NOTE — TOC Initial Note (Signed)
Transition of Care [facility]) - Initial/Assessment Note    Patient Details  Name: Shirley Hansen MRN: [id number] Date of Birth: 08-24-1965  [facility] CM/SW Contact:    Joanne Chars, LCSW Phone Number: 03/08/2021, 3:34 PM  Clinical Narrative:  CSW met with pt regarding recommendation for SNF.  Pt agreeable to this, choice document given, permission given to send out referral in hub.  Permission given to speak with Niece [place].  Pt interested in [facility].  Current DME in home: walker, wheelchair. No PCP in place as she moved here recently.  Pt is vaccinated for covid but not boosted.                  Expected Discharge Plan: Skilled Nursing Facility Barriers to Discharge: SNF Pending bed offer, Continued Medical Work up   Patient Goals and CMS Choice Patient states their goals for this hospitalization and ongoing recovery are:: get back walking CMS Medicare.gov Compare Post Acute Care list provided to:: Patient    Expected Discharge Plan and Services Expected Discharge Plan: [place] In-house Referral: Clinical Social Work   Post Acute Care Choice: [place] Living arrangements for the past 2 months: [place]                                      Prior Living Arrangements/Services Living arrangements for the past 2 months: Single Family Home Lives with:: Relatives (niece and brother in [place])   Do you feel safe going back to the place where you live?: Yes      Need for Family Participation in Patient Care: No (Comment) Care giver support system in place?: Yes (comment) Current home services: Other (comment) (none) Criminal Activity/Legal Involvement Pertinent to Current Situation/Hospitalization: No - Comment as needed  Activities of Daily Living      Permission Sought/Granted Permission sought to share information with : Family Supports Permission granted to share information with : Yes, Verbal Permission  Granted  Share Information with NAME: niece Chipper Herb  Permission granted to share info w AGENCY: SNF        Emotional Assessment Appearance:: Appears older than stated age Attitude/Demeanor/Rapport: Engaged Affect (typically observed): Appropriate, Pleasant Orientation: : Oriented to Self, Oriented to Place, Oriented to  Time, Oriented to Situation Alcohol / Substance Use: Not Applicable Psych Involvement: No (comment)  Admission diagnosis:  Neuropathy [G62.9] Weakness [R53.1] Patient Active Problem List   Diagnosis Date Noted   Weakness 03/05/2021   COVID-19 07/14/2020   Stroke ([place])    Stage 3a chronic kidney disease ([place])    Hypertension    PCP:  Pcp, No Pharmacy:   [id number] [address] [place] [address] Phone: [phone] Fax: [phone]     Social Determinants of Health (SDOH) Interventions    Readmission Risk Interventions No flowsheet data found.

## 2021-03-08 NOTE — Progress Notes (Addendum)
Progress Note    Shirley Hansen  URK:[id number] DOB: [birth year]  DOA: 03/05/2021 PCP: Pcp, No    Brief Narrative:    Medical records reviewed and are as summarized below:  Shirley Hansen is an 55 y.o. female with medical history significant for CVA with left-sided spastic hemiparesis followed by neurology at [facility], muscle spasm of left lower extremity, nontraumatic subcortical hemorrhage of right cerebral hemisphere, muscle spasticity, history of hypokalemia, coronary artery disease with history of MI in 2017, essential hypertension, hyperlipidemia, chronic anxiety/depression, who presented to [facility] ED due to worsening left-sided weakness and paresthesia.   Await TOC evaluation.    Assessment/Plan:   Active Problems:   Weakness   Left-sided weakness in the setting of prior CVA with left spastic hemiparesis with dizziness Work-up unremarkable, MRI brain negative for acute CVA, UA negative.  Lab studies unremarkable. PT OT assessment- SNF Fall precautions TOC to assist with DC planning -check orthos CIR consulted at patient's request  but do not think she is appropriate -patient has appointment at [facility] that needs to be re-scheduled -resume meclizine  doubt PNA -x ray shows: Right basilar consolidation, new since prior examination - no fever, no WBC count -negative pro-calcitonin -denies swallowing issues   Hypertension, uncontrolled Resume home oral antihypertensives   Coronary artery disease status post MI in 2017 Resume home aspirin and Lipitor.  No anginal symptoms   Hyperlipidemia Resume home Lipitor   Chronic anxiety/depression Resume home regimen   Intractable headache Headache cocktail given, 1 dose of IV Toradol 15 mg x 1, 1 dose of IV Reglan 5 mg x 1, and 1 dose of IV Benadryl 12.5 mg x 1. -appears resolved   obesity Body mass index is 33.99 kg/m.   Family Communication/Anticipated D/C date and plan/Code Status   DVT prophylaxis: Lovenox  ordered. Code Status: Full Code.  Disposition Plan: Status is: Observation  The patient will require care spanning > 2 midnights and should be moved to inpatient because: Inpatient level of care appropriate due to severity of illness  Dispo:  Patient From: Home  Planned Disposition: PT recommends SNF- lives at home with 47 yo daughter/neice  Medically stable for discharge: No      Has appointment with PCP on Tuesday- [place]-- will need to be re-scheduled  Medical Consultants:   TOC   Subjective:   C/o dizziness while sitting in chair  Objective:    Vitals:   03/07/21 1913 03/07/21 1913 03/07/21 2128 03/07/21 2130  BP:  120/84 113/81 113/81  Pulse:  89 94 97  Resp:   18   Temp: 98.1 F (36.7 C)  98.2 F (36.8 C)   TempSrc: Oral  Oral   SpO2: 100% 100% 94% 96%  Weight:      Height:        Intake/Output Summary (Last 24 hours) at 03/08/2021 0857 Last data filed at 03/07/2021 2128 Gross per 24 hour  Intake 120 ml  Output --  Net 120 ml    Filed Weights   03/05/21 1431  Weight: 89.8 kg    Exam:  General: Appearance:    Obese female in no acute distress, sitting in chair     Lungs:      respirations unlabored  Heart:    Normal heart rate.       Neurologic:   Awake, alert, oriented x 3. Left hemiparesis          Data Reviewed:   I have personally reviewed following labs and  imaging studies:  Labs: Labs show the following:   Basic Metabolic Panel: Recent Labs  Lab 03/05/21 1332 03/05/21 1631 03/06/21 0500  NA 139  --  140  K 3.4*  --  3.6  CL 102  --  103  CO2 28  --  28  GLUCOSE 125*  --  117*  BUN 10  --  8  CREATININE 1.03*  --  1.05*  CALCIUM 9.4  --  9.3  MG  --  2.1  --   PHOS  --  3.2  --    GFR Estimated Creatinine Clearance: 65.7 mL/min (A) (by C-G formula based on SCr of 1.05 mg/dL (H)). Liver Function Tests: Recent Labs  Lab 03/05/21 1332  AST 20  ALT 22  ALKPHOS 67  BILITOT 0.5  PROT 7.3  ALBUMIN 3.4*   No  results for input(s): LIPASE, AMYLASE in the last 168 hours. No results for input(s): AMMONIA in the last 168 hours. Coagulation profile Recent Labs  Lab 03/05/21 1332  INR 0.9    CBC: Recent Labs  Lab 03/05/21 1332  WBC 6.7  NEUTROABS 4.1  HGB 13.5  HCT 41.3  MCV 89.6  PLT 435*   Cardiac Enzymes: No results for input(s): CKTOTAL, CKMB, CKMBINDEX, TROPONINI in the last 168 hours. BNP (last 3 results) No results for input(s): PROBNP in the last 8760 hours. CBG: Recent Labs  Lab 03/05/21 1319 03/05/21 1627  GLUCAP 125* 134*   D-Dimer: No results for input(s): DDIMER in the last 72 hours. Hgb A1c: No results for input(s): HGBA1C in the last 72 hours. Lipid Profile: No results for input(s): CHOL, HDL, LDLCALC, TRIG, CHOLHDL, LDLDIRECT in the last 72 hours. Thyroid function studies: No results for input(s): TSH, T4TOTAL, T3FREE, THYROIDAB in the last 72 hours.  Invalid input(s): FREET3 Anemia work up: No results for input(s): VITAMINB12, FOLATE, FERRITIN, TIBC, IRON, RETICCTPCT in the last 72 hours. Sepsis Labs: Recent Labs  Lab 03/05/21 1332 03/06/21 0527  PROCALCITON  --  <0.10  WBC 6.7  --     Microbiology Recent Results (from the past 240 hour(s))  SARS CORONAVIRUS 2 (TAT 6-24 HRS) Nasopharyngeal Nasopharyngeal Swab     Status: None   Collection Time: 03/05/21  9:30 PM   Specimen: Nasopharyngeal Swab  Result Value Ref Range Status   SARS Coronavirus 2 NEGATIVE NEGATIVE Final    Comment: (NOTE) SARS-CoV-2 target nucleic acids are NOT DETECTED.  The SARS-CoV-2 RNA is generally detectable in upper and lower respiratory specimens during the acute phase of infection. Negative results do not preclude SARS-CoV-2 infection, do not rule out co-infections with other pathogens, and should not be used as the sole basis for treatment or other patient management decisions. Negative results must be combined with clinical observations, patient history, and  epidemiological information. The expected result is Negative.  Fact Sheet for Patients: HairSlick.no  Fact Sheet for Healthcare Providers: quierodirigir.com  This test is not yet approved or cleared by the [place] FDA and  has been authorized for detection and/or diagnosis of SARS-CoV-2 by FDA under an Emergency Use Authorization (EUA). This EUA will remain  in effect (meaning this test can be used) for the duration of the COVID-19 declaration under Se ction 564(b)(1) of the Act, 21 U.S.C. section 360bbb-3(b)(1), unless the authorization is terminated or revoked sooner.  Performed at [facility], [address], [place], [place] [address]     Procedures and diagnostic studies:  No results found.  Medications:  amitriptyline  25 mg Oral Daily   amLODipine  5 mg Oral Daily   aspirin EC  81 mg Oral Daily   atorvastatin  80 mg Oral QHS   baclofen  5 mg Oral QID   carvedilol  25 mg Oral BID   dorzolamide-timolol  1 drop Both Eyes BID   DULoxetine  60 mg Oral Daily   escitalopram  5 mg Oral Daily   lamoTRIgine  25 mg Oral Daily   latanoprost  1 drop Both Eyes QHS   lisinopril  10 mg Oral Daily   sodium chloride flush  3 mL Intravenous Once   Continuous Infusions:   LOS: 0 days   Joseph Art  [facility]   How to contact the [facility] Attending or Consulting provider 7A - 7P or covering provider during after hours 7P -7A, for this patient?  Check the care team in [facility] and look for a) attending/consulting TR[facility] provider listed and b) the [facility] team listed Log into [email or web address] and use [place]'s universal password to access. If you do not have the password, please contact the hospital operator. Locate the [facility] provider you are looking for under [facility] and page to a number that you can be directly reached. If you still have difficulty reaching the provider, please page the [facility] (Director on  Call) for the Hospitalists listed on amion for assistance.  03/08/2021, 8:57 AM

## 2021-03-08 NOTE — Progress Notes (Signed)
Physical Therapy Treatment Patient Details Name: Shirley Hansen MRN: [id number] DOB: 21-Feb-1966 Today's Date: 03/08/2021   History of Present Illness Pt is a 55 y.o. F who presents with left sided weakness, paresthesia. MRI negative for acute CVA, UA negative. ?PNA as x ray shows right basilar consolidation. Significant PMH: CVA with left sided spastic hemiparesis, nontraumatic subcortical hemorrhage of right cerebral hemisphere, CAD, HTN, chronic anxiety/depression.    PT Comments    Pt very pleasant in chair on arrival and end of session. Pt able to advance gait this session and reports her RW at home is too wide compared to standard in room. Pt limited by fatigue and reports she still feels strength is not back to baseline and wanting ST-SNF. Encouraged up to chair and toilet throughout the day with nursing as well as continued HEP.     Recommendations for follow up therapy are one component of a multi-disciplinary discharge planning process, led by the attending physician.  Recommendations may be updated based on patient status, additional functional criteria and insurance authorization.  Follow Up Recommendations  SNF     Equipment Recommendations  None recommended by PT    Recommendations for Other Services       Precautions / Restrictions Precautions Precautions: Fall;Other (comment) Precaution Comments: L spastic hemiplegia (chronic) Restrictions Weight Bearing Restrictions: No     Mobility  Bed Mobility Overal bed mobility: Needs Assistance Bed Mobility: Supine to Sit     Supine to sit: Min assist     General bed mobility comments: in chair on arrival and end of session    Transfers Overall transfer level: Needs assistance Equipment used: 1 person hand held assist Transfers: Sit to/from Stand Sit to Stand: Min assist Stand pivot transfers: Mod assist       General transfer comment: min assist with increased time to rise from recliner with armrest x 2  trials  Ambulation/Gait Ambulation/Gait assistance: Min guard Gait Distance (Feet): 20 Feet Assistive device: Rolling walker (2 wheeled) Gait Pattern/deviations: Step-to pattern;Decreased stance time - left;Decreased stride length   Gait velocity interpretation: <1.8 ft/sec, indicate of risk for recurrent falls General Gait Details: pt able to grasp and control RW with bil UE with pt with spasticity of LLE but able to advance gait 20' x 2 trials with seated rest between due to fatigue   Stairs             Wheelchair Mobility    Modified Rankin (Stroke Patients Only)       Balance Overall balance assessment: Needs assistance Sitting-balance support: Feet supported Sitting balance-Leahy Scale: Fair Sitting balance - Comments: sitting with armrest support   Standing balance support: Bilateral upper extremity supported;During functional activity Standing balance-Leahy Scale: Poor Standing balance comment: RW for standing and gait                            Cognition Arousal/Alertness: Awake/alert Behavior During Therapy: WFL for tasks assessed/performed Overall Cognitive Status: Within Functional Limits for tasks assessed                                 General Comments: Pt states she has some mild memory deficits.      Exercises General Exercises - Lower Extremity Long Arc Quad: AROM;Both;Seated;15 reps Hip Flexion/Marching: AROM;Both;Seated;15 reps Other Exercises Other Exercises: L scapular mobilizations followed by ROM to  L shoulder to increase  ROM and functional use of L arm.    General Comments General comments (skin integrity, edema, etc.): Pt with long standing spasticity in L side.      Pertinent Vitals/Pain Pain Assessment: Faces Faces Pain Scale: Hurts a little bit Pain Location: left upper trap Pain Descriptors / Indicators: Grimacing;Guarding Pain Intervention(s): Limited activity within patient's tolerance;Monitored  during session;Repositioned;Other (comment) (massage)    Home Living Family/patient expects to be discharged to:: Private residence Living Arrangements: Children;Other relatives Available Help at Discharge: Family;Available 24 hours/day Type of Home: House Home Access: Level entry   Home Layout: One level Home Equipment: Walker - 2 wheels;Shower seat;Bedside commode;Wheelchair - manual;Other (comment) Additional Comments: Pt primarily sponge bathes in bedroom.  Bedroom is upstairs with 11 steps.  Pt stays upstairs all day, for all meals etc and only comes down when they go out to [place] or take an outing.    Prior Function Level of Independence: Needs assistance  Gait / Transfers Assistance Needed: pt modI with walker until 9/3 when she had a fall. Since then, performing stand pivot transfers mostly to w/c or BSC ADL's / Homemaking Assistance Needed: assist into shower but has not showered since 9/3. Sponge bathes only.  Pt can dress self, sponge bathe and was pivot transferring to toilet without assist prior to admit.     PT Goals (current goals can now be found in the care plan section) Acute Rehab PT Goals Patient Stated Goal: to walk Progress towards PT goals: Progressing toward goals    Frequency    Min 2X/week      PT Plan Current plan remains appropriate    Co-evaluation              AM-PAC PT "6 Clicks" Mobility   Outcome Measure  Help needed turning from your back to your side while in a flat bed without using bedrails?: A Little Help needed moving from lying on your back to sitting on the side of a flat bed without using bedrails?: A Little Help needed moving to and from a bed to a chair (including a wheelchair)?: A Little Help needed standing up from a chair using your arms (e.g., wheelchair or bedside chair)?: A Little Help needed to walk in hospital room?: A Little Help needed climbing 3-5 steps with a railing? : A Lot 6 Click Score: 17    End of  Session   Activity Tolerance: Patient tolerated treatment well Patient left: in chair;with call bell/phone within reach;with chair alarm set Nurse Communication: Mobility status PT Visit Diagnosis: Unsteadiness on feet (R26.81);Other abnormalities of gait and mobility (R26.89);History of falling (Z91.81);Difficulty in walking, not elsewhere classified (R26.2)     Time: 8841-6606 PT Time Calculation (min) (ACUTE ONLY): 19 min  Charges:  $Gait Training: 8-22 mins                     Merryl Hacker, PT Acute Rehabilitation Services Pager: [phone] Office: [phone]    Makayela Secrest B Marisha Renier 03/08/2021, 11:30 AM

## 2021-03-08 NOTE — Evaluation (Signed)
Occupational Therapy Evaluation Patient Details Name: Shirley Hansen MRN: [id number] DOB: Nov 25, 1965 Today's Date: 03/08/2021   History of Present Illness Pt is a 55 y.o. F who presents with left sided weakness, paresthesia. MRI negative for acute CVA, UA negative. ?PNA as x ray shows right basilar consolidation. Significant PMH: CVA with left sided spastic hemiparesis, nontraumatic subcortical hemorrhage of right cerebral hemisphere, CAD, HTN, chronic anxiety/depression.   Clinical Impression   Pt admitted with the above diagnosis and has the deficits outlined below. Pt would benefit from cont OT to attempt to reach baseline status with bathing, adl transfers and toileting.  Pt was much more mobile prior to a fall sustained on 9/3.  Feel a short rehab stay may allow pt to reach this pre fall level of functioning. Pt lives with daughter (niece) and has great support.       Recommendations for follow up therapy are one component of a multi-disciplinary discharge planning process, led by the attending physician.  Recommendations may be updated based on patient status, additional functional criteria and insurance authorization.   Follow Up Recommendations  SNF;Supervision/Assistance - 24 hour    Equipment Recommendations  None recommended by OT    Recommendations for Other Services       Precautions / Restrictions Precautions Precautions: Fall;Other (comment) Precaution Comments: L spastic hemiplegia (chronic) Restrictions Weight Bearing Restrictions: No      Mobility Bed Mobility Overal bed mobility: Needs Assistance Bed Mobility: Supine to Sit     Supine to sit: Min assist     General bed mobility comments: assist to bring truck fully upright and to EOB.    Transfers Overall transfer level: Needs assistance Equipment used: 1 person hand held assist Transfers: Sit to/from [employer] Sit to Stand: Mod assist Stand pivot transfers: Mod assist       General  transfer comment: mod assist to power up into standing.    Balance Overall balance assessment: Needs assistance Sitting-balance support: Feet supported Sitting balance-Leahy Scale: Poor Sitting balance - Comments: reliant on single UE support   Standing balance support: Bilateral upper extremity supported;During functional activity Standing balance-Leahy Scale: Poor Standing balance comment: Pt reliant on outside support.                           ADL either performed or assessed with clinical judgement   ADL Overall ADL's : Needs assistance/impaired Eating/Feeding: Set up;Sitting Eating/Feeding Details (indicate cue type and reason): assist to open packages and cut food. Grooming: Wash/dry hands;Wash/dry face;Oral care;Set up;Sitting   Upper Body Bathing: Minimal assistance;Sitting;Cueing for compensatory techniques   Lower Body Bathing: Moderate assistance;Sit to/from stand;Cueing for compensatory techniques Lower Body Bathing Details (indicate cue type and reason): Pt with difficulty maintaining standing to bathe due to decreased balance and increased tone in LLE. Upper Body Dressing : Minimal assistance;Sitting;Cueing for compensatory techniques   Lower Body Dressing: Moderate assistance;Sit to/from stand;Cueing for compensatory techniques Lower Body Dressing Details (indicate cue type and reason): Pt donned socks with figure 4 technique at EOB with min assist for sitting balance. Toilet Transfer: Moderate assistance;Stand-pivot;BSC   Toileting- Clothing Manipulation and Hygiene: Maximal assistance;Sit to/from stand;Cueing for compensatory techniques Toileting - Clothing Manipulation Details (indicate cue type and reason): Pt unable to let go of walker to manipulate clothing in standing.     Functional mobility during ADLs: Moderate assistance General ADL Comments: PT limited by pain in L shoulder and spasticity in L side. Pt has  had botox injections in past that  were helpful to this problem.  Pt is alone some during the day but not often.  Pt was significantly more independent with adls and mobility before fall on 9/3     Vision Baseline Vision/History: 3 Glaucoma Ability to See in Adequate Light: 0 Adequate Patient Visual Report: No change from baseline Vision Assessment?: No apparent visual deficits Additional Comments: Pt takes drops for glaucoma.  Pt could find all items in room, read clock and phone independently.     Perception Perception Perception Tested?: No   Praxis Praxis Praxis tested?: Within functional limits    Pertinent Vitals/Pain Pain Assessment: Faces Faces Pain Scale: Hurts a little bit Pain Location: L posterior scapula in setting of fall 9/4 Pain Descriptors / Indicators: Grimacing;Guarding Pain Intervention(s): Monitored during session;Repositioned;Utilized relaxation techniques     Hand Dominance Right   Extremity/Trunk Assessment Upper Extremity Assessment Upper Extremity Assessment: LUE deficits/detail LUE Deficits / Details: PROM and AROM WFL. Pt with significant tone in the LUE. Scapular mobs performed with release of tightness in LUE.  Pt able to use LUE to assist with most tasks. LUE Sensation: decreased light touch;decreased proprioception LUE Coordination: decreased fine motor;decreased gross motor   Lower Extremity Assessment Lower Extremity Assessment: Defer to PT evaluation   Cervical / Trunk Assessment Cervical / Trunk Assessment: Normal   Communication Communication Communication: No difficulties   Cognition Arousal/Alertness: Awake/alert Behavior During Therapy: WFL for tasks assessed/performed Overall Cognitive Status: Within Functional Limits for tasks assessed                                 General Comments: Pt states she has some mild memory deficits.   General Comments  Pt with long standing spasticity in L side.    Exercises Exercises: Other exercises Other  Exercises Other Exercises: L scapular mobilizations followed by ROM to  L shoulder to increase ROM and functional use of L arm.   Shoulder Instructions      Home Living Family/patient expects to be discharged to:: Private residence Living Arrangements: Children;Other relatives Available Help at Discharge: Family;Available 24 hours/day Type of Home: House Home Access: Level entry     Home Layout: One level     Bathroom Shower/Tub: Chief Strategy Officer: Standard     Home Equipment: Environmental consultant - 2 wheels;Shower seat;Bedside commode;Wheelchair - manual;Other (comment)   Additional Comments: Pt primarily sponge bathes in bedroom.  Bedroom is upstairs with 11 steps.  Pt stays upstairs all day, for all meals etc and only comes down when they go out to [place] or take an outing.      Prior Functioning/Environment Level of Independence: Needs assistance  Gait / Transfers Assistance Needed: pt modI with walker until 9/3 when she had a fall. Since then, performing stand pivot transfers mostly to w/c or BSC ADL's / Homemaking Assistance Needed: assist into shower but has not showered since 9/3. Sponge bathes only.  Pt can dress self, sponge bathe and was pivot transferring to toilet without assist prior to admit.            OT Problem List: Decreased strength;Impaired balance (sitting and/or standing);Decreased coordination;Impaired sensation;Impaired tone;Impaired UE functional use;Pain      OT Treatment/Interventions: Therapeutic exercise;Self-care/ADL training;Neuromuscular education;Therapeutic activities;Balance training    OT Goals(Current goals can be found in the care plan section) Acute Rehab OT Goals Patient Stated Goal: to walk OT Goal  Formulation: With patient Time For Goal Achievement: 03/22/21 Potential to Achieve Goals: Good ADL Goals Pt Will Perform Lower Body Bathing: with supervision;sit to/from stand Pt Will Perform Lower Body Dressing: with  supervision;sit to/from stand Pt Will Transfer to Toilet: with min guard assist;stand pivot transfer;bedside commode Pt Will Perform Toileting - Clothing Manipulation and hygiene: with min guard assist;sit to/from stand Additional ADL Goal #1: Pt/family member will be independent with LUE exercise program to promote stretching and ROM to increase functional use of LUE.  OT Frequency: Min 2X/week   Barriers to D/C:    has 24 hour assist.       Co-evaluation              AM-PAC OT "6 Clicks" Daily Activity     Outcome Measure Help from another person eating meals?: A Little Help from another person taking care of personal grooming?: A Little Help from another person toileting, which includes using toliet, bedpan, or urinal?: A Lot Help from another person bathing (including washing, rinsing, drying)?: A Little Help from another person to put on and taking off regular upper body clothing?: A Little Help from another person to put on and taking off regular lower body clothing?: A Lot 6 Click Score: 16   End of Session Nurse Communication: Mobility status  Activity Tolerance: Patient tolerated treatment well Patient left: in chair;with call bell/phone within reach;with chair alarm set  OT Visit Diagnosis: Unsteadiness on feet (R26.81);Hemiplegia and hemiparesis Hemiplegia - Right/Left: Left Hemiplegia - dominant/non-dominant: Non-Dominant Hemiplegia - caused by:  (old CVA)                Time: 0734-0808 OT Time Calculation (min): 34 min Charges:  OT General Charges $OT Visit: 1 Visit OT Evaluation $OT Eval Moderate Complexity: 1 Mod OT Treatments $Self Care/Home Management : 8-22 mins  Hope Budds 03/08/2021, 8:39 AM

## 2021-03-09 MED ORDER — POLYETHYLENE GLYCOL 3350 17 G PO PACK
17.0000 g | PACK | Freq: Every day | ORAL | Status: DC
  Administered 2021-03-09 – 2021-03-10 (×2): 17 g via ORAL
  Filled 2021-03-09 (×4): qty 1

## 2021-03-09 MED ORDER — SODIUM CHLORIDE 0.9 % IV SOLN: INTRAVENOUS | Status: DC

## 2021-03-09 NOTE — Progress Notes (Signed)
PROGRESS NOTE    Shirley Hansen  ZOX:[id number] DOB: Nov 29, 1965 DOA: 03/05/2021 PCP: Oneita Hurt, No   Brief Narrative:  Shirley Hansen is an 55 y.o. female with medical history significant for CVA with left-sided spastic hemiparesis followed by neurology at [facility] health, muscle spasm of left lower extremity, nontraumatic subcortical hemorrhage of right cerebral hemisphere, muscle spasticity, history of hypokalemia, coronary artery disease with history of MI in 2017, essential hypertension, hyperlipidemia, chronic anxiety/depression, who presented to [facility] ED due to worsening left-sided weakness and paresthesia.   Await TOC evaluation.    Assessment & Plan:   Active Problems:   Weakness  Left-sided weakness in the setting of prior CVA with left spastic hemiparesis with dizziness Work-up unremarkable, MRI brain negative for acute CVA, UA negative.  Lab studies unremarkable.  She is slightly orthostatic, will start on normal saline at 75 cc/h.  PT OT has assessed her and recommended SNF.  TOC working on that.  CIR was also consulted at patient's request but she was deemed not an appropriate candidate for them.  Still complains of some dizziness, will continue meclizine.  Pneumonia ruled out based off of negative procalcitonin and no other clinical signs of pneumonia.   Hypertension: Blood pressure on the low side and she is orthostatic positive as well so we will discontinue lisinopril and amlodipine but will continue Coreg for now.   Coronary artery disease status post MI in 2017/hyperlipidemia: Continue aspirin and Lipitor.  Chronic anxiety/depression: On Cymbalta, Lexapro, Lamictal.  Intractable headache:  Headache cocktail given, 1 dose of IV Toradol 15 mg x 1, 1 dose of IV Reglan 5 mg x 1, and 1 dose of IV Benadryl 12.5 mg x 1. -appears resolved   obesity Body mass index is 33.99 kg/m.  DVT prophylaxis: SCDs Start: 03/05/21 2041   Code Status: Full Code  Family Communication:  None present at  bedside.  Plan of care discussed with patient in length and he verbalized understanding and agreed with it.  Status is: Inpatient  Remains inpatient appropriate because:Inpatient level of care appropriate due to severity of illness  Dispo:  Patient From: Home  Planned Disposition: Skilled Nursing Facility  Medically stable for discharge: No          Estimated body mass index is 33.99 kg/m as calculated from the following:   Height as of this encounter: 5\' 4"  (1.626 m).   Weight as of this encounter: 89.8 kg.     Nutritional Assessment: Body mass index is 33.99 kg/m. Seen by dietician.  I agree with the assessment and plan as outlined below: Nutrition Status:  Skin Assessment: I have examined the patient's skin and I agree with the wound assessment as performed by the wound care RN as outlined below:    Consultants:  None  Procedures:  None  Antimicrobials:  Anti-infectives (From admission, onward)    None          Subjective: Patient seen and examined.  She complains of dizziness and continued left-sided weakness.  Objective: Vitals:   03/08/21 2133 03/08/21 2133 03/09/21 0411 03/09/21 0832  BP: 98/71 98/71 94/74  105/76  Pulse:  93 86   Resp:   18 18  Temp:   98 F (36.7 C) 98.7 F (37.1 C)  TempSrc:   Oral Oral  SpO2:   98% 97%  Weight:      Height:        Intake/Output Summary (Last 24 hours) at 03/09/2021 1352 Last data filed at 03/08/2021 1945 Gross  per 24 hour  Intake 120 ml  Output --  Net 120 ml   Filed Weights   03/05/21 1431  Weight: 89.8 kg    Examination:  General exam: Appears calm and comfortable  Respiratory system: Clear to auscultation. Respiratory effort normal. Cardiovascular system: S1 & S2 heard, RRR. No JVD, murmurs, rubs, gallops or clicks. No pedal edema. Gastrointestinal system: Abdomen is nondistended, soft and nontender. No organomegaly or masses felt. Normal bowel sounds heard. Central nervous system:  Alert and oriented.  Slight weakness in the left upper and lower extremity compared to the right. Skin: No rashes, lesions or ulcers Psychiatry: Judgement and insight appear normal. Mood & affect appropriate.    Data Reviewed: I have personally reviewed following labs and imaging studies  CBC: Recent Labs  Lab 03/05/21 1332  WBC 6.7  NEUTROABS 4.1  HGB 13.5  HCT 41.3  MCV 89.6  PLT 435*   Basic Metabolic Panel: Recent Labs  Lab 03/05/21 1332 03/05/21 1631 03/06/21 0500  NA 139  --  140  K 3.4*  --  3.6  CL 102  --  103  CO2 28  --  28  GLUCOSE 125*  --  117*  BUN 10  --  8  CREATININE 1.03*  --  1.05*  CALCIUM 9.4  --  9.3  MG  --  2.1  --   PHOS  --  3.2  --    GFR: Estimated Creatinine Clearance: 65.7 mL/min (A) (by C-G formula based on SCr of 1.05 mg/dL (H)). Liver Function Tests: Recent Labs  Lab 03/05/21 1332  AST 20  ALT 22  ALKPHOS 67  BILITOT 0.5  PROT 7.3  ALBUMIN 3.4*   No results for input(s): LIPASE, AMYLASE in the last 168 hours. No results for input(s): AMMONIA in the last 168 hours. Coagulation Profile: Recent Labs  Lab 03/05/21 1332  INR 0.9   Cardiac Enzymes: No results for input(s): CKTOTAL, CKMB, CKMBINDEX, TROPONINI in the last 168 hours. BNP (last 3 results) No results for input(s): PROBNP in the last 8760 hours. HbA1C: No results for input(s): HGBA1C in the last 72 hours. CBG: Recent Labs  Lab 03/05/21 1319 03/05/21 1627  GLUCAP 125* 134*   Lipid Profile: No results for input(s): CHOL, HDL, LDLCALC, TRIG, CHOLHDL, LDLDIRECT in the last 72 hours. Thyroid Function Tests: No results for input(s): TSH, T4TOTAL, FREET4, T3FREE, THYROIDAB in the last 72 hours. Anemia Panel: No results for input(s): VITAMINB12, FOLATE, FERRITIN, TIBC, IRON, RETICCTPCT in the last 72 hours. Sepsis Labs: Recent Labs  Lab 03/06/21 0527  PROCALCITON <0.10    Recent Results (from the past 240 hour(s))  SARS CORONAVIRUS 2 (TAT 6-24 HRS)  Nasopharyngeal Nasopharyngeal Swab     Status: None   Collection Time: 03/05/21  9:30 PM   Specimen: Nasopharyngeal Swab  Result Value Ref Range Status   SARS Coronavirus 2 NEGATIVE NEGATIVE Final    Comment: (NOTE) SARS-CoV-2 target nucleic acids are NOT DETECTED.  The SARS-CoV-2 RNA is generally detectable in upper and lower respiratory specimens during the acute phase of infection. Negative results do not preclude SARS-CoV-2 infection, do not rule out co-infections with other pathogens, and should not be used as the sole basis for treatment or other patient management decisions. Negative results must be combined with clinical observations, patient history, and epidemiological information. The expected result is Negative.  Fact Sheet for Patients: HairSlick.no  Fact Sheet for Healthcare Providers: quierodirigir.com  This test is not yet approved or  cleared by the [place] and  has been authorized for detection and/or diagnosis of SARS-CoV-2 by FDA under an Emergency Use Authorization (EUA). This EUA will remain  in effect (meaning this test can be used) for the duration of the COVID-19 declaration under Se ction 564(b)(1) of the Act, 21 U.S.C. section 360bbb-3(b)(1), unless the authorization is terminated or revoked sooner.  Performed at [facility], [address], [place], [place] [address]       Radiology Studies: No results found.  Scheduled Meds:  amitriptyline  25 mg Oral Daily   amLODipine  5 mg Oral Daily   aspirin EC  81 mg Oral Daily   atorvastatin  80 mg Oral QHS   baclofen  5 mg Oral QID   carvedilol  25 mg Oral BID   dorzolamide-timolol  1 drop Both Eyes BID   DULoxetine  60 mg Oral Daily   escitalopram  5 mg Oral Daily   lamoTRIgine  25 mg Oral Daily   latanoprost  1 drop Both Eyes QHS   lisinopril  10 mg Oral Daily   Continuous Infusions:   LOS: 1 day   Time spent: 32  minutes   Hughie Closs, MD [facility]  03/09/2021, 1:52 PM  Please page via Amion and do not message via secure chat for anything urgent. Secure chat can be used for anything non urgent.  How to contact the [facility] Attending or Consulting provider 7A - 7P or covering provider during after hours 7P -7A, for this patient?  Check the care team in [facility] and look for a) attending/consulting TRH provider listed and b) the [facility] team listed. Page or secure chat 7A-7P. Log into [email or web address] and use [place]'s universal password to access. If you do not have the password, please contact the hospital operator. Locate the [facility] provider you are looking for under [facility] and page to a number that you can be directly reached. If you still have difficulty reaching the provider, please page the [facility] (Director on Call) for the Hospitalists listed on amion for assistance.

## 2021-03-09 NOTE — TOC Progression Note (Signed)
Transition of Care [facility]) - Progression Note    Patient Details  Name: Pollie Poma MRN: [id number] Date of Birth: Nov 08, 1965  Transition of Care [facility]) CM/SW Contact  Lorri Frederick, LCSW Phone Number: 03/09/2021, 2:12 PM  Clinical Narrative:   No SNF bed offers.  CSW widened search to include [place] and [facility].       Expected Discharge Plan: Skilled Nursing Facility Barriers to Discharge: SNF Pending bed offer, Continued Medical Work up  Expected Discharge Plan and Services Expected Discharge Plan: Skilled Nursing Facility In-house Referral: Clinical Social Work   Post Acute Care Choice: Skilled Nursing Facility Living arrangements for the past 2 months: Single Family Home                                       Social Determinants of Health (SDOH) Interventions    Readmission Risk Interventions No flowsheet data found.

## 2021-03-09 NOTE — Plan of Care (Signed)

## 2021-03-10 NOTE — TOC Progression Note (Signed)
Transition of Care [facility]) - Progression Note    Patient Details  Name: Shirley Hansen MRN: [id number] Date of Birth: 08-10-1965  Transition of Care [facility]) CM/SW Contact  Lorri Frederick, LCSW Phone Number: 03/10/2021, 2:12 PM  Clinical Narrative: CSW spoke with pt regarding no current bed offers.  Discussed whether [place] options or [place] options would be acceptable.  Pt much prefers [place] area.  CSW spoke with following facilities regarding this referral: [facility]: no medicaid beds [facility]: they have to review pt medicaid and will call back [facility]: no response       Expected Discharge Plan: Skilled Nursing Facility Barriers to Discharge: SNF Pending bed offer, Continued Medical Work up  Expected Discharge Plan and Services Expected Discharge Plan: Skilled Nursing Facility In-house Referral: Clinical Social Work   Post Acute Care Choice: Skilled Nursing Facility Living arrangements for the past 2 months: Single Family Home                                       Social Determinants of Health (SDOH) Interventions    Readmission Risk Interventions No flowsheet data found.

## 2021-03-10 NOTE — Progress Notes (Signed)
PROGRESS NOTE    Shirley Hansen  NGE:[id number] DOB: [birth year] DOA: 03/05/2021 PCP: Oneita Hurt, No   Brief Narrative:  Shirley Hansen is an 55 y.o. female with medical history significant for CVA with left-sided spastic hemiparesis followed by neurology at [facility] health, muscle spasm of left lower extremity, nontraumatic subcortical hemorrhage of right cerebral hemisphere, muscle spasticity, history of hypokalemia, coronary artery disease with history of MI in 2017, essential hypertension, hyperlipidemia, chronic anxiety/depression, who presented to [facility] ED due to worsening left-sided weakness and paresthesia.   Await TOC evaluation.    Assessment & Plan:   Active Problems:   Weakness  Left-sided weakness in the setting of prior CVA with left spastic hemiparesis with dizziness Work-up unremarkable, MRI brain negative for acute CVA, UA negative.  Lab studies unremarkable.  PT OT has assessed her and recommended SNF.  TOC working on that.  CIR was also consulted at patient's request but she was deemed not an appropriate candidate for them.  Due to dizziness and positive orthostatics, started on fluids yesterday.  She denies any dizziness today.  Orthostatics pending.  We will continue fluids through tomorrow.  Pneumonia ruled out based off of negative procalcitonin and no other clinical signs of pneumonia.   Hypertension: Due to orthostatic positive, her lisinopril and amlodipine discontinued on 03/09/2021 but will continue Coreg for now and await further orthostatic vitals.   Coronary artery disease status post MI in 2017/hyperlipidemia: Continue aspirin and Lipitor.  Chronic anxiety/depression: On Cymbalta, Lexapro, Lamictal.  Intractable headache:  Headache cocktail given, 1 dose of IV Toradol 15 mg x 1, 1 dose of IV Reglan 5 mg x 1, and 1 dose of IV Benadryl 12.5 mg x 1. -appears resolved   obesity Body mass index is 33.99 kg/m.  DVT prophylaxis: SCDs Start: 03/05/21 2041   Code Status: Full Code   Family Communication:  None present at bedside.  Plan of care discussed with patient in length and he verbalized understanding and agreed with it.  Status is: Inpatient  Remains inpatient appropriate because:Inpatient level of care appropriate due to severity of illness  Dispo:  Patient From: Home  Planned Disposition: Skilled Nursing Facility  Medically stable for discharge: No          Estimated body mass index is 33.99 kg/m as calculated from the following:   Height as of this encounter: 5\' 4"  (1.626 m).   Weight as of this encounter: 89.8 kg.     Nutritional Assessment: Body mass index is 33.99 kg/m. Seen by dietician.  I agree with the assessment and plan as outlined below: Nutrition Status:  Skin Assessment: I have examined the patient's skin and I agree with the wound assessment as performed by the wound care RN as outlined below:    Consultants:  None  Procedures:  None  Antimicrobials:  Anti-infectives (From admission, onward)    None          Subjective: Patient seen and examined.  She has no new complaint.  Continues to have left-sided weakness.  Objective: Vitals:   03/09/21 0411 03/09/21 0832 03/09/21 2050 03/10/21 0438  BP: 94/74 105/76 127/88 101/65  Pulse: 86  91 79  Resp: 18 18 17 18   Temp: 98 F (36.7 C) 98.7 F (37.1 C) 98.7 F (37.1 C) 98 F (36.7 C)  TempSrc: Oral Oral Oral Oral  SpO2: 98% 97% 98% 95%  Weight:      Height:        Intake/Output Summary (Last 24 hours)  at 03/10/2021 1016 Last data filed at 03/09/2021 1632 Gross per 24 hour  Intake --  Output 450 ml  Net -450 ml    Filed Weights   03/05/21 1431  Weight: 89.8 kg    Examination:  General exam: Appears calm and comfortable  Respiratory system: Clear to auscultation. Respiratory effort normal. Cardiovascular system: S1 & S2 heard, RRR. No JVD, murmurs, rubs, gallops or clicks. No pedal edema. Gastrointestinal system: Abdomen is nondistended, soft  and nontender. No organomegaly or masses felt. Normal bowel sounds heard. Central nervous system: Alert and oriented.  4/5 power in left upper and lower extremity Extremities: Symmetric 5 x 5 power. Skin: No rashes, lesions or ulcers.  Psychiatry: Judgement and insight appear normal. Mood & affect appropriate.    Data Reviewed: I have personally reviewed following labs and imaging studies  CBC: Recent Labs  Lab 03/05/21 1332  WBC 6.7  NEUTROABS 4.1  HGB 13.5  HCT 41.3  MCV 89.6  PLT 435*    Basic Metabolic Panel: Recent Labs  Lab 03/05/21 1332 03/05/21 1631 03/06/21 0500  NA 139  --  140  K 3.4*  --  3.6  CL 102  --  103  CO2 28  --  28  GLUCOSE 125*  --  117*  BUN 10  --  8  CREATININE 1.03*  --  1.05*  CALCIUM 9.4  --  9.3  MG  --  2.1  --   PHOS  --  3.2  --     GFR: Estimated Creatinine Clearance: 65.7 mL/min (A) (by C-G formula based on SCr of 1.05 mg/dL (H)). Liver Function Tests: Recent Labs  Lab 03/05/21 1332  AST 20  ALT 22  ALKPHOS 67  BILITOT 0.5  PROT 7.3  ALBUMIN 3.4*    No results for input(s): LIPASE, AMYLASE in the last 168 hours. No results for input(s): AMMONIA in the last 168 hours. Coagulation Profile: Recent Labs  Lab 03/05/21 1332  INR 0.9    Cardiac Enzymes: No results for input(s): CKTOTAL, CKMB, CKMBINDEX, TROPONINI in the last 168 hours. BNP (last 3 results) No results for input(s): PROBNP in the last 8760 hours. HbA1C: No results for input(s): HGBA1C in the last 72 hours. CBG: Recent Labs  Lab 03/05/21 1319 03/05/21 1627  GLUCAP 125* 134*    Lipid Profile: No results for input(s): CHOL, HDL, LDLCALC, TRIG, CHOLHDL, LDLDIRECT in the last 72 hours. Thyroid Function Tests: No results for input(s): TSH, T4TOTAL, FREET4, T3FREE, THYROIDAB in the last 72 hours. Anemia Panel: No results for input(s): VITAMINB12, FOLATE, FERRITIN, TIBC, IRON, RETICCTPCT in the last 72 hours. Sepsis Labs: Recent Labs  Lab  03/06/21 0527  PROCALCITON <0.10     Recent Results (from the past 240 hour(s))  SARS CORONAVIRUS 2 (TAT 6-24 HRS) Nasopharyngeal Nasopharyngeal Swab     Status: None   Collection Time: 03/05/21  9:30 PM   Specimen: Nasopharyngeal Swab  Result Value Ref Range Status   SARS Coronavirus 2 NEGATIVE NEGATIVE Final    Comment: (NOTE) SARS-CoV-2 target nucleic acids are NOT DETECTED.  The SARS-CoV-2 RNA is generally detectable in upper and lower respiratory specimens during the acute phase of infection. Negative results do not preclude SARS-CoV-2 infection, do not rule out co-infections with other pathogens, and should not be used as the sole basis for treatment or other patient management decisions. Negative results must be combined with clinical observations, patient history, and epidemiological information. The expected result is Negative.  Fact  Sheet for Patients: HairSlick.no  Fact Sheet for Healthcare Providers: quierodirigir.com  This test is not yet approved or cleared by the [place] FDA and  has been authorized for detection and/or diagnosis of SARS-CoV-2 by FDA under an Emergency Use Authorization (EUA). This EUA will remain  in effect (meaning this test can be used) for the duration of the COVID-19 declaration under Se ction 564(b)(1) of the Act, 21 U.S.C. section 360bbb-3(b)(1), unless the authorization is terminated or revoked sooner.  Performed at [facility], [address] [place], [place] [address]        Radiology Studies: No results found.  Scheduled Meds:  amitriptyline  25 mg Oral Daily   aspirin EC  81 mg Oral Daily   atorvastatin  80 mg Oral QHS   baclofen  5 mg Oral QID   carvedilol  25 mg Oral BID   dorzolamide-timolol  1 drop Both Eyes BID   DULoxetine  60 mg Oral Daily   escitalopram  5 mg Oral Daily   lamoTRIgine  25 mg Oral Daily   latanoprost  1 drop Both Eyes QHS    polyethylene glycol  17 g Oral Daily   Continuous Infusions:  sodium chloride 75 mL/hr at 03/10/21 0257     LOS: 2 days   Time spent: 30 minutes   Hughie Closs, MD [facility]  03/10/2021, 10:16 AM  Please page via [facility] and do not message via secure chat for anything urgent. Secure chat can be used for anything non urgent.  How to contact the [facility] Attending or Consulting provider 7A - 7P or covering provider during after hours 7P -7A, for this patient?  Check the care team in [facility] and look for a) attending/consulting TRH provider listed and b) the [facility] team listed. Page or secure chat 7A-7P. Log into [email or web address] and use [place]'s universal password to access. If you do not have the password, please contact the hospital operator. Locate the [facility] provider you are looking for under [facility] and page to a number that you can be directly reached. If you still have difficulty reaching the provider, please page the [facility] (Director on Call) for the Hospitalists listed on amion for assistance.

## 2021-03-10 NOTE — Progress Notes (Signed)
Orthostatic vital signs, completed as of 9:30 am

## 2021-03-11 LAB — BASIC METABOLIC PANEL
Anion gap: 8 (ref 5–15)
BUN: 12 mg/dL (ref 6–20)
CO2: 24 mmol/L (ref 22–32)
Calcium: 8.1 mg/dL — ABNORMAL LOW (ref 8.9–10.3)
Chloride: 109 mmol/L (ref 98–111)
Creatinine, Ser: 0.79 mg/dL (ref 0.44–1.00)
GFR, Estimated: >60 mL/min (ref >60)
Glucose, Bld: 87 mg/dL (ref 70–99)
Potassium: 3.5 mmol/L (ref 3.5–5.1)
Sodium: 141 mmol/L (ref 135–145)

## 2021-03-11 MED ORDER — CARVEDILOL 12.5 MG PO TABS
12.5000 mg | ORAL_TABLET | Freq: Two times a day (BID) | ORAL | Status: DC
  Administered 2021-03-11 – 2021-03-13 (×4): 12.5 mg via ORAL
  Filled 2021-03-11 (×4): qty 1

## 2021-03-11 NOTE — Progress Notes (Signed)
PROGRESS NOTE    Shirley Hansen  ENI:[id number] DOB: 04-30-1966 DOA: 03/05/2021 PCP: Oneita Hurt, No   Brief Narrative:  Shirley Hansen is an 55 y.o. female with medical history significant for CVA with left-sided spastic hemiparesis followed by neurology at [facility] health, muscle spasm of left lower extremity, nontraumatic subcortical hemorrhage of right cerebral hemisphere, muscle spasticity, history of hypokalemia, coronary artery disease with history of MI in 2017, essential hypertension, hyperlipidemia, chronic anxiety/depression, who presented to [facility] ED due to worsening left-sided weakness and paresthesia.   Await TOC evaluation.    Assessment & Plan:   Active Problems:   Weakness  Left-sided weakness in the setting of prior CVA with left spastic hemiparesis with dizziness Work-up unremarkable, MRI brain negative for acute CVA, UA negative.  Lab studies unremarkable.  PT OT has assessed her and recommended SNF.  TOC working on that.  CIR was also consulted at patient's request but she was deemed not an appropriate candidate for them.  Due to dizziness and positive orthostatics, started on fluids, orthostatics improved yesterday but slightly worse today and she still feels dizzy.  We will discontinue IV fluids and watch for now.  Also will reduce Coreg to 12.5 mg twice daily.  Continue to check orthostatics every shift.  Pneumonia ruled out based off of negative procalcitonin and no other clinical signs of pneumonia.   Hypertension: Due to orthostatic positive, her lisinopril and amlodipine discontinued on 03/09/2021, still slightly orthostatic but better, will reduce Coreg dose to 12.5 mg p.o. twice daily.   Coronary artery disease status post MI in 2017/hyperlipidemia: Continue aspirin and Lipitor.  Chronic anxiety/depression: On Cymbalta, Lexapro, Lamictal.  Intractable headache:  Headache cocktail given, 1 dose of IV Toradol 15 mg x 1, 1 dose of IV Reglan 5 mg x 1, and 1 dose of IV Benadryl 12.5 mg x  1. -appears resolved   obesity Body mass index is 33.99 kg/m.  DVT prophylaxis: SCDs Start: 03/05/21 2041   Code Status: Full Code  Family Communication:  None present at bedside.  Plan of care discussed with patient in length and he verbalized understanding and agreed with it.  Status is: Inpatient  Remains inpatient appropriate because:Inpatient level of care appropriate due to severity of illness  Dispo:  Patient From: Home  Planned Disposition: Home with home health.  Patient has been difficult to place at [facility] for various reasons.  Patient has progressed and has help at home.  Medically stable for discharge: No     Estimated body mass index is 33.99 kg/m as calculated from the following:   Height as of this encounter: 5\' 4"  (1.626 m).   Weight as of this encounter: 89.8 kg.     Nutritional Assessment: Body mass index is 33.99 kg/m. Seen by dietician.  I agree with the assessment and plan as outlined below: Nutrition Status:  Skin Assessment: I have examined the patient's skin and I agree with the wound assessment as performed by the wound care RN as outlined below:    Consultants:  None  Procedures:  None  Antimicrobials:  Anti-infectives (From admission, onward)    None          Subjective: Patient seen and examined.  She still complains of dizziness and weakness in the left side.  No new complaint.  Objective: Vitals:   03/09/21 2050 03/10/21 0438 03/10/21 2034 03/11/21 0530  BP: 127/88 101/65 120/77 117/82  Pulse: 91 79 89 81  Resp: 17 18 16 16   Temp: 98.7  F (37.1 C) 98 F (36.7 C) 99.2 F (37.3 C) (!) 97.5 F (36.4 C)  TempSrc: Oral Oral Oral Axillary  SpO2: 98% 95% 98% 100%  Weight:      Height:        Intake/Output Summary (Last 24 hours) at 03/11/2021 1123 Last data filed at 03/11/2021 1000 Gross per 24 hour  Intake 2151.21 ml  Output 900 ml  Net 1251.21 ml    Filed Weights   03/05/21 1431  Weight: 89.8 kg     Examination: General exam: Appears calm and comfortable  Respiratory system: Clear to auscultation. Respiratory effort normal. Cardiovascular system: S1 & S2 heard, RRR. No JVD, murmurs, rubs, gallops or clicks. No pedal edema. Gastrointestinal system: Abdomen is nondistended, soft and nontender. No organomegaly or masses felt. Normal bowel sounds heard. Central nervous system: Alert and oriented.  4/5 power in left upper and lower extremity, chronic. Extremities: Symmetric 5 x 5 power. Skin: No rashes, lesions or ulcers.  Psychiatry: Judgement and insight appear normal. Mood & affect appropriate.   Data Reviewed: I have personally reviewed following labs and imaging studies  CBC: Recent Labs  Lab 03/05/21 1332  WBC 6.7  NEUTROABS 4.1  HGB 13.5  HCT 41.3  MCV 89.6  PLT 435*    Basic Metabolic Panel: Recent Labs  Lab 03/05/21 1332 03/05/21 1631 03/06/21 0500 03/11/21 0143  NA 139  --  140 141  K 3.4*  --  3.6 3.5  CL 102  --  103 109  CO2 28  --  28 24  GLUCOSE 125*  --  117* 87  BUN 10  --  8 12  CREATININE 1.03*  --  1.05* 0.79  CALCIUM 9.4  --  9.3 8.1*  MG  --  2.1  --   --   PHOS  --  3.2  --   --     GFR: Estimated Creatinine Clearance: 86.2 mL/min (by C-G formula based on SCr of 0.79 mg/dL). Liver Function Tests: Recent Labs  Lab 03/05/21 1332  AST 20  ALT 22  ALKPHOS 67  BILITOT 0.5  PROT 7.3  ALBUMIN 3.4*    No results for input(s): LIPASE, AMYLASE in the last 168 hours. No results for input(s): AMMONIA in the last 168 hours. Coagulation Profile: Recent Labs  Lab 03/05/21 1332  INR 0.9    Cardiac Enzymes: No results for input(s): CKTOTAL, CKMB, CKMBINDEX, TROPONINI in the last 168 hours. BNP (last 3 results) No results for input(s): PROBNP in the last 8760 hours. HbA1C: No results for input(s): HGBA1C in the last 72 hours. CBG: Recent Labs  Lab 03/05/21 1319 03/05/21 1627  GLUCAP 125* 134*    Lipid Profile: No results for  input(s): CHOL, HDL, LDLCALC, TRIG, CHOLHDL, LDLDIRECT in the last 72 hours. Thyroid Function Tests: No results for input(s): TSH, T4TOTAL, FREET4, T3FREE, THYROIDAB in the last 72 hours. Anemia Panel: No results for input(s): VITAMINB12, FOLATE, FERRITIN, TIBC, IRON, RETICCTPCT in the last 72 hours. Sepsis Labs: Recent Labs  Lab 03/06/21 0527  PROCALCITON <0.10     Recent Results (from the past 240 hour(s))  SARS CORONAVIRUS 2 (TAT 6-24 HRS) Nasopharyngeal Nasopharyngeal Swab     Status: None   Collection Time: 03/05/21  9:30 PM   Specimen: Nasopharyngeal Swab  Result Value Ref Range Status   SARS Coronavirus 2 NEGATIVE NEGATIVE Final    Comment: (NOTE) SARS-CoV-2 target nucleic acids are NOT DETECTED.  The SARS-CoV-2 RNA is generally detectable in  upper and lower respiratory specimens during the acute phase of infection. Negative results do not preclude SARS-CoV-2 infection, do not rule out co-infections with other pathogens, and should not be used as the sole basis for treatment or other patient management decisions. Negative results must be combined with clinical observations, patient history, and epidemiological information. The expected result is Negative.  Fact Sheet for Patients: HairSlick.no  Fact Sheet for Healthcare Providers: quierodirigir.com  This test is not yet approved or cleared by the [place] FDA and  has been authorized for detection and/or diagnosis of SARS-CoV-2 by FDA under an Emergency Use Authorization (EUA). This EUA will remain  in effect (meaning this test can be used) for the duration of the COVID-19 declaration under Se ction 564(b)(1) of the Act, 21 U.S.C. section 360bbb-3(b)(1), unless the authorization is terminated or revoked sooner.  Performed at [facility], [address] [place], [place] [address]        Radiology Studies: No results found.  Scheduled Meds:   amitriptyline  25 mg Oral Daily   aspirin EC  81 mg Oral Daily   atorvastatin  80 mg Oral QHS   baclofen  5 mg Oral QID   carvedilol  25 mg Oral BID   dorzolamide-timolol  1 drop Both Eyes BID   DULoxetine  60 mg Oral Daily   escitalopram  5 mg Oral Daily   lamoTRIgine  25 mg Oral Daily   latanoprost  1 drop Both Eyes QHS   polyethylene glycol  17 g Oral Daily   Continuous Infusions:  sodium chloride Stopped (03/11/21 0912)     LOS: 3 days   Time spent: 29 minutes   Hughie Closs, MD [facility]  03/11/2021, 11:23 AM  Please page via Loretha Stapler and do not message via secure chat for anything urgent. Secure chat can be used for anything non urgent.  How to contact the [facility] Attending or Consulting provider 7A - 7P or covering provider during after hours 7P -7A, for this patient?  Check the care team in [facility] and look for a) attending/consulting TRH provider listed and b) the [facility] team listed. Page or secure chat 7A-7P. Log into [email or web address] and use [place]'s universal password to access. If you do not have the password, please contact the hospital operator. Locate the [facility] provider you are looking for under [facility] and page to a number that you can be directly reached. If you still have difficulty reaching the provider, please page the [facility] (Director on Call) for the Hospitalists listed on amion for assistance.

## 2021-03-11 NOTE — TOC Progression Note (Addendum)
Transition of Care [facility]) - Progression Note    Patient Details  Name: Shirley Hansen MRN: [id number] Date of Birth: Sep 06, 1965  Transition of Care [facility]) CM/SW Contact  Lorri Frederick, LCSW Phone Number: 03/11/2021, 10:04 AM  Clinical Narrative:    [facility] declined pt. Still no response from [place].  CSW spoke with pt while PT was working with her in the room.  Pt is making progress to the point where [facility] is a viable option.  We discussed this and pt would be agreeable to this, niece and brother in law are in the home and able to provide support.  Pt also has medicaid, would like permanent HH aide if possible.    CSW spoke with pt niece Ernestine Mcmurray who confirmed that she is home doing online school during the day and able to offer assistance to pt.  She would agree with [facility] plan.    Amy at [facility] accepts for [facility].    1330: Request for evaluation for personal care services faxed to [facility].      Expected Discharge Plan: Skilled Nursing Facility Barriers to Discharge: SNF Pending bed offer, Continued Medical Work up  Expected Discharge Plan and Services Expected Discharge Plan: Skilled Nursing Facility In-house Referral: Clinical Social Work   Post Acute Care Choice: Skilled Nursing Facility Living arrangements for the past 2 months: Single Family Home                                       Social Determinants of Health (SDOH) Interventions    Readmission Risk Interventions No flowsheet data found.

## 2021-03-11 NOTE — Progress Notes (Signed)
Physical Therapy Treatment Patient Details Name: Harvest Deist MRN: [id number] DOB: 11-23-1965 Today's Date: 03/11/2021   History of Present Illness Pt is a 55 y.o. F who presents with left sided weakness, paresthesia. MRI negative for acute CVA, UA negative. ?PNA as x ray shows right basilar consolidation. Significant PMH: CVA with left sided spastic hemiparesis, nontraumatic subcortical hemorrhage of right cerebral hemisphere, CAD, HTN, chronic anxiety/depression.    PT Comments    Pt pleasant and able to progress gait to 42' which pt reports is sufficient for home distance. Pt with niece at home who can provide supervision limited by the fact she is in school online but pt reports this was the situation PTA as well. Pt able to perform all transfers with minguard-min assist and pt states family can provide needed support. Pt continues to be limited by LUE spasticity and pain with tightness in upper trap improved with heat and trigger point release/soft tissue massage. Pt encouraged to continue mobility acute and transition to chair. Pt agreeable to HHPT with aide given difficult to find SNF placement. Will continue to follow.     Recommendations for follow up therapy are one component of a multi-disciplinary discharge planning process, led by the attending physician.  Recommendations may be updated based on patient status, additional functional criteria and insurance authorization.  Follow Up Recommendations  Home health PT;Other (comment);Supervision for mobility/OOB (aide)     Equipment Recommendations  None recommended by PT    Recommendations for Other Services       Precautions / Restrictions Precautions Precautions: Fall;Other (comment) Precaution Comments: L spastic hemiplegia (chronic)     Mobility  Bed Mobility Overal bed mobility: Modified Independent             General bed mobility comments: with rail and increased time x 2 trials due to MD request for orthostatics,  HOB flat    Transfers Overall transfer level: Needs assistance     Sit to Stand: Min assist         General transfer comment: min assis to rise from bed and chair with cues for anterior translation, hand placement and controlled descent to surface  Ambulation/Gait Ambulation/Gait assistance: Min guard Gait Distance (Feet): 50 Feet Assistive device: Rolling walker (2 wheeled) Gait Pattern/deviations: Step-to pattern;Decreased stance time - left;Decreased stride length   Gait velocity interpretation: <1.8 ft/sec, indicate of risk for recurrent falls General Gait Details: pt able to grasp and control RW with bil UE with pt with spasticity of LLE but able to advance gait to 50'. 4 periods of LUE spasm releasing from RW with pt able to recover with cues only.   Stairs Stairs:  (pt declined attempting)           Wheelchair Mobility    Modified Rankin (Stroke Patients Only)       Balance Overall balance assessment: Needs assistance Sitting-balance support: Feet supported;No upper extremity supported Sitting balance-Leahy Scale: Fair Sitting balance - Comments: eOb without support   Standing balance support: Bilateral upper extremity supported;During functional activity Standing balance-Leahy Scale: Poor Standing balance comment: RW for standing and gait                            Cognition Arousal/Alertness: Awake/alert Behavior During Therapy: WFL for tasks assessed/performed Overall Cognitive Status: Within Functional Limits for tasks assessed  Exercises      General Comments        Pertinent Vitals/Pain Faces Pain Scale: Hurts even more Pain Location: left upper trap Pain Descriptors / Indicators: Grimacing;Guarding Pain Intervention(s): Limited activity within patient's tolerance;Repositioned;Monitored during session;Heat applied;Other (comment) (soft tissue release)    Home Living                       Prior Function            PT Goals (current goals can now be found in the care plan section) Progress towards PT goals: Progressing toward goals    Frequency    Min 3X/week      PT Plan Discharge plan needs to be updated    Co-evaluation              AM-PAC PT "6 Clicks" Mobility   Outcome Measure  Help needed turning from your back to your side while in a flat bed without using bedrails?: None Help needed moving from lying on your back to sitting on the side of a flat bed without using bedrails?: None Help needed moving to and from a bed to a chair (including a wheelchair)?: A Little Help needed standing up from a chair using your arms (e.g., wheelchair or bedside chair)?: A Little Help needed to walk in hospital room?: A Little Help needed climbing 3-5 steps with a railing? : A Lot 6 Click Score: 19    End of Session Equipment Utilized During Treatment: Gait belt Activity Tolerance: Patient tolerated treatment well Patient left: in chair;with call bell/phone within reach;with chair alarm set Nurse Communication: Mobility status PT Visit Diagnosis: Unsteadiness on feet (R26.81);Other abnormalities of gait and mobility (R26.89);History of falling (Z91.81);Difficulty in walking, not elsewhere classified (R26.2)     Time: 3545-6256 PT Time Calculation (min) (ACUTE ONLY): 41 min  Charges:  $Gait Training: 8-22 mins $Therapeutic Activity: 23-37 mins                     Keeven Matty P, PT Acute Rehabilitation Services Pager: [phone] Office: [phone]    Enedina Finner Seldon Barrell 03/11/2021, 10:48 AM

## 2021-03-12 NOTE — Progress Notes (Signed)
Pt c/o dizziness with movement

## 2021-03-12 NOTE — Progress Notes (Signed)
PROGRESS NOTE    Shirley Hansen  OZD:[id number] DOB: 1965-08-18 DOA: 03/05/2021 PCP: Oneita Hurt, No   Brief Narrative:  Shirley Hansen is an 55 y.o. female with medical history significant for CVA with left-sided spastic hemiparesis followed by neurology at [facility], muscle spasm of left lower extremity, nontraumatic subcortical hemorrhage of right cerebral hemisphere, muscle spasticity, history of hypokalemia, coronary artery disease with history of MI in 2017, essential hypertension, hyperlipidemia, chronic anxiety/depression, who presented to [facility] ED due to worsening left-sided weakness and paresthesia.   Await TOC evaluation.    Assessment & Plan:   Active Problems:   Weakness  Left-sided weakness in the setting of prior CVA with left spastic hemiparesis with dizziness Work-up unremarkable, MRI brain negative for acute CVA, UA negative.  Lab studies unremarkable.  PT OT has assessed her and recommended SNF.  TOC working on that.  CIR was also consulted at patient's request but she was deemed not an appropriate candidate for them.  Due to dizziness and positive orthostatics, started on fluids, orthostatics improved before being positive again.  She still complains of dizziness but no orthostatics done last night or this morning despite of clear orders for the orthostatics to be done every shift.  Patient's RN was especially advised to make sure to check orthostatics every shift.  We will follow that and act accordingly.  Pneumonia ruled out based off of negative procalcitonin and no other clinical signs of pneumonia.   Hypertension: Due to orthostatic positive, her lisinopril and amlodipine discontinued on 03/09/2021, and then her Coreg was also reduced to 12.5 mg p.o. twice daily on 03/11/2021.   Coronary artery disease status post MI in 2017/hyperlipidemia: Continue aspirin and Lipitor.  Chronic anxiety/depression: On Cymbalta, Lexapro, Lamictal.  Intractable headache:  Headache cocktail given, 1  dose of IV Toradol 15 mg x 1, 1 dose of IV Reglan 5 mg x 1, and 1 dose of IV Benadryl 12.5 mg x 1. -appears resolved   obesity Body mass index is 33.99 kg/m.  Generalized weakness/deconditioning/placement: Initially PT OT recommended SNF.  For various reasons, she was difficult to place.  In the meantime, patient progressed well and PT OT has upgraded the recommendations to discharge home with home health.  Discussed discharge with patient today however she tells me that her family is out of town and there is no one at home to take care of her and there is no one to pick her up today as well and she is requesting discharge tomorrow.  DVT prophylaxis: SCDs Start: 03/05/21 2041   Code Status: Full Code  Family Communication:  None present at bedside.  Plan of care discussed with patient in length and he verbalized understanding and agreed with it.  Status is: Inpatient  Remains inpatient appropriate because: Unsafe discharge/no family at home  Dispo:  Patient From: Home  Planned Disposition: Home with home health.    Medically stable for discharge: Yes     Estimated body mass index is 33.99 kg/m as calculated from the following:   Height as of this encounter: 5\' 4"  (1.626 m).   Weight as of this encounter: 89.8 kg.     Nutritional Assessment: Body mass index is 33.99 kg/m. Seen by dietician.  I agree with the assessment and plan as outlined below: Nutrition Status:  Skin Assessment: I have examined the patient's skin and I agree with the wound assessment as performed by the wound care RN as outlined below:    Consultants:  None  Procedures:  None  Antimicrobials:  Anti-infectives (From admission, onward)    None          Subjective: Seen and examined.  Still complains of dizziness.  No other complaint.  Objective: Vitals:   03/11/21 1231 03/11/21 1232 03/11/21 2101 03/12/21 0457  BP: (!) 123/91 122/90 106/60 113/76  Pulse: 93 93 89 76  Resp: (!) 22  19  16   Temp:   98.9 F (37.2 C) 97.9 F (36.6 C)  TempSrc:   Oral Oral  SpO2: 99% 99% 99% 97%  Weight:      Height:        Intake/Output Summary (Last 24 hours) at 03/12/2021 1041 Last data filed at 03/11/2021 1800 Gross per 24 hour  Intake 560 ml  Output --  Net 560 ml    Filed Weights   03/05/21 1431  Weight: 89.8 kg    Examination: General exam: Appears calm and comfortable  Respiratory system: Clear to auscultation. Respiratory effort normal. Cardiovascular system: S1 & S2 heard, RRR. No JVD, murmurs, rubs, gallops or clicks. No pedal edema. Gastrointestinal system: Abdomen is nondistended, soft and nontender. No organomegaly or masses felt. Normal bowel sounds heard. Central nervous system: Alert and oriented.  4/5 power in left upper and lower extremity. Extremities: Symmetric 5 x 5 power. Skin: No rashes, lesions or ulcers.  Psychiatry: Judgement and insight appear normal. Mood & affect appropriate.    Data Reviewed: I have personally reviewed following labs and imaging studies  CBC: Recent Labs  Lab 03/05/21 1332  WBC 6.7  NEUTROABS 4.1  HGB 13.5  HCT 41.3  MCV 89.6  PLT 435*    Basic Metabolic Panel: Recent Labs  Lab 03/05/21 1332 03/05/21 1631 03/06/21 0500 03/11/21 0143  NA 139  --  140 141  K 3.4*  --  3.6 3.5  CL 102  --  103 109  CO2 28  --  28 24  GLUCOSE 125*  --  117* 87  BUN 10  --  8 12  CREATININE 1.03*  --  1.05* 0.79  CALCIUM 9.4  --  9.3 8.1*  MG  --  2.1  --   --   PHOS  --  3.2  --   --     GFR: Estimated Creatinine Clearance: 86.2 mL/min (by C-G formula based on SCr of 0.79 mg/dL). Liver Function Tests: Recent Labs  Lab 03/05/21 1332  AST 20  ALT 22  ALKPHOS 67  BILITOT 0.5  PROT 7.3  ALBUMIN 3.4*    No results for input(s): LIPASE, AMYLASE in the last 168 hours. No results for input(s): AMMONIA in the last 168 hours. Coagulation Profile: Recent Labs  Lab 03/05/21 1332  INR 0.9    Cardiac Enzymes: No  results for input(s): CKTOTAL, CKMB, CKMBINDEX, TROPONINI in the last 168 hours. BNP (last 3 results) No results for input(s): PROBNP in the last 8760 hours. HbA1C: No results for input(s): HGBA1C in the last 72 hours. CBG: Recent Labs  Lab 03/05/21 1319 03/05/21 1627  GLUCAP 125* 134*    Lipid Profile: No results for input(s): CHOL, HDL, LDLCALC, TRIG, CHOLHDL, LDLDIRECT in the last 72 hours. Thyroid Function Tests: No results for input(s): TSH, T4TOTAL, FREET4, T3FREE, THYROIDAB in the last 72 hours. Anemia Panel: No results for input(s): VITAMINB12, FOLATE, FERRITIN, TIBC, IRON, RETICCTPCT in the last 72 hours. Sepsis Labs: Recent Labs  Lab 03/06/21 0527  PROCALCITON <0.10     Recent Results (from the past 240 hour(s))  SARS CORONAVIRUS 2 (TAT 6-24 HRS) Nasopharyngeal Nasopharyngeal Swab     Status: None   Collection Time: 03/05/21  9:30 PM   Specimen: Nasopharyngeal Swab  Result Value Ref Range Status   SARS Coronavirus 2 NEGATIVE NEGATIVE Final    Comment: (NOTE) SARS-CoV-2 target nucleic acids are NOT DETECTED.  The SARS-CoV-2 RNA is generally detectable in upper and lower respiratory specimens during the acute phase of infection. Negative results do not preclude SARS-CoV-2 infection, do not rule out co-infections with other pathogens, and should not be used as the sole basis for treatment or other patient management decisions. Negative results must be combined with clinical observations, patient history, and epidemiological information. The expected result is Negative.  Fact Sheet for Patients: HairSlick.no  Fact Sheet for Healthcare Providers: quierodirigir.com  This test is not yet approved or cleared by the [place] FDA and  has been authorized for detection and/or diagnosis of SARS-CoV-2 by FDA under an Emergency Use Authorization (EUA). This EUA will remain  in effect (meaning this test can be  used) for the duration of the COVID-19 declaration under Se ction 564(b)(1) of the Act, 21 U.S.C. section 360bbb-3(b)(1), unless the authorization is terminated or revoked sooner.  Performed at [facility], [address], [place], [place] [address]        Radiology Studies: No results found.  Scheduled Meds:  amitriptyline  25 mg Oral Daily   aspirin EC  81 mg Oral Daily   atorvastatin  80 mg Oral QHS   baclofen  5 mg Oral QID   carvedilol  12.5 mg Oral BID   dorzolamide-timolol  1 drop Both Eyes BID   DULoxetine  60 mg Oral Daily   escitalopram  5 mg Oral Daily   lamoTRIgine  25 mg Oral Daily   latanoprost  1 drop Both Eyes QHS   polyethylene glycol  17 g Oral Daily   Continuous Infusions:  sodium chloride Stopped (03/11/21 0912)     LOS: 4 days   Time spent: 28 minutes   Hughie Closs, MD [facility]  03/12/2021, 10:41 AM  Please page via Loretha Stapler and do not message via secure chat for anything urgent. Secure chat can be used for anything non urgent.  How to contact the [facility] Attending or Consulting provider 7A - 7P or covering provider during after hours 7P -7A, for this patient?  Check the care team in [facility] and look for a) attending/consulting TRH provider listed and b) the [facility] team listed. Page or secure chat 7A-7P. Log into [email or web address] and use [place] universal password to access. If you do not have the password, please contact the hospital operator. Locate the [facility] provider you are looking for under [facility] and page to a number that you can be directly reached. If you still have difficulty reaching the provider, please page the [facility] (Director on Call) for the Hospitalists listed on amion for assistance.

## 2021-03-12 NOTE — Plan of Care (Signed)
  Problem: Education: Goal: Knowledge of General Education information will improve Description Including pain rating scale, medication(s)/side effects and non-pharmacologic comfort measures Outcome: Progressing   

## 2021-03-12 NOTE — Progress Notes (Signed)
Occupational Therapy Treatment Patient Details Name: Shirley Hansen MRN: [id number] DOB: [birth year] Today's Date: 03/12/2021   History of present illness Pt is a 55 y.o. F who presents with left sided weakness, paresthesia. MRI negative for acute CVA, UA negative. ?PNA as x ray shows right basilar consolidation. Significant PMH: CVA with left sided spastic hemiparesis, nontraumatic subcortical hemorrhage of right cerebral hemisphere, CAD, HTN, chronic anxiety/depression.   OT comments  Pt making progress with OT goals this session. Pt participated in UE stretching and exercises to assist with more functional mobility and strength in her LUE. Exercises are listed below. Pt continues to complain of increased dizziness, even in supine, limiting mobility, as well as reporting the pain in her LUE is limiting her ability to ambulate properly and care for herself. OT will continue to follow up acutely.    Recommendations for follow up therapy are one component of a multi-disciplinary discharge planning process, led by the attending physician.  Recommendations may be updated based on patient status, additional functional criteria and insurance authorization.    Follow Up Recommendations  Home health OT;Supervision - Intermittent (Aide for ADL's)    Equipment Recommendations  None recommended by OT    Recommendations for Other Services      Precautions / Restrictions Precautions Precautions: Fall;Other (comment) Precaution Comments: L spastic hemiplegia (chronic) Restrictions Weight Bearing Restrictions: No       Mobility Bed Mobility Overal bed mobility: Modified Independent Bed Mobility: Supine to Sit     Supine to sit: Modified independent (Device/Increase time)     General bed mobility comments: W/ rails and increased time    Transfers Overall transfer level: Needs assistance               General transfer comment: Deferred due to pt reporting increased dizziness at rest,  insupine    Balance Overall balance assessment: Needs assistance Sitting-balance support: Feet supported;No upper extremity supported Sitting balance-Leahy Scale: Fair Sitting balance - Comments: EOB without support                                   ADL either performed or assessed with clinical judgement   ADL Overall ADL's : Needs assistance/impaired Eating/Feeding: Set up;Sitting Eating/Feeding Details (indicate cue type and reason): assist to open packages and cut food.                                   General ADL Comments: Session focused on bed mobility and UE stretching/exercises     Vision       Perception     Praxis      Cognition Arousal/Alertness: Awake/alert Behavior During Therapy: WFL for tasks assessed/performed Overall Cognitive Status: Within Functional Limits for tasks assessed                                          Exercises Exercises: Other exercises Other Exercises Other Exercises: L scapular mobilizations followed by ROM to  L shoulder to increase ROM and functional use of L arm. Other Exercises: UE stretches: BUE using tray and pushing forward to stretch through the back, across the chest stretches, shouler extension stretches. Other Exercises: UE exercises: BUE x10 reps - shoulder flexion, shoulder abduction, bicep curls, wrist rotations,  hand openers, and shoulder shrugs/scapula squeezes   Shoulder Instructions       General Comments Pt reporting increased dizziness intermittently throughout sesion    Pertinent Vitals/ Pain       Pain Assessment: Faces Faces Pain Scale: Hurts even more Pain Location: L scapula Pain Descriptors / Indicators: Grimacing;Guarding Pain Intervention(s): Limited activity within patient's tolerance;Monitored during session;Repositioned  Home Living                                          Prior Functioning/Environment               Frequency  Min 2X/week        Progress Toward Goals  OT Goals(current goals can now be found in the care plan section)  Progress towards OT goals: Progressing toward goals  Acute Rehab OT Goals Patient Stated Goal: to walk OT Goal Formulation: With patient Time For Goal Achievement: 03/22/21 Potential to Achieve Goals: Good ADL Goals Pt Will Perform Lower Body Bathing: with supervision;sit to/from stand Pt Will Perform Lower Body Dressing: with supervision;sit to/from stand Pt Will Transfer to Toilet: with min guard assist;stand pivot transfer;bedside commode Pt Will Perform Toileting - Clothing Manipulation and hygiene: with min guard assist;sit to/from stand Additional ADL Goal #1: Pt/family member will be independent with LUE exercise program to promote stretching and ROM to increase functional use of LUE.  Plan Discharge plan remains appropriate;Frequency remains appropriate    Co-evaluation                 AM-PAC OT "6 Clicks" Daily Activity     Outcome Measure   Help from another person eating meals?: A Little Help from another person taking care of personal grooming?: A Little Help from another person toileting, which includes using toliet, bedpan, or urinal?: A Little Help from another person bathing (including washing, rinsing, drying)?: A Little Help from another person to put on and taking off regular upper body clothing?: A Little Help from another person to put on and taking off regular lower body clothing?: A Little 6 Click Score: 18    End of Session    OT Visit Diagnosis: Unsteadiness on feet (R26.81);Hemiplegia and hemiparesis Hemiplegia - Right/Left: Left Hemiplegia - dominant/non-dominant: Non-Dominant Hemiplegia - caused by:  (Prior CVA)   Activity Tolerance Patient tolerated treatment well   Patient Left in bed;with call bell/phone within reach   Nurse Communication Mobility status        Time: 6269-4854 OT Time Calculation  (min): 36 min  Charges: OT General Charges $OT Visit: 1 Visit OT Treatments $Therapeutic Activity: 8-22 mins $Therapeutic Exercise: 8-22 mins  Alverta Caccamo H., OTR/L Acute Rehabilitation  Jahmel Flannagan Elane Rosemaria Inabinet 03/12/2021, 5:57 PM

## 2021-03-13 DIAGNOSIS — R42 Dizziness and giddiness: Secondary | ICD-10-CM

## 2021-03-13 DIAGNOSIS — I951 Orthostatic hypotension: Secondary | ICD-10-CM

## 2021-03-13 MED ORDER — CARVEDILOL 12.5 MG PO TABS: 12.5000 mg | ORAL_TABLET | Freq: Two times a day (BID) | ORAL | 0 refills | Status: DC

## 2021-03-13 NOTE — Plan of Care (Signed)

## 2021-03-13 NOTE — TOC Transition Note (Signed)
Transition of Care [facility]) - CM/SW Discharge Note   Patient Details  Name: Sumner Boesch MRN: [id number] Date of Birth: 12-02-1965  Transition of [facility]) CM/SW Contact:  Lawerance Sabal, RN Phone Number: 03/13/2021, 9:13 AM   Clinical Narrative:   Notified [facility] HH liaison, Amy, that patient will discharge today. Added [place] office to AVS for patient to call on Monday morning to schedule follow up.     Final next level of care: Home w Home Health Services Barriers to Discharge: No Barriers Identified   Patient Goals and CMS Choice Patient states their goals for this hospitalization and ongoing recovery are:: get back walking CMS Medicare.gov Compare Post Acute Care list provided to:: Patient    Discharge Placement                       Discharge Plan and Services In-house Referral: Clinical Social Work   Post Acute Care Choice: Skilled Nursing Facility                    HH Arranged: PT, OT, Social Work [facility] Agency: [facility] Date [facility] Agency Contacted: 03/11/21 Time HH Agency Contacted: 1026 Representative spoke with at [facility] Agency: Amy  Social Determinants of Health (SDOH) Interventions     Readmission Risk Interventions No flowsheet data found.

## 2021-03-13 NOTE — Discharge Summary (Signed)
Physician Discharge Summary  Paulyne Mooty ZDG:[id number] DOB: 08-23-1965 DOA: 03/05/2021  PCP: Oneita Hurt, No  Admit date: 03/05/2021 Discharge date: 03/13/2021 30 Day Unplanned Readmission Risk Score    Flowsheet Row ED to Hosp-Admission (Current) from 03/05/2021 in [facility]  30 Day Unplanned Readmission Risk Score (%) 17.69 Filed at 03/13/2021 0801       This score is the patient's risk of an unplanned readmission within 30 days of being discharged (0 -100%). The score is based on dignosis, age, lab data, medications, orders, and past utilization.   Low:  0-14.9   Medium: 15-21.9   High: 22-29.9   Extreme: 30 and above          Admitted From: Home Disposition: Home  Recommendations for Outpatient Follow-up:  Follow up with PCP in 1-2 weeks Please obtain BMP/CBC in one week Please follow up with your PCP on the following pending results: Unresulted Labs (From admission, onward)    None         Home Health: Yes Equipment/Devices: None  Discharge Condition: Stable CODE STATUS: Full code Diet recommendation: Cardiac  Subjective: Seen and examined.  No complaints other than mild dizziness.  Brief/Interim Summary: Rika Daughdrill is an 55 y.o. female with medical history significant for CVA with left-sided spastic hemiparesis followed by neurology at [facility] health, muscle spasm of left lower extremity, nontraumatic subcortical hemorrhage of right cerebral hemisphere, muscle spasticity, history of hypokalemia, coronary artery disease with history of MI in 2017, essential hypertension, hyperlipidemia, chronic anxiety/depression, who presented to [facility] ED due to worsening left-sided weakness and paresthesia.  She was admitted to hospitalist service with concern of possible stroke.  However MRI brain negative for acute CVA, UA negative.  Lab studies unremarkable.  PT OT assessed her and recommended SNF. CIR was also consulted at patient's request but she was deemed not an  appropriate candidate for them.  Due to dizziness and positive orthostatics, started on fluids, orthostatics improved before being positive again.  Her Coreg was reduced to half dose which will be 12.5 mg p.o. twice daily.  Her dizziness improved but he still had some dizziness however her orthostatics have improved significantly.  Upon review of home medications, it appears that patient on several medications which can cause dizziness and the biggest 1 of those are baclofen and oxycodone on top of lamotrigine and Cymbalta.  I did discuss with the patient that the dizziness is likely secondary to polypharmacy however she is willing to cut off any of those medications.  I strongly advised her to talk to her neurologist about possibly trying to cut down some of those medications.  TOC was working hard to find placement but she was not accepted anywhere and in the meantime, patient had progressed physically significantly to the point that PT OT updated the recommendations and recommended home with home health and patient was also agreeable with that.  Her blood pressure was also low and for that reason, many of her antihypertensives were also held here and since her blood pressure has remained stable on only Coreg 12.5 mg, her amlodipine, hydrochlorothiazide and lisinopril are being discontinued at this point in time.  Patient was a stable for discharge yesterday however she requested to keep her yesterday since there was no family member available to be with her and now that her family is back at home, she is being discharged in stable condition.   Pneumonia ruled out based off of negative procalcitonin and no other clinical  signs of pneumonia.  Discharge Diagnoses:  Active Problems:   COVID-19   Weakness   Dizziness   Orthostatic hypotension    Discharge Instructions  Discharge Instructions     Face-to-face encounter (required for Medicare/Medicaid patients)   Complete by: As directed    I Holley Dexter certify that this patient is under my care and that I, or a nurse practitioner or physician's assistant working with me, had a face-to-face encounter that meets the physician face-to-face encounter requirements with this patient on 03/05/2021. 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 Very de-conditioned from multiple hospitalizations. She has a 47 year old niece who helps but is struggling to meet needs.   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   I certify that, based on my findings, the following services are medically necessary home health services:  Nursing Physical therapy     Reason for Medically Necessary Home Health Services:  Therapy- Therapeutic Exercises to Increase Strength and Endurance Therapy- Investment banker, operational, Patent examiner Therapy- Home Adaptation to Facilitate Safety Skilled Nursing- Change/Decline in Patient Status     My clinical findings support the need for the above services: OTHER SEE COMMENTS   Further, I certify that my clinical findings support that this patient is homebound due to:  Ambulates short distances less than 300 feet Unsafe ambulation due to balance issues     Home Health   Complete by: As directed    To provide the following care/treatments:  PT Home Health Aide        Allergies as of 03/13/2021   No Known Allergies      Medication List     STOP taking these medications    amLODipine 5 MG tablet Commonly known as: NORVASC   hydrochlorothiazide 25 MG tablet Commonly known as: HYDRODIURIL   lisinopril 10 MG tablet Commonly known as: ZESTRIL   potassium chloride SA 20 MEQ  tablet Commonly known as: KLOR-CON       TAKE these medications    acetaminophen 325 MG tablet Commonly known as: TYLENOL Take 650 mg by mouth every 6 (six) hours as needed for mild pain.   albuterol 108 (90 Base) MCG/ACT inhaler Commonly known as: VENTOLIN HFA Inhale 2 puffs into the lungs every 2 (two) hours as needed for wheezing or shortness of breath.   amitriptyline 25 MG tablet Commonly known as: ELAVIL Take 25 mg by mouth daily at 6 (six) AM.   aspirin EC 81 MG tablet Take 81 mg by mouth daily. Swallow whole.   atorvastatin 80 MG tablet Commonly known as: LIPITOR Take 80 mg by mouth at bedtime.   baclofen 10 MG tablet Commonly known as: LIORESAL Take 10 mg by mouth 4 (four) times daily.   brimonidine 0.2 % ophthalmic solution Commonly known as: ALPHAGAN Place 1 drop into both eyes 2 (two) times daily.   carvedilol 12.5 MG tablet Commonly known as: COREG Take 1 tablet (12.5 mg total) by mouth 2 (two) times daily. What changed:  medication strength how much to take   diclofenac Sodium 1 % Gel Commonly known as: VOLTAREN Apply 1 application topically 4 (four) times daily as needed (pain).  dorzolamide-timolol 22.3-6.8 MG/ML ophthalmic solution Commonly known as: COSOPT Place 1 drop into both eyes 2 (two) times daily.   DULoxetine 60 MG capsule Commonly known as: CYMBALTA Take 60 mg by mouth daily.   escitalopram 5 MG tablet Commonly known as: LEXAPRO Take 5 mg by mouth daily.   GENTEAL TEARS OP Place 1 drop into both eyes daily as needed (dry eyes).   lamoTRIgine 25 MG tablet Commonly known as: LAMICTAL Take 25 mg by mouth daily.   latanoprost 0.005 % ophthalmic solution Commonly known as: XALATAN Place 1 drop into both eyes at bedtime.   meclizine 25 MG tablet Commonly known as: ANTIVERT Take 1 tablet (25 mg total) by mouth 3 (three) times daily as needed for dizziness.   Netarsudil Dimesylate 0.02 % Soln Place 1 drop into both eyes at  bedtime.   oxyCODONE 5 MG immediate release tablet Commonly known as: Oxy IR/ROXICODONE Take 5 mg by mouth every 6 (six) hours as needed for pain.        Follow-up Information     [facility] Follow up.   Specialty: Home Health Services Why: This company, now called [facility], will contact you to schedule your first home visit. Contact information: [address] [place] [address] [phone]         [facility] Follow up.   Why: Call to reschedule appointment Contact information: [address] [place] [address]               No Known Allergies  Consultations: None   Procedures/Studies: DG Chest 2 View  Result Date: 02/22/2021 CLINICAL DATA:  Chest pain, left-sided weakness EXAM: CHEST - 2 VIEW COMPARISON:  Chest radiograph 07/15/2020 FINDINGS: The cardiomediastinal silhouette is normal. Lung volumes are mildly low. There is no focal consolidation or pulmonary edema. There is no pleural effusion or pneumothorax. There is no acute osseous abnormality. IMPRESSION: No radiographic evidence of acute cardiopulmonary process. Electronically Signed   By: Lesia Hausen M.D.   On: 02/22/2021 12:14   DG Thoracic Spine 2 View  Result Date: 02/22/2021 CLINICAL DATA:  Larey Seat, pain EXAM: THORACIC SPINE 2 VIEWS COMPARISON:  None. FINDINGS: Frontal and lateral views of the thoracic spine are obtained. Alignment is anatomic. There are no acute displaced fractures. Mild lower thoracic spondylosis is noted. Paraspinal soft tissues are unremarkable. IMPRESSION: 1. Mild lower thoracic spondylosis.  No acute bony abnormality. Electronically Signed   By: Sharlet Salina M.D.   On: 02/22/2021 22:37   DG Lumbar Spine Complete  Result Date: 02/22/2021 CLINICAL DATA:  Larey Seat, left-sided pain EXAM: LUMBAR SPINE - COMPLETE 4+ VIEW COMPARISON:  None. FINDINGS: Frontal, bilateral oblique, lateral views of the lumbar spine are obtained.  There are 5 non-rib-bearing lumbar type vertebral bodies identified. Left convex scoliosis is centered at L3-4. Otherwise alignment is anatomic. No acute fractures. Moderate spondylosis throughout the lumbar spine greatest at L3-4 and L4-5. There is diffuse facet hypertrophy. Sacroiliac joints are unremarkable. IMPRESSION: 1. Moderate spondylosis and facet hypertrophy.  No acute fracture. 2. Mild left convex scoliosis. Electronically Signed   By: Sharlet Salina M.D.   On: 02/22/2021 22:38   CT HEAD WO CONTRAST  Result Date: 03/05/2021 CLINICAL DATA:  Neuro deficit, acute, stroke suspected EXAM: CT HEAD WITHOUT CONTRAST TECHNIQUE: Contiguous axial images were obtained from the base of the skull through the vertex without intravenous contrast. COMPARISON:  02/17/2021 FINDINGS: Brain: No evidence of acute infarction, hemorrhage, hydrocephalus, extra-axial collection  or mass lesion/mass effect. Chronic changes related to prior right thalamic hemorrhage are unchanged. Small remote lacunar infarcts again noted in the pons and left corona radiata. Vascular: Atherosclerotic calcifications involving the large vessels of the skull base. No unexpected hyperdense vessel. Skull: Normal. Negative for fracture or focal lesion. Sinuses/Orbits: No acute finding. Other: None. IMPRESSION: No acute intracranial findings.  Stable exam from 02/17/2021. Electronically Signed   By: Duanne Guess D.O.   On: 03/05/2021 14:22   CT HEAD WO CONTRAST  Result Date: 02/17/2021 CLINICAL DATA:  Trip and fall 5 days ago, intermittent LEFT-sided weakness in a 55 year old female. EXAM: CT HEAD WITHOUT CONTRAST TECHNIQUE: Contiguous axial images were obtained from the base of the skull through the vertex without intravenous contrast. COMPARISON:  July 05, 2020. FINDINGS: Brain: No evidence of acute infarction, hemorrhage, hydrocephalus, extra-axial collection or mass lesion/mass effect. Signs of atrophy and prior hemorrhage and/or infarct  in the RIGHT thalamic region tracking into corona radiata with similar appearance. Atrophy and chronic microvascular ischemic changes are otherwise similar to the prior study with chronic lacunar infarcts in the LEFT corona radiata. Vascular: No hyperdense vessel or unexpected calcification. Skull: Normal. Negative for fracture or focal lesion. Sinuses/Orbits: Visualized paranasal sinuses and orbits are unremarkable. Other: None IMPRESSION: No acute intracranial process. Signs of atrophy and chronic microvascular ischemic changes. Signs of prior, prior insult to the RIGHT thalamic capsular junction and evidence of LEFT-sided lacunar infarcts as on the prior study. Electronically Signed   By: Donzetta Kohut M.D.   On: 02/17/2021 11:12   CT Cervical Spine Wo Contrast  Result Date: 02/22/2021 CLINICAL DATA:  Neck trauma EXAM: CT CERVICAL SPINE WITHOUT CONTRAST TECHNIQUE: Multidetector CT imaging of the cervical spine was performed without intravenous contrast. Multiplanar CT image reconstructions were also generated. COMPARISON:  None. FINDINGS: Alignment: Reversal of the normal cervical lordosis, likely positional. Skull base and vertebrae: No acute fracture. No primary bone lesion or focal pathologic process. Soft tissues and spinal canal: No prevertebral fluid or swelling. No visible canal hematoma. Disc levels: Mild degenerative changes of the mid/lower cervical spine. Spinal canal is patent. Upper chest: Visualized lung apices are clear. Other: Visualized thyroid is notable for a 16 mm right thyroid nodule (series 5/image 69). IMPRESSION: No evidence of traumatic injury to the cervical spine. Mild degenerative changes. 16 mm right thyroid nodule. Recommend thyroid US (ref: J Am Coll Radiol. 2015 Feb;12(2): 143-50). Electronically Signed   By: Charline Bills M.D.   On: 02/22/2021 21:55   MR Brain Wo Contrast (neuro protocol)  Result Date: 03/05/2021 CLINICAL DATA:  Neuro deficit, acute, stroke suspected.  EXAM: MRI HEAD WITHOUT CONTRAST TECHNIQUE: Multiplanar, multiecho pulse sequences of the brain and surrounding structures were obtained without intravenous contrast. COMPARISON:  Head CT 03/05/2021.  Brain MRI 07/05/2020 FINDINGS: Brain: Mild generalized cerebral and cerebellar atrophy. Redemonstrated chronic small-vessel infarcts within the bilateral corona radiata, left basal ganglia, left thalamus, and within the pons. Redemonstrated sequela of remote hemorrhage within the right corona radiata/thalamus. 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 There is no acute infarct. No evidence of an intracranial mass. No extra-axial fluid collection. No midline shift. Vascular: Maintained flow voids within the proximal large arterial vessels. Skull and upper cervical spine: No focal suspicious marrow lesion Sinuses/Orbits: Visualized orbits show no acute finding. No significant paranasal sinus disease. IMPRESSION: No evidence of acute intracranial abnormality. Redemonstrated sequela of remote hemorrhage within the right corona  radiata/thalamus. Redemonstrated chronic small-vessel infarcts within the bilateral corona radiata, left basal ganglia, left thalamus and within the pons. 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 Mild generalized parenchymal atrophy. Electronically Signed   By: Jackey Loge D.O.   On: 03/05/2021 16:01   DG Chest Portable 1 View  Result Date: 03/05/2021 CLINICAL DATA:  Fall, weakness EXAM: PORTABLE CHEST 1 VIEW COMPARISON:  02/22/2021 FINDINGS: There is interval development of right basilar consolidation possibly reflecting changes of acute lobar pneumonia or aspiration in the acute setting. Pulmonary  insufflation is normal and symmetric. No pneumothorax or pleural effusion. Cardiac size within normal limits. Pulmonary vascularity is normal. No acute bone abnormality. IMPRESSION: Right basilar consolidation, new since prior examination, possibly reflecting changes of acute lobar pneumonia or aspiration. Electronically Signed   By: Helyn Numbers M.D.   On: 03/05/2021 21:18   DG Shoulder Left  Result Date: 02/17/2021 CLINICAL DATA:  Left shoulder pain after fall. EXAM: LEFT SHOULDER - 2+ VIEW COMPARISON:  None. FINDINGS: There is no evidence of fracture or dislocation. There is no evidence of significant arthropathy or other focal bone abnormality. Soft tissues are unremarkable. IMPRESSION: No acute osseous abnormality. Electronically Signed   By: Maudry Mayhew M.D.   On: 02/17/2021 18:21   DG Knee Complete 4 Views Left  Result Date: 02/17/2021 CLINICAL DATA:  Pain after fall. EXAM: LEFT KNEE - COMPLETE 4+ VIEW COMPARISON:  None. FINDINGS: No evidence of fracture, dislocation, or joint effusion. No evidence of arthropathy or other focal bone abnormality. Soft tissues are unremarkable. IMPRESSION: Negative. Electronically Signed   By: Charlett Nose M.D.   On: 02/17/2021 19:50     Discharge Exam: Vitals:   03/13/21 0407 03/13/21 0835  BP: 118/82 (!) 147/90  Pulse: 85 93  Resp: 18 17  Temp: 97.7 F (36.5 C) 98.2 F (36.8 C)  SpO2: 97% 98%   Vitals:   03/12/21 1500 03/12/21 2115 03/13/21 0407 03/13/21 0835  BP: 124/83 (!) 146/96 118/82 (!) 147/90  Pulse: 81 92 85 93  Resp: Temp: 97.6 F (36.4 C) 98.2 F (36.8 C) 97.7 F (36.5 C) 98.2 F (36.8 C)  TempSrc: Oral  Oral Oral  SpO2:  97% 97% 98%  Weight:      Height:        General: Pt is alert, awake, not in acute distress Cardiovascular: RRR, S1/S2 +, no rubs, no gallops Respiratory: CTA bilaterally, no wheezing, no rhonchi Abdominal: Soft, NT, ND, bowel sounds + Extremities: no edema, no cyanosis Neuro: 4/5 power in  left arm and left leg.  No other neurological deficit.    The results of significant diagnostics from this hospitalization (including imaging, microbiology, ancillary and laboratory) are listed below for reference.     Microbiology: Recent Results (from the past 240 hour(s))  SARS CORONAVIRUS 2 (TAT 6-24 HRS) Nasopharyngeal Nasopharyngeal Swab     Status: None   Collection Time: 03/05/21  9:30 PM   Specimen: Nasopharyngeal Swab  Result Value Ref Range Status   SARS Coronavirus 2 NEGATIVE NEGATIVE Final    Comment: (NOTE) SARS-CoV-2 target nucleic acids are NOT DETECTED.  The SARS-CoV-2 RNA is generally detectable in upper and lower respiratory specimens during the acute phase of infection. Negative results do not preclude SARS-CoV-2 infection, do not rule out co-infections with other pathogens, and should not be used as the sole basis for  treatment or other patient management decisions. Negative results must be combined with clinical observations, patient history, and epidemiological information. The expected result is Negative.  Fact Sheet for Patients: HairSlick.no  Fact Sheet for Healthcare Providers: quierodirigir.com  This test is not yet approved or cleared by the [place] FDA and  has been authorized for detection and/or diagnosis of SARS-CoV-2 by FDA under an Emergency Use Authorization (EUA). This EUA will remain  in effect (meaning this test can be used) for the duration of the COVID-19 declaration under Se ction 564(b)(1) of the Act, 21 U.S.C. section 360bbb-3(b)(1), unless the authorization is terminated or revoked sooner.  Performed at [facility], [address], [place], [place] [address]      Labs: BNP (last 3 results) Recent Labs    07/17/20 0453 07/21/20 0105 07/22/20 0144  BNP 247.5* 69.8 96.5   Basic Metabolic Panel: Recent Labs  Lab 03/11/21 0143  NA 141  K 3.5  CL 109   CO2 24  GLUCOSE 87  BUN 12  CREATININE 0.79  CALCIUM 8.1*   Liver Function Tests: No results for input(s): AST, ALT, ALKPHOS, BILITOT, PROT, ALBUMIN in the last 168 hours. No results for input(s): LIPASE, AMYLASE in the last 168 hours. No results for input(s): AMMONIA in the last 168 hours. CBC: No results for input(s): WBC, NEUTROABS, HGB, HCT, MCV, PLT in the last 168 hours. Cardiac Enzymes: No results for input(s): CKTOTAL, CKMB, CKMBINDEX, TROPONINI in the last 168 hours. BNP: Invalid input(s): POCBNP CBG: No results for input(s): GLUCAP in the last 168 hours. D-Dimer No results for input(s): DDIMER in the last 72 hours. Hgb A1c No results for input(s): HGBA1C in the last 72 hours. Lipid Profile No results for input(s): CHOL, HDL, LDLCALC, TRIG, CHOLHDL, LDLDIRECT in the last 72 hours. Thyroid function studies No results for input(s): TSH, T4TOTAL, T3FREE, THYROIDAB in the last 72 hours.  Invalid input(s): FREET3 Anemia work up No results for input(s): VITAMINB12, FOLATE, FERRITIN, TIBC, IRON, RETICCTPCT in the last 72 hours. Urinalysis    Component Value Date/Time   COLORURINE YELLOW 03/05/2021 1631   APPEARANCEUR CLEAR 03/05/2021 1631   LABSPEC 1.010 03/05/2021 1631   PHURINE 7.0 03/05/2021 1631   GLUCOSEU NEGATIVE 03/05/2021 1631   HGBUR NEGATIVE 03/05/2021 1631   BILIRUBINUR NEGATIVE 03/05/2021 1631   KETONESUR NEGATIVE 03/05/2021 1631   PROTEINUR NEGATIVE 03/05/2021 1631   NITRITE NEGATIVE 03/05/2021 1631   LEUKOCYTESUR NEGATIVE 03/05/2021 1631   Sepsis Labs Invalid input(s): PROCALCITONIN,  WBC,  LACTICIDVEN Microbiology Recent Results (from the past 240 hour(s))  SARS CORONAVIRUS 2 (TAT 6-24 HRS) Nasopharyngeal Nasopharyngeal Swab     Status: None   Collection Time: 03/05/21  9:30 PM   Specimen: Nasopharyngeal Swab  Result Value Ref Range Status   SARS Coronavirus 2 NEGATIVE NEGATIVE Final    Comment: (NOTE) SARS-CoV-2 target nucleic acids are NOT  DETECTED.  The SARS-CoV-2 RNA is generally detectable in upper and lower respiratory specimens during the acute phase of infection. Negative results do not preclude SARS-CoV-2 infection, do not rule out co-infections with other pathogens, and should not be used as the sole basis for treatment or other patient management decisions. Negative results must be combined with clinical observations, patient history, and epidemiological information. The expected result is Negative.  Fact Sheet for Patients: HairSlick.no  Fact Sheet for Healthcare Providers: quierodirigir.com  This test is not yet approved or cleared by the [place] FDA and  has been authorized for detection  and/or diagnosis of SARS-CoV-2 by FDA under an Emergency Use Authorization (EUA). This EUA will remain  in effect (meaning this test can be used) for the duration of the COVID-19 declaration under Se ction 564(b)(1) of the Act, 21 U.S.C. section 360bbb-3(b)(1), unless the authorization is terminated or revoked sooner.  Performed at [facility], [address], [place], [place] [address]      Time coordinating discharge: Over 30 minutes  SIGNED:   Hughie Closs, MD  [facility] 03/13/2021, 10:18 AM  If 7PM-7AM, please contact night-coverage [email or web address]

## 2021-03-22 ENCOUNTER — Encounter: Payer: Self-pay | Admitting: Physician Assistant

## 2021-03-22 ENCOUNTER — Ambulatory Visit: Payer: Medicaid Other | Attending: Family Medicine | Admitting: Physician Assistant

## 2021-03-22 ENCOUNTER — Other Ambulatory Visit: Payer: Self-pay

## 2021-03-22 VITALS — BP 168/110 | HR 95 | Temp 98.6°F | Resp 18 | Ht 64.0 in | Wt 187.0 lb

## 2021-03-22 DIAGNOSIS — Z1211 Encounter for screening for malignant neoplasm of colon: Secondary | ICD-10-CM

## 2021-03-22 DIAGNOSIS — I10 Essential (primary) hypertension: Secondary | ICD-10-CM | POA: Diagnosis not present

## 2021-03-22 DIAGNOSIS — M25512 Pain in left shoulder: Secondary | ICD-10-CM

## 2021-03-22 DIAGNOSIS — H409 Unspecified glaucoma: Secondary | ICD-10-CM

## 2021-03-22 DIAGNOSIS — N1831 Chronic kidney disease, stage 3a: Secondary | ICD-10-CM

## 2021-03-22 DIAGNOSIS — Z8673 Personal history of transient ischemic attack (TIA), and cerebral infarction without residual deficits: Secondary | ICD-10-CM

## 2021-03-22 DIAGNOSIS — F32A Depression, unspecified: Secondary | ICD-10-CM

## 2021-03-22 DIAGNOSIS — R42 Dizziness and giddiness: Secondary | ICD-10-CM | POA: Diagnosis not present

## 2021-03-22 DIAGNOSIS — I951 Orthostatic hypotension: Secondary | ICD-10-CM

## 2021-03-22 DIAGNOSIS — G8929 Other chronic pain: Secondary | ICD-10-CM

## 2021-03-22 MED ORDER — OXYCODONE HCL 5 MG PO TABS: 5.0000 mg | ORAL_TABLET | Freq: Four times a day (QID) | ORAL | 0 refills | Status: AC | PRN

## 2021-03-22 MED ORDER — CYCLOBENZAPRINE HCL 10 MG PO TABS
10.0000 mg | ORAL_TABLET | Freq: Three times a day (TID) | ORAL | 0 refills | Status: DC | PRN
Start: 2021-03-22 — End: 2021-04-05

## 2021-03-22 MED ORDER — CYCLOBENZAPRINE HCL 10 MG PO TABS: 10.0000 mg | ORAL_TABLET | Freq: Three times a day (TID) | ORAL | 0 refills | Status: DC | PRN

## 2021-03-22 NOTE — Patient Instructions (Addendum)
I encourage you to check your blood pressure at home, keep a written log and have available for your next office visit with me in 2 weeks.  You will stop baclofen at this time and do a trial of Flexeril 3 times a day as needed to help with your left shoulder and arm pain  Shirley Jaffe, PA-C Physician Assistant [facility] [email or web address]  How to Take Your Blood Pressure Blood pressure is a measurement of how strongly your blood is pressing against the walls of your arteries. Arteries are blood vessels that carry blood from your heart throughout your body. Your health care provider takes your blood pressure at each office visit. You can also take your own blood pressure at home with a blood pressure monitor. You may need to take your own blood pressure to: Confirm a diagnosis of high blood pressure (hypertension). Monitor your blood pressure over time. Make sure your blood pressure medicine is working. Supplies needed: Blood pressure monitor. Dining room chair to sit in. Table or desk. Small notebook and pencil or pen. How to prepare To get the most accurate reading, avoid the following for 30 minutes before you check your blood pressure: Drinking caffeine. Drinking alcohol. Eating. Smoking. Exercising. Five minutes before you check your blood pressure: Use the bathroom and urinate so that you have an empty bladder. Sit quietly in a dining room chair. Do not sit in a soft couch or an armchair. Do not talk. How to take your blood pressure To check your blood pressure, follow the instructions in the manual that came with your blood pressure monitor. If you have a digital blood pressure monitor, the instructions may be as follows: Sit up straight in a chair. Place your feet on the floor. Do not cross your ankles or legs. Rest your left arm at the level of your heart on a table or desk or on the arm of a chair. Pull up your  shirt sleeve. Wrap the blood pressure cuff around the upper part of your left arm, 1 inch (2.5 cm) above your elbow. It is best to wrap the cuff around bare skin. Fit the cuff snugly around your arm. You should be able to place only one finger between the cuff and your arm. Position the cord so that it rests in the bend of your elbow. Press the power button. Sit quietly while the cuff inflates and deflates. Read the digital reading on the monitor screen and write the numbers down (record them) in a notebook. Wait 2-3 minutes, then repeat the steps, starting at step 1. What does my blood pressure reading mean? A blood pressure reading consists of a higher number over a lower number. Ideally, your blood pressure should be below 120/80. The first ("top") number is called the systolic pressure. It is a measure of the pressure in your arteries as your heart beats. The second ("bottom") number is called the diastolic pressure. It is a measure of the pressure in your arteries as the heart relaxes. Blood pressure is classified into five stages. The following are the stages for adults who do not have a short-term serious illness or a chronic condition. Systolic pressure and diastolic pressure are measured in a unit called mm Hg (millimeters of mercury).  Normal Systolic pressure: below 120. Diastolic pressure: below 80. Elevated Systolic pressure: 120-129. Diastolic pressure: below 80. Hypertension stage 1 Systolic pressure: 130-139. Diastolic pressure: 80-89. Hypertension stage 2 Systolic pressure: 140 or above. Diastolic pressure: 90 or above.  You can have elevated blood pressure or hypertension even if only the systolic or only the diastolic number in your reading is higher than normal. Follow these instructions at home: Medicines Take over-the-counter and prescription medicines only as told by your health care provider. Tell your health care provider if you are having any side effects from  blood pressure medicine. General instructions Check your blood pressure as often as recommended by your health care provider. Check your blood pressure at the same time every day. Take your monitor to the next appointment with your health care provider to make sure that: You are using it correctly. It provides accurate readings. Understand what your goal blood pressure numbers are. Keep all follow-up visits as told by your health care provider. This is important. General tips Your health care provider can suggest a reliable monitor that will meet your needs. There are several types of home blood pressure monitors. Choose a monitor that has an arm cuff. Do not choose a monitor that measures your blood pressure from your wrist or finger. Choose a cuff that wraps snugly around your upper arm. You should be able to fit only one finger between your arm and the cuff. You can buy a blood pressure monitor at most drugstores or online. Where to find more information American Heart Association: www.heart.org Contact a health care provider if: Your blood pressure is consistently high. Your blood pressure is suddenly low. Get help right away if: Your systolic blood pressure is higher than 180. Your diastolic blood pressure is higher than 120. Summary Blood pressure is a measurement of how strongly your blood is pressing against the walls of your arteries. A blood pressure reading consists of a higher number over a lower number. Ideally, your blood pressure should be below 120/80. Check your blood pressure at the same time every day. Avoid caffeine, alcohol, smoking, and exercise for 30 minutes prior to checking your blood pressure. These agents can affect the accuracy of the blood pressure reading. This information is not intended to replace advice given to you by your health care provider. Make sure you discuss any questions you have with your health care provider. Document Revised: 04/07/2020  Document Reviewed: 05/23/2019 Elsevier Patient Education  2022 [employer].

## 2021-03-22 NOTE — Progress Notes (Signed)
Patient has taken medication today around 11 am. and patient has eaten today Patient reports left shoulder pain and back pain since falling. Patient reports tightening in the left arm/bicep. Patient states PT recommend MRI for soft tissue imagining prior to extensive therapy.

## 2021-03-22 NOTE — Progress Notes (Signed)
New Patient Office Visit  Subjective:  Patient ID: Shirley Hansen, female    DOB: 10-16-1965  Age: 55 y.o. MRN: [id number]  CC:  Chief Complaint  Patient presents with   Establish Care    HPI Shirley Hansen she was hospitalized from September 24 through March 13, 2021.  Hospital course    Brief/Interim Summary: Shirley Hansen is an 55 y.o. female with medical history significant for CVA with left-sided spastic hemiparesis followed by neurology at [facility], muscle spasm of left lower extremity, nontraumatic subcortical hemorrhage of right cerebral hemisphere, muscle spasticity, history of hypokalemia, coronary artery disease with history of MI in 2017, essential hypertension, hyperlipidemia, chronic anxiety/depression, who presented to [facility] ED due to worsening left-sided weakness and paresthesia.  She was admitted to hospitalist service with concern of possible stroke.  However MRI brain negative for acute CVA, UA negative.  Lab studies unremarkable.  PT OT assessed her and recommended SNF. CIR was also consulted at patient's request but she was deemed not an appropriate candidate for them.  Due to dizziness and positive orthostatics, started on fluids, orthostatics improved before being positive again.  Her Coreg was reduced to half dose which will be 12.5 mg p.o. twice daily.  Her dizziness improved but he still had some dizziness however her orthostatics have improved significantly.  Upon review of home medications, it appears that patient on several medications which can cause dizziness and the biggest 1 of those are baclofen and oxycodone on top of lamotrigine and Cymbalta.  I did discuss with the patient that the dizziness is likely secondary to polypharmacy however she is willing to cut off any of those medications.  I strongly advised her to talk to her neurologist about possibly trying to cut down some of those medications.  TOC was working hard to find placement but she was not accepted  anywhere and in the meantime, patient had progressed physically significantly to the point that PT OT updated the recommendations and recommended home with home health and patient was also agreeable with that.  Her blood pressure was also low and for that reason, many of her antihypertensives were also held here and since her blood pressure has remained stable on only Coreg 12.5 mg, her amlodipine, hydrochlorothiazide and lisinopril are being discontinued at this point in time.  Patient was a stable for discharge yesterday however she requested to keep her yesterday since there was no family member available to be with her and now that her family is back at home, she is being discharged in stable condition.   Pneumonia ruled out based off of negative procalcitonin and no other clinical signs of pneumonia.      States today that she has been checking her blood pressure at home, states the first couple days after she was hospitalized, her blood pressures were 120/88, states that they tend to stay within normal limits but has had a couple of higher readings of 140/90.  Reports that she continues to have pain in her left arm and her left leg.  Reports that she did fall on her affected side prior to her hospitalization.  Reports that she has been using baclofen without much relief.  Reports that she has failed gabapentin due to adverse effect of dizziness.  Reports that she was on Lyrica, but her PCP took her off of it, states that she does not know the reason.  States that she was prescribed oxycodone, states that she uses it sparingly with relief for  severe pain.  Reports that her previous providers were at [facility] in [place], states due to her insurance she is unable to be seen there anymore.  Does report that she needs to be seen by ophthalmology due to glaucoma.   Past Medical History:  Diagnosis Date   Hypertension    Pancreatitis    Stage 3a chronic kidney disease (HCC)    Stroke [facility])     Past  Surgical History:  Procedure Laterality Date   TUBAL LIGATION      History reviewed. No pertinent family history.  Social History   Socioeconomic History   Marital status: Single    Spouse name: Not on file   Number of children: Not on file   Years of education: Not on file   Highest education level: Not on file  Occupational History   Not on file  Tobacco Use   Smoking status: Former   Smokeless tobacco: Never  Substance and Sexual Activity   Alcohol use: Never   Drug use: Never   Sexual activity: Not on file  Other Topics Concern   Not on file  Social History Narrative   Not on file   Social Determinants of Health   Financial Resource Strain: Not on file  Food Insecurity: Not on file  Transportation Needs: Not on file  Physical Activity: Not on file  Stress: Not on file  Social Connections: Not on file  Intimate Partner Violence: Not on file    ROS Review of Systems  Constitutional: Negative.   HENT: Negative.    Eyes:  Positive for redness.  Respiratory:  Negative for shortness of breath.   Cardiovascular:  Negative for chest pain.  Gastrointestinal: Negative.   Endocrine: Negative.   Genitourinary: Negative.   Musculoskeletal:  Positive for arthralgias and gait problem.  Skin: Negative.   Allergic/Immunologic: Negative.   Neurological:  Positive for weakness. Negative for dizziness, seizures, speech difficulty, light-headedness, numbness and headaches.  Hematological: Negative.   Psychiatric/Behavioral: Negative.     Objective:   Today's Vitals: BP (!) 168/110 (BP Location: Left Arm, Patient Position: Sitting, Cuff Size: Normal)   Pulse 95   Temp 98.6 F (37 C) (Oral)   Resp 18   Ht 5\' 4"  (1.626 m)   Wt 187 lb (84.8 kg)   SpO2 99%   BMI 32.10 kg/m   Physical Exam Vitals and nursing note reviewed.  Constitutional:      Appearance: Normal appearance.     Comments: In wheelchair  HENT:     Head: Normocephalic and atraumatic.     Right Ear:  External ear normal.     Left Ear: External ear normal.     Nose: Nose normal.     Mouth/Throat:     Mouth: Mucous membranes are moist.     Pharynx: Oropharynx is clear.  Eyes:     Extraocular Movements: Extraocular movements intact.     Conjunctiva/sclera: Conjunctivae normal.     Pupils: Pupils are equal, round, and reactive to light.  Cardiovascular:     Rate and Rhythm: Normal rate and regular rhythm.     Pulses: Normal pulses.     Heart sounds: Normal heart sounds.  Pulmonary:     Effort: Pulmonary effort is normal.     Breath sounds: Normal breath sounds.  Musculoskeletal:     Right shoulder: Normal.     Left shoulder: Tenderness present. No swelling. Decreased range of motion. Decreased strength.     Right hand: Normal.  Left hand: No swelling. Decreased range of motion. Decreased strength.     Cervical back: Normal range of motion and neck supple.  Skin:    General: Skin is warm and dry.  Neurological:     General: No focal deficit present.     Mental Status: She is alert and oriented to person, place, and time.     Cranial Nerves: No cranial nerve deficit.     Motor: Weakness present.  Psychiatric:        Mood and Affect: Mood normal.        Behavior: Behavior normal.        Thought Content: Thought content normal.        Judgment: Judgment normal.    Assessment & Plan:   Problem List Items Addressed This Visit       Cardiovascular and Mediastinum   Hypertension - Primary   Relevant Orders   CBC with Differential/Platelet (Completed)   Comp. Metabolic Panel (12) (Completed)   Orthostatic hypotension     Genitourinary   Stage 3a chronic kidney disease (HCC)     Other   Dizziness   History of stroke   Relevant Orders   Ambulatory referral to Neurology   Chronic left shoulder pain   Relevant Medications   oxyCODONE (OXY IR/ROXICODONE) 5 MG immediate release tablet   cyclobenzaprine (FLEXERIL) 10 MG tablet   Other Relevant Orders   Ambulatory  referral to Orthopedic Surgery   Depression   Relevant Orders   Ambulatory referral to Psychiatry   Glaucoma of both eyes   Relevant Orders   Ambulatory referral to Ophthalmology   Other Visit Diagnoses     Colon cancer screening       Relevant Orders   Ambulatory referral to Gastroenterology       Outpatient Encounter Medications as of 03/22/2021  Medication Sig   acetaminophen (TYLENOL) 325 MG tablet Take 650 mg by mouth every 6 (six) hours as needed for mild pain.   albuterol (VENTOLIN HFA) 108 (90 Base) MCG/ACT inhaler Inhale 2 puffs into the lungs every 2 (two) hours as needed for wheezing or shortness of breath.   amitriptyline (ELAVIL) 25 MG tablet Take 25 mg by mouth daily at 6 (six) AM.   Artificial Tear Solution (GENTEAL TEARS OP) Place 1 drop into both eyes daily as needed (dry eyes).   aspirin EC 81 MG tablet Take 81 mg by mouth daily. Swallow whole.   atorvastatin (LIPITOR) 80 MG tablet Take 80 mg by mouth at bedtime.   baclofen (LIORESAL) 10 MG tablet Take 10 mg by mouth 4 (four) times daily.   brimonidine (ALPHAGAN) 0.2 % ophthalmic solution Place 1 drop into both eyes 2 (two) times daily.   carvedilol (COREG) 12.5 MG tablet Take 1 tablet (12.5 mg total) by mouth 2 (two) times daily.   diclofenac Sodium (VOLTAREN) 1 % GEL Apply 1 application topically 4 (four) times daily as needed (pain).   dorzolamide-timolol (COSOPT) 22.3-6.8 MG/ML ophthalmic solution Place 1 drop into both eyes 2 (two) times daily.   DULoxetine (CYMBALTA) 60 MG capsule Take 60 mg by mouth daily.   escitalopram (LEXAPRO) 5 MG tablet Take 5 mg by mouth daily.   lamoTRIgine (LAMICTAL) 25 MG tablet Take 25 mg by mouth daily.   latanoprost (XALATAN) 0.005 % ophthalmic solution Place 1 drop into both eyes at bedtime.   meclizine (ANTIVERT) 25 MG tablet Take 1 tablet (25 mg total) by mouth 3 (three) times daily as needed for  dizziness.   Netarsudil Dimesylate 0.02 % SOLN Place 1 drop into both eyes at  bedtime.   [DISCONTINUED] baclofen (LIORESAL) 10 MG tablet Take 1 tablet by mouth 2 (two) times daily.   [DISCONTINUED] cyclobenzaprine (FLEXERIL) 10 MG tablet Take 1 tablet (10 mg total) by mouth 3 (three) times daily as needed for muscle spasms.   cyclobenzaprine (FLEXERIL) 10 MG tablet Take 1 tablet (10 mg total) by mouth 3 (three) times daily as needed for muscle spasms.   oxyCODONE (OXY IR/ROXICODONE) 5 MG immediate release tablet Take 1 tablet (5 mg total) by mouth every 6 (six) hours as needed for up to 5 days.   [DISCONTINUED] oxyCODONE (OXY IR/ROXICODONE) 5 MG immediate release tablet Take 5 mg by mouth every 6 (six) hours as needed for pain. (Patient not taking: Reported on 03/22/2021)   No facility-administered encounter medications on file as of 03/22/2021.  1. Primary hypertension Patient encouraged to continue checking blood pressure at home, keep a written log and have available for all office visits.  Patient to follow-up with this provider in 2 weeks.  [facility]  Red flags given for prompt reevaluation - CBC with Differential/Platelet - Comp. Metabolic Panel (12)  2. Orthostatic hypotension Currently resolved  3. Dizziness Currently resolved  4. History of stroke  - Ambulatory referral to Neurology  5. Chronic left shoulder pain Stop baclofen, trial Flexeril, continue oxycodone every 6 hours as needed, check of [place] controlled substance registry within normal limits. - Ambulatory referral to Orthopedic Surgery - oxyCODONE (OXY IR/ROXICODONE) 5 MG immediate release tablet; Take 1 tablet (5 mg total) by mouth every 6 (six) hours as needed for up to 5 days.  Dispense: 20 tablet; Refill: 0 - cyclobenzaprine (FLEXERIL) 10 MG tablet; Take 1 tablet (10 mg total) by mouth 3 (three) times daily as needed for muscle spasms.  Dispense: 30 tablet; Refill: 0  6. Depression, unspecified depression  type Refer to psychiatry for medication management, no refills needed today - Ambulatory referral to Psychiatry  7. Stage 3a chronic kidney disease (HCC)   8. Glaucoma of both eyes, unspecified glaucoma type  - Ambulatory referral to Ophthalmology  9. Colon cancer screening  - Ambulatory referral to Gastroenterology   I have reviewed the patient's medical history (PMH, PSH, Social History, Family History, Medications, and allergies) , and have been updated if relevant. I spent 32 minutes reviewing chart and  face to face time with patient.    Follow-up: Return in about 2 weeks (around 04/05/2021).   Kasandra Knudsen Mayers, PA-C

## 2021-03-23 ENCOUNTER — Emergency Department (HOSPITAL_COMMUNITY)
Admission: EM | Admit: 2021-03-23 | Discharge: 2021-03-24 | Disposition: A | Discharge status: Home / Self Care | Payer: Medicaid Other | Attending: Emergency Medicine | Admitting: Emergency Medicine

## 2021-03-23 ENCOUNTER — Emergency Department (HOSPITAL_COMMUNITY): Payer: Medicaid Other

## 2021-03-23 ENCOUNTER — Other Ambulatory Visit: Payer: Self-pay

## 2021-03-23 DIAGNOSIS — H409 Unspecified glaucoma: Secondary | ICD-10-CM | POA: Insufficient documentation

## 2021-03-23 DIAGNOSIS — R200 Anesthesia of skin: Secondary | ICD-10-CM | POA: Diagnosis not present

## 2021-03-23 DIAGNOSIS — I129 Hypertensive chronic kidney disease with stage 1 through stage 4 chronic kidney disease, or unspecified chronic kidney disease: Secondary | ICD-10-CM | POA: Insufficient documentation

## 2021-03-23 DIAGNOSIS — Z79899 Other long term (current) drug therapy: Secondary | ICD-10-CM | POA: Diagnosis not present

## 2021-03-23 DIAGNOSIS — Z7982 Long term (current) use of aspirin: Secondary | ICD-10-CM | POA: Diagnosis not present

## 2021-03-23 DIAGNOSIS — G8929 Other chronic pain: Secondary | ICD-10-CM | POA: Insufficient documentation

## 2021-03-23 DIAGNOSIS — Z87891 Personal history of nicotine dependence: Secondary | ICD-10-CM | POA: Diagnosis not present

## 2021-03-23 DIAGNOSIS — N1831 Chronic kidney disease, stage 3a: Secondary | ICD-10-CM | POA: Insufficient documentation

## 2021-03-23 DIAGNOSIS — Z8616 Personal history of COVID-19: Secondary | ICD-10-CM | POA: Diagnosis not present

## 2021-03-23 DIAGNOSIS — R000 Tachycardia, unspecified: Secondary | ICD-10-CM | POA: Insufficient documentation

## 2021-03-23 DIAGNOSIS — I160 Hypertensive urgency: Secondary | ICD-10-CM

## 2021-03-23 DIAGNOSIS — M25512 Pain in left shoulder: Secondary | ICD-10-CM | POA: Insufficient documentation

## 2021-03-23 DIAGNOSIS — F32A Depression, unspecified: Secondary | ICD-10-CM | POA: Insufficient documentation

## 2021-03-23 DIAGNOSIS — Z8673 Personal history of transient ischemic attack (TIA), and cerebral infarction without residual deficits: Secondary | ICD-10-CM | POA: Insufficient documentation

## 2021-03-23 DIAGNOSIS — R519 Headache, unspecified: Secondary | ICD-10-CM | POA: Diagnosis present

## 2021-03-23 LAB — COMPREHENSIVE METABOLIC PANEL
ALT: 25 U/L (ref 0–44)
AST: 21 U/L (ref 15–41)
Albumin: 3.7 g/dL (ref 3.5–5.0)
Alkaline Phosphatase: 81 U/L (ref 38–126)
Anion gap: 8 (ref 5–15)
BUN: 6 mg/dL (ref 6–20)
CO2: 31 mmol/L (ref 22–32)
Calcium: 9.4 mg/dL (ref 8.9–10.3)
Chloride: 101 mmol/L (ref 98–111)
Creatinine, Ser: 0.88 mg/dL (ref 0.44–1.00)
GFR, Estimated: >60 mL/min (ref >60)
Glucose, Bld: 96 mg/dL (ref 70–99)
Potassium: 3 mmol/L — ABNORMAL LOW (ref 3.5–5.1)
Sodium: 140 mmol/L (ref 135–145)
Total Bilirubin: 0.8 mg/dL (ref 0.3–1.2)
Total Protein: 7.3 g/dL (ref 6.5–8.1)

## 2021-03-23 LAB — CBC WITH DIFFERENTIAL/PLATELET
Abs Immature Granulocytes: 0.04 10*3/uL (ref 0.00–0.07)
Basophils Absolute: 0.1 10*3/uL (ref 0.0–0.2)
Basophils Absolute: 0.1 10*3/uL (ref 0.0–0.1)
Basophils Relative: 1 %
Basos: 1 %
EOS (ABSOLUTE): 0.2 10*3/uL (ref 0.0–0.4)
Eos: 3 %
Eosinophils Absolute: 0.2 10*3/uL (ref 0.0–0.5)
Eosinophils Relative: 2 %
HCT: 41.5 % (ref 36.0–46.0)
Hematocrit: 38.2 % (ref 34.0–46.6)
Hemoglobin: 12.5 g/dL (ref 11.1–15.9)
Hemoglobin: 13.5 g/dL (ref 12.0–15.0)
Immature Grans (Abs): 0 10*3/uL (ref 0.0–0.1)
Immature Granulocytes: 1 %
Immature Granulocytes: 1 %
Lymphocytes Absolute: 2 10*3/uL (ref 0.7–3.1)
Lymphocytes Relative: 24 %
Lymphs Abs: 2 10*3/uL (ref 0.7–4.0)
Lymphs: 27 %
MCH: 28.7 pg (ref 26.6–33.0)
MCH: 29.5 pg (ref 26.0–34.0)
MCHC: 32.7 g/dL (ref 31.5–35.7)
MCHC: 32.5 g/dL (ref 30.0–36.0)
MCV: 88 fL (ref 79–97)
MCV: 90.6 fL (ref 80.0–100.0)
Monocytes Absolute: 0.5 10*3/uL (ref 0.1–0.9)
Monocytes Absolute: 0.5 10*3/uL (ref 0.1–1.0)
Monocytes Relative: 6 %
Monocytes: 7 %
Neutro Abs: 5.6 10*3/uL (ref 1.7–7.7)
Neutrophils Absolute: 4.6 10*3/uL (ref 1.4–7.0)
Neutrophils Relative %: 66 %
Neutrophils: 61 %
Platelets: 430 10*3/uL (ref 150–450)
Platelets: 443 10*3/uL — ABNORMAL HIGH (ref 150–400)
RBC: 4.35 x10E6/uL (ref 3.77–5.28)
RBC: 4.58 MIL/uL (ref 3.87–5.11)
RDW: 13.5 % (ref 11.7–15.4)
RDW: 14.6 % (ref 11.5–15.5)
WBC: 7.5 10*3/uL (ref 3.4–10.8)
WBC: 8.4 10*3/uL (ref 4.0–10.5)
nRBC: 0 % (ref 0.0–0.2)

## 2021-03-23 LAB — COMP. METABOLIC PANEL (12)
AST: 16 IU/L (ref 0–40)
Albumin/Globulin Ratio: 1.6 (ref 1.2–2.2)
Albumin: 4.4 g/dL (ref 3.8–4.9)
Alkaline Phosphatase: 91 IU/L (ref 44–121)
BUN/Creatinine Ratio: 10 (ref 9–23)
BUN: 8 mg/dL (ref 6–24)
Bilirubin Total: 0.2 mg/dL (ref 0.0–1.2)
Calcium: 9.6 mg/dL (ref 8.7–10.2)
Chloride: 104 mmol/L (ref 96–106)
Creatinine, Ser: 0.84 mg/dL (ref 0.57–1.00)
Globulin, Total: 2.7 g/dL (ref 1.5–4.5)
Glucose: 85 mg/dL (ref 70–99)
Potassium: 3.6 mmol/L (ref 3.5–5.2)
Sodium: 146 mmol/L — ABNORMAL HIGH (ref 134–144)
Total Protein: 7.1 g/dL (ref 6.0–8.5)
eGFR: 82 mL/min/{1.73_m2} (ref >59)

## 2021-03-23 LAB — URINALYSIS, ROUTINE W REFLEX MICROSCOPIC
Bilirubin Urine: NEGATIVE
Glucose, UA: NEGATIVE mg/dL
Hgb urine dipstick: NEGATIVE
Ketones, ur: NEGATIVE mg/dL
Leukocytes,Ua: NEGATIVE
Nitrite: NEGATIVE
Protein, ur: NEGATIVE mg/dL
Specific Gravity, Urine: 1.008 (ref 1.005–1.030)
pH: 9 — ABNORMAL HIGH (ref 5.0–8.0)

## 2021-03-23 LAB — CBG MONITORING, ED: Glucose-Capillary: 93 mg/dL (ref 70–99)

## 2021-03-23 MED ORDER — LISINOPRIL 10 MG PO TABS
10.0000 mg | ORAL_TABLET | Freq: Once | ORAL | Status: AC
  Administered 2021-03-23: 10 mg via ORAL
  Filled 2021-03-23: qty 1

## 2021-03-23 MED ORDER — LORAZEPAM 2 MG/ML IJ SOLN
0.5000 mg | Freq: Once | INTRAMUSCULAR | Status: AC
  Administered 2021-03-23: 0.5 mg via INTRAVENOUS
  Filled 2021-03-23: qty 1

## 2021-03-23 MED ORDER — SODIUM CHLORIDE 0.9 % IV BOLUS
1000.0000 mL | Freq: Once | INTRAVENOUS | Status: AC
  Administered 2021-03-23: 1000 mL via INTRAVENOUS

## 2021-03-23 MED ORDER — HYDRALAZINE HCL 20 MG/ML IJ SOLN
10.0000 mg | INTRAMUSCULAR | Status: AC
  Administered 2021-03-23: 10 mg via INTRAVENOUS
  Filled 2021-03-23: qty 1

## 2021-03-23 MED ORDER — LABETALOL HCL 5 MG/ML IV SOLN
20.0000 mg | Freq: Once | INTRAVENOUS | Status: AC
  Administered 2021-03-23: 20 mg via INTRAVENOUS
  Filled 2021-03-23: qty 4

## 2021-03-23 NOTE — ED Notes (Signed)
Taxi called for patient.

## 2021-03-23 NOTE — ED Provider Notes (Signed)
[facility] Provider Note   CSN: [id number] Arrival date & time: 03/23/21  2001     History Chief Complaint  Patient presents with   Headache   Weakness   Hypertension    [facility] is a 55 y.o. female.  HPI Presents with concern of ongoing numbness on her left side, anxiousness about her hypertension. She notes that she has been seen and evaluated both in our emergency department at at an area [facility] recently.  She has been admitted, evaluated for possible stroke. Her history is notable for hypertension, stroke, ongoing left-sided deficits. She notes no new weakness in her left side, but possibly has more shakiness. No confusion, disorientation, fall.  After recent discharge, since she had several medications stopped due to some concern about them precipitating dizziness, she notes that she has had persistently higher blood pressure than prior.  History is obtained by the patient, EMS individuals, and chart review, including d brief summation of hospitalization as below:  [facility] is an 55 y.o. female with medical history significant for CVA with left-sided spastic hemiparesis followed by neurology at [facility] health, muscle spasm of left lower extremity, nontraumatic subcortical hemorrhage of right cerebral hemisphere, muscle spasticity, history of hypokalemia, coronary artery disease with history of MI in 2017, essential hypertension, hyperlipidemia, chronic anxiety/depression, who presented to [facility] due to worsening left-sided weakness and paresthesia.  She was admitted to hospitalist service with concern of possible stroke.  However MRI brain negative for acute CVA, UA negative.  Lab studies unremarkable.    Past Medical History:  Diagnosis Date   Hypertension    Pancreatitis    Stage 3a chronic kidney disease (HCC)    Stroke [facility])     Patient Active Problem List   Diagnosis Date Noted   History of stroke 03/23/2021   Chronic left  shoulder pain 03/23/2021   Depression 03/23/2021   Glaucoma of both eyes 03/23/2021   Dizziness 03/13/2021   Orthostatic hypotension 03/13/2021   Weakness 03/05/2021   COVID-19 07/14/2020   Stroke (HCC)    Stage 3a chronic kidney disease (HCC)    Hypertension     Past Surgical History:  Procedure Laterality Date   TUBAL LIGATION       OB History   No obstetric history on file.     No family history on file.  Social History   Tobacco Use   Smoking status: Former   Smokeless tobacco: Never  Substance Use Topics   Alcohol use: Never   Drug use: Never    Home Medications Prior to Admission medications   Medication Sig Start Date End Date Taking? Authorizing Provider  acetaminophen (TYLENOL) 325 MG tablet Take 650 mg by mouth every 6 (six) hours as needed for mild pain.    [provider]  albuterol (VENTOLIN HFA) 108 (90 Base) MCG/ACT inhaler Inhale 2 puffs into the lungs every 2 (two) hours as needed for wheezing or shortness of breath. 07/28/20   Ghimire, Werner Lean, MD  amitriptyline (ELAVIL) 25 MG tablet Take 25 mg by mouth daily at 6 (six) AM. 02/07/21 02/07/22  [provider]  Artificial Tear Solution (GENTEAL TEARS OP) Place 1 drop into both eyes daily as needed (dry eyes).    [provider]  aspirin EC 81 MG tablet Take 81 mg by mouth daily. Swallow whole.    [provider]  atorvastatin (LIPITOR) 80 MG tablet Take 80 mg by mouth at bedtime. 02/07/21 02/07/22  [provider]  baclofen (LIORESAL) 10 MG tablet Take 10 mg by mouth 4 (four) times daily. 01/28/21   [provider]  brimonidine (ALPHAGAN) 0.2 % ophthalmic solution Place 1 drop into both eyes 2 (two) times daily. 06/26/20   [provider]  carvedilol (COREG) 12.5 MG tablet Take 1 tablet (12.5 mg total) by mouth 2 (two) times daily. 03/13/21 04/12/21  Shirley Closs, MD  cyclobenzaprine (FLEXERIL) 10 MG tablet Take 1 tablet (10 mg total) by mouth 3  (three) times daily as needed for muscle spasms. 03/22/21   Mayers, Shirley S, PA-C  diclofenac Sodium (VOLTAREN) 1 % GEL Apply 1 application topically 4 (four) times daily as needed (pain). 05/20/20   [provider]  dorzolamide-timolol (COSOPT) 22.3-6.8 MG/ML ophthalmic solution Place 1 drop into both eyes 2 (two) times daily. 05/31/20   [provider]  DULoxetine (CYMBALTA) 60 MG capsule Take 60 mg by mouth daily. 02/07/21 02/07/22  [provider]  escitalopram (LEXAPRO) 5 MG tablet Take 5 mg by mouth daily. 01/28/21   [provider]  lamoTRIgine (LAMICTAL) 25 MG tablet Take 25 mg by mouth daily. 02/08/21   [provider]  latanoprost (XALATAN) 0.005 % ophthalmic solution Place 1 drop into both eyes at bedtime. 06/17/20   [provider]  meclizine (ANTIVERT) 25 MG tablet Take 1 tablet (25 mg total) by mouth 3 (three) times daily as needed for dizziness. 02/22/21   Linwood Dibbles, MD  Netarsudil Dimesylate 0.02 % SOLN Place 1 drop into both eyes at bedtime. 02/07/21   [provider]  oxyCODONE (OXY IR/ROXICODONE) 5 MG immediate release tablet Take 1 tablet (5 mg total) by mouth every 6 (six) hours as needed for up to 5 days. 03/22/21 03/27/21  Mayers, Shirley Knudsen, PA-C    Allergies    Patient has no known allergies.  Review of Systems   Review of Systems  Physical Exam Updated Vital Signs BP (!) 161/111   Pulse (!) 112   Temp 98.2 F (36.8 C) (Oral)   Resp (!) 21   Ht 5\' 4"  (1.626 m)   Wt 85.3 kg   SpO2 99%   BMI 32.27 kg/m   Physical Exam Vitals and nursing note reviewed.  Constitutional:      General: She is not in acute distress.    Appearance: She is well-developed.  HENT:     Head: Normocephalic and atraumatic.  Eyes:     Conjunctiva/sclera: Conjunctivae normal.  Cardiovascular:     Rate and Rhythm: Normal rate and regular rhythm.  Pulmonary:     Effort: Pulmonary effort is normal. No respiratory distress.     Breath  sounds: Normal breath sounds. No stridor.  Abdominal:     General: There is no distension.  Skin:    General: Skin is warm and dry.  Neurological:     Mental Status: She is alert and oriented to person, place, and time.     Cranial Nerves: No cranial nerve deficit.     Comments: Patient describes decree sensation left arm, moves all extremities spontaneously, has no cranial nerve abnormalities, has clear speech, is oriented appropriately.  Psychiatric:        Mood and Affect: Mood is anxious.    ED Results / Procedures / Treatments   Labs (all labs ordered are listed, but only abnormal results are displayed) Labs Reviewed  COMPREHENSIVE METABOLIC PANEL - Abnormal; Notable for the following components:      Result Value  Potassium 3.0 (*)    All other components within normal limits  CBC WITH DIFFERENTIAL/PLATELET - Abnormal; Notable for the following components:   Platelets 443 (*)    All other components within normal limits  URINALYSIS, ROUTINE W REFLEX MICROSCOPIC - Abnormal; Notable for the following components:   Color, Urine STRAW (*)    pH 9.0 (*)    All other components within normal limits  CBG MONITORING, ED    EKG EKG Interpretation  Date/Time:  Wednesday March 23 2021 20:08:42 EDT Ventricular Rate:  112 PR Interval:  156 QRS Duration: 132 QT Interval:  357 QTC Calculation: 488 R Axis:   245 Text Interpretation: Sinus tachycardia RBBB and LAFB Abnormal ECG Confirmed by Gerhard Munch [phone]) on 03/23/2021 11:26:32 PM  Radiology CT Head Wo Contrast  Result Date: 03/23/2021 CLINICAL DATA:  Neuro deficit, acute, stroke suspected. Mental status change, unknown cause. Hx of CVA w "worse" L side deficits and new hypertensive urgency. Headache. EXAM: CT HEAD WITHOUT CONTRAST TECHNIQUE: Contiguous axial images were obtained from the base of the skull through the vertex without intravenous contrast. COMPARISON:  03/05/2021 FINDINGS: Brain: Normal anatomic  configuration. Parenchymal volume loss is commensurate with the patient'[facility] age. Mild periventricular white matter changes are present likely reflecting the sequela of small vessel ischemia. Stable remote infarcts within the corona radiata bilaterally, thalami bilaterally, and left pons. No abnormal intra or extra-axial mass lesion or fluid collection. No abnormal mass effect or midline shift. No evidence of acute intracranial hemorrhage or infarct. Ventricular size is normal. Cerebellum unremarkable. Vascular: No asymmetric hyperdense vasculature at the skull base. Skull: Intact Sinuses/Orbits: Paranasal sinuses are clear. Orbits are unremarkable. Other: Mastoid air cells and middle ear cavities are clear. IMPRESSION: No acute intracranial abnormality. Stable senescent changes. Stable remote infarcts as outlined above. Electronically Signed   By: Helyn Numbers M.D.   On: 03/23/2021 22:37    Procedures Procedures   Medications Ordered in ED Medications  sodium chloride 0.9 % bolus 1,000 mL (has no administration in time range)  labetalol (NORMODYNE) injection 20 mg (has no administration in time range)  LORazepam (ATIVAN) injection 0.5 mg (has no administration in time range)  hydrALAZINE (APRESOLINE) injection 10 mg (10 mg Intravenous Given 03/23/21 2102)  lisinopril (ZESTRIL) tablet 10 mg (10 mg Oral Given 03/23/21 2101)    ED Course  I have reviewed the triage vital signs and the nursing notes.  Pertinent labs & imaging results that were available during my care of the patient were reviewed by me and considered in my medical decision making (see chart for details).   11:26 PM Patient notes that she is feeling somewhat better.  She has mild tachycardia, though this is consistent with vital signs on recent discharge.  Has mild hypertension, but no evidence for new endorgan damage, with CT unrevealing.  She has had multiple recent CT, MRI studies, without evidence for acute new stroke, and today  with no description of true new neurodeficit, reassuring physical exam, no indication for advanced imaging such as MRI. With mild hypertension, mild tachycardia, these may be secondary to recent change in blood pressure medications, but absent distress, after provision of additional medications here, patient is appropriate for discharge with outpatient follow-up, to which she is amenable.  Final Clinical Impression([facility]) / ED Diagnoses Final diagnoses:  Hypertensive urgency     Gerhard Munch, MD 03/23/21 2340

## 2021-03-23 NOTE — ED Notes (Signed)
Pt called out to state "I am feeling funny" - On assessment pt states to this RN that she feels numbness on her left side and burning. Pt tearful. Asking this RN to stay in room with her. This RN noted no new neuro deficits on assessment. PT informed that we are monitoring her. MD notified.

## 2021-03-23 NOTE — ED Notes (Signed)
Pt states that she will be able to take a cab home and get into her house.

## 2021-03-23 NOTE — Discharge Instructions (Signed)
As discussed, your evaluation today has been largely reassuring.  But, it is important that you monitor your condition carefully, and do not hesitate to return to the ED if you develop new, or concerning changes in your condition.  Otherwise, please follow-up with your physician for appropriate ongoing care.  Is very important that you speak about your blood pressure medication regimen given your recent changes.

## 2021-03-23 NOTE — ED Triage Notes (Signed)
Pt BIB EMS from home. EMS was called out for stroke. Pt has  CC of headache, HTN, and abnormal sensations. Per EMS pt has hx of stroke  in 2020 - EMS gave pt LVO score of 1 but no deficits noticed today.  Per pt she has left sided deficits from stroke and walks with walker.  Per EMS pt has hx of HTN but was taken off a lot of her meds recently.  VS with EMS  156/80 HR 110 CBG 112 20RAC

## 2021-03-23 NOTE — ED Notes (Signed)
MD Jeraldine Loots notified of pt BP

## 2021-03-24 ENCOUNTER — Telehealth: Payer: Self-pay | Admitting: *Deleted

## 2021-03-24 ENCOUNTER — Encounter (HOSPITAL_COMMUNITY): Payer: Self-pay | Admitting: Radiology

## 2021-03-24 NOTE — Telephone Encounter (Signed)
-----   Message from Roney Jaffe, [place] sent at 03/23/2021  9:15 AM EDT ----- Please call patient and let her know that her kidney and liver function are within normal limits.  Her sodium level is slightly elevated, she needs to increase her hydration at this time.  She does not show signs of anemia.

## 2021-03-24 NOTE — Telephone Encounter (Signed)
Patient is aware of labs being normal except for elevated sodium that should be addressed with increase of water.

## 2021-03-24 NOTE — ED Notes (Signed)
Patient verbalizes understanding of discharge instructions. Opportunity for questioning and answers were provided. Armband removed by staff, pt discharged from ED via wheelchair. Pt educated to wait on taxi outside

## 2021-03-25 ENCOUNTER — Other Ambulatory Visit: Payer: Self-pay

## 2021-03-25 ENCOUNTER — Emergency Department (HOSPITAL_COMMUNITY)
Admission: EM | Admit: 2021-03-25 | Discharge: 2021-03-26 | Disposition: A | Discharge status: Home / Self Care | Payer: Medicaid Other | Attending: Emergency Medicine | Admitting: Emergency Medicine

## 2021-03-25 ENCOUNTER — Encounter (HOSPITAL_COMMUNITY): Payer: Self-pay | Admitting: *Deleted

## 2021-03-25 DIAGNOSIS — Z7982 Long term (current) use of aspirin: Secondary | ICD-10-CM | POA: Diagnosis not present

## 2021-03-25 DIAGNOSIS — I10 Essential (primary) hypertension: Secondary | ICD-10-CM

## 2021-03-25 DIAGNOSIS — N1831 Chronic kidney disease, stage 3a: Secondary | ICD-10-CM | POA: Insufficient documentation

## 2021-03-25 DIAGNOSIS — Z8616 Personal history of COVID-19: Secondary | ICD-10-CM | POA: Diagnosis not present

## 2021-03-25 DIAGNOSIS — G44209 Tension-type headache, unspecified, not intractable: Secondary | ICD-10-CM | POA: Diagnosis not present

## 2021-03-25 DIAGNOSIS — I452 Bifascicular block: Secondary | ICD-10-CM | POA: Diagnosis not present

## 2021-03-25 DIAGNOSIS — I129 Hypertensive chronic kidney disease with stage 1 through stage 4 chronic kidney disease, or unspecified chronic kidney disease: Secondary | ICD-10-CM | POA: Diagnosis not present

## 2021-03-25 DIAGNOSIS — Z87891 Personal history of nicotine dependence: Secondary | ICD-10-CM | POA: Diagnosis not present

## 2021-03-25 DIAGNOSIS — I4510 Unspecified right bundle-branch block: Secondary | ICD-10-CM | POA: Diagnosis not present

## 2021-03-25 DIAGNOSIS — R519 Headache, unspecified: Secondary | ICD-10-CM | POA: Diagnosis present

## 2021-03-25 NOTE — ED Triage Notes (Signed)
The pt is c/o a headache for 2 hours ibu has not helped  her bp has been elevated also

## 2021-03-26 ENCOUNTER — Emergency Department (HOSPITAL_COMMUNITY): Payer: Medicaid Other

## 2021-03-26 LAB — CBC
HCT: 39.1 % (ref 36.0–46.0)
Hemoglobin: 12.6 g/dL (ref 12.0–15.0)
MCH: 29.2 pg (ref 26.0–34.0)
MCHC: 32.2 g/dL (ref 30.0–36.0)
MCV: 90.5 fL (ref 80.0–100.0)
Platelets: 428 10*3/uL — ABNORMAL HIGH (ref 150–400)
RBC: 4.32 MIL/uL (ref 3.87–5.11)
RDW: 14.5 % (ref 11.5–15.5)
WBC: 8.1 10*3/uL (ref 4.0–10.5)
nRBC: 0 % (ref 0.0–0.2)

## 2021-03-26 LAB — URINALYSIS, ROUTINE W REFLEX MICROSCOPIC
Bilirubin Urine: NEGATIVE
Glucose, UA: NEGATIVE mg/dL
Hgb urine dipstick: NEGATIVE
Ketones, ur: NEGATIVE mg/dL
Leukocytes,Ua: NEGATIVE
Nitrite: NEGATIVE
Protein, ur: NEGATIVE mg/dL
Specific Gravity, Urine: 1.017 (ref 1.005–1.030)
pH: 5 (ref 5.0–8.0)

## 2021-03-26 LAB — COMPREHENSIVE METABOLIC PANEL
ALT: 26 U/L (ref 0–44)
AST: 20 U/L (ref 15–41)
Albumin: 3.5 g/dL (ref 3.5–5.0)
Alkaline Phosphatase: 72 U/L (ref 38–126)
Anion gap: 10 (ref 5–15)
BUN: 10 mg/dL (ref 6–20)
CO2: 27 mmol/L (ref 22–32)
Calcium: 9.2 mg/dL (ref 8.9–10.3)
Chloride: 103 mmol/L (ref 98–111)
Creatinine, Ser: 0.94 mg/dL (ref 0.44–1.00)
GFR, Estimated: >60 mL/min (ref >60)
Glucose, Bld: 87 mg/dL (ref 70–99)
Potassium: 3.3 mmol/L — ABNORMAL LOW (ref 3.5–5.1)
Sodium: 140 mmol/L (ref 135–145)
Total Bilirubin: 0.6 mg/dL (ref 0.3–1.2)
Total Protein: 6.7 g/dL (ref 6.5–8.1)

## 2021-03-26 LAB — TROPONIN I (HIGH SENSITIVITY): Troponin I (High Sensitivity): 6 ng/L (ref <18)

## 2021-03-26 MED ORDER — CYCLOBENZAPRINE HCL 10 MG PO TABS
10.0000 mg | ORAL_TABLET | Freq: Once | ORAL | Status: AC
  Administered 2021-03-26: 10 mg via ORAL
  Filled 2021-03-26: qty 1

## 2021-03-26 MED ORDER — DIPHENHYDRAMINE HCL 50 MG/ML IJ SOLN
25.0000 mg | Freq: Once | INTRAMUSCULAR | Status: AC
  Administered 2021-03-26: 25 mg via INTRAVENOUS
  Filled 2021-03-26: qty 1

## 2021-03-26 MED ORDER — PROCHLORPERAZINE EDISYLATE 10 MG/2ML IJ SOLN
10.0000 mg | Freq: Once | INTRAMUSCULAR | Status: AC
  Administered 2021-03-26: 10 mg via INTRAVENOUS
  Filled 2021-03-26: qty 2

## 2021-03-26 NOTE — ED Provider Notes (Signed)
[facility] EMERGENCY DEPARTMENT Provider Note   CSN: [id number] Arrival date & time: 03/25/21  2246     History Chief Complaint  Patient presents with   Headache    [facility] is a 55 y.o. female presenting with headache and elevated blood pressure. Patient reports that she has been having a headache with some intermittent left eye blurriness (she also has cataracts). She feels a bit weaker in her left leg than usual. She also feels stiffness right now and feels that there is a "chest tightness" without symptoms of shortness of breath or chest pain. She does attribute the tightness to not being able to take her home flexeril last night.  She notes that since her BP medications were changed she has been having elevated blood pressures and prior to coming to the hospital, her home BP was 171/110.    PMH significant for CVA with left-sided spastic hemiparesis followed by neurology at [facility], muscle spasm of left lower extremity, nontraumatic subcortical hemorrhage of right cerebral hemisphere, muscle spasticity, history of hypokalemia, coronary artery disease with history of MI in 2017, essential hypertension, hyperlipidemia, chronic anxiety/depression.  Patient was hospitalized on 9/24-10/2 due to worsening left-sided weakness and paresthesia. MRI brain was negative for acute CVA and labs were unremarkable.     Past Medical History:  Diagnosis Date   Hypertension    Pancreatitis    Stage 3a chronic kidney disease (HCC)    Stroke [facility])     Patient Active Problem List   Diagnosis Date Noted   History of stroke 03/23/2021   Chronic left shoulder pain 03/23/2021   Depression 03/23/2021   Glaucoma of both eyes 03/23/2021   Dizziness 03/13/2021   Orthostatic hypotension 03/13/2021   Weakness 03/05/2021   COVID-19 07/14/2020   Stroke (HCC)    Stage 3a chronic kidney disease (HCC)    Hypertension     Past Surgical History:  Procedure Laterality Date    TUBAL LIGATION       OB History   No obstetric history on file.     No family history on file.  Social History   Tobacco Use   Smoking status: Former   Smokeless tobacco: Never  Substance Use Topics   Alcohol use: Never   Drug use: Never    Home Medications Prior to Admission medications   Medication Sig Start Date End Date Taking? Authorizing Provider  acetaminophen (TYLENOL) 325 MG tablet Take 650 mg by mouth every 6 (six) hours as needed for mild pain.    [provider]  albuterol (VENTOLIN HFA) 108 (90 Base) MCG/ACT inhaler Inhale 2 puffs into the lungs every 2 (two) hours as needed for wheezing or shortness of breath. 07/28/20   Ghimire, Werner Lean, MD  amitriptyline (ELAVIL) 25 MG tablet Take 25 mg by mouth daily at 6 (six) AM. 02/07/21 02/07/22  [provider]  Artificial Tear Solution (GENTEAL TEARS OP) Place 1 drop into both eyes daily as needed (dry eyes).    [provider]  aspirin EC 81 MG tablet Take 81 mg by mouth daily. Swallow whole.    [provider]  atorvastatin (LIPITOR) 80 MG tablet Take 80 mg by mouth at bedtime. 02/07/21 02/07/22  [provider]  baclofen (LIORESAL) 10 MG tablet Take 10 mg by mouth 4 (four) times daily. 01/28/21   [provider]  brimonidine (ALPHAGAN) 0.2 % ophthalmic solution Place 1 drop into both eyes 2 (two) times daily. 06/26/20  [provider]  carvedilol (COREG) 12.5 MG tablet Take 1 tablet (12.5 mg total) by mouth 2 (two) times daily. 03/13/21 04/12/21  Hughie Closs, MD  cyclobenzaprine (FLEXERIL) 10 MG tablet Take 1 tablet (10 mg total) by mouth 3 (three) times daily as needed for muscle spasms. 03/22/21   Mayers, Cari S, PA-C  diclofenac Sodium (VOLTAREN) 1 % GEL Apply 1 application topically 4 (four) times daily as needed (pain). 05/20/20   [provider]  dorzolamide-timolol (COSOPT) 22.3-6.8 MG/ML ophthalmic solution Place 1 drop into both eyes 2 (two) times  daily. 05/31/20   [provider]  DULoxetine (CYMBALTA) 60 MG capsule Take 60 mg by mouth daily. 02/07/21 02/07/22  [provider]  escitalopram (LEXAPRO) 5 MG tablet Take 5 mg by mouth daily. 01/28/21   [provider]  lamoTRIgine (LAMICTAL) 25 MG tablet Take 25 mg by mouth daily. 02/08/21   [provider]  latanoprost (XALATAN) 0.005 % ophthalmic solution Place 1 drop into both eyes at bedtime. 06/17/20   [provider]  meclizine (ANTIVERT) 25 MG tablet Take 1 tablet (25 mg total) by mouth 3 (three) times daily as needed for dizziness. 02/22/21   Linwood Dibbles, MD  Netarsudil Dimesylate 0.02 % SOLN Place 1 drop into both eyes at bedtime. 02/07/21   [provider]  oxyCODONE (OXY IR/ROXICODONE) 5 MG immediate release tablet Take 1 tablet (5 mg total) by mouth every 6 (six) hours as needed for up to 5 days. Patient taking differently: Take 5 mg by mouth every 6 (six) hours as needed for severe pain or moderate pain. 03/22/21 03/27/21  Mayers, Kasandra Knudsen, PA-C    Allergies    Patient has no known allergies.  Review of Systems   Review of Systems  Neurological:  Positive for headaches.   Physical Exam Updated Vital Signs BP (!) 162/105 (BP Location: Left Arm)   Pulse 89   Temp 97.9 F (36.6 C) (Oral)   Resp 11   Ht 5\' 4"  (1.626 m)   Wt 85.3 kg   SpO2 100%   BMI 32.28 kg/m   Physical Exam  ED Results / Procedures / Treatments   Labs (all labs ordered are listed, but only abnormal results are displayed) Labs Reviewed  COMPREHENSIVE METABOLIC PANEL - Abnormal; Notable for the following components:      Result Value   Potassium 3.3 (*)    All other components within normal limits  CBC - Abnormal; Notable for the following components:   Platelets 428 (*)    All other components within normal limits  URINALYSIS, ROUTINE W REFLEX MICROSCOPIC  TROPONIN I (HIGH SENSITIVITY)    EKG EKG Interpretation  Date/Time:  Friday March 25 2021 23:39:10 EDT Ventricular Rate:  97 PR Interval:  138 QRS Duration: 136 QT Interval:  376 QTC Calculation: 477 R Axis:   -54 Text Interpretation: Normal sinus rhythm Right bundle branch block Left anterior fascicular block  Bifascicular block  Abnormal ECG Confirmed by October 16 (Marianna Fuss) on 03/26/2021 8:33:53 AM  Radiology DG Chest 2 View  Result Date: 03/26/2021 CLINICAL DATA:  Chest tightness and bibasilar crackles EXAM: CHEST - 2 VIEW COMPARISON:  03/05/2021 FINDINGS: Persistent but improved, now patchy density at the right lung base. Lungs are otherwise clear. No pleural effusion. Stable cardiomediastinal contours. No acute osseous abnormality. IMPRESSION: Improved right basilar aeration with residual patchy opacity. Electronically Signed   By: 03/07/2021 M.D.   On: 03/26/2021 09:43   CT HEAD  WO CONTRAST ( )  Result Date: 03/26/2021 CLINICAL DATA:  Headache for 2 hours.  Elevated blood pressure. EXAM: CT HEAD WITHOUT CONTRAST TECHNIQUE: Contiguous axial images were obtained from the base of the skull through the vertex without intravenous contrast. COMPARISON:  CT head 03/23/2021 FINDINGS: Brain: No evidence of acute infarction, hemorrhage, hydrocephalus, extra-axial collection or mass lesion/mass effect. There are chronic changes related to right prior thalamic hemorrhage. Small old lacunar infarcts. Vascular: No hyperdense vessel. Skull: Normal. Negative for fracture or focal lesion. Sinuses/Orbits: No acute finding. Other: None. IMPRESSION: No acute intracranial abnormality. Electronically Signed   By: Emmaline Kluver M.D.   On: 03/26/2021 09:32    Procedures Procedures   Medications Ordered in ED Medications  diphenhydrAMINE (BENADRYL) injection 25 mg (25 mg Intravenous Given 03/26/21 0956)  prochlorperazine (COMPAZINE) injection 10 mg (10 mg Intravenous Given 03/26/21 1000)  cyclobenzaprine (FLEXERIL) tablet 10 mg (10 mg Oral Given 03/26/21 1000)    ED Course   I have reviewed the triage vital signs and the nursing notes.  Pertinent labs & imaging results that were available during my care of the patient were reviewed by me and considered in my medical decision making (see chart for details).    MDM Rules/Calculators/A&P                         Delanee Karge is a 55 y.o. female presenting with headache and elevated blood pressure. She has a significant PMH including CVA with left-sided spastic hemiparesis, muscle spasm of LLE, nontraumatic subcortical hemorrhage of right cerebral hemisphere, muscle spasticity,CAD with of MI in 2017, HTN, HLD, chronic anxiety/depression.   Patient presents with headache (worse than her usual) and elevated blood pressures. Also report chest tightness, which given her high risks and history of MI, cannot rule out cardiac origin at this time. EKG was obtained and was not changed from prior, Troponin was 6, CXR showed improved R basilar aeration with residual patchy opacity.  Patient reports HA with her elevated blood pressure as well as some vision change in her left eye. Patient has a history of CVA and subcortical hemorrhage, cannot rule out worsening of her conditions, though reassured that has remained at baseline mental status since arrival to the ED. Will obtain a CT head WO contrast. CT head was negative for acute intracranial abnormality. Patient is improved after administration of Compazine, Benadryl, Flexeril. Patient agreeable with discharge, discussed return precautions.    Final Clinical Impression(s) / ED Diagnoses Final diagnoses:  Acute non intractable tension-type headache  Hypertension, unspecified type     Evelena Leyden, DO 03/26/21 1154    Milagros Loll, MD 03/27/21 [phone]

## 2021-03-26 NOTE — Discharge Instructions (Signed)
You came in today for your headache and were given Compazine and Benadryl with improvement. The scan of your head today did not show any concerning changes. Your labs were negative for any heart issues. You were also given your Flexeril while you were here, so make sure to not take your next dose before 8 hours is completed.

## 2021-03-26 NOTE — ED Notes (Signed)
Patient transported to CT 

## 2021-03-29 ENCOUNTER — Ambulatory Visit: Payer: Medicaid Other | Admitting: Orthopaedic Surgery

## 2021-03-29 ENCOUNTER — Telehealth: Payer: Self-pay | Admitting: Family Medicine

## 2021-03-29 NOTE — Telephone Encounter (Unsigned)
Copied from CRM [id number]. Topic: General - Other >> Mar 29, 2021 12:31 PM Gaetana Michaelis A wrote: Reason for CRM: Dois Davenport with [facility] has called requesting a confirmation that the practice has received a 3051 form for the patient   Dois Davenport would also like to discuss the request for additional office notes from the patient's 03/22/21 visit  Please contact Dois Davenport at the direct number provided

## 2021-04-01 NOTE — Telephone Encounter (Signed)
Patient called back about status of forms. I let her know this is still being worked on.

## 2021-04-01 NOTE — Telephone Encounter (Signed)
[facility] from [facility] was called back and voice mail left to give our office a call back

## 2021-04-01 NOTE — Telephone Encounter (Signed)
Dois Davenport from [facility] phone # [phone]  checking on the status of message below. Caller would like a follow up call today stating unable to proceed with home health services until forms are received back. Please fax forms back to both fax # [phone] and [phone]. Caller would like a follow up call

## 2021-04-04 ENCOUNTER — Other Ambulatory Visit: Payer: Self-pay

## 2021-04-04 ENCOUNTER — Emergency Department (HOSPITAL_COMMUNITY)
Admission: EM | Admit: 2021-04-04 | Discharge: 2021-04-05 | Disposition: A | Discharge status: Home / Self Care | Payer: Medicaid Other | Attending: Emergency Medicine | Admitting: Emergency Medicine

## 2021-04-04 ENCOUNTER — Encounter (HOSPITAL_COMMUNITY): Payer: Self-pay | Admitting: *Deleted

## 2021-04-04 DIAGNOSIS — Z87891 Personal history of nicotine dependence: Secondary | ICD-10-CM | POA: Diagnosis not present

## 2021-04-04 DIAGNOSIS — R251 Tremor, unspecified: Secondary | ICD-10-CM | POA: Diagnosis present

## 2021-04-04 DIAGNOSIS — Z8616 Personal history of COVID-19: Secondary | ICD-10-CM | POA: Diagnosis not present

## 2021-04-04 DIAGNOSIS — R200 Anesthesia of skin: Secondary | ICD-10-CM | POA: Diagnosis not present

## 2021-04-04 DIAGNOSIS — I129 Hypertensive chronic kidney disease with stage 1 through stage 4 chronic kidney disease, or unspecified chronic kidney disease: Secondary | ICD-10-CM | POA: Diagnosis not present

## 2021-04-04 DIAGNOSIS — R42 Dizziness and giddiness: Secondary | ICD-10-CM | POA: Diagnosis not present

## 2021-04-04 DIAGNOSIS — N1831 Chronic kidney disease, stage 3a: Secondary | ICD-10-CM | POA: Diagnosis not present

## 2021-04-04 DIAGNOSIS — Z7982 Long term (current) use of aspirin: Secondary | ICD-10-CM | POA: Insufficient documentation

## 2021-04-04 DIAGNOSIS — E669 Obesity, unspecified: Secondary | ICD-10-CM | POA: Diagnosis not present

## 2021-04-04 DIAGNOSIS — M6281 Muscle weakness (generalized): Secondary | ICD-10-CM | POA: Insufficient documentation

## 2021-04-04 LAB — BASIC METABOLIC PANEL
Anion gap: 9 (ref 5–15)
BUN: 10 mg/dL (ref 6–20)
CO2: 28 mmol/L (ref 22–32)
Calcium: 9 mg/dL (ref 8.9–10.3)
Chloride: 102 mmol/L (ref 98–111)
Creatinine, Ser: 0.86 mg/dL (ref 0.44–1.00)
GFR, Estimated: >60 mL/min (ref >60)
Glucose, Bld: 87 mg/dL (ref 70–99)
Potassium: 3.3 mmol/L — ABNORMAL LOW (ref 3.5–5.1)
Sodium: 139 mmol/L (ref 135–145)

## 2021-04-04 LAB — CBC WITH DIFFERENTIAL/PLATELET
Abs Immature Granulocytes: 0.03 10*3/uL (ref 0.00–0.07)
Basophils Absolute: 0.1 10*3/uL (ref 0.0–0.1)
Basophils Relative: 1 %
Eosinophils Absolute: 0.2 10*3/uL (ref 0.0–0.5)
Eosinophils Relative: 2 %
HCT: 40.6 % (ref 36.0–46.0)
Hemoglobin: 13 g/dL (ref 12.0–15.0)
Immature Granulocytes: 0 %
Lymphocytes Relative: 28 %
Lymphs Abs: 2.2 10*3/uL (ref 0.7–4.0)
MCH: 29.1 pg (ref 26.0–34.0)
MCHC: 32 g/dL (ref 30.0–36.0)
MCV: 91 fL (ref 80.0–100.0)
Monocytes Absolute: 0.6 10*3/uL (ref 0.1–1.0)
Monocytes Relative: 7 %
Neutro Abs: 4.9 10*3/uL (ref 1.7–7.7)
Neutrophils Relative %: 62 %
Platelets: 370 10*3/uL (ref 150–400)
RBC: 4.46 MIL/uL (ref 3.87–5.11)
RDW: 14.4 % (ref 11.5–15.5)
WBC: 8 10*3/uL (ref 4.0–10.5)
nRBC: 0 % (ref 0.0–0.2)

## 2021-04-04 LAB — CBG MONITORING, ED: Glucose-Capillary: 88 mg/dL (ref 70–99)

## 2021-04-04 LAB — TSH: TSH: 1.33 u[IU]/mL (ref 0.350–4.500)

## 2021-04-04 LAB — MAGNESIUM: Magnesium: 1.9 mg/dL (ref 1.7–2.4)

## 2021-04-04 MED ORDER — ACETAMINOPHEN 325 MG PO TABS
650.0000 mg | ORAL_TABLET | Freq: Once | ORAL | Status: AC
  Administered 2021-04-04: 650 mg via ORAL
  Filled 2021-04-04: qty 2

## 2021-04-04 MED ORDER — LIDOCAINE 5 % EX PTCH
1.0000 | MEDICATED_PATCH | Freq: Once | CUTANEOUS | Status: DC
  Administered 2021-04-04: 1 via TRANSDERMAL
  Filled 2021-04-04: qty 1

## 2021-04-04 NOTE — Discharge Instructions (Signed)
Follow-up with your PCP regarding management of your high blood pressure and chronic pain issues.  May continue to use normal home medications as previously prescribed.  Please return to the ED for any new or worsening symptoms.

## 2021-04-04 NOTE — ED Provider Notes (Signed)
[facility] EMERGENCY DEPARTMENT Provider Note   CSN: [id number] Arrival date & time: 04/04/21  1812     History Chief Complaint  Patient presents with   Weakness    Chronic pain and weakness worse than usual    Shirley Hansen is a 55 y.o. female.   Weakness Associated symptoms: dizziness   Associated symptoms: no abdominal pain, no arthralgias, no chest pain, no cough, no diarrhea, no dysuria, no fever, no headaches, no nausea, no seizures, no shortness of breath and no vomiting    Patient presents to the ED with complaints of generalized weakness, paresthesias, and muscle spasms.  Patient reports that since her prior CVA, she has had worsening and intermittent left upper and left lower extremity weakness, muscle spasms, contractures, and tremors.  She reports that at 11:00 AM today, she had sudden onset of worsening tremors of her LUE, associated with flexion contractures of the left arm and weakness of the left leg that limited ability to ambulate.  She denies any falls or injuries recently.  She has associated dizziness and numbness diffusely of the left face, LUE, and LLE.  She denies syncope, visual changes, chest pain or shortness of breath, abdominal pain, nausea or vomiting, rash, or any other complaints.  She takes a daily aspirin but no other blood thinners.  She checked her blood pressure and was found to be hypertensive, increased from prior baseline at that time.  Follows closely with her PCP.  Recently de-escalated her blood pressure regimen and reports that her blood pressure has been uptrending since decreasing the number of antihypertensive agents that she takes.  She also reports that she recently discontinued her baclofen and is now attempting Flexeril for her chronic spasms.  She has outpatient follow-up scheduled and she reports possible upcoming MRI.  Per record review, this patient has presented to this ED 2 times in the past 2 weeks for similar  symptoms.  She has had 2 negative head CTs on 10/12 and 10/15.  Past Medical History:  Diagnosis Date   Hypertension    Pancreatitis    Stage 3a chronic kidney disease (HCC)    Stroke [facility])     Patient Active Problem List   Diagnosis Date Noted   History of stroke 03/23/2021   Chronic left shoulder pain 03/23/2021   Depression 03/23/2021   Glaucoma of both eyes 03/23/2021   Dizziness 03/13/2021   Orthostatic hypotension 03/13/2021   Weakness 03/05/2021   COVID-19 07/14/2020   Stroke (HCC)    Stage 3a chronic kidney disease (HCC)    Hypertension     Past Surgical History:  Procedure Laterality Date   TUBAL LIGATION       OB History   No obstetric history on file.     History reviewed. No pertinent family history.  Social History   Tobacco Use   Smoking status: Former   Smokeless tobacco: Never  Substance Use Topics   Alcohol use: Never   Drug use: Never    Home Medications Prior to Admission medications   Medication Sig Start Date End Date Taking? Authorizing Provider  acetaminophen (TYLENOL) 325 MG tablet Take 650 mg by mouth every 6 (six) hours as needed for mild pain.    [provider]  albuterol (VENTOLIN HFA) 108 (90 Base) MCG/ACT inhaler Inhale 2 puffs into the lungs every 2 (two) hours as needed for wheezing or shortness of breath. 07/28/20   Ghimire, Werner Lean, MD  amitriptyline (ELAVIL) 25 MG tablet  Take 25 mg by mouth daily at 6 (six) AM. 02/07/21 02/07/22  [provider]  Artificial Tear Solution (GENTEAL TEARS OP) Place 1 drop into both eyes daily as needed (dry eyes).    [provider]  aspirin EC 81 MG tablet Take 81 mg by mouth daily. Swallow whole.    [provider]  atorvastatin (LIPITOR) 80 MG tablet Take 80 mg by mouth at bedtime. 02/07/21 02/07/22  [provider]  baclofen (LIORESAL) 10 MG tablet Take 10 mg by mouth 4 (four) times daily. 01/28/21   [provider]  brimonidine (ALPHAGAN)  0.2 % ophthalmic solution Place 1 drop into both eyes 2 (two) times daily. 06/26/20   [provider]  carvedilol (COREG) 12.5 MG tablet Take 1 tablet (12.5 mg total) by mouth 2 (two) times daily. 03/13/21 04/12/21  Hughie Closs, MD  cyclobenzaprine (FLEXERIL) 10 MG tablet Take 1 tablet (10 mg total) by mouth 3 (three) times daily as needed for muscle spasms. 03/22/21   Mayers, Cari S, PA-C  diclofenac Sodium (VOLTAREN) 1 % GEL Apply 1 application topically 4 (four) times daily as needed (pain). 05/20/20   [provider]  dorzolamide-timolol (COSOPT) 22.3-6.8 MG/ML ophthalmic solution Place 1 drop into both eyes 2 (two) times daily. 05/31/20   [provider]  DULoxetine (CYMBALTA) 60 MG capsule Take 60 mg by mouth daily. 02/07/21 02/07/22  [provider]  escitalopram (LEXAPRO) 5 MG tablet Take 5 mg by mouth daily. 01/28/21   [provider]  lamoTRIgine (LAMICTAL) 25 MG tablet Take 25 mg by mouth daily. 02/08/21   [provider]  latanoprost (XALATAN) 0.005 % ophthalmic solution Place 1 drop into both eyes at bedtime. 06/17/20   [provider]  meclizine (ANTIVERT) 25 MG tablet Take 1 tablet (25 mg total) by mouth 3 (three) times daily as needed for dizziness. 02/22/21   Linwood Dibbles, MD  Netarsudil Dimesylate 0.02 % SOLN Place 1 drop into both eyes at bedtime. 02/07/21   [provider]    Allergies    Patient has no known allergies.  Review of Systems   Review of Systems  Constitutional:  Negative for activity change, appetite change, chills and fever.  HENT:  Negative for ear pain, facial swelling and sore throat.   Eyes:  Negative for photophobia and visual disturbance.  Respiratory:  Negative for cough, chest tightness and shortness of breath.   Cardiovascular:  Negative for chest pain and palpitations.  Gastrointestinal:  Negative for abdominal distention, abdominal pain, constipation, diarrhea, nausea and vomiting.   Genitourinary:  Negative for dysuria and hematuria.  Musculoskeletal:  Positive for back pain. Negative for arthralgias, neck pain and neck stiffness.  Skin:  Negative for color change, pallor and rash.  Neurological:  Positive for dizziness, tremors, weakness and numbness. Negative for seizures, syncope, facial asymmetry, speech difficulty, light-headedness and headaches.  Hematological:  Does not bruise/bleed easily.  Psychiatric/Behavioral:  Negative for confusion. The patient is not nervous/anxious.   All other systems reviewed and are negative.  Physical Exam Updated Vital Signs BP (!) 162/105   Pulse 99   Temp 98.4 F (36.9 C) (Oral)   Resp 18   Wt 85.3 kg   SpO2 97%   BMI 32.27 kg/m   Physical Exam Vitals and nursing note reviewed.  Constitutional:      General: She is not in acute distress.    Appearance: Normal appearance. She is well-developed. She is obese. She is not ill-appearing.  HENT:     Head: Normocephalic and atraumatic.     Right Ear: External ear normal.     Left Ear: External ear normal.     Nose: Nose normal.     Mouth/Throat:     Mouth: Mucous membranes are moist.     Pharynx: Oropharynx is clear.  Eyes:     General: No scleral icterus.    Extraocular Movements: Extraocular movements intact.     Conjunctiva/sclera: Conjunctivae normal.     Pupils: Pupils are equal, round, and reactive to light.  Cardiovascular:     Rate and Rhythm: Normal rate and regular rhythm.     Heart sounds: No murmur heard. Pulmonary:     Effort: Pulmonary effort is normal. No respiratory distress.     Breath sounds: Normal breath sounds.  Abdominal:     Palpations: Abdomen is soft.     Tenderness: There is no abdominal tenderness. There is no guarding or rebound.  Musculoskeletal:        General: Normal range of motion.     Cervical back: Normal range of motion and neck supple. No rigidity or tenderness.     Right lower leg: No edema.     Left lower leg: No edema.   Lymphadenopathy:     Cervical: No cervical adenopathy.  Skin:    General: Skin is warm and dry.     Capillary Refill: Capillary refill takes less than 2 seconds.  Neurological:     General: No focal deficit present.     Mental Status: She is alert and oriented to person, place, and time. Mental status is at baseline.     GCS: GCS eye subscore is 4. GCS verbal subscore is 5. GCS motor subscore is 6.     Cranial Nerves: Cranial nerves 2-12 are intact. No cranial nerve deficit, dysarthria or facial asymmetry.     Sensory: Sensory deficit present.     Motor: Weakness and tremor present. No abnormal muscle tone or seizure activity.     Comments: Numbness of left face, LUE, and LLE.  Tremor of LUE.  4/5 strength of LLE and LUE.  Occultly with finger-to-nose secondary to tremor.  Psychiatric:        Mood and Affect: Mood normal.        Behavior: Behavior normal.    ED Results / Procedures / Treatments   Labs (all labs ordered are listed, but only abnormal results are displayed) Labs Reviewed  BASIC METABOLIC PANEL - Abnormal; Notable for the following components:      Result Value   Potassium 3.3 (*)    All other components within normal limits  CBC WITH DIFFERENTIAL/PLATELET  MAGNESIUM  TSH  CBG MONITORING, ED    EKG EKG Interpretation  Date/Time:  Monday April 04 2021 20:47:24 EDT Ventricular Rate:  102 PR Interval:  142 QRS Duration: 143 QT Interval:  378 QTC Calculation: 493 R Axis:   -74 Text Interpretation: Sinus tachycardia RBBB and LAFB Confirmed by Blane Ohara [phone]) on 04/04/2021 9:17:39 PM  Radiology No results found.  Procedures Procedures   Medications Ordered in ED Medications  lidocaine (LIDODERM) 5 % 1 patch (1 patch Transdermal Patch Applied 04/04/21 2317)  acetaminophen (TYLENOL) tablet 650 mg (650 mg Oral Given 04/04/21 2315)    ED Course  I have reviewed the triage vital signs and the nursing notes.  Pertinent labs & imaging results that  were available during my care of the patient were reviewed by me and  considered in my medical decision making (see chart for details).    MDM Rules/Calculators/A&P                           Patient is a 55 year old female presenting for acute on chronic neurologic symptoms and spasms.  VS significant for mild hypertension, appears near prior baseline on other ED visits recently.  EKG interpretation:  Labs:  Imaging not indicated at this time, given lack of change from prior repeat visits with negative head imaging.  DDx considered: ICH, head trauma, meningitis, encephalopathy, CVA, hypoglycemia, electrolyte abnormality hypertensive emergency.  History, exam, and objective findings consistent with exacerbation of chronic pain.  Low suspicion for acute pathology given multiple recent visits for similar complaints, and unchanged neurologic and physical exam from prior visits with negative imaging.  No signs of trauma on exam.  No systemic infectious signs or symptoms.  No cranial nerve palsies.  Diffuse left-sided numbness inconsistent with physiologic distribution.  No signs of endorgan damage on labs, and neuro exam appears at prior baseline.  Given pain control with Tylenol and lidocaine patch, advised to discuss management of chronic spasms and pain with PCP.  Patient reevaluated prior to discharge.  Hemodynamically stable and in no acute distress.  No changes on neurologic exam.  Final BP mildly elevated, no tachycardia.  Discharged home in stable condition.  Outpatient follow-up with PCP to discuss home BP regimen and pain control, possible outpatient MRI.  Supportive care discussed.  Strict ED return precautions discussed.  She understands and agrees with the plan.  Dragon medical dictation used for this report.  Patient case discussed with attending physician, who agrees with the plan.   Final Clinical Impression(s) / ED Diagnoses Final diagnoses:  Tremor    Rx / DC Orders ED  Discharge Orders     None        Dwaine Gale, DO 04/05/21 4580    Blane Ohara, MD 04/05/21 1447

## 2021-04-04 NOTE — ED Triage Notes (Signed)
BIB [facility] from home, lives with family, c/o L sided neck, arm and leg pain and weakness worse than usual, onset 1130. H/o CVA 2020. H/o fall 02/12/2021. Admitted here last week for HTN, and "d/c'd 3 BP meds". NSL 20g R FA. Denies HA. Also c/o nausea. Zofran given. Cbg 126. Alert, NAD, calm. Cinncinati SS (-)

## 2021-04-05 ENCOUNTER — Other Ambulatory Visit: Payer: Self-pay

## 2021-04-05 ENCOUNTER — Ambulatory Visit: Payer: Medicaid Other | Attending: Physician Assistant | Admitting: Physician Assistant

## 2021-04-05 ENCOUNTER — Ambulatory Visit (INDEPENDENT_AMBULATORY_CARE_PROVIDER_SITE_OTHER): Payer: Medicaid Other | Admitting: Orthopaedic Surgery

## 2021-04-05 ENCOUNTER — Encounter: Payer: Self-pay | Admitting: Physician Assistant

## 2021-04-05 VITALS — BP 163/122 | HR 102 | Temp 98.9°F | Resp 18 | Ht 64.0 in

## 2021-04-05 DIAGNOSIS — I10 Essential (primary) hypertension: Secondary | ICD-10-CM

## 2021-04-05 DIAGNOSIS — M25512 Pain in left shoulder: Secondary | ICD-10-CM

## 2021-04-05 DIAGNOSIS — R42 Dizziness and giddiness: Secondary | ICD-10-CM | POA: Diagnosis not present

## 2021-04-05 DIAGNOSIS — M62838 Other muscle spasm: Secondary | ICD-10-CM

## 2021-04-05 DIAGNOSIS — I69354 Hemiplegia and hemiparesis following cerebral infarction affecting left non-dominant side: Secondary | ICD-10-CM

## 2021-04-05 DIAGNOSIS — G8929 Other chronic pain: Secondary | ICD-10-CM | POA: Diagnosis not present

## 2021-04-05 MED ORDER — CARVEDILOL 25 MG PO TABS
25.0000 mg | ORAL_TABLET | Freq: Two times a day (BID) | ORAL | 0 refills | Status: DC
  Filled 2021-04-05: qty 60, 30d supply, fill #0

## 2021-04-05 MED ORDER — CYCLOBENZAPRINE HCL 10 MG PO TABS
10.0000 mg | ORAL_TABLET | Freq: Three times a day (TID) | ORAL | 0 refills | Status: DC | PRN
  Filled 2021-04-05 (×2): qty 30, 10d supply, fill #0

## 2021-04-05 MED ORDER — PREGABALIN 50 MG PO CAPS
50.0000 mg | ORAL_CAPSULE | Freq: Two times a day (BID) | ORAL | 1 refills | Status: DC
  Filled 2021-04-05 (×2): qty 60, 30d supply, fill #0

## 2021-04-05 NOTE — Progress Notes (Signed)
Established Patient Office Visit  Subjective:  Patient ID: Shirley Hansen, female    DOB: 12/01/1965  Age: 55 y.o. MRN: [id number]  CC:  Chief Complaint  Patient presents with   Follow-up    HPI Shirley Hansen  States that since her last office visit on March 22, 2021, she has been seen in the emergency department for neck spasms and pain  Was last seen yesterday, hospital course:  MDM Rules/Calculators/A&P                         Patient is a 55 year old female presenting for acute on chronic neurologic symptoms and spasms.  VS significant for mild hypertension, appears near prior baseline on other ED visits recently.   EKG interpretation:   Labs:   Imaging not indicated at this time, given lack of change from prior repeat visits with negative head imaging.   DDx considered: ICH, head trauma, meningitis, encephalopathy, CVA, hypoglycemia, electrolyte abnormality hypertensive emergency.  History, exam, and objective findings consistent with exacerbation of chronic pain.  Low suspicion for acute pathology given multiple recent visits for similar complaints, and unchanged neurologic and physical exam from prior visits with negative imaging.  No signs of trauma on exam.  No systemic infectious signs or symptoms.  No cranial nerve palsies.  Diffuse left-sided numbness inconsistent with physiologic distribution.  No signs of endorgan damage on labs, and neuro exam appears at prior baseline.   Given pain control with Tylenol and lidocaine patch, advised to discuss management of chronic spasms and pain with PCP.  Patient reevaluated prior to discharge.  Hemodynamically stable and in no acute distress.  No changes on neurologic exam.  Final BP mildly elevated, no tachycardia.   Discharged home in stable condition.  Outpatient follow-up with PCP to discuss home BP regimen and pain control, possible outpatient MRI.  Supportive care discussed.  Strict ED return precautions discussed.  She understands  and agrees with the plan.  States today that her pain is not controlled, previously taken off gabapentin and was started on Lyrica.  States that the gabapentin made her dizzy.  States that the Lyrica did offer some relief. Flexeril has not offered much relief  States that she has a f/u with Neurology on 05/03/21 and has an ortho appt later today.  States that PT comes to her house     Past Medical History:  Diagnosis Date   Hypertension    Pancreatitis    Stage 3a chronic kidney disease ([place])    Stroke [facility])     Past Surgical History:  Procedure Laterality Date   TUBAL LIGATION      History reviewed. No pertinent family history.  Social History   Socioeconomic History   Marital status: Single    Spouse name: Not on file   Number of children: Not on file   Years of education: Not on file   Highest education level: Not on file  Occupational History   Not on file  Tobacco Use   Smoking status: Former   Smokeless tobacco: Never  Substance and Sexual Activity   Alcohol use: Never   Drug use: Never   Sexual activity: Not on file  Other Topics Concern   Not on file  Social History Narrative   Not on file   Social Determinants of Health   Financial Resource Strain: Not on file  Food Insecurity: Not on file  Transportation Needs: Not on file  Physical Activity: Not  on file  Stress: Not on file  Social Connections: Not on file  Intimate Partner Violence: Not on file    Outpatient Medications Prior to Visit  Medication Sig Dispense Refill   acetaminophen (TYLENOL) 325 MG tablet Take 650 mg by mouth every 6 (six) hours as needed for mild pain.     albuterol (VENTOLIN HFA) 108 (90 Base) MCG/ACT inhaler Inhale 2 puffs into the lungs every 2 (two) hours as needed for wheezing or shortness of breath.     amitriptyline (ELAVIL) 25 MG tablet Take 25 mg by mouth daily at 6 (six) AM.     Artificial Tear Solution (GENTEAL TEARS OP) Place 1 drop into both eyes daily as needed  (dry eyes).     aspirin EC 81 MG tablet Take 81 mg by mouth daily. Swallow whole.     atorvastatin (LIPITOR) 80 MG tablet Take 80 mg by mouth at bedtime.     brimonidine (ALPHAGAN) 0.2 % ophthalmic solution Place 1 drop into both eyes 2 (two) times daily.     diclofenac Sodium (VOLTAREN) 1 % GEL Apply 1 application topically 4 (four) times daily as needed (pain).     dorzolamide-timolol (COSOPT) 22.3-6.8 MG/ML ophthalmic solution Place 1 drop into both eyes 2 (two) times daily.     DULoxetine (CYMBALTA) 60 MG capsule Take 60 mg by mouth daily.     escitalopram (LEXAPRO) 5 MG tablet Take 5 mg by mouth daily.     lamoTRIgine (LAMICTAL) 25 MG tablet Take 25 mg by mouth daily.     latanoprost (XALATAN) 0.005 % ophthalmic solution Place 1 drop into both eyes at bedtime.     meclizine (ANTIVERT) 25 MG tablet Take 1 tablet (25 mg total) by mouth 3 (three) times daily as needed for dizziness. 30 tablet 0   Netarsudil Dimesylate 0.02 % SOLN Place 1 drop into both eyes at bedtime.     carvedilol (COREG) 12.5 MG tablet Take 1 tablet (12.5 mg total) by mouth 2 (two) times daily. 60 tablet 0   cyclobenzaprine (FLEXERIL) 10 MG tablet Take 1 tablet (10 mg total) by mouth 3 (three) times daily as needed for muscle spasms. 30 tablet 0   baclofen (LIORESAL) 10 MG tablet Take 10 mg by mouth 4 (four) times daily. (Patient not taking: Reported on 04/05/2021)     No facility-administered medications prior to visit.    No Known Allergies  ROS Review of Systems  Constitutional: Negative.   HENT: Negative.    Eyes: Negative.   Respiratory:  Negative for shortness of breath.   Cardiovascular:  Negative for chest pain.  Gastrointestinal: Negative.   Endocrine: Negative.   Genitourinary: Negative.   Musculoskeletal:  Positive for arthralgias and myalgias.  Skin: Negative.   Allergic/Immunologic: Negative.   Neurological:  Positive for dizziness. Negative for seizures, speech difficulty and headaches.   Hematological: Negative.   Psychiatric/Behavioral: Negative.       Objective:    Physical Exam Vitals and nursing note reviewed.  Constitutional:      Appearance: Normal appearance. She is obese.  HENT:     Head: Normocephalic and atraumatic.     Right Ear: External ear normal.     Left Ear: External ear normal.     Nose: Nose normal.     Mouth/Throat:     Mouth: Mucous membranes are moist.     Pharynx: Oropharynx is clear.  Eyes:     Extraocular Movements: Extraocular movements intact.     Conjunctiva/sclera:  Conjunctivae normal.     Pupils: Pupils are equal, round, and reactive to light.  Cardiovascular:     Rate and Rhythm: Normal rate and regular rhythm.     Pulses: Normal pulses.     Heart sounds: Normal heart sounds.  Pulmonary:     Effort: Pulmonary effort is normal.     Breath sounds: Normal breath sounds.  Musculoskeletal:     Cervical back: Normal range of motion and neck supple.     Comments: In wheelchair, left sided weakness, decreased ROM  Skin:    General: Skin is warm and dry.  Neurological:     Mental Status: She is alert.  Psychiatric:        Mood and Affect: Mood normal. Affect is tearful.        Behavior: Behavior normal.        Thought Content: Thought content normal.        Judgment: Judgment normal.    BP (!) 163/122 (BP Location: Right Arm, Patient Position: Sitting, Cuff Size: Normal)   Pulse (!) 102   Temp 98.9 F (37.2 C) (Oral)   Resp 18   Ht $R'5\' 4"'cv$  (1.626 m)   SpO2 97%   BMI 32.27 kg/m  Wt Readings from Last 3 Encounters:  04/04/21 188 lb (85.3 kg)  03/25/21 188 lb 0.8 oz (85.3 kg)  03/23/21 188 lb (85.3 kg)     Health Maintenance Due  Topic Date Due   Hepatitis C Screening  Never done   PAP SMEAR-Modifier  Never done   COLONOSCOPY (Pts 45-32yrs Insurance coverage will need to be confirmed)  Never done   MAMMOGRAM  Never done   Zoster Vaccines- Shingrix (1 of 2) Never done   INFLUENZA VACCINE  01/10/2021   COVID-19  Vaccine (3 - Booster for [place] series) 03/02/2021    There are no preventive care reminders to display for this patient.  Lab Results  Component Value Date   TSH 1.330 04/04/2021   Lab Results  Component Value Date   WBC 8.0 04/04/2021   HGB 13.0 04/04/2021   HCT 40.6 04/04/2021   MCV 91.0 04/04/2021   PLT 370 04/04/2021   Lab Results  Component Value Date   NA 139 04/04/2021   K 3.3 (L) 04/04/2021   CO2 28 04/04/2021   GLUCOSE 87 04/04/2021   BUN 10 04/04/2021   CREATININE 0.86 04/04/2021   BILITOT 0.6 03/26/2021   ALKPHOS 72 03/26/2021   AST 20 03/26/2021   ALT 26 03/26/2021   PROT 6.7 03/26/2021   ALBUMIN 3.5 03/26/2021   CALCIUM 9.0 04/04/2021   ANIONGAP 9 04/04/2021   EGFR 82 03/22/2021   No results found for: CHOL No results found for: HDL No results found for: LDLCALC No results found for: TRIG No results found for: CHOLHDL No results found for: HGBA1C    Assessment & Plan:   Problem List Items Addressed This Visit       Cardiovascular and Mediastinum   Hypertension   Relevant Medications   carvedilol (COREG) 25 MG tablet     Other   Dizziness   Chronic left shoulder pain   Relevant Medications   pregabalin (LYRICA) 50 MG capsule   cyclobenzaprine (FLEXERIL) 10 MG tablet   Other Visit Diagnoses     Hemiparesis affecting left side as late effect of cerebrovascular accident (CVA) ([place])    -  Primary   Relevant Medications   pregabalin (LYRICA) 50 MG capsule   Muscle spasticity  Relevant Medications   pregabalin (LYRICA) 50 MG capsule       Meds ordered this encounter  Medications   carvedilol (COREG) 25 MG tablet    Sig: Take 1 tablet (25 mg total) by mouth 2 (two) times daily.    Dispense:  60 tablet    Refill:  0    Dose change    Order Specific Question:   Supervising Provider    Answer:   Asencion Noble E [[id number]]   pregabalin (LYRICA) 50 MG capsule    Sig: Take 1 capsule (50 mg total) by mouth 2 (two) times daily.     Dispense:  60 capsule    Refill:  1    Order Specific Question:   Supervising Provider    Answer:   Asencion Noble E [[id number]]   cyclobenzaprine (FLEXERIL) 10 MG tablet    Sig: Take 1 tablet (10 mg total) by mouth 3 (three) times daily as needed for muscle spasms.    Dispense:  30 tablet    Refill:  0    Stop baclofen    Order Specific Question:   Supervising Provider    Answer:   Joya Gaskins, PATRICK E [[id number]]   1. Hemiparesis affecting left side as late effect of cerebrovascular accident (CVA) ([place]) On review of medical record, patient was on Lyrica 50 mg twice a day, it does appear that this medication was not refilled due to patient changing health systems.  Check of [place] controlled substance registry appropriate.  Resume current regimen - pregabalin (LYRICA) 50 MG capsule; Take 1 capsule (50 mg total) by mouth 2 (two) times daily.  Dispense: 60 capsule; Refill: 1  2. Muscle spasticity Resume Lyrica - pregabalin (LYRICA) 50 MG capsule; Take 1 capsule (50 mg total) by mouth 2 (two) times daily.  Dispense: 60 capsule; Refill: 1  3. Dizziness Continue current dosing of meclizine as needed  4. Primary hypertension Increase carvedilol 25 mg twice daily.  Patient encouraged to check blood pressure at home, keep a written log and have available for all office visits.  Red flags given for prompt reevaluation.  Patient will have a virtual visit with this provider in 1 week, was given appointment [facility] - carvedilol (COREG) 25 MG tablet; Take 1 tablet (25 mg total) by mouth 2 (two) times daily.  Dispense: 60 tablet; Refill: 0  5. Chronic left shoulder pain Continue follow-up with orthopedics - cyclobenzaprine (FLEXERIL) 10 MG tablet; Take 1 tablet (10 mg total) by mouth 3 (three) times daily as needed for muscle spasms.  Dispense: 30 tablet; Refill: 0   I have reviewed the patient's medical history (PMH, PSH,  Social History, Family History, Medications, and allergies) , and have been updated if relevant. I spent 30 minutes reviewing chart and  face to face time with patient.    Follow-up: Return in about 1 week (around 04/12/2021).    Loraine Grip Mayers, PA-C

## 2021-04-05 NOTE — Patient Instructions (Signed)
You will start taking 25 mg of carvedilol twice daily continue checking your blood pressure several times a day, keeping a written log and having available for all office visits.  Please plan on a virtual visit with me in 1 week at 9:30 AM.  We are going to restart your Lyrica 50 mg twice a day.  Roney Jaffe, PA-C Physician Assistant [facility] [email or web address]   Spasticity Spasticity is a condition in which your muscles contract suddenly and unpredictably (spasm). Spasticity usually affects your arms, legs, or back. It can also affect the way you walk. Spasticity can range from mild muscle stiffness and tightness to severe, uncontrollable muscle spasms. Severe spasticity can be painful and can freeze your muscles in an uncomfortable position. Follow these instructions at home: Managing muscle stiffness and spasms   Wear a brace as told by your health care provider to prevent muscle contractions. Have the affected muscles massaged. If directed, apply heat to the affected muscle area. Use the heat source that your health care provider recommends, such as a moist heat pack or heating pad. Place a towel between your skin and the heat source. Leave the heat on for 20-30 minutes. Remove the head if your skin turns bright red. This is especially important if you are unable to feel pain, heat, or cold. You may have a greater risk of getting burned. If directed, apply ice to the affected muscle area: Put ice in a plastic bag. Place a towel between your skin and the bag or between your brace and the bag. Leave the ice on for 20 minutes, 2?3 times a day. Activity Stay active as directed by your health care provider. Find a safe exercise program that fits your needs and ability. Maintain good posture when walking and sitting. Work with a physical therapist to learn exercises that will stretch and strengthen your muscles. Do stretching  and range of motion exercises at home as told by a physical therapist. Work with an occupational therapist. This type of health care provider can help you function better at home and at work. If you have severe spasticity, use mobility aids, such as a walker or cane, as told by your health care provider. General instructions Watch your condition for any changes. Wear loose, comfortable clothing that does not restrict your movement. Wear closed-toe shoes that fit well and support your feet. Wear shoes that have rubber soles or low heels. Keep all follow-up visits as told by your health care provider. This is important. Take over-the-counter and prescription medicine only as told by your health care provider. Contact a health care provider if you: Have worsening muscle spasms. Develop other symptoms along with spasticity. Have a fever or chills. Experience a burning feeling when you pass urine. Become constipated. Need more support at home. Get help right away if you: Have trouble breathing. Have a muscle spasm that freezes you into a painful position. Cannot walk. Cannot care for yourself at home. Have trouble passing urine or have urinary incontinence. Summary Spasticity is a condition in which your muscles contract suddenly and unpredictably (spasm). Spasticity usually affects your arms, legs, or back. Spasticity can range from mild muscle stiffness and tightness to severe, uncontrollable muscle spasms. Do stretching and range of motion exercises at home as told by a physical therapist. Take over-the-counter and prescription medicine only as told by your health care provider. This information is not intended to replace advice given to you by your health care provider. Make sure  you discuss any questions you have with your health care provider. Document Revised: 06/26/2017 Document Reviewed: 06/26/2017 Elsevier Patient Education  2022 [employer].

## 2021-04-05 NOTE — ED Notes (Signed)
Pt discharged and wheeled out of the ED in a wheel chair without difficulty. 

## 2021-04-05 NOTE — Progress Notes (Signed)
Patient took BP medication around 9:30 am and has not eaten today. Patient reports right shoulder pain with no relief with medications. Patient states she feels extremely weak and reports HIGH BP readings in home. Patient reports nausea on today with no vomiting.

## 2021-04-05 NOTE — Progress Notes (Signed)
Office Visit Note   Patient: Shirley Hansen           Date of Birth: 1965-12-10           MRN: [id number] Visit Date: 04/05/2021              Requested by: Mayers, Kasandra Knudsen, PA-C [id number] [place],  [place] [address] PCP: Georganna Skeans, MD   Assessment & Plan: Visit Diagnoses:  1. Chronic left shoulder pain     Plan: Impression is chronic left shoulder pain since a fall approximately 2 months ago.  I believe her symptoms are primarily coming from her shoulder and not her neck given that she had great, but temporary relief following the cortisone injection from [facility] a few months ago.  I would like to go ahead and order an MRI of the left shoulder to assess for rotator cuff pathology given her lack of strength and temporary relief from the injection.  She will follow-up with [name] once this is been completed.  Call with concerns or questions in the meantime.  Follow-Up Instructions: Return for f/u after MRI left shoulder.   Orders:  No orders of the defined types were placed in this encounter.  No orders of the defined types were placed in this encounter.     Procedures: No procedures performed   Clinical Data: No additional findings.   Subjective: Chief Complaint  Patient presents with   Left Shoulder - Pain    HPI patient is a 55 year old female who comes in today with left shoulder pain.  Approximately 2 months ago she fell landing on the left shoulder.  She was seen where x-rays of the left shoulder were obtained.  These were negative for acute findings.  She also underwent CT of the cervical spine which showed mild degenerative changes.  She is here today with primarily left anterior shoulder and parascapular pain and stiffness.  Symptoms are worse with cold weather.  She has been taking Tylenol and oxycodone without significant relief.  She does note occasional paresthesias to the ring and small fingers.  She does note that she was seen by [facility] back in May/June where what sounds like subacromial cortisone injection was performed.  She notes this significantly helped for about 2 to 3 weeks.  Of note, she is status post CVA back in 2020 which affected the left upper extremity causing weakness.  She is getting home health physical therapy to the left shoulder.  Review of Systems as detailed in HPI.  All others reviewed and are negative.   Objective: Vital Signs: There were no vitals taken for this visit.  Physical Exam well-developed well-nourished female in no acute distress.  Alert and oriented x3.  Ortho Exam examination of the left shoulder reveals active range of motion to approximately 75 degrees.  I can passively get her to approximately 120.  Mild tenderness to the [facility] joint.  Moderate tenderness along the left parascapular region.  3 out of 5 strength throughout.  She is neurovascular intact distally.  Cervical spine exam is unremarkable  Specialty Comments:  No specialty comments available.  Imaging: No new imaging   PMFS History: Patient Active Problem List   Diagnosis Date Noted   History of stroke 03/23/2021   Chronic left shoulder pain 03/23/2021   Depression 03/23/2021   Glaucoma of both eyes 03/23/2021   Dizziness 03/13/2021   Orthostatic hypotension 03/13/2021   Weakness 03/05/2021   COVID-19 07/14/2020  Stroke (HCC)    Stage 3a chronic kidney disease (HCC)    Hypertension    Past Medical History:  Diagnosis Date   Hypertension    Pancreatitis    Stage 3a chronic kidney disease (HCC)    Stroke (HCC)     No family history on file.  Past Surgical History:  Procedure Laterality Date   TUBAL LIGATION     Social History   Occupational History   Not on file  Tobacco Use   Smoking status: Former   Smokeless tobacco: Never  Substance and Sexual Activity   Alcohol use: Never   Drug use: Never   Sexual activity: Not on file

## 2021-04-06 ENCOUNTER — Other Ambulatory Visit: Payer: Self-pay

## 2021-04-06 DIAGNOSIS — G8929 Other chronic pain: Secondary | ICD-10-CM

## 2021-04-06 NOTE — Telephone Encounter (Signed)
Dois Davenport from [facility] checking on the status of form [id number] faxed over 3 times. Dois Davenport would like a follow up call today or the form back today to proceed with home health services. Patient was seen by Maurene Capes on 04/05/21, Please complete and  fax forms back to both fax # [phone] and [phone]. Dois Davenport can be reached at  phone # [phone]

## 2021-04-07 ENCOUNTER — Telehealth: Payer: Self-pay | Admitting: *Deleted

## 2021-04-07 DIAGNOSIS — M62838 Other muscle spasm: Secondary | ICD-10-CM | POA: Insufficient documentation

## 2021-04-07 DIAGNOSIS — I69354 Hemiplegia and hemiparesis following cerebral infarction affecting left non-dominant side: Secondary | ICD-10-CM | POA: Insufficient documentation

## 2021-04-07 NOTE — Telephone Encounter (Signed)
[facility]- Shirley Hansen  Calling to report BP elevation: 151/103 and 147/106- no cardiac or neurological symptoms  Patient states she needs 2 day notice of appointment to schedule her transportation service. Advised would send notification to office for scheduling.

## 2021-04-08 ENCOUNTER — Encounter (HOSPITAL_COMMUNITY): Payer: Self-pay | Admitting: Emergency Medicine

## 2021-04-08 ENCOUNTER — Emergency Department (HOSPITAL_COMMUNITY): Payer: Medicaid Other

## 2021-04-08 ENCOUNTER — Observation Stay (HOSPITAL_COMMUNITY)
Admission: EM | Admit: 2021-04-08 | Discharge: 2021-04-10 | Disposition: A | Discharge status: Home / Self Care | Payer: Medicaid Other | Attending: Internal Medicine | Admitting: Internal Medicine

## 2021-04-08 ENCOUNTER — Ambulatory Visit: Payer: Self-pay | Admitting: *Deleted

## 2021-04-08 DIAGNOSIS — Z20822 Contact with and (suspected) exposure to covid-19: Secondary | ICD-10-CM | POA: Insufficient documentation

## 2021-04-08 DIAGNOSIS — Y900 Blood alcohol level of less than 20 mg/100 ml: Secondary | ICD-10-CM | POA: Insufficient documentation

## 2021-04-08 DIAGNOSIS — Z8616 Personal history of COVID-19: Secondary | ICD-10-CM | POA: Diagnosis not present

## 2021-04-08 DIAGNOSIS — I69354 Hemiplegia and hemiparesis following cerebral infarction affecting left non-dominant side: Secondary | ICD-10-CM

## 2021-04-08 DIAGNOSIS — N1831 Chronic kidney disease, stage 3a: Secondary | ICD-10-CM | POA: Insufficient documentation

## 2021-04-08 DIAGNOSIS — I129 Hypertensive chronic kidney disease with stage 1 through stage 4 chronic kidney disease, or unspecified chronic kidney disease: Secondary | ICD-10-CM | POA: Diagnosis not present

## 2021-04-08 DIAGNOSIS — Z7982 Long term (current) use of aspirin: Secondary | ICD-10-CM | POA: Diagnosis not present

## 2021-04-08 DIAGNOSIS — I2510 Atherosclerotic heart disease of native coronary artery without angina pectoris: Secondary | ICD-10-CM | POA: Insufficient documentation

## 2021-04-08 DIAGNOSIS — Z87891 Personal history of nicotine dependence: Secondary | ICD-10-CM | POA: Diagnosis not present

## 2021-04-08 DIAGNOSIS — I160 Hypertensive urgency: Secondary | ICD-10-CM | POA: Diagnosis present

## 2021-04-08 DIAGNOSIS — Z8673 Personal history of transient ischemic attack (TIA), and cerebral infarction without residual deficits: Secondary | ICD-10-CM | POA: Insufficient documentation

## 2021-04-08 DIAGNOSIS — Z79899 Other long term (current) drug therapy: Secondary | ICD-10-CM | POA: Diagnosis not present

## 2021-04-08 DIAGNOSIS — M62838 Other muscle spasm: Secondary | ICD-10-CM

## 2021-04-08 DIAGNOSIS — G9340 Encephalopathy, unspecified: Secondary | ICD-10-CM | POA: Diagnosis not present

## 2021-04-08 DIAGNOSIS — R441 Visual hallucinations: Secondary | ICD-10-CM | POA: Diagnosis present

## 2021-04-08 LAB — CBC WITH DIFFERENTIAL/PLATELET
Abs Immature Granulocytes: 0.03 10*3/uL (ref 0.00–0.07)
Basophils Absolute: 0.1 10*3/uL (ref 0.0–0.1)
Basophils Relative: 1 %
Eosinophils Absolute: 0.2 10*3/uL (ref 0.0–0.5)
Eosinophils Relative: 2 %
HCT: 41.3 % (ref 36.0–46.0)
Hemoglobin: 13.4 g/dL (ref 12.0–15.0)
Immature Granulocytes: 0 %
Lymphocytes Relative: 28 %
Lymphs Abs: 2.1 10*3/uL (ref 0.7–4.0)
MCH: 29.5 pg (ref 26.0–34.0)
MCHC: 32.4 g/dL (ref 30.0–36.0)
MCV: 90.8 fL (ref 80.0–100.0)
Monocytes Absolute: 0.6 10*3/uL (ref 0.1–1.0)
Monocytes Relative: 8 %
Neutro Abs: 4.6 10*3/uL (ref 1.7–7.7)
Neutrophils Relative %: 61 %
Platelets: 356 10*3/uL (ref 150–400)
RBC: 4.55 MIL/uL (ref 3.87–5.11)
RDW: 14.2 % (ref 11.5–15.5)
WBC: 7.6 10*3/uL (ref 4.0–10.5)
nRBC: 0 % (ref 0.0–0.2)

## 2021-04-08 LAB — COMPREHENSIVE METABOLIC PANEL
ALT: 24 U/L (ref 0–44)
AST: 22 U/L (ref 15–41)
Albumin: 3.7 g/dL (ref 3.5–5.0)
Alkaline Phosphatase: 79 U/L (ref 38–126)
Anion gap: 7 (ref 5–15)
BUN: 9 mg/dL (ref 6–20)
CO2: 29 mmol/L (ref 22–32)
Calcium: 8.9 mg/dL (ref 8.9–10.3)
Chloride: 101 mmol/L (ref 98–111)
Creatinine, Ser: 0.78 mg/dL (ref 0.44–1.00)
GFR, Estimated: >60 mL/min (ref >60)
Glucose, Bld: 108 mg/dL — ABNORMAL HIGH (ref 70–99)
Potassium: 3.3 mmol/L — ABNORMAL LOW (ref 3.5–5.1)
Sodium: 137 mmol/L (ref 135–145)
Total Bilirubin: 0.6 mg/dL (ref 0.3–1.2)
Total Protein: 7.1 g/dL (ref 6.5–8.1)

## 2021-04-08 LAB — SALICYLATE LEVEL: Salicylate Lvl: <7 mg/dL — ABNORMAL LOW (ref 7.0–30.0)

## 2021-04-08 LAB — CBG MONITORING, ED: Glucose-Capillary: 82 mg/dL (ref 70–99)

## 2021-04-08 LAB — AMMONIA: Ammonia: 39 umol/L — ABNORMAL HIGH (ref 9–35)

## 2021-04-08 LAB — ACETAMINOPHEN LEVEL: Acetaminophen (Tylenol), Serum: <10 ug/mL — ABNORMAL LOW (ref 10–30)

## 2021-04-08 LAB — ETHANOL: Alcohol, Ethyl (B): <10 mg/dL (ref <10)

## 2021-04-08 MED ORDER — CARVEDILOL 12.5 MG PO TABS
25.0000 mg | ORAL_TABLET | Freq: Once | ORAL | Status: AC
  Administered 2021-04-08: 25 mg via ORAL
  Filled 2021-04-08: qty 2

## 2021-04-08 NOTE — ED Notes (Signed)
PT saw her sister in the room when the sister was not present. EDP is aware

## 2021-04-08 NOTE — ED Provider Notes (Addendum)
[facility] EMERGENCY DEPARTMENT Provider Note   CSN: [id number] Arrival date & time: 04/08/21  1527     History No chief complaint on file.   Shirley Hansen is a 55 y.o. female.  HPI  Patient is a 55 year old female with a chronic PMH significant for CKD, CVA with residual left-sided deficits, chronic pain, HTN, depression, and others as below who presents to the ED with complaints of visual hallucinations.  Several hours prior to arrival, the patient was reportedly with physical therapy and began to complain that there were "ants on the floor," but according to EMS, patient had complained of "insects and rotting flesh" to them.  There were no known falls or injuries.  Denies recent illnesses, fevers or chills, rash, chest pain or shortness of breath, abdominal pain, nausea or vomiting, rash, diarrhea headache, visual changes, dizziness or lightheadedness, syncope, weakness, or any other concerns.  Patient denies any drug or alcohol use for more than 2 years.  Patient denies medication overdose or recent medication changes.  Of note, this patient has been seen many times in this ED for the past several weeks.  On arrival, she has no complaints and states she is feeling well.  Per nursing, the patient has been witnessed speaking to an empty chair and stating she was speaking to her sister, who has not been present in the room.  The family member listed as a niece on the patient's chart, but the patient states that she would like her sister contacted, but does not have her sister's information available.   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     No family history on file.  Social History   Tobacco Use   Smoking status: Former   Smokeless tobacco: Never  Substance Use Topics   Alcohol use: Never   Drug use: Never    Home Medications Prior to Admission medications   Medication Sig Start Date End Date Taking? Authorizing Provider  acetaminophen (TYLENOL) 325 MG tablet Take 650 mg by mouth every 6 (six) hours as needed for mild pain.    [provider]  albuterol (VENTOLIN HFA) 108 (90 Base) MCG/ACT inhaler Inhale 2 puffs into the lungs every 2 (two) hours as needed for wheezing or shortness of breath. 07/28/20   Ghimire, Werner Lean, MD  amitriptyline (ELAVIL) 25 MG tablet Take 25 mg by mouth daily at 6 (six) AM. 02/07/21 02/07/22  [provider]  Artificial Tear Solution (GENTEAL TEARS OP) Place 1 drop into both eyes daily as needed (dry eyes).    [provider]  aspirin EC 81 MG tablet Take 81 mg by  mouth daily. Swallow whole.    [provider]  atorvastatin (LIPITOR) 80 MG tablet Take 80 mg by mouth at bedtime. 02/07/21 02/07/22  [provider]  baclofen (LIORESAL) 10 MG tablet Take 10 mg by mouth 4 (four) times daily. Patient not taking: Reported on 04/05/2021 01/28/21   [provider]  brimonidine (ALPHAGAN) 0.2 % ophthalmic solution Place 1 drop into both eyes 2 (two) times daily. 06/26/20   [provider]  carvedilol (COREG) 25 MG tablet Take 1 tablet (25 mg total) by mouth 2 (two) times daily. 04/05/21 05/05/21  Mayers, Cari S, PA-C   cyclobenzaprine (FLEXERIL) 10 MG tablet Take 1 tablet (10 mg total) by mouth 3 (three) times daily as needed for muscle spasms. 04/05/21   Mayers, Cari S, PA-C  diclofenac Sodium (VOLTAREN) 1 % GEL Apply 1 application topically 4 (four) times daily as needed (pain). 05/20/20   [provider]  dorzolamide-timolol (COSOPT) 22.3-6.8 MG/ML ophthalmic solution Place 1 drop into both eyes 2 (two) times daily. 05/31/20   [provider]  DULoxetine (CYMBALTA) 60 MG capsule Take 60 mg by mouth daily. 02/07/21 02/07/22  [provider]  escitalopram (LEXAPRO) 5 MG tablet Take 5 mg by mouth daily. 01/28/21   [provider]  lamoTRIgine (LAMICTAL) 25 MG tablet Take 25 mg by mouth daily. 02/08/21   [provider]  latanoprost (XALATAN) 0.005 % ophthalmic solution Place 1 drop into both eyes at bedtime. 06/17/20   [provider]  meclizine (ANTIVERT) 25 MG tablet Take 1 tablet (25 mg total) by mouth 3 (three) times daily as needed for dizziness. 02/22/21   Linwood Dibbles, MD  Netarsudil Dimesylate 0.02 % SOLN Place 1 drop into both eyes at bedtime. 02/07/21   [provider]  pregabalin (LYRICA) 50 MG capsule Take 1 capsule (50 mg total) by mouth 2 (two) times daily. 04/05/21   Mayers, Cari S, PA-C    Allergies    Patient has no known allergies.  Review of Systems   Review of Systems  Constitutional:  Negative for activity change, appetite change, chills, fatigue and fever.  HENT:  Negative for ear pain and sore throat.   Eyes:  Negative for pain and visual disturbance.  Respiratory:  Negative for cough and shortness of breath.   Cardiovascular:  Negative for chest pain and palpitations.  Gastrointestinal:  Negative for abdominal pain, constipation, diarrhea, nausea and vomiting.  Genitourinary:  Negative for dysuria and hematuria.  Musculoskeletal:  Negative for arthralgias and back pain.  Skin:  Negative for color change and rash.  Neurological:   Negative for dizziness, tremors, seizures, syncope, speech difficulty, weakness, light-headedness, numbness and headaches.  Psychiatric/Behavioral:  Positive for hallucinations. Negative for agitation, confusion and decreased concentration. The patient is not nervous/anxious and is not hyperactive.   All other systems reviewed and are negative.  Physical Exam Updated Vital Signs BP (!) 153/98   Pulse 94   Temp 97.9 F (36.6 C) (Oral)   Resp (!) 21   Ht 5\' 4"  (1.626 m)   Wt 85.3 kg   SpO2 99%   BMI 32.27 kg/m   Physical Exam Vitals and nursing note reviewed.  Constitutional:      General: She is not in acute distress.    Appearance: Normal appearance. She is well-developed. She is obese. She is not ill-appearing, toxic-appearing or diaphoretic.  HENT:     Head: Normocephalic and atraumatic.     Right Ear: External ear normal.  Left Ear: External ear normal.     Nose: Nose normal.     Mouth/Throat:     Mouth: Mucous membranes are moist.     Pharynx: Oropharynx is clear.  Eyes:     General: No scleral icterus.    Extraocular Movements: Extraocular movements intact.     Conjunctiva/sclera: Conjunctivae normal.     Pupils: Pupils are equal, round, and reactive to light.  Cardiovascular:     Rate and Rhythm: Normal rate and regular rhythm.     Heart sounds: No murmur heard. Pulmonary:     Effort: Pulmonary effort is normal. No respiratory distress.     Breath sounds: Normal breath sounds.  Abdominal:     General: There is no distension.     Palpations: Abdomen is soft.     Tenderness: There is no abdominal tenderness.  Musculoskeletal:        General: Normal range of motion.     Cervical back: Normal range of motion and neck supple. No rigidity or tenderness.     Right lower leg: No edema.     Left lower leg: No edema.  Lymphadenopathy:     Cervical: No cervical adenopathy.  Skin:    General: Skin is warm and dry.     Capillary Refill: Capillary refill takes less  than 2 seconds.  Neurological:     General: No focal deficit present.     Mental Status: She is alert and oriented to person, place, and time.     GCS: GCS eye subscore is 4. GCS verbal subscore is 5. GCS motor subscore is 6.     Cranial Nerves: Cranial nerves 2-12 are intact. No cranial nerve deficit, dysarthria or facial asymmetry.     Sensory: Sensation is intact.     Motor: Weakness present. No tremor, atrophy, abnormal muscle tone or seizure activity.     Coordination: Coordination is intact. Finger-Nose-Finger Test normal.     Comments: Baseline left-sided deficits, unchanged from prior  Psychiatric:        Attention and Perception: She is attentive. She perceives visual hallucinations. She does not perceive auditory hallucinations.        Mood and Affect: Mood and affect normal.        Speech: Speech normal.        Behavior: Behavior is cooperative.        Thought Content: Thought content normal.        Cognition and Memory: Cognition and memory normal.     Comments: Noted to be talking to unseen persons    ED Results / Procedures / Treatments   Labs (all labs ordered are listed, but only abnormal results are displayed) Labs Reviewed  COMPREHENSIVE METABOLIC PANEL - Abnormal; Notable for the following components:      Result Value   Potassium 3.3 (*)    Glucose, Bld 108 (*)    All other components within normal limits  SALICYLATE LEVEL - Abnormal; Notable for the following components:   Salicylate Lvl <7.0 (*)    All other components within normal limits  ACETAMINOPHEN LEVEL - Abnormal; Notable for the following components:   Acetaminophen (Tylenol), Serum <10 (*)    All other components within normal limits  AMMONIA - Abnormal; Notable for the following components:   Ammonia 39 (*)    All other components within normal limits  URINALYSIS, ROUTINE W REFLEX MICROSCOPIC - Abnormal; Notable for the following components:   APPearance HAZY (*)    Hgb  urine dipstick SMALL (*)     All other components within normal limits  CBC WITH DIFFERENTIAL/PLATELET  ETHANOL  RAPID URINE DRUG SCREEN, HOSP PERFORMED  TSH  MAGNESIUM  CBG MONITORING, ED    EKG EKG Interpretation  Date/Time:  Friday April 08 2021 17:22:46 EDT Ventricular Rate:  91 PR Interval:  146 QRS Duration: 140 QT Interval:  388 QTC Calculation: 477 R Axis:   -37 Text Interpretation: Normal sinus rhythm Left axis deviation Right bundle branch block Abnormal ECG Confirmed by Norman Clay ([id number]) on 04/08/2021 10:20:05 PM  Radiology CT HEAD WO CONTRAST ( )  Result Date: 04/08/2021 CLINICAL DATA:  Mental status change, unknown cause EXAM: CT HEAD WITHOUT CONTRAST TECHNIQUE: Contiguous axial images were obtained from the base of the skull through the vertex without intravenous contrast. COMPARISON:  March 26, 2021. FINDINGS: Brain: No evidence of acute large vascular territory infarction, hemorrhage, hydrocephalus, extra-axial collection or mass lesion/mass effect. Similar remote infarcts in the pons, left thalamus, left corona radiata. Similar sequela of prior hemorrhage in the right thalamus. Vascular: No hyperdense vessel identified. Skull: No acute fracture. Sinuses/Orbits: Visualized sinuses are clear. Other: No mastoid effusions. IMPRESSION: 1. No evidence of acute intracranial abnormality. 2. Sequela prior right thalamic hemorrhage. 3. Remote pontine, left basal ganglia, and left corona radiata infarcts. Electronically Signed   By: Feliberto Harts M.D.   On: 04/08/2021 19:15    Procedures Procedures   Medications Ordered in ED Medications  carvedilol (COREG) tablet 25 mg (25 mg Oral Given 04/08/21 2333)    ED Course  I have reviewed the triage vital signs and the nursing notes.  Pertinent labs & imaging results that were available during my care of the patient were reviewed by me and considered in my medical decision making (see chart for details).    MDM Rules/Calculators/A&P                            Aolanis Pinson is a 55 y.o. female presenting with hallucinations. Initial VS sig for HTN.  EKG interpretation: NSR, rate 91 bpm, normal intervals, ST depression in lead II in leads V4 through V6, RBBB present on prior EKGs.  No ST elevations or T wave inversions.  Labs: UA with small hematuria.  UDS, mag, and TSH pending.  Ammonia of 39.  EtOH and coingestion labs unremarkable.  CMP with mild hypokalemia, otherwise CMP and CBC unremarkable.  Imaging: CT head unremarkable. Imaging was reviewed by radiology and personally by me.  DDX considered: Meningitis, head trauma, CVA, hemorrhagic stroke, thyroid dysfunction, drug intoxication or withdrawal, hepatic encephalopathy, psychosis, sepsis, UTI. History, examination, and objective data most consistent with encephalopathy and new visual hallucinations of unclear etiology.  Low suspicion for CVA or hemorrhagic stroke based on imaging and nonfocal neurologic exam with unchanged deficits from prior.  No smell of alcohol or history concerning for substance intoxication or withdrawal.  No specific toxidrome noted.  Patient afebrile and without systemic infectious signs or symptoms.  No rash or meningismus.  No signs of trauma.  UA inconsistent with infectious process.  No other localizing infectious etiology identified.  Medications: Medications  carvedilol (COREG) tablet 25 mg (25 mg Oral Given 04/08/21 2333)    Patient noted on multiple evaluations to be speaking to or interacting with persons not in the room, such as stating that her son was standing by the door when no one was in the hallway.  Patient states she is under "  a lot of stress" recently.  Re-evaluated prior to admission. Hemodynamically stable and in no acute distress.  Given home BP meds with moderate improvement HTN, appears near prior BP baseline at other ED visits recently.  Mental status is waxing and waning while in ED. Protecting airway well.  Admitted to hospitalist  in stable condition for further work-up of encephalopathy monitoring given lack of return to neurologic baseline. Patient understands and agrees with the plan the best of her ability.  Unable to contact family, given that only contact person listed as patient's niece, who is a child.  Patient does not know any adult family members' contact information.   The plan for this patient was discussed with my attending physician, who voiced agreement and who oversaw evaluation and treatment of this patient.     Note: Chief Executive Officer was used in the creation of this note.  Final Clinical Impression(s) / ED Diagnoses Final diagnoses:  Encephalopathy    Rx / DC Orders ED Discharge Orders     None        Dwaine Gale, DO 04/09/21 0112    Dwaine Gale, DO 04/09/21 0118    Cheryll Cockayne, MD 04/13/21 [phone]

## 2021-04-08 NOTE — ED Notes (Signed)
Pt states "Im talking to my sister, see shes right here."

## 2021-04-08 NOTE — ED Triage Notes (Signed)
Patient BIB [facility] from home for evaluation hallucinations. On arrival to patient's home she pointed to the corner of the room and made a comment regarding a number of large insects and rotting flesh that she said were visible in the corner at one moment and then disappeared. Patient is otherwise alert, oriented, and in no apparent distress at this time.   138/80 Hr 88 SpO2 98%

## 2021-04-08 NOTE — Telephone Encounter (Signed)
Nino Glow is calling back- requesting nurse evaluation for patient for disease process management for next week.  Needs order: [facility] Fax: [phone]  Patient has been taken to ED for assessment.

## 2021-04-08 NOTE — Telephone Encounter (Signed)
Please share forms once back in office Monday for faxing completion.

## 2021-04-08 NOTE — Telephone Encounter (Signed)
PT is calling to report sudden change in patient behavior. Patient seemed fine at arrival- patient was eating her lunch- but as they stated to do PT- patient started to see things- bugs and is very confused.Taking her sock off- thinks something is on her socks and thinks things are falling from the ceiling. Advised ED- they will call 911 for transport.

## 2021-04-08 NOTE — Telephone Encounter (Signed)
Reassessment- maggot on floor, something on her socks, things falling down. Refusing EMS Reason for Disposition  Very strange or paranoid behavior  Answer Assessment - Initial Assessment Questions 1. LEVEL OF CONSCIOUSNESS: "How is he (she, the patient) acting right now?" (e.g., alert-oriented, confused, lethargic, stuporous, comatose)     confused 2. ONSET: "When did the confusion start?"  (minutes, hours, days)     Patient had finished lunch- and she was fine- then all the sudden she got confused 3. PATTERN "Does this come and go, or has it been constant since it started?"  "Is it present now?"     Constant- present now 4. ALCOHOL or DRUGS: "Has he been drinking alcohol or taking any drugs?"      no 5. NARCOTIC MEDICATIONS: "Has he been receiving any narcotic medications?" (e.g., morphine, Vicodin)     no 6. CAUSE: "What do you think is causing the confusion?"      unsure 7. OTHER SYMPTOMS: "Are there any other symptoms?" (e.g., difficulty breathing, headache, fever, weakness)     weakness  Protocols used: Confusion - Delirium-A-AH

## 2021-04-08 NOTE — ED Provider Notes (Signed)
Emergency Medicine Provider Triage Evaluation Note  Shirley Hansen , a 55 y.o. female  was evaluated in triage.  Pt complains of hallucinations.  She states she had recent medication changes.  She reportedly had a change today and has been reportedly stating that there was rotting flesh and maggots.  Unclear last seen normal. She has been observed to be talking to her sister who isn't there.  She denies any new pain or weakness.   Chart review doesn't show a history of hallucinations.   Review of Systems  Positive: Hallucinations Negative: Fevers  Physical Exam  BP (!) 161/101   Pulse 91   Temp 98.2 F (36.8 C)   Resp 18   SpO2 100%  Gen:   Awake, no distress   Resp:  Normal effort  MSK:   Moves extremities without difficulty  Other:  Has been observed to be speaking with her sister who isn't in the room.  Responding to internal stimuli.   Medical Decision Making  Medically screening exam initiated at 4:47 PM.  Appropriate orders placed.  Kieryn Burtis was informed that the remainder of the evaluation will be completed by another provider, this initial triage assessment does not replace that evaluation, and the importance of remaining in the ED until their evaluation is complete.     Cristina Gong, PA-C 04/08/21 1704    Milagros Loll, MD 04/09/21 [phone]

## 2021-04-09 ENCOUNTER — Other Ambulatory Visit: Payer: Self-pay

## 2021-04-09 ENCOUNTER — Encounter (HOSPITAL_COMMUNITY): Payer: Self-pay | Admitting: Internal Medicine

## 2021-04-09 DIAGNOSIS — I160 Hypertensive urgency: Secondary | ICD-10-CM | POA: Diagnosis present

## 2021-04-09 DIAGNOSIS — G9340 Encephalopathy, unspecified: Secondary | ICD-10-CM

## 2021-04-09 LAB — RAPID URINE DRUG SCREEN, HOSP PERFORMED
Amphetamines: NOT DETECTED
Barbiturates: NOT DETECTED
Benzodiazepines: NOT DETECTED
Cocaine: NOT DETECTED
Opiates: NOT DETECTED
Tetrahydrocannabinol: NOT DETECTED

## 2021-04-09 LAB — URINALYSIS, ROUTINE W REFLEX MICROSCOPIC
Bacteria, UA: NONE SEEN
Bilirubin Urine: NEGATIVE
Glucose, UA: NEGATIVE mg/dL
Ketones, ur: NEGATIVE mg/dL
Leukocytes,Ua: NEGATIVE
Nitrite: NEGATIVE
Protein, ur: NEGATIVE mg/dL
Specific Gravity, Urine: 1.021 (ref 1.005–1.030)
pH: 5 (ref 5.0–8.0)

## 2021-04-09 LAB — BASIC METABOLIC PANEL
Anion gap: 8 (ref 5–15)
BUN: 8 mg/dL (ref 6–20)
CO2: 26 mmol/L (ref 22–32)
Calcium: 9 mg/dL (ref 8.9–10.3)
Chloride: 106 mmol/L (ref 98–111)
Creatinine, Ser: 0.82 mg/dL (ref 0.44–1.00)
GFR, Estimated: >60 mL/min (ref >60)
Glucose, Bld: 88 mg/dL (ref 70–99)
Potassium: 3.4 mmol/L — ABNORMAL LOW (ref 3.5–5.1)
Sodium: 140 mmol/L (ref 135–145)

## 2021-04-09 LAB — CBC
HCT: 37.4 % (ref 36.0–46.0)
Hemoglobin: 12.1 g/dL (ref 12.0–15.0)
MCH: 29.2 pg (ref 26.0–34.0)
MCHC: 32.4 g/dL (ref 30.0–36.0)
MCV: 90.3 fL (ref 80.0–100.0)
Platelets: 337 10*3/uL (ref 150–400)
RBC: 4.14 MIL/uL (ref 3.87–5.11)
RDW: 14.2 % (ref 11.5–15.5)
WBC: 8.1 10*3/uL (ref 4.0–10.5)
nRBC: 0 % (ref 0.0–0.2)

## 2021-04-09 LAB — RESP PANEL BY RT-PCR (FLU A&B, COVID) ARPGX2
Influenza A by PCR: NEGATIVE
Influenza B by PCR: NEGATIVE
SARS Coronavirus 2 by RT PCR: NEGATIVE

## 2021-04-09 MED ORDER — LATANOPROST 0.005 % OP SOLN
1.0000 [drp] | Freq: Every day | OPHTHALMIC | Status: DC
  Administered 2021-04-09: 1 [drp] via OPHTHALMIC
  Filled 2021-04-09: qty 2.5

## 2021-04-09 MED ORDER — POLYVINYL ALCOHOL 1.4 % OP SOLN
1.0000 [drp] | Freq: Every day | OPHTHALMIC | Status: DC | PRN
Start: 2021-04-09 — End: 2021-04-10

## 2021-04-09 MED ORDER — CARVEDILOL 12.5 MG PO TABS
25.0000 mg | ORAL_TABLET | Freq: Two times a day (BID) | ORAL | Status: DC
  Administered 2021-04-09 – 2021-04-10 (×3): 25 mg via ORAL
  Filled 2021-04-09 (×3): qty 2

## 2021-04-09 MED ORDER — BRIMONIDINE TARTRATE 0.2 % OP SOLN
1.0000 [drp] | Freq: Two times a day (BID) | OPHTHALMIC | Status: DC
  Administered 2021-04-09 – 2021-04-10 (×2): 1 [drp] via OPHTHALMIC
  Filled 2021-04-09: qty 5

## 2021-04-09 MED ORDER — ACETAMINOPHEN 650 MG RE SUPP: 650.0000 mg | Freq: Four times a day (QID) | RECTAL | Status: DC | PRN

## 2021-04-09 MED ORDER — MECLIZINE HCL 12.5 MG PO TABS: 25.0000 mg | ORAL_TABLET | Freq: Three times a day (TID) | ORAL | Status: DC | PRN

## 2021-04-09 MED ORDER — ATORVASTATIN CALCIUM 80 MG PO TABS
80.0000 mg | ORAL_TABLET | Freq: Every day | ORAL | Status: DC
  Administered 2021-04-09: 80 mg via ORAL
  Filled 2021-04-09: qty 1

## 2021-04-09 MED ORDER — POTASSIUM CHLORIDE CRYS ER 20 MEQ PO TBCR
20.0000 meq | EXTENDED_RELEASE_TABLET | Freq: Once | ORAL | Status: AC
  Administered 2021-04-09: 20 meq via ORAL
  Filled 2021-04-09: qty 1

## 2021-04-09 MED ORDER — DULOXETINE HCL 60 MG PO CPEP
60.0000 mg | ORAL_CAPSULE | Freq: Every day | ORAL | Status: DC
  Administered 2021-04-09 – 2021-04-10 (×2): 60 mg via ORAL
  Filled 2021-04-09 (×2): qty 1

## 2021-04-09 MED ORDER — ASPIRIN EC 81 MG PO TBEC
81.0000 mg | DELAYED_RELEASE_TABLET | Freq: Every day | ORAL | Status: DC
  Administered 2021-04-09 – 2021-04-10 (×2): 81 mg via ORAL
  Filled 2021-04-09 (×2): qty 1

## 2021-04-09 MED ORDER — NETARSUDIL DIMESYLATE 0.02 % OP SOLN: 1.0000 [drp] | Freq: Every day | OPHTHALMIC | Status: DC

## 2021-04-09 MED ORDER — FAMOTIDINE 20 MG PO TABS
20.0000 mg | ORAL_TABLET | Freq: Two times a day (BID) | ORAL | Status: DC
  Administered 2021-04-09 – 2021-04-10 (×4): 20 mg via ORAL
  Filled 2021-04-09 (×4): qty 1

## 2021-04-09 MED ORDER — ESCITALOPRAM OXALATE 10 MG PO TABS
5.0000 mg | ORAL_TABLET | Freq: Every day | ORAL | Status: DC
  Administered 2021-04-09 – 2021-04-10 (×2): 5 mg via ORAL
  Filled 2021-04-09 (×2): qty 1

## 2021-04-09 MED ORDER — AMLODIPINE BESYLATE 5 MG PO TABS
5.0000 mg | ORAL_TABLET | Freq: Every day | ORAL | Status: DC
  Administered 2021-04-09 – 2021-04-10 (×2): 5 mg via ORAL
  Filled 2021-04-09 (×3): qty 1

## 2021-04-09 MED ORDER — DORZOLAMIDE HCL-TIMOLOL MAL 2-0.5 % OP SOLN
1.0000 [drp] | Freq: Two times a day (BID) | OPHTHALMIC | Status: DC
  Administered 2021-04-09 – 2021-04-10 (×2): 1 [drp] via OPHTHALMIC
  Filled 2021-04-09: qty 10

## 2021-04-09 MED ORDER — ACETAMINOPHEN 325 MG PO TABS: 650.0000 mg | ORAL_TABLET | Freq: Four times a day (QID) | ORAL | Status: DC | PRN

## 2021-04-09 MED ORDER — ENOXAPARIN SODIUM 40 MG/0.4ML IJ SOSY
40.0000 mg | PREFILLED_SYRINGE | INTRAMUSCULAR | Status: DC
  Administered 2021-04-09 – 2021-04-10 (×2): 40 mg via SUBCUTANEOUS
  Filled 2021-04-09 (×2): qty 0.4

## 2021-04-09 MED ORDER — HYDRALAZINE HCL 20 MG/ML IJ SOLN: 10.0000 mg | INTRAMUSCULAR | Status: DC | PRN

## 2021-04-09 MED ORDER — AMITRIPTYLINE HCL 25 MG PO TABS
25.0000 mg | ORAL_TABLET | Freq: Every day | ORAL | Status: DC
  Administered 2021-04-09: 25 mg via ORAL
  Filled 2021-04-09 (×2): qty 1

## 2021-04-09 NOTE — H&P (Signed)
History and Physical    Shirley Hansen ONG:[id number] DOB: 16-Apr-1966 DOA: 04/08/2021  PCP: Georganna Skeans, MD  Patient coming from: Home.  Chief Complaint: Hallucination.  HPI: Shirley Hansen is a 55 y.o. female with history of CVA with left-sided hemiplegia recently admitted for weakness on the left side work-up was largely unremarkable patient was found to be orthostatic and some of the blood pressure medications were discontinued at that time had followed up with her primary care physician and her baclofen and gabapentin was discontinued patient was started on Flexeril and Lyrica.  Patient's family noticed that patient hallucinating with seeing ants.  EMS was called patient was brought to the ER.  ED Course: In the ER patient's nurse also noted that patient was talking about some insects of the corner of the bed.  CT unremarkable patient is afebrile.  Patient blood pressure is markedly elevated with systolic blood pressures in the 185 diastolic 110.  Labs show potassium of 3.3 CBC unremarkable, ammonia 39 urine drug screen negative.  UA unremarkable.  Patient admitted for acute encephalopathy/hallucination could be medication related.  Review of Systems: As per HPI, rest all negative.   Past Medical History:  Diagnosis Date   Hypertension    Pancreatitis    Stage 3a chronic kidney disease (HCC)    Stroke [facility])     Past Surgical History:  Procedure Laterality Date   TUBAL LIGATION       reports that she has quit smoking. She has never used smokeless tobacco. She reports that she does not drink alcohol and does not use drugs.  No Known Allergies  History reviewed. No pertinent family history.  Prior to Admission medications   Medication Sig Start Date End Date Taking? Authorizing Provider  acetaminophen (TYLENOL) 325 MG tablet Take 650 mg by mouth every 6 (six) hours as needed for mild pain.   Yes [provider]  albuterol (VENTOLIN HFA) 108 (90 Base) MCG/ACT inhaler  Inhale 2 puffs into the lungs every 2 (two) hours as needed for wheezing or shortness of breath. 07/28/20  Yes Ghimire, Werner Lean, MD  amitriptyline (ELAVIL) 25 MG tablet Take 25 mg by mouth at bedtime. 02/07/21 02/07/22 Yes [provider]  Artificial Tear Solution (GENTEAL TEARS OP) Place 1 drop into both eyes daily as needed (dry eyes).   Yes [provider]  aspirin EC 81 MG tablet Take 81 mg by mouth daily. Swallow whole.   Yes [provider]  atorvastatin (LIPITOR) 80 MG tablet Take 80 mg by mouth at bedtime. 02/07/21 02/07/22 Yes [provider]  brimonidine (ALPHAGAN) 0.2 % ophthalmic solution Place 1 drop into both eyes 2 (two) times daily. 06/26/20  Yes [provider]  carvedilol (COREG) 25 MG tablet Take 1 tablet (25 mg total) by mouth 2 (two) times daily. 04/05/21 05/05/21 Yes Mayers, Cari S, PA-C  cyclobenzaprine (FLEXERIL) 10 MG tablet Take 1 tablet (10 mg total) by mouth 3 (three) times daily as needed for muscle spasms. Patient taking differently: Take 10 mg by mouth 3 (three) times daily. 04/05/21  Yes Mayers, Cari S, PA-C  diclofenac Sodium (VOLTAREN) 1 % GEL Apply 1 application topically 4 (four) times daily as needed (pain). 05/20/20  Yes [provider]  dorzolamide-timolol (COSOPT) 22.3-6.8 MG/ML ophthalmic solution Place 1 drop into both eyes 2 (two) times daily. 05/31/20  Yes [provider]  DULoxetine (CYMBALTA) 60 MG capsule Take 60 mg by mouth daily. 02/07/21 02/07/22 Yes [provider]  escitalopram (  LEXAPRO) 5 MG tablet Take 5 mg by mouth daily. 01/28/21  Yes [provider]  famotidine (PEPCID) 20 MG tablet Take 20 mg by mouth 2 (two) times daily.   Yes [provider]  latanoprost (XALATAN) 0.005 % ophthalmic solution Place 1 drop into both eyes at bedtime. 06/17/20  Yes [provider]  meclizine (ANTIVERT) 25 MG tablet Take 1 tablet (25 mg total) by mouth 3 (three) times daily as  needed for dizziness. 02/22/21  Yes Linwood Dibbles, MD  Netarsudil Dimesylate 0.02 % SOLN Place 1 drop into both eyes at bedtime. 02/07/21  Yes [provider]  pregabalin (LYRICA) 50 MG capsule Take 1 capsule (50 mg total) by mouth 2 (two) times daily. 04/05/21  Yes Mayers, Kasandra Knudsen, PA-C    Physical Exam: Constitutional: Moderately built and nourished. Vitals:   04/08/21 2345 04/09/21 0030 04/09/21 0045 04/09/21 0215  BP: (!) 155/95 (!) 174/118 (!) 153/98 (!) 173/116  Pulse: 93 88 94 87  Resp:   (!) 21 16  Temp:      TempSrc:      SpO2: 99% 100% 99% 99%  Weight:      Height:       Eyes: Anicteric no pallor. ENMT: No discharge from the ears eyes nose and mouth. Neck: No mass felt.  No neck rigidity. Respiratory: No rhonchi or crepitations. Cardiovascular: S1-S2 heard. Abdomen: Soft nontender bowel sound present. Musculoskeletal: No edema. Skin: No rash. Neurologic: Alert awake oriented to time place and person.  Left-sided weakness from previous stroke. Psychiatric: Oriented to time place and person.   Labs on Admission: I have personally reviewed following labs and imaging studies  CBC: Recent Labs  Lab 04/04/21 2053 04/08/21 1702  WBC 8.0 7.6  NEUTROABS 4.9 4.6  HGB 13.0 13.4  HCT 40.6 41.3  MCV 91.0 90.8  PLT 370 356   Basic Metabolic Panel: Recent Labs  Lab 04/04/21 2053 04/08/21 1702  NA 139 137  K 3.3* 3.3*  CL 102 101  CO2 28 29  GLUCOSE 87 108*  BUN 10 9  CREATININE 0.86 0.78  CALCIUM 9.0 8.9  MG 1.9  --    GFR: Estimated Creatinine Clearance: 83.9 mL/min (by C-G formula based on SCr of 0.78 mg/dL). Liver Function Tests: Recent Labs  Lab 04/08/21 1702  AST 22  ALT 24  ALKPHOS 79  BILITOT 0.6  PROT 7.1  ALBUMIN 3.7   No results for input(s): LIPASE, AMYLASE in the last 168 hours. Recent Labs  Lab 04/08/21 2243  AMMONIA 39*   Coagulation Profile: No results for input(s): INR, PROTIME in the last 168 hours. Cardiac Enzymes: No  results for input(s): CKTOTAL, CKMB, CKMBINDEX, TROPONINI in the last 168 hours. BNP (last 3 results) No results for input(s): PROBNP in the last 8760 hours. HbA1C: No results for input(s): HGBA1C in the last 72 hours. CBG: Recent Labs  Lab 04/04/21 2048 04/08/21 2348  GLUCAP 88 82   Lipid Profile: No results for input(s): CHOL, HDL, LDLCALC, TRIG, CHOLHDL, LDLDIRECT in the last 72 hours. Thyroid Function Tests: No results for input(s): TSH, T4TOTAL, FREET4, T3FREE, THYROIDAB in the last 72 hours. Anemia Panel: No results for input(s): VITAMINB12, FOLATE, FERRITIN, TIBC, IRON, RETICCTPCT in the last 72 hours. Urine analysis:    Component Value Date/Time   COLORURINE YELLOW 04/09/2021 0030   APPEARANCEUR HAZY (A) 04/09/2021 0030   LABSPEC 1.021 04/09/2021 0030   PHURINE 5.0 04/09/2021 0030   GLUCOSEU NEGATIVE 04/09/2021 0030  HGBUR SMALL (A) 04/09/2021 0030   BILIRUBINUR NEGATIVE 04/09/2021 0030   KETONESUR NEGATIVE 04/09/2021 0030   PROTEINUR NEGATIVE 04/09/2021 0030   NITRITE NEGATIVE 04/09/2021 0030   LEUKOCYTESUR NEGATIVE 04/09/2021 0030   Sepsis Labs: @LABRCNTIP (procalcitonin:4,lacticidven:4) )No results found for this or any previous visit (from the past 240 hour(s)).   Radiological Exams on Admission: CT HEAD WO CONTRAST ( )  Result Date: 04/08/2021 CLINICAL DATA:  Mental status change, unknown cause EXAM: CT HEAD WITHOUT CONTRAST TECHNIQUE: Contiguous axial images were obtained from the base of the skull through the vertex without intravenous contrast. COMPARISON:  March 26, 2021. FINDINGS: Brain: No evidence of acute large vascular territory infarction, hemorrhage, hydrocephalus, extra-axial collection or mass lesion/mass effect. Similar remote infarcts in the pons, left thalamus, left corona radiata. Similar sequela of prior hemorrhage in the right thalamus. Vascular: No hyperdense vessel identified. Skull: No acute fracture. Sinuses/Orbits: Visualized sinuses  are clear. Other: No mastoid effusions. IMPRESSION: 1. No evidence of acute intracranial abnormality. 2. Sequela prior right thalamic hemorrhage. 3. Remote pontine, left basal ganglia, and left corona radiata infarcts. Electronically Signed   By: March 28, 2021 M.D.   On: 04/08/2021 19:15    EKG: Independently reviewed.  Normal sinus rhythm.  RBBB.  Assessment/Plan Principal Problem:   Acute encephalopathy Active Problems:   Hemiparesis affecting left side as late effect of cerebrovascular accident (CVA) (HCC)   Hypertensive urgency    Hallucinations/acute encephalopathy could be from recent change in medications.  I am holding off patient's Lyrica and Flexeril for now.  We will closely monitor.  Could be also from uncontrolled blood pressure causing hypertensive encephalopathy. Hypertensive urgency -patient has been taking Coreg.  Patient's antihypertensives were recently discontinued during recent admission when patient was found to be orthostatic.  I will restart patient's amlodipine follow blood pressure trends closely. Prior history of CVA with left-sided hemiplegia continue statins and aspirin.  History of CAD denies any chest pain. Mild hypokalemia replace and recheck.   DVT prophylaxis: Lovenox. Code Status: Full code. Family Communication: Discussed with patient. Disposition Plan: Home. Consults called: None. Admission status: Observation.   04/10/2021 MD [facility] Pager [phone].  If 7PM-7AM, please contact night-coverage [email or web address] Password [email or web address]  04/09/2021, 3:29 AM

## 2021-04-09 NOTE — Progress Notes (Signed)
Patient admitted earlier this morning.  H&P reviewed.  Patient seen and examined.  Patient mentions that she is no longer seeing any spiders on the wall or ants on the floor.  Not seeing any other person in the room apart from the both of [name].  Denies any headaches.  She mentioned that she was taken off of baclofen because it was making her dizzy.  She was switched over to Flexeril.  Looks like she was also started on Lyrica which replaced gabapentin.  Vital signs noted to be better this morning.  Blood pressure seems to have improved. Lungs are clear to auscultation bilaterally S1S2 is normal regular Abdomen is soft nontender nondistended She has left-sided weakness from previous stroke.  No new neurological deficits noted.  Potassium noted to be 3.4.  Other labs are unremarkable.  UA did not suggest infection.  Urine drug screen was unremarkable.  Patient's encephalopathy and hallucinations likely due to medication along with poorly controlled blood pressure.  No obvious new neurological deficits noted.  CT head did not show any new findings.  Lyrica and Flexeril have been held.  Blood pressure was also quite elevated yesterday and seems to be better controlled today.  She is currently on amlodipine and carvedilol.  No infectious etiology is found.  Continue to monitor.  If patient continues to improve anticipate discharge tomorrow. We will replace her potassium.  Osvaldo Shipper 04/09/2021

## 2021-04-10 DIAGNOSIS — G9340 Encephalopathy, unspecified: Secondary | ICD-10-CM | POA: Diagnosis not present

## 2021-04-10 LAB — BASIC METABOLIC PANEL
Anion gap: 7 (ref 5–15)
BUN: 7 mg/dL (ref 6–20)
CO2: 27 mmol/L (ref 22–32)
Calcium: 8.6 mg/dL — ABNORMAL LOW (ref 8.9–10.3)
Chloride: 106 mmol/L (ref 98–111)
Creatinine, Ser: 0.81 mg/dL (ref 0.44–1.00)
GFR, Estimated: >60 mL/min (ref >60)
Glucose, Bld: 145 mg/dL — ABNORMAL HIGH (ref 70–99)
Potassium: 3.2 mmol/L — ABNORMAL LOW (ref 3.5–5.1)
Sodium: 140 mmol/L (ref 135–145)

## 2021-04-10 LAB — MAGNESIUM: Magnesium: 1.9 mg/dL (ref 1.7–2.4)

## 2021-04-10 MED ORDER — PREGABALIN 50 MG PO CAPS: 50.0000 mg | ORAL_CAPSULE | Freq: Two times a day (BID) | ORAL | 1 refills | Status: DC

## 2021-04-10 MED ORDER — POTASSIUM CHLORIDE CRYS ER 20 MEQ PO TBCR
40.0000 meq | EXTENDED_RELEASE_TABLET | ORAL | Status: AC
Start: 2021-04-10 — End: 2021-04-10
  Administered 2021-04-10 (×2): 40 meq via ORAL
  Filled 2021-04-10 (×2): qty 2

## 2021-04-10 MED ORDER — METHOCARBAMOL 500 MG PO TABS: 500.0000 mg | ORAL_TABLET | Freq: Three times a day (TID) | ORAL | 0 refills | Status: DC | PRN

## 2021-04-10 MED ORDER — AMLODIPINE BESYLATE 5 MG PO TABS: 5.0000 mg | ORAL_TABLET | Freq: Every day | ORAL | 1 refills | Status: DC

## 2021-04-10 NOTE — Discharge Summary (Signed)
[facility]  Physician Discharge Summary   Patient ID: Shirley Hansen MRN: [id number] DOB/AGE: [id number] 55 y.o.  Admit date: 04/08/2021 Discharge date: 04/10/2021    PCP: Georganna Skeans, MD  DISCHARGE DIAGNOSES:  Hallucinations secondary to medication, resolved Essential hypertension Previous history of stroke History of coronary artery disease   RECOMMENDATIONS FOR OUTPATIENT FOLLOW UP: Follow-up with PCP. Patient has been taken off of Flexeril.  Lyrica may be resumed in 1 week   Home Health: None Equipment/Devices: None  CODE STATUS: Full code  DISCHARGE CONDITION: fair  Diet recommendation: As before  INITIAL HISTORY: Shirley Hansen is a 55 y.o. female with history of CVA with left-sided hemiplegia recently admitted for weakness on the left side work-up was largely unremarkable patient was found to be orthostatic and some of the blood pressure medications were discontinued at that time had followed up with her primary care physician and her baclofen and gabapentin was discontinued patient was started on Flexeril and Lyrica.  Patient's family noticed that patient hallucinating with seeing ants.  EMS was called patient was brought to the ER.   ED Course: In the ER patient's nurse also noted that patient was talking about some insects of the corner of the bed.  CT unremarkable patient is afebrile.  Patient blood pressure is markedly elevated with systolic blood pressures in the 185 diastolic 110.  Labs show potassium of 3.3 CBC unremarkable, ammonia 39 urine drug screen negative.  UA unremarkable.  Patient admitted for acute encephalopathy/hallucination could be medication related.   HOSPITAL COURSE:   Patient's encephalopathy and hallucinations likely due to medication along with poorly controlled blood pressure.  She was recently changed over from baclofen and gabapentin to Flexeril and Lyrica.  Flexeril seems to be the most likely culprit.  We will discontinue Flexeril  for now.  Reasonable to give Lyrica another chance.  Could be resumed in a week.  Patient agreeable with this plan.    Blood pressure was also poorly controlled.  She was started back on her home medications with improvement.  Potassium repleted.  Magnesium 1.9.  Other medical issues are stable.  Obesity Estimated body mass index is 32.27 kg/m as calculated from the following:   Height as of this encounter: 5\' 4"  (1.626 m).   Weight as of this encounter: 85.3 kg.  Patient has improved.  Back to baseline.  Discussed with her niece.  Okay for discharge home today.  PERTINENT LABS:  The results of significant diagnostics from this hospitalization (including imaging, microbiology, ancillary and laboratory) are listed below for reference.    Microbiology: Recent Results (from the past 240 hour(s))  Resp Panel by RT-PCR (Flu A&B, Covid) Nasopharyngeal Swab     Status: None   Collection Time: 04/09/21  3:53 AM   Specimen: Nasopharyngeal Swab; Nasopharyngeal(NP) swabs in vial transport medium  Result Value Ref Range Status   SARS Coronavirus 2 by RT PCR NEGATIVE NEGATIVE Final    Comment: (NOTE) SARS-CoV-2 target nucleic acids are NOT DETECTED.  The SARS-CoV-2 RNA is generally detectable in upper respiratory specimens during the acute phase of infection. The lowest concentration of SARS-CoV-2 viral copies this assay can detect is 138 copies/mL. A negative result does not preclude SARS-Cov-2 infection and should not be used as the sole basis for treatment or other patient management decisions. A negative result may occur with  improper specimen collection/handling, submission of specimen other than nasopharyngeal swab, presence of viral mutation(s) within the areas targeted by this assay, and inadequate number  of viral copies(<138 copies/mL). A negative result must be combined with clinical observations, patient history, and epidemiological information. The expected result is  Negative.  Fact Sheet for Patients:  BloggerCourse.com  Fact Sheet for Healthcare Providers:  SeriousBroker.it  This test is no t yet approved or cleared by the [place] FDA and  has been authorized for detection and/or diagnosis of SARS-CoV-2 by FDA under an Emergency Use Authorization (EUA). This EUA will remain  in effect (meaning this test can be used) for the duration of the COVID-19 declaration under Section 564(b)(1) of the Act, 21 U.S.C.section 360bbb-3(b)(1), unless the authorization is terminated  or revoked sooner.       Influenza A by PCR NEGATIVE NEGATIVE Final   Influenza B by PCR NEGATIVE NEGATIVE Final    Comment: (NOTE) The Xpert Xpress SARS-CoV-2/FLU/RSV plus assay is intended as an aid in the diagnosis of influenza from Nasopharyngeal swab specimens and should not be used as a sole basis for treatment. Nasal washings and aspirates are unacceptable for Xpert Xpress SARS-CoV-2/FLU/RSV testing.  Fact Sheet for Patients: BloggerCourse.com  Fact Sheet for Healthcare Providers: SeriousBroker.it  This test is not yet approved or cleared by the [place] FDA and has been authorized for detection and/or diagnosis of SARS-CoV-2 by FDA under an Emergency Use Authorization (EUA). This EUA will remain in effect (meaning this test can be used) for the duration of the COVID-19 declaration under Section 564(b)(1) of the Act, 21 U.S.C. section 360bbb-3(b)(1), unless the authorization is terminated or revoked.  Performed at [facility], [address], [place], [place] [address]      Labs:  COVID-19 Labs   Lab Results  Component Value Date   SARSCOV2NAA NEGATIVE 04/09/2021   SARSCOV2NAA NEGATIVE 03/05/2021   SARSCOV2NAA NEGATIVE 02/17/2021   SARSCOV2NAA POSITIVE (A) 07/05/2020      Basic Metabolic Panel: Recent Labs  Lab 04/04/21 2053  04/08/21 1702 04/09/21 0327 04/10/21 0343  NA 139 137 140 140  K 3.3* 3.3* 3.4* 3.2*  CL 102 101 106 106  CO2 28 29 26 27   GLUCOSE 87 108* 88 145*  BUN 10 9 8 7   CREATININE 0.86 0.78 0.82 0.81  CALCIUM 9.0 8.9 9.0 8.6*  MG 1.9  --   --  1.9   Liver Function Tests: Recent Labs  Lab 04/08/21 1702  AST 22  ALT 24  ALKPHOS 79  BILITOT 0.6  PROT 7.1  ALBUMIN 3.7    Recent Labs  Lab 04/08/21 2243  AMMONIA 39*   CBC: Recent Labs  Lab 04/04/21 2053 04/08/21 1702 04/09/21 0327  WBC 8.0 7.6 8.1  NEUTROABS 4.9 4.6  --   HGB 13.0 13.4 12.1  HCT 40.6 41.3 37.4  MCV 91.0 90.8 90.3  PLT 370 356 337     CBG: Recent Labs  Lab 04/04/21 2048 04/08/21 2348  GLUCAP 88 82     IMAGING STUDIES DG Chest 2 View  Result Date: 03/26/2021 CLINICAL DATA:  Chest tightness and bibasilar crackles EXAM: CHEST - 2 VIEW COMPARISON:  03/05/2021 FINDINGS: Persistent but improved, now patchy density at the right lung base. Lungs are otherwise clear. No pleural effusion. Stable cardiomediastinal contours. No acute osseous abnormality. IMPRESSION: Improved right basilar aeration with residual patchy opacity. Electronically Signed   By: 03/28/2021 M.D.   On: 03/26/2021 09:43   CT HEAD WO CONTRAST ([place])  Result Date: 04/08/2021 CLINICAL DATA:  Mental status change, unknown cause EXAM: CT HEAD WITHOUT CONTRAST TECHNIQUE: Contiguous  axial images were obtained from the base of the skull through the vertex without intravenous contrast. COMPARISON:  March 26, 2021. FINDINGS: Brain: No evidence of acute large vascular territory infarction, hemorrhage, hydrocephalus, extra-axial collection or mass lesion/mass effect. Similar remote infarcts in the pons, left thalamus, left corona radiata. Similar sequela of prior hemorrhage in the right thalamus. Vascular: No hyperdense vessel identified. Skull: No acute fracture. Sinuses/Orbits: Visualized sinuses are clear. Other: No mastoid effusions.  IMPRESSION: 1. No evidence of acute intracranial abnormality. 2. Sequela prior right thalamic hemorrhage. 3. Remote pontine, left basal ganglia, and left corona radiata infarcts. Electronically Signed   By: Feliberto Harts M.D.   On: 04/08/2021 19:15   CT HEAD WO CONTRAST ( )  Result Date: 03/26/2021 CLINICAL DATA:  Headache for 2 hours.  Elevated blood pressure. EXAM: CT HEAD WITHOUT CONTRAST TECHNIQUE: Contiguous axial images were obtained from the base of the skull through the vertex without intravenous contrast. COMPARISON:  CT head 03/23/2021 FINDINGS: Brain: No evidence of acute infarction, hemorrhage, hydrocephalus, extra-axial collection or mass lesion/mass effect. There are chronic changes related to right prior thalamic hemorrhage. Small old lacunar infarcts. Vascular: No hyperdense vessel. Skull: Normal. Negative for fracture or focal lesion. Sinuses/Orbits: No acute finding. Other: None. IMPRESSION: No acute intracranial abnormality. Electronically Signed   By: Emmaline Kluver M.D.   On: 03/26/2021 09:32   CT Head Wo Contrast  Result Date: 03/23/2021 CLINICAL DATA:  Neuro deficit, acute, stroke suspected. Mental status change, unknown cause. Hx of CVA w "worse" L side deficits and new hypertensive urgency. Headache. EXAM: CT HEAD WITHOUT CONTRAST TECHNIQUE: Contiguous axial images were obtained from the base of the skull through the vertex without intravenous contrast. COMPARISON:  03/05/2021 FINDINGS: Brain: Normal anatomic configuration. Parenchymal volume loss is commensurate with the patient's age. Mild periventricular white matter changes are present likely reflecting the sequela of small vessel ischemia. Stable remote infarcts within the corona radiata bilaterally, thalami bilaterally, and left pons. No abnormal intra or extra-axial mass lesion or fluid collection. No abnormal mass effect or midline shift. No evidence of acute intracranial hemorrhage or infarct. Ventricular size is  normal. Cerebellum unremarkable. Vascular: No asymmetric hyperdense vasculature at the skull base. Skull: Intact Sinuses/Orbits: Paranasal sinuses are clear. Orbits are unremarkable. Other: Mastoid air cells and middle ear cavities are clear. IMPRESSION: No acute intracranial abnormality. Stable senescent changes. Stable remote infarcts as outlined above. Electronically Signed   By: Helyn Numbers M.D.   On: 03/23/2021 22:37    DISCHARGE EXAMINATION: Vitals:   04/09/21 2040 04/09/21 2335 04/10/21 0424 04/10/21 0744  BP: (!) 150/92 (!) 148/84 (!) 141/97 (!) 133/97  Pulse: 97 94 87 83  Resp: 20 19 19    Temp: 98.6 F (37 C) 98.3 F (36.8 C) 98 F (36.7 C) 98.7 F (37.1 C)  TempSrc: Oral Oral Oral Oral  SpO2: 100% 98% 100% 100%  Weight:      Height:       General appearance: Awake alert.  In no distress Resp: Clear to auscultation bilaterally.  Normal effort Cardio: S1-S2 is normal regular.  No S3-S4.  No rubs murmurs or bruit GI: Abdomen is soft.  Nontender nondistended.  Bowel sounds are present normal.  No masses organomegaly   DISPOSITION: Home  Discharge Instructions     Call MD for:  difficulty breathing, headache or visual disturbances   Complete by: As directed    Call MD for:  extreme fatigue   Complete by: As directed  Call MD for:  persistant dizziness or light-headedness   Complete by: As directed    Call MD for:  persistant nausea and vomiting   Complete by: As directed    Call MD for:  severe uncontrolled pain   Complete by: As directed    Call MD for:  temperature >100.4   Complete by: As directed    Diet - low sodium heart healthy   Complete by: As directed    Discharge instructions   Complete by: As directed    Please stop taking the Flexeril.  You may resume the Lyrica this weekend but seek attention if symptoms recur after you have reinitiated this medication.  Please follow-up with a primary care provider in 1 week.  Follow-up with your  orthopedic  surgeon for continued management of your left shoulder pain.  You were cared for by a hospitalist during your hospital stay. If you have any questions about your discharge medications or the care you received while you were in the hospital after you are discharged, you can call the unit and asked to speak with the hospitalist on call if the hospitalist that took care of you is not available. Once you are discharged, your primary care physician will handle any further medical issues. Please note that NO REFILLS for any discharge medications will be authorized once you are discharged, as it is imperative that you return to your primary care physician (or establish a relationship with a primary care physician if you do not have one) for your aftercare needs so that they can reassess your need for medications and monitor your lab values. If you do not have a primary care physician, you can call [phone] for a physician referral.   Increase activity slowly   Complete by: As directed          Allergies as of 04/10/2021   No Known Allergies      Medication List     STOP taking these medications    cyclobenzaprine 10 MG tablet Commonly known as: FLEXERIL       TAKE these medications    acetaminophen 325 MG tablet Commonly known as: TYLENOL Take 650 mg by mouth every 6 (six) hours as needed for mild pain.   albuterol 108 (90 Base) MCG/ACT inhaler Commonly known as: VENTOLIN HFA Inhale 2 puffs into the lungs every 2 (two) hours as needed for wheezing or shortness of breath.   amitriptyline 25 MG tablet Commonly known as: ELAVIL Take 25 mg by mouth at bedtime.   amLODipine 5 MG tablet Commonly known as: NORVASC Take 1 tablet (5 mg total) by mouth daily.   aspirin EC 81 MG tablet Take 81 mg by mouth daily. Swallow whole.   atorvastatin 80 MG tablet Commonly known as: LIPITOR Take 80 mg by mouth at bedtime.   brimonidine 0.2 % ophthalmic solution Commonly known as:  ALPHAGAN Place 1 drop into both eyes 2 (two) times daily.   carvedilol 25 MG tablet Commonly known as: COREG Take 1 tablet (25 mg total) by mouth 2 (two) times daily.   diclofenac Sodium 1 % Gel Commonly known as: VOLTAREN Apply 1 application topically 4 (four) times daily as needed (pain).   dorzolamide-timolol 22.3-6.8 MG/ML ophthalmic solution Commonly known as: COSOPT Place 1 drop into both eyes 2 (two) times daily.   DULoxetine 60 MG capsule Commonly known as: CYMBALTA Take 60 mg by mouth daily.   escitalopram 5 MG tablet Commonly known as: LEXAPRO Take 5 mg  by mouth daily.   famotidine 20 MG tablet Commonly known as: PEPCID Take 20 mg by mouth 2 (two) times daily.   GENTEAL TEARS OP Place 1 drop into both eyes daily as needed (dry eyes).   latanoprost 0.005 % ophthalmic solution Commonly known as: XALATAN Place 1 drop into both eyes at bedtime.   meclizine 25 MG tablet Commonly known as: ANTIVERT Take 1 tablet (25 mg total) by mouth 3 (three) times daily as needed for dizziness.   methocarbamol 500 MG tablet Commonly known as: Robaxin Take 1 tablet (500 mg total) by mouth every 8 (eight) hours as needed for muscle spasms.   Netarsudil Dimesylate 0.02 % Soln Place 1 drop into both eyes at bedtime.   pregabalin 50 MG capsule Commonly known as: Lyrica Take 1 capsule (50 mg total) by mouth 2 (two) times daily. Resume after few days Start taking on: April 15, 2021 What changed:  additional instructions These instructions start on April 15, 2021. If you are unsure what to do until then, ask your doctor or other care provider.           TOTAL DISCHARGE TIME: 35 minutes  Danie Diehl [employer] on [email or web address]  04/11/2021, 11:01 AM

## 2021-04-10 NOTE — Plan of Care (Signed)

## 2021-04-10 NOTE — Care Management (Signed)
Spoke to patient at bedside. She does not have a way to get home. She states that she can get in out of car, her daughter is at home to assist if needed. She requests taxi voucher, Address confirmed. Voucher given to nurse who will call taxi when ready.

## 2021-04-11 ENCOUNTER — Telehealth: Payer: Self-pay

## 2021-04-11 NOTE — Telephone Encounter (Signed)
Transition Care Management Unsuccessful Follow-up Telephone Call  Date of discharge and from where:  04/10/2021, [facility]   Attempts:  1st Attempt  Reason for unsuccessful TCM follow-up call:  Left voice message on # [phone].   Need to discuss scheduling a follow up appointment with PCP

## 2021-04-12 ENCOUNTER — Telehealth: Payer: Self-pay

## 2021-04-12 NOTE — Telephone Encounter (Signed)
Transition Care Management Unsuccessful Follow-up Telephone Call  Date of discharge and from where:  04/10/2021, [facility]  Attempts:  2nd Attempt  Reason for unsuccessful TCM follow-up call:  Left voice message on # [phone].   Dr Andrey Campanile is listed as PCP but has never seen the patient and does not have an upcoming appointment with the patient. Patient has appointment with Dr Laural Benes scheduled for 06/07/2021.

## 2021-04-13 ENCOUNTER — Telehealth: Payer: Self-pay

## 2021-04-13 NOTE — Telephone Encounter (Signed)
Pt returned call, requesting call back

## 2021-04-13 NOTE — Telephone Encounter (Signed)
Transition Care Management Follow-up Telephone Call Date of discharge and from where: 04/10/2021, [facility]  How have you been since you were released from the hospital? She said she is " trying to get the stiffness out" from being in the hospital Any questions or concerns? Yes - she would like a referral for PCS.    Items Reviewed: Did the pt receive and understand the discharge instructions provided? Yes  Medications obtained and verified? Yes  - she said she has all medications and understands she is to stop taking the flexeril.  The orders for lyrica were reviewed and she repeated order correctly. No other questions about her med regime.  Other? No  Do you have support at home? Yes - her daughter can provide assistance  Home Care and Equipment/Supplies: Were home health services ordered? no If so, what is the name of the agency? N/a  Has the agency set up a time to come to the patient's home? not applicable Were any new equipment or medical supplies ordered?  No What is the name of the medical supply agency? N/a Were you able to get the supplies/equipment? not applicable Do you have any questions related to the use of the equipment or supplies? No  Functional Questionnaire: (I = Independent and D = Dependent) ADLs: independent but needs assistance with showering. Has cane, walker and wheelchair to use when needed  Follow up appointments reviewed:  PCP Hospital f/u appt confirmed? Yes  Scheduled to see Bertram Denver, NP  on 04/20/2021. Will need establish care with PCP. Prefers [facility].  Specialist Hospital f/u appt confirmed? Yes  Scheduled to see neurology on 05/03/2021. Are transportation arrangements needed? No  If their condition worsens, is the pt aware to call PCP or go to the Emergency Dept.? Yes Was the patient provided with contact information for the PCP's office or ED? Yes Was to pt encouraged to call back with questions or concerns? Yes

## 2021-04-20 ENCOUNTER — Encounter: Payer: Self-pay | Admitting: Nurse Practitioner

## 2021-04-20 ENCOUNTER — Other Ambulatory Visit: Payer: Self-pay

## 2021-04-20 ENCOUNTER — Telehealth: Payer: Self-pay

## 2021-04-20 ENCOUNTER — Ambulatory Visit: Payer: Medicaid Other | Attending: Nurse Practitioner | Admitting: Nurse Practitioner

## 2021-04-20 VITALS — BP 142/93 | HR 72 | Ht 64.0 in | Wt 187.0 lb

## 2021-04-20 DIAGNOSIS — I10 Essential (primary) hypertension: Secondary | ICD-10-CM

## 2021-04-20 DIAGNOSIS — I69354 Hemiplegia and hemiparesis following cerebral infarction affecting left non-dominant side: Secondary | ICD-10-CM | POA: Diagnosis not present

## 2021-04-20 DIAGNOSIS — E876 Hypokalemia: Secondary | ICD-10-CM | POA: Diagnosis not present

## 2021-04-20 DIAGNOSIS — Z7689 Persons encountering health services in other specified circumstances: Secondary | ICD-10-CM

## 2021-04-20 DIAGNOSIS — G894 Chronic pain syndrome: Secondary | ICD-10-CM

## 2021-04-20 DIAGNOSIS — Z23 Encounter for immunization: Secondary | ICD-10-CM

## 2021-04-20 DIAGNOSIS — H409 Unspecified glaucoma: Secondary | ICD-10-CM

## 2021-04-20 MED ORDER — AMLODIPINE BESYLATE 10 MG PO TABS
10.0000 mg | ORAL_TABLET | Freq: Every day | ORAL | 1 refills | Status: DC
  Filled 2021-04-20: qty 90, 90d supply, fill #0

## 2021-04-20 MED ORDER — AMITRIPTYLINE HCL 25 MG PO TABS
25.0000 mg | ORAL_TABLET | Freq: Every day | ORAL | 1 refills | Status: DC
  Filled 2021-04-20: qty 30, 30d supply, fill #0

## 2021-04-20 MED ORDER — DULOXETINE HCL 60 MG PO CPEP
60.0000 mg | ORAL_CAPSULE | Freq: Every day | ORAL | 1 refills | Status: DC
  Filled 2021-04-20: qty 30, 30d supply, fill #0

## 2021-04-20 MED ORDER — HYDROCHLOROTHIAZIDE 25 MG PO TABS
12.5000 mg | ORAL_TABLET | Freq: Every day | ORAL | 1 refills | Status: DC
  Filled 2021-04-20: qty 15, 30d supply, fill #0

## 2021-04-20 MED ORDER — ASPIRIN EC 81 MG PO TBEC
81.0000 mg | DELAYED_RELEASE_TABLET | Freq: Every day | ORAL | 3 refills | Status: AC
  Filled 2021-04-20: qty 90, 90d supply, fill #0

## 2021-04-20 NOTE — Telephone Encounter (Signed)
Met with the patient when she was in the clinic today. She explained that she received a call from her insurance company case manager- Anderson Malta # [phone] and was informed that she was eligible for personal care services . Explained to her that a PCS referral can be submitted to [facility] with the request.

## 2021-04-20 NOTE — Progress Notes (Signed)
Assessment & Plan:  Shirley Hansen was seen today for establish care and hospitalization follow-up.  Diagnoses and all orders for this visit:  Encounter to establish care  Chronic pain syndrome -     amitriptyline (ELAVIL) 25 MG tablet; Take 1 tablet (25 mg total) by mouth at bedtime. -     DULoxetine (CYMBALTA) 60 MG capsule; Take 1 capsule (60 mg total) by mouth daily. -     Ambulatory referral to Pain Clinic  Hypokalemia -     Potassium  Need for immunization against influenza -     Flu Vaccine QUAD 22mo+IM (Fluarix, Fluzone & Alfiuria Quad PF)  Primary hypertension -     hydrochlorothiazide (HYDRODIURIL) 25 MG tablet; Take 0.5 tablets (12.5 mg total) by mouth daily. -     amLODipine (NORVASC) 10 MG tablet; Take 1 tablet (10 mg total) by mouth daily.  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 Swallow whole.  Glaucoma of both eyes, unspecified glaucoma type -     Ambulatory referral to Ophthalmology   Patient has been counseled on age-appropriate routine health concerns for screening and prevention. These are reviewed and up-to-date. Referrals have been placed accordingly. Immunizations are up-to-date or declined.    Subjective:   Chief Complaint  Patient presents with   Establish Care   Hospitalization Follow-up   HPI Shirley Hansen 55 y.o. female presents to office today to establish care.  She moved here from [place] a few months ago. Was a patient of [facility] previously however they no longer take her insurance.   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 Was being followed by ENT for this.   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  Using a wheelchair today. At one of her last visits in [place] with her previous PCP in June they  discussed PACE services for her and she was interested at that time. I will have the case manager speak to her about services here now that she is establishing care in [place]. She is requesting PCS services today. Needs home around the home due to left sided hemiparesis. I will have the RN case manager discuss more in detail with her today.   Recently admitted (10-28) to the hospital for acute encephalopathy and hallucinations due to medication incompatibility. Her flexeril has been discontinued. She continues on cymbalta, elavil and lyrica for depression and pain control for muscle spasticity. She does not feel her pain is controlled at this time. I will refer her to pain management.    She need referral for eye specialist for glaucoma   HTN 150-160/100-110. Not well controlled. Will restart HCTZ 12.5 mg daily. Will likely need to increase to 25 mg daily. She will continue on carvedilol 25 mg BID and amlodipine 10 mg daily,  BP Readings from Last 3 Encounters:  04/20/21 (!) 142/93  04/10/21 (!) 133/97  04/05/21 (!) 163/122      Review of Systems  Constitutional:  Negative for fever, malaise/fatigue and weight loss.  HENT: Negative.  Negative for nosebleeds.   Eyes:  Positive for blurred vision. Negative for double vision, photophobia, pain, discharge and redness.  Respiratory: Negative.  Negative for cough and shortness of breath.   Cardiovascular: Negative.  Negative for chest pain, palpitations and leg swelling.  Gastrointestinal: Negative.  Negative for heartburn, nausea and vomiting.  Musculoskeletal:  Positive for myalgias.  Neurological:  Positive for weakness. Negative for dizziness, focal weakness, seizures and headaches.  Psychiatric/Behavioral: Negative.  Negative for suicidal ideas.    Past Medical History:  Diagnosis Date   Hypertension    Pancreatitis    Stage 3a chronic kidney disease ([place])    Stroke [facility])     Past Surgical History:  Procedure Laterality Date    TUBAL LIGATION      History reviewed. No pertinent family history.  Social History Reviewed with no changes to be made today.   Outpatient Medications Prior to Visit  Medication Sig Dispense Refill   acetaminophen (TYLENOL) 325 MG tablet Take 650 mg by mouth every 6 (six) hours as needed for mild pain.     albuterol (VENTOLIN HFA) 108 (90 Base) MCG/ACT inhaler Inhale 2 puffs into the lungs every 2 (two) hours as needed for wheezing or shortness of breath.     Artificial Tear Solution (GENTEAL TEARS OP) Place 1 drop into both eyes daily as needed (dry eyes).     atorvastatin (LIPITOR) 80 MG tablet Take 80 mg by mouth at bedtime.     brimonidine (ALPHAGAN) 0.2 % ophthalmic solution Place 1 drop into both eyes 2 (two) times daily.     carvedilol (COREG) 25 MG tablet Take 1 tablet (25 mg total) by mouth 2 (two) times daily. 60 tablet 0   diclofenac Sodium (VOLTAREN) 1 % GEL Apply 1 application topically 4 (four) times daily as needed (pain).     dorzolamide-timolol (COSOPT) 22.3-6.8 MG/ML ophthalmic solution Place 1 drop into both eyes 2 (two) times daily.     famotidine (PEPCID) 20 MG tablet Take 20 mg by mouth 2 (two) times daily.     latanoprost (XALATAN) 0.005 % ophthalmic solution Place 1 drop into both eyes at bedtime.     meclizine (ANTIVERT) 25 MG tablet Take 1 tablet (25 mg total) by mouth 3 (three) times daily as needed for dizziness. 30 tablet 0   methocarbamol (ROBAXIN) 500 MG tablet Take 1 tablet (500 mg total) by mouth every 8 (eight) hours as needed for muscle spasms. 30 tablet 0   Netarsudil Dimesylate 0.02 % SOLN Place 1 drop into both eyes at bedtime.     pregabalin (LYRICA) 50 MG capsule Take 1 capsule (50 mg total) by mouth 2 (two) times daily. Resume after few days 60 capsule 1   amitriptyline (ELAVIL) 25 MG tablet Take 25 mg by mouth at bedtime.     amLODipine (NORVASC) 5 MG tablet Take 1 tablet (5 mg total) by mouth daily. 30 tablet 1   aspirin EC 81 MG tablet Take 81  mg by mouth daily. Swallow whole.     DULoxetine (CYMBALTA) 60 MG capsule Take 60 mg by mouth daily.     escitalopram (LEXAPRO) 5 MG tablet Take 5 mg by mouth daily.     No facility-administered medications prior to visit.    No Known Allergies     Objective:    BP (!) 142/93   Pulse 72   Ht 5\' 4"  (1.626 m)   Wt 187 lb (84.8 kg)   SpO2 100%   BMI 32.10 kg/m  Wt Readings from Last 3 Encounters:  04/20/21 187 lb (84.8 kg)  04/08/21 188 lb (85.3 kg)  04/04/21 188 lb (85.3 kg)    Physical Exam Vitals and nursing note  reviewed.  Constitutional:      Appearance: She is well-developed.  HENT:     Head: Normocephalic and atraumatic.  Eyes:     General: Visual field deficit present.  Cardiovascular:     Rate and Rhythm: Normal rate and regular rhythm.     Heart sounds: Normal heart sounds. No murmur heard.   No friction rub. No gallop.  Pulmonary:     Effort: Pulmonary effort is normal. No tachypnea or respiratory distress.     Breath sounds: Normal breath sounds. No decreased breath sounds, wheezing, rhonchi or rales.  Chest:     Chest wall: No tenderness.  Abdominal:     General: Bowel sounds are normal.     Palpations: Abdomen is soft.  Musculoskeletal:        General: Normal range of motion.     Cervical back: Normal range of motion.  Skin:    General: Skin is warm and dry.  Neurological:     Mental Status: She is alert and oriented to person, place, and time.     Sensory: Sensory deficit present.     Motor: Weakness present.     Coordination: Coordination normal.     Gait: Gait abnormal.  Psychiatric:        Behavior: Behavior normal. Behavior is cooperative.        Thought Content: Thought content normal.        Judgment: Judgment normal.         Patient has been counseled extensively about nutrition and exercise as well as the importance of adherence with medications and regular follow-up. The patient was given clear instructions to go to ER or return to  medical center if symptoms don't improve, worsen or new problems develop. The patient verbalized understanding.   Follow-up: Return in about 3 weeks (around 05/11/2021) for BP CHECK WITH LUKE 3 weeks. See me in 3 months.   Claiborne Rigg, FNP-BC [facility] [place], [place] [phone]   04/20/2021, 6:39 PM

## 2021-04-21 LAB — POTASSIUM: Potassium: 4 mmol/L (ref 3.5–5.2)

## 2021-04-22 NOTE — Telephone Encounter (Signed)
[place] from [facility] stated they have not received the forms and asked if they could be faxed again, please advise

## 2021-05-02 NOTE — Telephone Encounter (Signed)
PCS referral faxed to [facility] CAC's # [phone].

## 2021-05-03 ENCOUNTER — Ambulatory Visit: Payer: Medicaid Other | Admitting: Neurology

## 2021-05-03 ENCOUNTER — Encounter: Payer: Self-pay | Admitting: Neurology

## 2021-05-03 VITALS — BP 125/85 | HR 91 | Ht 64.0 in | Wt 187.0 lb

## 2021-05-03 DIAGNOSIS — I69354 Hemiplegia and hemiparesis following cerebral infarction affecting left non-dominant side: Secondary | ICD-10-CM | POA: Diagnosis not present

## 2021-05-03 DIAGNOSIS — M62838 Other muscle spasm: Secondary | ICD-10-CM | POA: Diagnosis not present

## 2021-05-03 DIAGNOSIS — Z8673 Personal history of transient ischemic attack (TIA), and cerebral infarction without residual deficits: Secondary | ICD-10-CM

## 2021-05-03 MED ORDER — PREGABALIN 75 MG PO CAPS: 75.0000 mg | ORAL_CAPSULE | Freq: Two times a day (BID) | ORAL | 0 refills | Status: DC

## 2021-05-03 MED ORDER — METHOCARBAMOL 500 MG PO TABS: 500.0000 mg | ORAL_TABLET | Freq: Three times a day (TID) | ORAL | 0 refills | Status: DC | PRN

## 2021-05-03 NOTE — Patient Instructions (Addendum)
Continue with current medications  Follow up with ortho and pain management as scheduled  Return in 1 year

## 2021-05-03 NOTE — Progress Notes (Signed)
[facility]  PATIENT: Shirley Hansen DOB: [birth year]  REQUESTING CLINICIAN: Mayers, Cari S, PA-C HISTORY FROM: Patient  REASON FOR VISIT: Stroke   HISTORICAL  CHIEF COMPLAINT:  Chief Complaint  Patient presents with   New Patient (Initial Visit)    Rm 13. Accompanied by daughter. NP/Internal referral for hx of stroke. C/o left sided stiffness.    HISTORY OF PRESENT ILLNESS:  This is a 55 year old woman with past medical history of stroke with left-sided residual deficit in 2020, hypertension, hyperlipidemia and muscle spasm who is presenting to establish care for her history of strokes.  Patient stated stroke happened in 2020, etiology of the stroke was hypertensive bleed, she had bleeding in the right coronary radiata/thalamus.  Since then, she does have chronic left-sided weakness.  She reported on September 3 she fell and had difficulty getting up since then she has worsening shoulder pain, she set up to have a shoulder MRI done by orthopedics.  She does also have history of muscle spasm, has tried Robaxin, Flexeril, gabapentin and at on Oct 28, she presented to the ED with hallucination felt to be secondary to her muscle relaxants, mostly Flexeril which was discontinued.  During this time also she has been completing physical therapy, she is finished last week.  Patient said that she is able to walk with a walker no recent falls.  She is compliant with the medication.   OTHER MEDICAL CONDITIONS: Stroke with left sided deficit in 2020, HTN, HLD, muscle spasms    REVIEW OF SYSTEMS: Full 14 system review of systems performed and negative with exception of: as noted in the HPI   ALLERGIES: No Known Allergies  HOME MEDICATIONS: Outpatient Medications Prior to Visit  Medication Sig Dispense Refill   acetaminophen (TYLENOL) 325 MG tablet Take 650 mg by mouth every 6 (six) hours as needed for mild pain.     albuterol (VENTOLIN HFA) 108 (90 Base) MCG/ACT inhaler  Inhale 2 puffs into the lungs every 2 (two) hours as needed for wheezing or shortness of breath.     amitriptyline (ELAVIL) 25 MG tablet Take 1 tablet (25 mg total) by mouth at bedtime. 90 tablet 1   amLODipine (NORVASC) 10 MG tablet Take 1 tablet (10 mg total) by mouth daily. 90 tablet 1   Artificial Tear Solution (GENTEAL TEARS OP) Place 1 drop into both eyes daily as needed (dry eyes).     aspirin EC 81 MG tablet Take 1 tablet (81 mg total) by mouth daily. Swallow whole. 90 tablet 3   atorvastatin (LIPITOR) 80 MG tablet Take 80 mg by mouth at bedtime.     brimonidine (ALPHAGAN) 0.2 % ophthalmic solution Place 1 drop into both eyes 2 (two) times daily.     carvedilol (COREG) 25 MG tablet Take 1 tablet (25 mg total) by mouth 2 (two) times daily. 60 tablet 0   diclofenac Sodium (VOLTAREN) 1 % GEL Apply 1 application topically 4 (four) times daily as needed (pain).     dorzolamide-timolol (COSOPT) 22.3-6.8 MG/ML ophthalmic solution Place 1 drop into both eyes 2 (two) times daily.     DULoxetine (CYMBALTA) 60 MG capsule Take 1 capsule (60 mg total) by mouth daily. 90 capsule 1   famotidine (PEPCID) 20 MG tablet Take 20 mg by mouth 2 (two) times daily.     hydrochlorothiazide (HYDRODIURIL) 25 MG tablet Take 0.5 tablets (12.5 mg total) by mouth daily. 15 tablet 1   latanoprost (XALATAN) 0.005 % ophthalmic solution Place 1  drop into both eyes at bedtime.     meclizine (ANTIVERT) 25 MG tablet Take 1 tablet (25 mg total) by mouth 3 (three) times daily as needed for dizziness. 30 tablet 0   Netarsudil Dimesylate 0.02 % SOLN Place 1 drop into both eyes at bedtime.     methocarbamol (ROBAXIN) 500 MG tablet Take 1 tablet (500 mg total) by mouth every 8 (eight) hours as needed for muscle spasms. 30 tablet 0   pregabalin (LYRICA) 50 MG capsule Take 1 capsule (50 mg total) by mouth 2 (two) times daily. Resume after few days 60 capsule 1   No facility-administered medications prior to visit.    PAST MEDICAL  HISTORY: Past Medical History:  Diagnosis Date   Hypertension    Pancreatitis    Stage 3a chronic kidney disease ([place])    Stroke ([place])     PAST SURGICAL HISTORY: Past Surgical History:  Procedure Laterality Date   TUBAL LIGATION      FAMILY HISTORY: History reviewed. No pertinent family history.  SOCIAL HISTORY: Social History   Socioeconomic History   Marital status: Single    Spouse name: Not on file   Number of children: Not on file   Years of education: Not on file   Highest education level: Not on file  Occupational History   Not on file  Tobacco Use   Smoking status: Former   Smokeless tobacco: Never  Substance and Sexual Activity   Alcohol use: Never   Drug use: Never   Sexual activity: Not on file  Other Topics Concern   Not on file  Social History Narrative   Not on file   Social Determinants of Health   Financial Resource Strain: Not on file  Food Insecurity: Not on file  Transportation Needs: Not on file  Physical Activity: Not on file  Stress: Not on file  Social Connections: Not on file  Intimate Partner Violence: Not on file     PHYSICAL EXAM  GENERAL EXAM/CONSTITUTIONAL: Vitals:  Vitals:   05/03/21 1307  BP: 125/85  Pulse: 91  Weight: 187 lb (84.8 kg)  Height: 5\' 4"  (1.626 m)   Body mass index is 32.1 kg/m. Wt Readings from Last 3 Encounters:  05/03/21 187 lb (84.8 kg)  04/20/21 187 lb (84.8 kg)  04/08/21 188 lb (85.3 kg)   Patient is in no distress; well developed, nourished and groomed; neck is supple  CARDIOVASCULAR: Examination of carotid arteries is normal; no carotid bruits Regular rate and rhythm, no murmurs Examination of peripheral vascular system by observation and palpation is normal  EYES: Pupils round and reactive to light, Visual fields full to confrontation, Extraocular movements intacts,   MUSCULOSKELETAL: Gait, strength, tone, movements noted in Neurologic exam below  NEUROLOGIC: MENTAL STATUS:  No  flowsheet data found. awake, alert, oriented to person, place and time recent and remote memory intact normal attention and concentration language fluent, comprehension intact, naming intact fund of knowledge appropriate  CRANIAL NERVE:  2nd, 3rd, 4th, 6th - pupils equal and reactive to light, visual fields full to confrontation, extraocular muscles intact, no nystagmus 5th - facial sensation symmetric 7th - facial strength symmetric 8th - hearing intact 9th - palate elevates symmetrically, uvula midline 11th - shoulder shrug symmetric 12th - tongue protrusion midline  MOTOR:  normal bulk and tone, full strength in the right UE, and LE. There is weakness 4/5 in the LUE and LLE.   COORDINATION:  finger-nose-finger is normal with the right and slightly  dysmetric on the left.   REFLEXES:  deep tendon reflexes present and symmetric  GAIT/STATION:  Walk with a walker, hemiplegic gait.      DIAGNOSTIC DATA (LABS, IMAGING, TESTING) - I reviewed patient records, labs, notes, testing and imaging myself where available.  Lab Results  Component Value Date   WBC 8.1 04/09/2021   HGB 12.1 04/09/2021   HCT 37.4 04/09/2021   MCV 90.3 04/09/2021   PLT 337 04/09/2021      Component Value Date/Time   NA 140 04/10/2021 0343   NA 146 (H) 03/22/2021 1558   K 4.0 04/20/2021 1145   CL 106 04/10/2021 0343   CO2 27 04/10/2021 0343   GLUCOSE 145 (H) 04/10/2021 0343   BUN 7 04/10/2021 0343   BUN 8 03/22/2021 1558   CREATININE 0.81 04/10/2021 0343   CALCIUM 8.6 (L) 04/10/2021 0343   PROT 7.1 04/08/2021 1702   PROT 7.1 03/22/2021 1558   ALBUMIN 3.7 04/08/2021 1702   ALBUMIN 4.4 03/22/2021 1558   AST 22 04/08/2021 1702   ALT 24 04/08/2021 1702   ALKPHOS 79 04/08/2021 1702   BILITOT 0.6 04/08/2021 1702   BILITOT 0.2 03/22/2021 1558   GFRNONAA >60 04/10/2021 0343   No results found for: CHOL, HDL, LDLCALC, LDLDIRECT, TRIG, CHOLHDL No results found for: HGBA1C No results found for:  VITAMINB12 Lab Results  Component Value Date   TSH 1.330 04/04/2021    MRI Brain 03/05/2021 No evidence of acute intracranial abnormality. Redemonstrated sequela of remote hemorrhage within the right corona radiata/thalamus. Redemonstrated chronic small-vessel infarcts within the bilateral corona radiata, left basal ganglia, left thalamus and within the pons. 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 Mild generalized parenchymal atrophy    ASSESSMENT AND PLAN  55 y.o. year old female with past medical history of right corona radiata/thalamic stroke with left-sided deficit, hypertension, hyperlipidemia, and muscle spasms who is presenting to establish care for her stroke.  In terms of her stroke, the etiology was hypertension, since then her blood pressure is now well managed.  She does have residual left-sided weakness and residual muscle spasm.  She has tried multiple muscle relaxant, last one was Flexeril and gave her hallucinations which was later discontinued.  Currently she is on methocarbamol and low-dose pregabalin.  I refill her methocarbamol and pregabalin, she is set up to follow-up with the Ortho regarding her left shoulder pain.  She is pending a left shoulder MRI.  From a neurological perspective we will continue all medications, no change, her blood pressure goal is less than [id number] systolic.  I inform patient that if her shoulder MRI is negative and she still continuing to have neck pain, and left shoulder pain I will obtain a cervical spine MRI to rule out radiculopathy.  I will see her in 1 year for follow-up.   1. History of stroke   2. Muscle spasticity   3. 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   Meds ordered this encounter  Medications   methocarbamol (ROBAXIN) 500 MG tablet    Sig: Take 1 tablet (500 mg total) by mouth every 8 (eight) hours as needed for muscle spasms.  Dispense:  60 tablet    Refill:  0   DISCONTD: pregabalin (LYRICA) 75 MG capsule    Sig: Take 1 capsule (75 mg total) by mouth 2 (two) times daily. Resume after few days    Dispense:  60 capsule    Refill:  0   pregabalin (LYRICA) 75 MG capsule    Sig: Take 1 capsule (75 mg total) by mouth 2 (two) times daily. Resume after few days    Dispense:  60 capsule    Refill:  0    Return in about 1 year (around 05/03/2022).    Windell Norfolk, MD 05/03/2021, 10:38 PM  [facility] [address], [address] [place], [place] [address] [phone]

## 2021-05-09 ENCOUNTER — Inpatient Hospital Stay: Admission: RE | Admit: 2021-05-09 | Payer: Medicaid Other | Source: Ambulatory Visit

## 2021-05-10 NOTE — Progress Notes (Incomplete)
° °  S:    Patient arrives ***.    Presents to the clinic for hypertension evaluation, counseling, and management. Patient was hospitalized on 04/08/2021 for encephalopathy. Patient was referred and last seen by Primary Care Provider Bertram Denver on 04/20/21 to establish care. At that time hctz 12.5 mg was added for elevated blood pressure on previous regimen. Patient last seen by Neurology on 05/03/21 where no changes were made.  Medication adherence *** .  Current BP Medications include:   -Amlodipine 10 mg QD -Carvedilol 25 mg BID -HCTZ 12.5 mg QD  Antihypertensives tried in the past include: none   Dietary habits include: *** Exercise habits include: patient has limited mobility with hemiparesis Family / Social history:  -Former smoker  ASCVD risk factors include:prior stroke,    O:   Home BP readings: ***  Last 3 Office BP readings: BP Readings from Last 3 Encounters:  05/03/21 125/85  04/20/21 (!) 142/93  04/10/21 (!) 133/97    BMET    Component Value Date/Time   NA 140 04/10/2021 0343   NA 146 (H) 03/22/2021 1558   K 4.0 04/20/2021 1145   CL 106 04/10/2021 0343   CO2 27 04/10/2021 0343   GLUCOSE 145 (H) 04/10/2021 0343   BUN 7 04/10/2021 0343   BUN 8 03/22/2021 1558   CREATININE 0.81 04/10/2021 0343   CALCIUM 8.6 (L) 04/10/2021 0343   GFRNONAA >60 04/10/2021 0343    Renal function: CrCl cannot be calculated (Patient's most recent lab result is older than the maximum 21 days allowed.).  Clinical ASCVD: Yes  The ASCVD Risk score (Arnett DK, et al., 2019) failed to calculate for the following reasons:   The patient has a prior MI or stroke diagnosis   A/P: Hypertension diagnosed *** currently *** on current medications. BP Goal = < *** mmHg. Medication adherence ***.  -{Meds adjust:18428} ***.  -F/u labs ordered - *** -Counseled on lifestyle modifications for blood pressure control including reduced dietary sodium, increased exercise, adequate  sleep.  Results reviewed and written information provided.   Total time in face-to-face counseling *** minutes. Next appointment with PCP is 07/22/2020.  F/U Clinic Visit in ***.  Patient seen with ***

## 2021-05-11 ENCOUNTER — Ambulatory Visit: Payer: Medicaid Other | Admitting: Pharmacist

## 2021-05-11 ENCOUNTER — Ambulatory Visit: Payer: Medicaid Other | Admitting: Orthopaedic Surgery

## 2021-05-13 ENCOUNTER — Telehealth: Payer: Self-pay | Admitting: Nurse Practitioner

## 2021-05-13 NOTE — Telephone Encounter (Signed)
Copied from CRM [id number]. Topic: Quick Communication - Home Health Verbal Orders >> May 12, 2021  4:40 PM Gaetana Michaelis A wrote: Caller/Agency: [facility]  Callback Number: [phone] Requesting OT/PT/Skilled Nursing/Social Work/Speech Therapy: PT Frequency: 2w2

## 2021-05-13 NOTE — Telephone Encounter (Signed)
Called and left a vm will try back Monday .

## 2021-05-18 NOTE — Telephone Encounter (Signed)
Verbal order given on 05/18/2021

## 2021-05-27 ENCOUNTER — Other Ambulatory Visit: Payer: Self-pay

## 2021-05-27 ENCOUNTER — Ambulatory Visit
Admission: RE | Admit: 2021-05-27 | Discharge: 2021-05-27 | Disposition: A | Discharge status: Home / Self Care | Payer: Medicaid Other | Source: Ambulatory Visit | Attending: Orthopaedic Surgery | Admitting: Orthopaedic Surgery

## 2021-05-27 DIAGNOSIS — G8929 Other chronic pain: Secondary | ICD-10-CM

## 2021-05-27 DIAGNOSIS — M25512 Pain in left shoulder: Secondary | ICD-10-CM

## 2021-05-27 NOTE — Telephone Encounter (Signed)
Olegario Messier from [facility] calling in to let Erskine Squibb know that pt would be denied for home health, she says that she doesn't qualify, requesting a phone call to see discuss further [phone]

## 2021-05-30 NOTE — Telephone Encounter (Signed)
Call returned to [facility].  She explained that she went out to see patient to assess her needs. Her 55 yo daughter is home with her most of the time as she is home schooled and the patient said that her daughter provides most of the assistance with ADLs.  The patient needs some assistance with bathing  and assistance in the bathroom; but that was not enough to qualify her for services.  Olegario Messier said that she evaluated the criteria thoroughly.  The patient has been informed of this decision and has the right to appeal.  If the patient's condition changes, she can certainly be re-referred.

## 2021-06-04 NOTE — Telephone Encounter (Signed)
THANK YOU FOR YOUR ASSISTANCE WITH THIS Erskine Squibb

## 2021-06-07 ENCOUNTER — Ambulatory Visit: Payer: Medicaid Other | Admitting: Internal Medicine

## 2021-06-09 ENCOUNTER — Other Ambulatory Visit: Payer: Self-pay | Admitting: Neurology

## 2021-06-09 DIAGNOSIS — M62838 Other muscle spasm: Secondary | ICD-10-CM

## 2021-06-09 DIAGNOSIS — I69354 Hemiplegia and hemiparesis following cerebral infarction affecting left non-dominant side: Secondary | ICD-10-CM

## 2021-06-14 NOTE — Telephone Encounter (Signed)
Verify Drug Registry For Pregabalin 75mg  Last Filled: 05/03/2021 Quantity: 60 for 30 days Last appointment: 05/03/2021 Next appointment: 05/03/2022

## 2021-06-16 ENCOUNTER — Other Ambulatory Visit: Payer: Self-pay

## 2021-06-16 ENCOUNTER — Encounter: Payer: Self-pay | Admitting: Orthopaedic Surgery

## 2021-06-16 ENCOUNTER — Ambulatory Visit (INDEPENDENT_AMBULATORY_CARE_PROVIDER_SITE_OTHER): Payer: Medicaid Other | Admitting: Orthopaedic Surgery

## 2021-06-16 ENCOUNTER — Other Ambulatory Visit: Payer: Self-pay | Admitting: Nurse Practitioner

## 2021-06-16 DIAGNOSIS — G8929 Other chronic pain: Secondary | ICD-10-CM

## 2021-06-16 DIAGNOSIS — I69354 Hemiplegia and hemiparesis following cerebral infarction affecting left non-dominant side: Secondary | ICD-10-CM

## 2021-06-16 DIAGNOSIS — I10 Essential (primary) hypertension: Secondary | ICD-10-CM

## 2021-06-16 DIAGNOSIS — M25512 Pain in left shoulder: Secondary | ICD-10-CM

## 2021-06-16 DIAGNOSIS — M62838 Other muscle spasm: Secondary | ICD-10-CM

## 2021-06-16 MED ORDER — LIDOCAINE HCL 2 % IJ SOLN
2.0000 mL | INTRAMUSCULAR | Status: AC | PRN
  Administered 2021-06-16: 2 mL

## 2021-06-16 MED ORDER — CARVEDILOL 25 MG PO TABS: 25.0000 mg | ORAL_TABLET | Freq: Two times a day (BID) | ORAL | 1 refills | Status: DC

## 2021-06-16 MED ORDER — BUPIVACAINE HCL 0.25 % IJ SOLN
2.0000 mL | INTRAMUSCULAR | Status: AC | PRN
  Administered 2021-06-16: 2 mL via INTRA_ARTICULAR

## 2021-06-16 MED ORDER — TRAMADOL HCL 50 MG PO TABS: 50.0000 mg | ORAL_TABLET | Freq: Three times a day (TID) | ORAL | 2 refills | Status: DC | PRN

## 2021-06-16 MED ORDER — KETOROLAC TROMETHAMINE 30 MG/ML IJ SOLN: 30.0000 mg | Freq: Once | INTRAMUSCULAR | Status: AC

## 2021-06-16 NOTE — Telephone Encounter (Signed)
Copied from CRM [id number]. Topic: Quick Communication - Rx Refill/Question >> Jun 16, 2021 10:58 AM Shirley Hansen wrote: Medication: pregabalin (LYRICA) 75 MG capsule [[id number]]   Rx #: [id number]  carvedilol (COREG) 25 MG tablet [[id number]]   Has the patient contacted their pharmacy? Yes.  The patient has been directed to contact their PCP (Agent: If no, request that the patient contact the pharmacy for the refill. If patient does not wish to contact the pharmacy document the reason why and proceed with request.) (Agent: If yes, when and what did the pharmacy advise?)  Preferred Pharmacy (with phone number or street name): [phone] [place] - [address] [place] [address] Phone: [phone] Fax: [phone]   Has the patient been seen for an appointment in the last year OR does the patient have an upcoming appointment? Yes.    Agent: Please be advised that RX refills may take up to 3 business days. We ask that you follow-up with your pharmacy.

## 2021-06-16 NOTE — Telephone Encounter (Signed)
Requested Prescriptions  Pending Prescriptions Disp Refills   pregabalin (LYRICA) 75 MG capsule 60 capsule 3    Sig: Take 1 capsule (75 mg total) by mouth 2 (two) times daily. Resume after few days     Not Delegated - Neurology:  Anticonvulsants - Controlled Failed - 06/16/2021  4:00 PM      Failed - This refill cannot be delegated      Passed - Valid encounter within last 12 months    Recent Outpatient Visits          1 month ago Encounter to establish care   [place], [place] W, NP   2 months ago Hemiparesis affecting left side as late effect of cerebrovascular accident (CVA) ([place])   [place], [place], PA-C   2 months ago Primary hypertension   [place], [place]      Future Appointments            In 1 week Tresa Endo, [place]   In 1 month Gildardo Pounds, NP [place]            carvedilol (COREG) 25 MG tablet 60 tablet 1    Sig: Take 1 tablet (25 mg total) by mouth 2 (two) times daily.     Cardiovascular:  Beta Blockers Passed - 06/16/2021  4:00 PM      Passed - Last BP in normal range    BP Readings from Last 1 Encounters:  05/03/21 125/85         Passed - Last Heart Rate in normal range    Pulse Readings from Last 1 Encounters:  05/03/21 91         Passed - Valid encounter within last 6 months    Recent Outpatient Visits          1 month ago Encounter to establish care   [place], [place] W, NP   2 months ago Hemiparesis affecting left side as late effect of cerebrovascular accident (CVA) ([place])   [place], [place]   2 months ago Primary hypertension   [place], [place]      Future Appointments            In 1 week Daisy Blossom, Jarome [place], [place]   In 1 month Gildardo Pounds, NP [place]

## 2021-06-16 NOTE — Progress Notes (Signed)
Office Visit Note   Patient: Shirley Hansen           Date of Birth: 04-19-1966           MRN: [id number] Visit Date: 06/16/2021              Requested by: Shirley Pounds, NP Shirley Hansen,  Shirley Hansen [id number] PCP: Shirley Pounds, NP   Assessment & Plan: Visit Diagnoses:  1. Chronic left shoulder pain     Plan: Impression is chronic left shoulder pain with underlying tendinosis of the supraspinatus and infraspinatus tendons.  At this point, I discussed proceeding with subacromial Toradol injection as well as a course of physical therapy.  She currently gets home health physical therapy so we will add range of motion and strengthening to the left upper extremity to her current prescription.  She will follow-up with [name] as needed.  Follow-Up Instructions: Return if symptoms worsen or fail to improve.   Orders:  Orders Placed This Encounter  Procedures   Large Joint Inj: L subacromial bursa   Meds ordered this encounter  Medications   ketorolac (TORADOL) 30 MG/ML injection 30 mg   traMADol (ULTRAM) 50 MG tablet    Sig: Take 1 tablet (50 mg total) by mouth 3 (three) times daily as needed.    Dispense:  30 tablet    Refill:  2      Procedures: Large Joint Inj: L subacromial bursa on 06/16/2021 9:18 AM Indications: pain Details: 22 G needle Medications: 2 mL lidocaine 2 %; 2 mL bupivacaine 0.25 % Outcome: tolerated well, no immediate complications Patient was prepped and draped in the usual sterile fashion.      Clinical Data: No additional findings.   Subjective: Chief Complaint  Patient presents with   Left Shoulder - Follow-up    MRI review    HPI patient is a pleasant 56 year old wheelchair-bound female who comes in today for follow-up of her left shoulder MRI.  She sustained a fall well over 6 months ago which caused left shoulder pain.  She was initially seen at [facility] where x-rays of the left shoulder were obtained and were negative for fracture.  She  underwent what sounds like subacromial cortisone injection which significantly helped but only lasted about 2 to 3 weeks.  She was seen by [name] little over 2 months ago where MRI of the left shoulder was obtained.  MRI showed mild tendinosis of the supraspinatus and moderate tendinosis of the infraspinatus.  She is status post CVA back in 2020 and is wheelchair-bound where she gets home health physical therapy.  She started this recently but stopped due to the increased shoulder pain.  Review of Systems as detailed in HPI.  All others reviewed and are negative.   Objective: Vital Signs: There were no vitals taken for this visit.  Physical Exam well-developed well-nourished female in no acute distress.  Alert and oriented x3.  Ortho Exam left shoulder exam reveals forward flexion and abduction to approximately 45 degrees.  Limited internal and external rotation.  She does have a fair amount of weakness but notes that this is the strength that she has had following her stroke.  She is neurovascular intact distally.  Specialty Comments:  No specialty comments available.  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  Past Surgical History:  Procedure Laterality Date   TUBAL  LIGATION     Social History   Occupational History   Not on file  Tobacco Use   Smoking status: Former   Smokeless tobacco: Never  Substance and Sexual Activity   Alcohol use: Never   Drug use: Never   Sexual activity: Not on file

## 2021-06-16 NOTE — Telephone Encounter (Signed)
Requested medication (s) are due for refill today: already filled, duplicate request  Requested medication (s) are on the active medication list: yes  Future visit scheduled: 07/22/20  Notes to clinic:  prescriber not in this practice, duplicate request by same pharm, please assess.   Requested Prescriptions  Pending Prescriptions Disp Refills   pregabalin (LYRICA) 75 MG capsule 60 capsule 3    Sig: Take 1 capsule (75 mg total) by mouth 2 (two) times daily. Resume after few days     Not Delegated - Neurology:  Anticonvulsants - Controlled Failed - 06/16/2021  4:00 PM      Failed - This refill cannot be delegated      Passed - Valid encounter within last 12 months    Recent Outpatient Visits           1 month ago Encounter to establish care   [facility] [place], [place] W, NP   2 months ago Hemiparesis affecting left side as late effect of cerebrovascular accident (CVA) (H[facility])   [facility] [place], [place], PA-C   2 months ago Primary hypertension   [place] [address] [facility] [place], [place], [place]       Future Appointments             In 1 week Lois Huxley, Cornelius Moras, RPH-CPP [facility]   In 1 month [place], [place], NP [place] [facility] [facility]            Signed Prescriptions Disp Refills   carvedilol (COREG) 25 MG tablet 60 tablet 1    Sig: Take 1 tablet (25 mg total) by mouth 2 (two) times daily.     Cardiovascular:  Beta Blockers Passed - 06/16/2021  4:00 PM      Passed - Last BP in normal range    BP Readings from Last 1 Encounters:  05/03/21 125/85          Passed - Last Heart Rate in normal range    Pulse Readings from Last 1 Encounters:  05/03/21 91          Passed - Valid encounter within last 6 months    Recent Outpatient Visits           1 month ago Encounter to establish care   [facility] [place],  [place] W, NP   2 months ago Hemiparesis affecting left side as late effect of cerebrovascular accident (CVA) (HCC)   [facility] [place], [place], [place]   2 months ago Primary hypertension   [place] [address] [facility] [place], [place], [place]       Future Appointments             In 1 week Lois Huxley, Cornelius Moras, RPH-CPP [employer] [facility]   In 1 month Claiborne Rigg, NP [facility] [employer] [facility]

## 2021-06-17 ENCOUNTER — Telehealth: Payer: Self-pay | Admitting: Nurse Practitioner

## 2021-06-17 NOTE — Telephone Encounter (Signed)
Medication Refill - Medication: medication for potassium, she doesn't know the name of it  Has the patient contacted their pharmacy? yes (Agent: If no, request that the patient contact the pharmacy for the refill. If patient does not wish to contact the pharmacy document the reason why and proceed with request.) (Agent: If yes, when and what did the pharmacy advise?)contact pcp  Preferred Pharmacy (with phone number or street name): [id number] [place] Phone:  [phone]  Fax:  [phone]     Has the patient been seen for an appointment in the last year OR does the patient have an upcoming appointment? yes  Agent: Please be advised that RX refills may take up to 3 business days. We ask that you follow-up with your pharmacy.

## 2021-06-17 NOTE — Telephone Encounter (Signed)
Patient called, left VM to return the call to speak to a nurse. Potassium is not on her current medication list. Is this something she normally takes.

## 2021-06-17 NOTE — Telephone Encounter (Signed)
Patient called, left VM to return the call to speak to a nurse about the potassium refill request.

## 2021-06-20 NOTE — Telephone Encounter (Signed)
Attempted to call patient-x2- number does not connect. Request sent for review

## 2021-06-27 NOTE — Telephone Encounter (Signed)
Patient was last seen by her PCP [facility] 11/9 and received a lab draw for potassium and PCP response was normal level. MA of PCP should contact patient with this information and provide patient an appointment with PCP with follow up on concern.

## 2021-06-28 ENCOUNTER — Other Ambulatory Visit: Payer: Self-pay | Admitting: Neurology

## 2021-06-28 ENCOUNTER — Ambulatory Visit: Payer: Medicaid Other | Admitting: Pharmacist

## 2021-06-29 NOTE — Telephone Encounter (Signed)
Rx refilled.

## 2021-06-30 ENCOUNTER — Telehealth: Payer: Self-pay

## 2021-06-30 ENCOUNTER — Telehealth: Payer: Self-pay | Admitting: Nurse Practitioner

## 2021-06-30 NOTE — Telephone Encounter (Signed)
Na

## 2021-06-30 NOTE — Telephone Encounter (Signed)
Will, OT from [facility] calling to report med interaction level 2 with Tramadol 50mg  and Amitriptyline 25mg . Pt states she takes the amitriptyline at [facility] and when taking tramadol at [facility] as well she may have a hard time getting up in the morning but no other symptoms noted. Advised her I will send this to provider and will follow up if needed but in mean time to take meds 1-2 hours apart and see if that helps with getting up in the morning. He and pt verbalized understanding.

## 2021-07-01 ENCOUNTER — Telehealth: Payer: Self-pay | Admitting: Nurse Practitioner

## 2021-07-01 NOTE — Telephone Encounter (Signed)
Pt called, unable to reach pt d/t recording "call can't be completed at this time". No other number on file for pt.

## 2021-07-01 NOTE — Telephone Encounter (Signed)
Copied from [place] [id number]. Topic: General - Other >> Jun 30, 2021  1:10 PM McGill, Nelva Bush wrote: Reason for CRM: Juliann Pulse, case Freight forwarder, stated that she sent faxes about the patient's need for medical equipment on 06/22/21 and 06/24/2021. Requesting call back. [phone] Fax [phone]

## 2021-07-01 NOTE — Telephone Encounter (Signed)
Hello.  Please send messages to either [facility] OR Me. Both of [name] do not need to receive the same messages. It makes for a confusing day and extra work on both of our parts. Thanks!

## 2021-07-01 NOTE — Telephone Encounter (Signed)
Provider has not had an Rx for potassium must contact provider who prescribed.

## 2021-07-01 NOTE — Telephone Encounter (Signed)
Do not take tramadol at night with elavil. Elavil can be used for pain she should not need to take both at night. Thanks

## 2021-07-01 NOTE — Telephone Encounter (Signed)
Order will be faxed next week per PCP

## 2021-07-02 NOTE — Telephone Encounter (Signed)
Called pt unable to reach left VM to call back ?

## 2021-07-12 NOTE — Telephone Encounter (Signed)
Called pt made aware of NP message, stated she is aware . No further concerns verbalized

## 2021-07-14 ENCOUNTER — Telehealth: Payer: Self-pay

## 2021-07-14 NOTE — Telephone Encounter (Signed)
Faxed from [facility] sent 07/14/2021

## 2021-07-22 ENCOUNTER — Ambulatory Visit: Payer: Medicaid Other | Admitting: Nurse Practitioner

## 2021-07-29 ENCOUNTER — Other Ambulatory Visit: Payer: Self-pay | Admitting: Neurology

## 2021-08-01 NOTE — Telephone Encounter (Signed)
Rx refilled.

## 2021-08-05 ENCOUNTER — Ambulatory Visit: Payer: Self-pay | Admitting: *Deleted

## 2021-08-05 NOTE — Telephone Encounter (Signed)
Message left that we were calling back about her missed appt and ongoing issues.

## 2021-08-09 ENCOUNTER — Telehealth (HOSPITAL_BASED_OUTPATIENT_CLINIC_OR_DEPARTMENT_OTHER): Payer: Medicaid Other | Admitting: Nurse Practitioner

## 2021-08-09 ENCOUNTER — Encounter: Payer: Self-pay | Admitting: Nurse Practitioner

## 2021-08-09 ENCOUNTER — Other Ambulatory Visit: Payer: Self-pay

## 2021-08-09 DIAGNOSIS — R490 Dysphonia: Secondary | ICD-10-CM

## 2021-08-09 DIAGNOSIS — I69354 Hemiplegia and hemiparesis following cerebral infarction affecting left non-dominant side: Secondary | ICD-10-CM

## 2021-08-09 DIAGNOSIS — Z1159 Encounter for screening for other viral diseases: Secondary | ICD-10-CM

## 2021-08-09 DIAGNOSIS — Z1231 Encounter for screening mammogram for malignant neoplasm of breast: Secondary | ICD-10-CM

## 2021-08-09 DIAGNOSIS — J383 Other diseases of vocal cords: Secondary | ICD-10-CM | POA: Diagnosis not present

## 2021-08-09 DIAGNOSIS — I10 Essential (primary) hypertension: Secondary | ICD-10-CM | POA: Diagnosis not present

## 2021-08-09 DIAGNOSIS — G894 Chronic pain syndrome: Secondary | ICD-10-CM

## 2021-08-09 DIAGNOSIS — Z1211 Encounter for screening for malignant neoplasm of colon: Secondary | ICD-10-CM

## 2021-08-09 MED ORDER — AMLODIPINE BESYLATE 10 MG PO TABS
10.0000 mg | ORAL_TABLET | Freq: Every day | ORAL | 1 refills | Status: DC
Start: 2021-08-09 — End: 2021-10-14
  Filled 2021-08-09: qty 30, 30d supply, fill #0

## 2021-08-09 MED ORDER — ATORVASTATIN CALCIUM 80 MG PO TABS
80.0000 mg | ORAL_TABLET | Freq: Every day | ORAL | 3 refills | Status: DC
  Filled 2021-08-09: qty 30, 30d supply, fill #0

## 2021-08-09 MED ORDER — CARVEDILOL 25 MG PO TABS
25.0000 mg | ORAL_TABLET | Freq: Two times a day (BID) | ORAL | 1 refills | Status: DC
  Filled 2021-08-09: qty 60, 30d supply, fill #0

## 2021-08-09 MED ORDER — AMITRIPTYLINE HCL 25 MG PO TABS
25.0000 mg | ORAL_TABLET | Freq: Every day | ORAL | 1 refills | Status: DC
  Filled 2021-08-09: qty 30, 30d supply, fill #0

## 2021-08-09 MED ORDER — HYDROCHLOROTHIAZIDE 25 MG PO TABS
12.5000 mg | ORAL_TABLET | Freq: Every day | ORAL | 1 refills | Status: DC
  Filled 2021-08-09: qty 15, 30d supply, fill #0

## 2021-08-09 NOTE — Progress Notes (Signed)
Virtual Visit via Telephone Note Due to national recommendations of social distancing due to [place] 19, telehealth visit is felt to be most appropriate for this patient at this time.  I discussed the limitations, risks, security and privacy concerns of performing an evaluation and management service by telephone and the availability of in person appointments. I also discussed with the patient that there may be a patient responsible charge related to this service. The patient expressed understanding and agreed to proceed.    I connected with Shirley Hansen on 08/09/21  at   2:50 PM EST  EDT by telephone and verified that I am speaking with the correct person using two identifiers.  Location of Patient: Private Residence   Location of Provider: [place] [facility] [employer] Office    Persons participating in Telemedicine visit: Shirley Rankins FNP-BC Shirley Hansen    History of Present Illness: Telemedicine visit for: ENT referral and PT referral  has a past medical history of Hypertension, HPL,  Pancreatitis, Stage 3a chronic kidney disease ([place]), and Stroke with left sided residual.   PT Referral She has a history of stroke. She completed Home Health physical therapy and now is requesting a referral to OP PT. She is currently using a walker to assist with mobility. Denies any recent falls  ENT REFERRAL  Now endorsing increased vocal hoarseness and requesting referral to ENT. She was diagnosed by [facility] ENT Dr. 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 Shirley Hansen's voice is stable and the cyst has not enlarged. Would recommend no surgical intervention. She does not have voice complaints so no VOT indicated. Will repeat laryngoscopy in one year or sooner if she has new voice concerns. 2. Dysphagia worked up with MBS and barium swallow. She has seen GI as well. Recommend continued PT exercises to keep neck  muscles relaxed as she felt this helped with swallowing. 3. MD return in one year.      Past Medical History:  Diagnosis Date   Hypertension    Pancreatitis    Stage 3a chronic kidney disease ([place])    Stroke [facility])     Past Surgical History:  Procedure Laterality Date   TUBAL LIGATION      History reviewed. No pertinent family history.  Social History   Socioeconomic History   Marital status: Single    Spouse name: Not on file   Number of children: Not on file   Years of education: Not on file   Highest education level: Not on file  Occupational History   Not on file  Tobacco Use   Smoking status: Former   Smokeless tobacco: Never  Substance and Sexual Activity   Alcohol use: Never   Drug use: Never   Sexual activity: Not on file  Other Topics Concern   Not on file  Social History Narrative   Not on file   Social Determinants of Health   Financial Resource Strain: Not on file  Food Insecurity: Not on file  Transportation Needs: Not on file  Physical Activity: Not on file  Stress: Not on file  Social Connections: Not on file     Observations/Objective: Awake, alert and oriented x 3   Review of Systems  Constitutional:  Negative for fever, malaise/fatigue and weight loss.  HENT: Negative.  Negative for nosebleeds.        SEE HPI  Eyes: Negative.  Negative for blurred  vision, double vision and photophobia.  Respiratory: Negative.  Negative for cough and shortness of breath.   Cardiovascular: Negative.  Negative for chest pain, palpitations and leg swelling.  Gastrointestinal: Negative.  Negative for heartburn, nausea and vomiting.  Musculoskeletal:  Positive for back pain and joint pain. Negative for myalgias.  Neurological:  Positive for weakness. Negative for dizziness, focal weakness, seizures and headaches.  Psychiatric/Behavioral: Negative.  Negative for suicidal ideas.    Assessment and Plan: Diagnoses and all orders for this visit:  Hemiparesis  affecting left side as late effect of cerebrovascular accident (CVA) ([place]) -     Ambulatory referral to Physical Therapy  Vocal cord cyst -     Ambulatory referral to ENT  Hoarse voice quality -     Ambulatory referral to ENT  Primary hypertension -     amLODipine (NORVASC) 10 MG tablet; Take 1 tablet (10 mg total) by mouth daily. -     carvedilol (COREG) 25 MG tablet; Take 1 tablet (25 mg total) by mouth 2 (two) times daily. -     hydrochlorothiazide (HYDRODIURIL) 25 MG tablet; Take 0.5 tablets (12.5 mg total) by mouth daily. -     CMP14+EGFR Continue all antihypertensives as prescribed.  Remember to bring in your blood pressure log with you for your follow up appointment.  DASH/Mediterranean Diets are healthier choices for HTN.    Chronic pain syndrome -     amitriptyline (ELAVIL) 25 MG tablet; Take 1 tablet (25 mg total) by mouth at bedtime.  Colon cancer screening -     Ambulatory referral to Gastroenterology  Breast cancer screening by mammogram -     MS DIGITAL SCREENING TOMO BILATERAL; Future  Need for hepatitis C screening test -     HCV Ab w Reflex to Quant PCR  Dyslipidemia  -     atorvastatin (LIPITOR) 80 MG tablet; Take 1 tablet (80 mg total) by mouth at bedtime. INSTRUCTIONS: Work on a low fat, heart healthy diet and participate in regular aerobic exercise program by working out at least 150 minutes per week; 5 days a week-30 minutes per day. Avoid red meat/beef/steak,  fried foods. junk foods, sodas, sugary drinks, unhealthy snacking, alcohol and smoking.  Drink at least 80 oz of water per day and monitor your carbohydrate intake daily.       Follow Up Instructions Return if symptoms worsen or fail to improve.     I discussed the assessment and treatment plan with the patient. The patient was provided an opportunity to ask questions and all were answered. The patient agreed with the plan and demonstrated an understanding of the instructions.   The patient was  advised to call back or seek an in-person evaluation if the symptoms worsen or if the condition fails to improve as anticipated.  I provided 15 minutes of non-face-to-face time during this encounter including median intraservice time, reviewing previous notes, labs, imaging, medications and explaining diagnosis and management.  Gildardo Pounds, FNP-BC

## 2021-08-15 ENCOUNTER — Ambulatory Visit: Payer: Medicaid Other | Admitting: Physical Therapy

## 2021-08-16 ENCOUNTER — Other Ambulatory Visit: Payer: Self-pay

## 2021-08-26 ENCOUNTER — Ambulatory Visit: Payer: Medicaid Other

## 2021-08-29 MED ORDER — EXTENDABLE BEDSIDE RAIL MISC: 0 refills | Status: DC

## 2021-08-30 ENCOUNTER — Ambulatory Visit: Payer: Self-pay

## 2021-08-30 NOTE — Telephone Encounter (Signed)
?  Chief Complaint: back pain ?Symptoms: severe back pain in upper and lower back, 10/10 ?Frequency: been ongoing 1 month or more ?Pertinent Negatives: NA ?Disposition: [x] ED /[] Urgent Care (no appt availability in office) / [] Appointment(In office/virtual)/ []  [place] [facility]/ [] Home Care/ [] Refused Recommended Disposition /[] [place] [facility]/ []  Follow-up with PCP ?Additional Notes: Pt states she had an injection and it helped a bit but now pain is worse than ever. She reports being unable to get out of bed and make it to bathroom in time due to pain being so bad. Pt was upset and crying during call. She states no medication helps with the pain. I advised her to go to ED  ? ? ?Reason for Disposition ? [1] SEVERE abdominal pain AND [2] present > 1 hour ? ?Answer Assessment - Initial Assessment Questions ?1. ONSET: "When did the pain begin?"  ?    1 month  ?2. LOCATION: "Where does it hurt?" (upper, mid or lower back) ?    Upper back under shoulder blades and lower back  ?3. SEVERITY: "How bad is the pain?"  (e.g., Scale 1-10; mild, moderate, or severe) ?  - MILD (1-3): doesn't interfere with normal activities  ?  - MODERATE (4-7): interferes with normal activities or awakens from sleep  ?  - SEVERE (8-10): excruciating pain, unable to do any normal activities  ?    10 ?4. PATTERN: "Is the pain constant?" (e.g., yes, no; constant, intermittent)  ?    Comes and goes depending on activity  ?5. RADIATION: "Does the pain shoot into your legs or elsewhere?" ?    No ?8. MEDICATIONS: "What have you taken so far for the pain?" (e.g., nothing, acetaminophen, NSAIDS) ?    Tramadol, gabapentin ?9. NEUROLOGIC SYMPTOMS: "Do you have any weakness, numbness, or problems with bowel/bladder control?" ?    Unable to make it to bathroom in time  ?10. OTHER SYMPTOMS: "Do you have any other symptoms?" (e.g., fever, abdominal pain, burning with urination, blood in urine) ?      No ? ?Protocols used: Back Pain-A-AH ? ?

## 2021-09-08 ENCOUNTER — Telehealth: Payer: Self-pay

## 2021-09-08 ENCOUNTER — Other Ambulatory Visit: Payer: Self-pay | Admitting: Nurse Practitioner

## 2021-09-08 ENCOUNTER — Ambulatory Visit: Payer: Medicaid Other | Attending: Nurse Practitioner

## 2021-09-08 VITALS — BP 131/90 | HR 96

## 2021-09-08 DIAGNOSIS — M6281 Muscle weakness (generalized): Secondary | ICD-10-CM | POA: Diagnosis present

## 2021-09-08 DIAGNOSIS — R2689 Other abnormalities of gait and mobility: Secondary | ICD-10-CM

## 2021-09-08 DIAGNOSIS — R29898 Other symptoms and signs involving the musculoskeletal system: Secondary | ICD-10-CM

## 2021-09-08 DIAGNOSIS — R2681 Unsteadiness on feet: Secondary | ICD-10-CM | POA: Diagnosis present

## 2021-09-08 DIAGNOSIS — I69354 Hemiplegia and hemiparesis following cerebral infarction affecting left non-dominant side: Secondary | ICD-10-CM | POA: Insufficient documentation

## 2021-09-08 DIAGNOSIS — R262 Difficulty in walking, not elsewhere classified: Secondary | ICD-10-CM

## 2021-09-08 NOTE — Telephone Encounter (Signed)
Bertram Denver, NP,  Shirley Hansen was evaluated by Physical Therapy on 09/08/21.  The patient would benefit from Occupational Therapy evaluation for LUE Weakness/Coordination Deficits.    If you agree, please place an order in [facility] workque in [facility] or fax the order to [phone]. Thank you, Adelfa Koh, PT, DPT  [facility] [address] [place], [place]  [address] Phone:  [phone] Fax:  [phone]

## 2021-09-08 NOTE — Therapy (Signed)
?OUTPATIENT PHYSICAL THERAPY NEURO EVALUATION ? ? ?Patient Name: Shirley Hansen ?MRN: [id number] ?DOB:07-23-1965, 56 y.o., female ?Today's Date: 09/08/2021 ? ?PCP: Gildardo Pounds, NP ?REFERRING PROVIDER: Gildardo Pounds, NP ? ? PT End of Session - 09/08/21 1533   ? ? Visit Number 1   ? Number of Visits 17   ? Date for PT Re-Evaluation 11/11/21   pushed out due to scheduling  ? Authorization Type U[employer] Medicaid; VL: 27   ? PT Start Time 1532   ? PT Stop Time 1615   ? PT Time Calculation (min) 43 min   ? Equipment Utilized During Treatment Gait belt   ? Activity Tolerance Patient tolerated treatment well   ? Behavior During Therapy [facility] for tasks assessed/performed   ? ?  ?  ? ?  ? ? ?Past Medical History:  ?Diagnosis Date  ? Hypertension   ? Pancreatitis   ? Stage 3a chronic kidney disease ([place])   ? Stroke [facility])   ? ?Past Surgical History:  ?Procedure Laterality Date  ? TUBAL LIGATION    ? ?Patient Active Problem List  ? Diagnosis Date Noted  ? Acute encephalopathy 04/09/2021  ? Hypertensive urgency 04/09/2021  ? Hemiparesis affecting left side as late effect of cerebrovascular accident (CVA) ([place]) 04/07/2021  ? Muscle spasticity 04/07/2021  ? History of stroke 03/23/2021  ? Chronic left shoulder pain 03/23/2021  ? Depression 03/23/2021  ? Glaucoma of both eyes 03/23/2021  ? Dizziness 03/13/2021  ? Orthostatic hypotension 03/13/2021  ? Weakness 03/05/2021  ? COVID-19 07/14/2020  ? Stroke [facility])   ? Stage 3a chronic kidney disease ([place])   ? Hypertension   ? ? ?ONSET DATE: 08/09/21 (Referral Date; Chronic CVA)  ? ?REFERRING DIAG: I69.354 (ICD-10-CM) - Hemiparesis affecting left side as late effect of cerebrovascular accident (CVA) ([place]) ? ?THERAPY DIAG:  ?Difficulty in walking, not elsewhere classified ? ?Other abnormalities of gait and mobility ? ?Unsteadiness on feet ? ?Muscle weakness (generalized) ? ?SUBJECTIVE:  ?                                                                                                                                                                                            ? ?SUBJECTIVE STATEMENT: ?Patient had a stroke in Nov 2020. Patient received home health PT services reported but unsure of when last visit was, believes it was last Sept. No therapy in 2023 received. Patient reports that since therapy has stopped she does not feel like she has made much progress, which is why she wanted to come to outpatient PT services. Patient reports she is using a RW to get  back and forth from the bathroom upstairs, but uses the manual w/c on the downstairs mainly. Does not feel comfortable walking by herself at times due to having a fall in Sept 2022. Reports residual weakness down the L side, and difficulties with the LUE, interested in OT services.  ?Pt accompanied by: self ? ?PERTINENT HISTORY: HTN, Pancreatisis, CVA, Stage III CKD ? ?PAIN:  ?Are you having pain? Yes: NPRS scale: 8/10 ?Pain location: Generalized; L Sided Pain From Shoulder to Hip ?Pain description: Stiffness ?Aggravating factors: Prolonged Position ?Relieving factors: Movement ? ?PRECAUTIONS: Fall ? ?WEIGHT BEARING RESTRICTIONS No ? ?FALLS: Has patient fallen in last 6 months? No ? ?LIVING ENVIRONMENT: ?Lives with: lives with their family; lives with her niece and brother in law ?Lives in: House/apartment ?Stairs: Yes: Internal: 12 steps; on right going up ?Has following equipment at home: Single point cane, Walker - 2 wheeled, and Wheelchair (manual) ? ?PLOF: Independent with household mobility with device and Requires assistive device for independence ? ?PATIENT GOALS: Get Stronger; Walk More ? ?OBJECTIVE:  ? ? ?COGNITION: ?Overall cognitive status: No family/caregiver present to determine baseline cognitive functioning ?  ?SENSATION: ?Light touch: Impaired  on LLE ? ?COORDINATION: ?Grossly Impaired Coordination on LLE > RLE; Spasms noted with coordination testings. Significant coordination deficits noted in LUE.  ? ?EDEMA:  ?Mild Pitting Edema in  L Ankle ? ?MUSCLE TONE: LLE: Moderate and Hypertonic ? ? ?POSTURE: rounded shoulders and forward head ? ?MMT:   ? ?MMT Right ?09/08/2021 Left ?09/08/2021  ?Hip flexion 5/5 4-/5  ?Hip extension    ?Hip abduction 4+/5 4-/5  ?Hip adduction    ?Hip internal rotation    ?Hip external rotation    ?Knee flexion 5/5 4/5  ?Knee extension 5/5 4/5  ?Ankle dorsiflexion    ?Ankle plantarflexion    ?Ankle inversion    ?Ankle eversion    ?(Blank rows = not tested) ? ?BED MOBILITY:  ?Reports bed was positioned on the floor; still there currently. Having difficulty getting up and into the w/c due to this.  ? ?TRANSFERS: ?Assistive device utilized: Environmental consultant - 2 wheeled  ?Sit to stand: CGA ?Stand to sit: CGA ? ?GAIT: ?Gait pattern: step through pattern, decreased arm swing- Right, decreased arm swing- Left, decreased step length- Right, decreased step length- Left, decreased stance time- Left, and decreased hip/knee flexion- Left ?Distance walked: 107 ?Assistive device utilized: Environmental consultant - 2 wheeled ?Level of assistance: CGA ?Comments: With use of RW, mild ataxia noted in LUE requiring increased time getting hand placed on RW ?Gait Speed: 70.34 seconds = 0.46 ft/sec ? ?FUNCTIONAL TESTs:  ?5 times sit to stand: 51.81 secs with BUE support, increased time due to difficulty with LUE placement on w/c and utilization to push into standing.  ? ? ?PATIENT EDUCATION: ?Education details: Educated on POC/Eval Findings ?Person educated: Patient ?Education method: Explanation ?Education comprehension: verbalized understanding ? ? ?HOME EXERCISE PROGRAM: ?To Be Established ? ? ? ?GOALS: ?Goals reviewed with patient? Yes ? ?SHORT TERM GOALS: Target date: 10/07/2021 ? ?Pt will be independent with initial HEP for improved strengthening and balance  ?Baseline: No HEP established ?Goal status: INITIAL ? ?2.  Pt will improve gait speed to >/= 0.75 ft/sec to demonstrate improved community ambulation ?Baseline: 0.46 ft/sec ?Goal status: INITIAL ? ?3.  Pt will  improve 5x STS to </= 45 sec to demo improved functional LE strength and balance  ?Baseline: 51.81 ?Goal status: INITIAL ? ?4.  Berg TBA and STG/LTG to be updated ?Baseline: TBA ?  Goal status: INITIAL ? ?5.  TUG TBA and STG/LTG to be updated ?Baseline: TBA ?Goal status: INITIAL ? ?6.  Pt will be able to ambulate >/= 300 ft with LRAD and CGA to demo improved household mobility ?Baseline: 107 ft with RW and CGA ?Goal status: INITIAL ? ?LONG TERM GOALS: Target date: 11/04/2021 ? ?Pt will be independent with final progressive HEP for improved strengthening and balance  ?Baseline: No HEP established ?Goal status: INITIAL ? ?2.  Pt will improve gait speed to >/= 1.0 ft/sec to demonstrate improved community ambulation ?Baseline: 0.46 ft/sec ?Goal status: INITIAL ? ?3.  Pt will improve 5x STS to </= 35 sec to demo improved functional LE strength and balance  ?Baseline: 51.81 secs ?Goal status: INITIAL ? ?4.  Berg TBA and STG/LTG to be updated ?Baseline: TBA ?Goal status: INITIAL ? ?5.  TUG TBA and STG/LTG to be updated ?Baseline: TBA ?Goal status: INITIAL ? ?6.  Pt will be able to ambulate >/= 500 ft with LRAD and supervison to demo improved household mobility ?Baseline: 107 ft with CGA and RW ?Goal status: INITIAL ? ?ASSESSMENT: ? ?CLINICAL IMPRESSION: ?Patient is a 56 y.o. female referred to Neuro OPPT services for L Hemiparesis due to Chronic CVA. Patient's PMH significant for the following: HTN, Pancreatisis, CVA, Stage III CKD. Upon evaluation, patient presents with the following impairments: impaired sensation, impaired coordination, decreased balance, decreased strength, abnormal gait, decreased functional mobility and high fall risk. Pt is currently ambulating at 0.46 ft/sec with RW. Pt demo high fall risk with 5x sit <> stand time of 51.81 seconds. Patient will benefit from skilled PT services to address impairments.  ? ? ? ?OBJECTIVE IMPAIRMENTS Abnormal gait, decreased activity tolerance, decreased balance,  decreased coordination, decreased endurance, decreased knowledge of use of DME, decreased mobility, difficulty walking, decreased strength, decreased safety awareness, increased edema, impaired sensation, im

## 2021-09-12 ENCOUNTER — Ambulatory Visit: Payer: Medicaid Other | Admitting: Occupational Therapy

## 2021-09-12 ENCOUNTER — Encounter: Payer: Self-pay | Admitting: Occupational Therapy

## 2021-09-12 ENCOUNTER — Ambulatory Visit: Payer: Medicaid Other | Attending: Nurse Practitioner | Admitting: Physical Therapy

## 2021-09-12 ENCOUNTER — Encounter: Payer: Self-pay | Admitting: Physical Therapy

## 2021-09-12 DIAGNOSIS — R208 Other disturbances of skin sensation: Secondary | ICD-10-CM | POA: Insufficient documentation

## 2021-09-12 DIAGNOSIS — R278 Other lack of coordination: Secondary | ICD-10-CM | POA: Diagnosis present

## 2021-09-12 DIAGNOSIS — R262 Difficulty in walking, not elsewhere classified: Secondary | ICD-10-CM | POA: Diagnosis present

## 2021-09-12 DIAGNOSIS — M6281 Muscle weakness (generalized): Secondary | ICD-10-CM

## 2021-09-12 DIAGNOSIS — R2689 Other abnormalities of gait and mobility: Secondary | ICD-10-CM | POA: Diagnosis present

## 2021-09-12 DIAGNOSIS — R2681 Unsteadiness on feet: Secondary | ICD-10-CM

## 2021-09-12 DIAGNOSIS — I69854 Hemiplegia and hemiparesis following other cerebrovascular disease affecting left non-dominant side: Secondary | ICD-10-CM

## 2021-09-12 NOTE — Therapy (Signed)
?OUTPATIENT OCCUPATIONAL THERAPY NEURO EVALUATION ? ?Patient Name: Shirley Hansen ?MRN: [id number] ?DOB:[birth year], 56 y.o., female ?Today's Date: 09/13/2021 ? ?PCP: Gildardo Pounds, NP ?REFERRING PROVIDER: Gildardo Pounds, NP ? ? OT End of Session - 09/12/21 1600   ? ? Visit Number 1   ? Number of Visits 17   ? Date for OT Re-Evaluation 11/24/21   16 visits over 10 weeks d/t any scheduling conflicts  ? Authorization Type UHC Medicaid (pt reports Medicare will activate May 1st)   ? Authorization Time Period $4 copay VL: 27 combined   ? OT Start Time [id number]   ? OT Stop Time 1645   ? OT Time Calculation (min) 40 min   ? Activity Tolerance Patient tolerated treatment well   ? Behavior During Therapy [facility] for tasks assessed/performed   ? ?  ?  ? ?  ? ? ?Past Medical History:  ?Diagnosis Date  ? Hypertension   ? Pancreatitis   ? Stage 3a chronic kidney disease ([place])   ? Stroke [facility])   ? ?Past Surgical History:  ?Procedure Laterality Date  ? TUBAL LIGATION    ? ?Patient Active Problem List  ? Diagnosis Date Noted  ? Acute encephalopathy 04/09/2021  ? Hypertensive urgency 04/09/2021  ? Hemiparesis affecting left side as late effect of cerebrovascular accident (CVA) ([place]) 04/07/2021  ? Muscle spasticity 04/07/2021  ? History of stroke 03/23/2021  ? Chronic left shoulder pain 03/23/2021  ? Depression 03/23/2021  ? Glaucoma of both eyes 03/23/2021  ? Dizziness 03/13/2021  ? Orthostatic hypotension 03/13/2021  ? Weakness 03/05/2021  ? COVID-19 07/14/2020  ? Stroke [facility])   ? Stage 3a chronic kidney disease ([place])   ? Hypertension   ? ? ?ONSET DATE: Patient had a stroke in Nov 2020, referral 09/08/2021  ? ?REFERRING DIAG: R29.898 (ICD-10-CM) - LUE weakness  ? ?THERAPY DIAG:  ?Hemiplegia and hemiparesis following other cerebrovascular disease affecting left non-dominant side ([place]) ? ?Other lack of coordination ? ?Muscle weakness (generalized) ? ?Unsteadiness on feet ? ?Other disturbances of skin sensation ? ?SUBJECTIVE:  ? ?SUBJECTIVE  STATEMENT: ?Pt is a 56 year old female that had a CVA in November of 2020. Pt received home health services. Pt was evaluated by PT on 09/08/21 at this clinic and expressed interest in OT services d/t LUE weakness. Referral received same day.Pt reports some stiffness in the LUE shoulder and arm.  ? ?Pt accompanied by: self ? ?PERTINENT HISTORY: HTN, Pancreatisis, CVA, Stage III CKD  ? ?PRECAUTIONS: Fall ? ?WEIGHT BEARING RESTRICTIONS No ? ?PAIN:  ?Are you having pain? No ? ?FALLS: Has patient fallen in last 6 months? No ? ?LIVING ENVIRONMENT: ?Lives with: lives with their family and her niece and brother in law , 3 dogs ?Lives in: House/apartment ?Stairs: Yes: Internal: 12  steps; on right going up ?Has following equipment at home: Single point cane, Walker - 2 wheeled, and Wheelchair (manual) ? ?PLOF: Independent with household mobility with device, Requires assistive device for independence, Vocation/Vocational requirements: not currently working, and Leisure: fish ? ?PATIENT GOALS "to be able to use my hands again" ? ?OBJECTIVE:  ? ?HAND DOMINANCE: Right ? ?ADLs: ?Overall ADLs: pt reports completing ADLs independently with use of LRAD ?Transfers/ambulation related to ADLs: ?Eating: needs some assistance with cutting food ?Grooming: niece assists with shaving and styling hair ?UB Dressing: Independent ?LB Dressing: Independent ?Toileting: Independent ?Bathing: Mod I w BSC  ?Tub Shower transfers: mod I - set up assistance of BSC in shower ?Equipment:  bed side commode ? ? ?IADLs: ?Shopping: niece is completing currently ?Light housekeeping: doing own cleaning and laundry, washing dishes independently ?Meal Prep: currently cooking but needs some assistance with peeling/chopping ?Community mobility: relies on family and friends ?Medication management: opening medications independently ?Financial management:  ?Handwriting:  not tested ? ?MOBILITY STATUS: Independent - navigates in manual w/c upstairs and downstairs.  Uses 2wRW when downstairs  ? ?POSTURE COMMENTS:  ?rounded shoulders ? ?UE ROM   : trigger finger in LUE long finger  ? ?Active ROM Right ?09/13/2021 Left ?09/13/2021  ?Shoulder flexion WFL   ?Shoulder abduction    ?Shoulder adduction    ?Shoulder extension    ?Shoulder internal rotation  limited  ?Shoulder external rotation    ?Elbow flexion    ?Elbow extension    ?Wrist flexion    ?Wrist extension    ?Wrist ulnar deviation    ?Wrist radial deviation    ?Wrist pronation    ?Wrist supination    ?(Blank rows = not tested) ? ? ?UE MMT:    ? ?MMT Right ?09/13/2021 Left ?09/13/2021  ?Shoulder flexion WFL 4/5  ?Shoulder abduction  4-/5  ?Shoulder adduction    ?Shoulder extension    ?Shoulder internal rotation    ?Shoulder external rotation    ?Middle trapezius    ?Lower trapezius    ?Elbow flexion  5/5  ?Elbow extension  5/5  ?Wrist flexion    ?Wrist extension    ?Wrist ulnar deviation    ?Wrist radial deviation    ?Wrist pronation    ?Wrist supination    ?(Blank rows = not tested) ? ?HAND FUNCTION: ?Grip strength: Right: 70.3 lbs; Left: 34.1 lbs ? ?COORDINATION: Ataxie present in LUE. ?Finger Nose Finger test: Dysmetria present in LUE ?9 Hole Peg test: Right: 23.82 sec; Left: 0 pegs in 60 sec ?Box and Blocks:  Right 56 blocks, Left 15 blocks ? ?SENSATION: reports pins and needles in LUE ?Light touch: WFL ?Hot/Cold: Impaired  ?Proprioception: WFL ? ? ? ?MUSCLE TONE: LUE: Within functional limits and Mild ? ?COGNITION: ?Overall cognitive status: Within functional limits for tasks assessed, No family/caregiver present to determine baseline cognitive functioning, and reports "sometimes forgetting stuff but no very often" ? ?VISION: ?Subjective report: left eye giving some problems per report but is following up with Opthamologist ?Baseline vision: Wears glasses all the time ? ? ?VISION ASSESSMENT: ?WFL ? ? ?PERCEPTION: Impaired: Inattention/neglect: does not attend to left side of body but minimal ? ?PRAXIS: Not tested ? ? ?TODAY'S  TREATMENT:  ?None on evaluation 09/12/21 ? ? ?PATIENT EDUCATION: ?Education details: Education on role and purpose of OT ?Person educated: Patient ?Education method: Explanation ?Education comprehension: verbalized understanding ? ? ?HOME EXERCISE PROGRAM: ?To Be Established ? ? ? ?GOALS: ?Goals reviewed with patient? No ? ?SHORT TERM GOALS: Target date: 10/11/2021 ? ?Pt will be independent with HEP. ?Baseline: not issued at evaluation ?Goal status: INITIAL ? ?2.  Pt will increase box and blocks score by 4 blocks or greater with LUE. ?Baseline: Right 56 blocks, Left 15 blocks ?Goal status: INITIAL ? ?3.  Pt will improve grip strength by 5 lbs or greater with LUE. ?Baseline: Right: 70.3 lbs; Left: 34.1 lbs ?Goal status: INITIAL ? ?4.  Pt will verbalize understanding of adapted strategies and/or equipment PRN to increase safety and independence with ADLs and IADLs (I.e. chopping and peeling for meal prep) ?Baseline: difficulty with chopping vegetables and peeling ?Goal status: INITIAL ? ? ?LONG TERM GOALS: Target date: 11/24/21 ? ?  Pt will be independent with updated HEP. ?Baseline: not issued at evaluation ?Goal status: INITIAL ? ?2.  Pt will increase fine motor coordination in LUE by improving 9 hole peg test by achieving 5 pegs in 60 seconds or less. ?Baseline: Right: 23.82 sec; Left: 0 pegs in 60 sec ?Goal status: INITIAL ? ?3.  Pt will increase box and blocks score by 10 blocks or greater with LUE. ?Baseline: Right 56 blocks, Left 15 blocks ?Goal status: INITIAL ? ?4.  Pt will improve grip strength by 10 lbs or greater with LUE ?Baseline: Right: 70.3 lbs; Left: 34.1 lbs ?Goal status: INITIAL ? ?5.  Pt will perform all basic ADLs with mod I ?Baseline: continues to need some help with grooming and fasteners ?Goal status: INITIAL ? ?ASSESSMENT: ? ?CLINICAL IMPRESSION: ?Patient is a 56 y.o. female who was seen today for occupational therapy evaluation for LUE weakness. PMH significant for HTN, Pancreatisis, CVA, Stage III  CKD. Pt presents with deficits with LUE coordination, strength and range of motion/stiffness with decreased sensation. Skilled occupational therapy is recommended to target listed areas of deficit and incre

## 2021-09-12 NOTE — Therapy (Signed)
?OUTPATIENT PHYSICAL THERAPY TREATMENT NOTE ? ? ?Patient Name: Shirley Hansen ?MRN: [id number] ?DOB:May 22, 1966, 56 y.o., female ?Today's Date: 09/12/2021 ? ?PCP: Gildardo Pounds, NP ?REFERRING PROVIDER: Gildardo Pounds, NP ? ? PT End of Session - 09/12/21 1520   ? ? Visit Number 2   ? Number of Visits 17   ? Date for PT Re-Evaluation 11/11/21   pushed out due to scheduling  ? Authorization Type UHC Medicaid; VL: 27   ? PT Start Time [id number]   ? PT Stop Time 1600   ? PT Time Calculation (min) 42 min   ? Equipment Utilized During Treatment Gait belt   ? Activity Tolerance Patient tolerated treatment well   ? Behavior During Therapy [facility] for tasks assessed/performed   ? ?  ?  ? ?  ? ? ?Past Medical History:  ?Diagnosis Date  ? Hypertension   ? Pancreatitis   ? Stage 3a chronic kidney disease ([place])   ? Stroke [facility])   ? ?Past Surgical History:  ?Procedure Laterality Date  ? TUBAL LIGATION    ? ?Patient Active Problem List  ? Diagnosis Date Noted  ? Acute encephalopathy 04/09/2021  ? Hypertensive urgency 04/09/2021  ? Hemiparesis affecting left side as late effect of cerebrovascular accident (CVA) ([place]) 04/07/2021  ? Muscle spasticity 04/07/2021  ? History of stroke 03/23/2021  ? Chronic left shoulder pain 03/23/2021  ? Depression 03/23/2021  ? Glaucoma of both eyes 03/23/2021  ? Dizziness 03/13/2021  ? Orthostatic hypotension 03/13/2021  ? Weakness 03/05/2021  ? COVID-19 07/14/2020  ? Stroke [facility])   ? Stage 3a chronic kidney disease ([place])   ? Hypertension   ? ? ?REFERRING DIAG: I69.354 (ICD-10-CM) - Hemiparesis affecting left side as late effect of cerebrovascular accident (CVA) ([place]) ? ?THERAPY DIAG:  ?Other abnormalities of gait and mobility ? ?Unsteadiness on feet ? ?Muscle weakness (generalized) ? ?PERTINENT HISTORY: HTN, Pancreatisis, CVA, Stage III CKD ? ?PRECAUTIONS: Fall ? ?SUBJECTIVE: Pt did not feel well this morning, is feeling some better now.  She states she was just "feeling lousy and slow". ? ?PAIN:  ?Are you  having pain? Yes: NPRS scale: 2/10 ?Pain location: L shoulder and Left midline of ribs to L hip ?Pain description: burning ?Aggravating factors: being cold ?Relieving factors: heat ? TODAY'S TREATMENT:   ? Assessed BERG (high fall risk) and TUG. ?  ? [facility] PT Assessment - 09/12/21 1533   ? ?  ? Balance  ? Balance Assessed Yes   ?  ? Standardized Balance Assessment  ? Standardized Balance Assessment Berg Balance Test   ?  ? Berg Balance Test  ? Sit to Stand Able to stand  independently using hands   and RW  ? Standing Unsupported Able to stand safely 2 minutes   ? Sitting with Back Unsupported but Feet Supported on Floor or Stool Able to sit safely and securely 2 minutes   ? Stand to Sit Controls descent by using hands   ? Transfers Able to transfer with verbal cueing and /or supervision   w/ RW stand step  ? Standing Unsupported with Eyes Closed Able to stand 10 seconds with supervision   ? Standing Unsupported with Feet Together Needs help to attain position and unable to hold for 15 seconds   ? From Standing, Reach Forward with Outstretched Arm Reaches forward but needs supervision   ? From Standing Position, Pick up Object from Floor Unable to pick up shoe, but reaches 2-5 cm (1-2") from shoe  and balances independently   ? From Standing Position, Turn to Look Behind Over each Shoulder Turn sideways only but maintains balance   ? Turn 360 Degrees Needs close supervision or verbal cueing   ? Standing Unsupported, Alternately Place Feet on Step/Stool Able to complete >2 steps/needs minimal assist   ? Standing Unsupported, One Foot in [employer] balance while stepping or standing   ? Standing on One Leg Tries to lift leg/unable to hold 3 seconds but remains standing independently   ? Total Score 27   ? ?  ?  ? ?  ?TUG:  50.52sec ?Initiated HEP.  See below. ? ?PATIENT EDUCATION: ?Education details: Edu on BERG and TUG assessments and goals; initial HEP established with instructions for supervision from niece for  safety. ?Person educated: Patient ?Education method: Explanation ?Education comprehension: verbalized understanding ?  ?  ?HOME EXERCISE PROGRAM: ?Access Code: [email or web address] ?URL: [email or web address] ?Date: 09/12/2021 ?Prepared by: Elease Etienne ? ?Exercises ?- Seated Long Arc Quad  - 1 x daily - 4 x weekly - 2 sets - 12 reps - 2 seconds hold ?- Sit to Stand with Armchair  - 1 x daily - 4 x weekly - 2 sets - 6 reps ?- Standing March with Counter Support  - 1 x daily - 4 x weekly - 2 sets - 20 reps  ?  ?  ?GOALS: ?Goals reviewed with patient? Yes ?  ?SHORT TERM GOALS: Target date: 10/07/2021 ?  ?Pt will be independent with initial HEP for improved strengthening and balance  ?Baseline: No HEP established ?Goal status: INITIAL ?  ?2.  Pt will improve gait speed to >/= 0.75 ft/sec to demonstrate improved community ambulation ?Baseline: 0.46 ft/sec ?Goal status: INITIAL ?  ?3.  Pt will improve 5x STS to </= 45 sec to demo improved functional LE strength and balance  ?Baseline: 51.81 ?Goal status: INITIAL ?  ?4.  Pt will increase BERG balance score to 30/56 to demonstrate improved static balance. ?Baseline: 27/56 ?Goal status: INITIAL ?  ?5.  Pt will demonstrate TUG of <45 seconds in order to decrease risk of falls and improve functional mobility using LRAD. ?Baseline: 50.52sec ?Goal status: INITIAL ?  ?6.  Pt will be able to ambulate >/= 300 ft with LRAD and CGA to demo improved household mobility ?Baseline: 107 ft with RW and CGA ?Goal status: INITIAL ?  ?LONG TERM GOALS: Target date: 11/04/2021 ?  ?Pt will be independent with final progressive HEP for improved strengthening and balance  ?Baseline: No HEP established ?Goal status: INITIAL ?  ?2.  Pt will improve gait speed to >/= 1.0 ft/sec to demonstrate improved community ambulation ?Baseline: 0.46 ft/sec ?Goal status: INITIAL ?  ?3.  Pt will improve 5x STS to </= 35 sec to demo improved functional LE strength and balance  ?Baseline: 51.81 secs ?Goal  status: INITIAL ?  ?4.  Pt will increase BERG balance score to 33/56 to demonstrate improved static balance. ?Baseline: TBA ?Goal status: INITIAL ?  ?5.  Pt will demonstrate TUG of <40 seconds in order to decrease risk of falls and improve functional mobility using LRAD. ?Baseline: 50.52sec ?Goal status: INITIAL ?  ?6.  Pt will be able to ambulate >/= 500 ft with LRAD and supervison to demo improved household mobility ?Baseline: 107 ft with CGA and RW ?Goal status: INITIAL ?  ?ASSESSMENT: ?  ?CLINICAL IMPRESSION: ?Further assessment of fall risk via the BERG indicates pt is a high fall risk due to score of 27/56.  TUG completed in 50.52 sec with pt demo difficulties with stride length, narrow BOS, and turning to L>to R using RW.  Initial HEP initiated to promote LE strength.  Pt requires inc time to complete tasks and mild redirection due to hyperverbosity.  Continue per POC. ?  ?  ?  ?OBJECTIVE IMPAIRMENTS Abnormal gait, decreased activity tolerance, decreased balance, decreased coordination, decreased endurance, decreased knowledge of use of DME, decreased mobility, difficulty walking, decreased strength, decreased safety awareness, increased edema, impaired sensation, impaired tone, postural dysfunction, and pain.  ?  ?ACTIVITY LIMITATIONS cleaning, meal prep, laundry, and medication management.  ?  ?PERSONAL FACTORS Time since onset of injury/illness/exacerbation and 3+ comorbidities: HTN, Pancreatisis, CVA, Stage III CKD  are also affecting patient's functional outcome.  ?  ?  ?REHAB POTENTIAL: Good ?  ?CLINICAL DECISION MAKING: Evolving/moderate complexity ?  ?EVALUATION COMPLEXITY: Moderate ?  ?PLAN: ?PT FREQUENCY: 2x/week ?  ?PT DURATION: 8 weeks (may need to decreased duration based upon OT and Pelvic Floor Needs ?  ?PLANNED INTERVENTIONS: Therapeutic exercises, Therapeutic activity, Neuromuscular re-education, Balance training, Gait training, Patient/Family education, Joint manipulation, Joint  mobilization, Stair training, Vestibular training, Orthotic/Fit training, DME instructions, Aquatic Therapy, Cryotherapy, and Moist heat ?  ?PLAN FOR NEXT SESSION: Update HEP for Strength LLE > RLE.  Gait training w/ RW-esp tu

## 2021-09-12 NOTE — Patient Instructions (Signed)
Access Code: [email or web address] ?URL: [email or web address] ?Date: 09/12/2021 ?Prepared by: Camille Bal ? ?Exercises ?- Seated Long Arc Quad  - 1 x daily - 4 x weekly - 2 sets - 12 reps - 2 seconds hold ?- Sit to Stand with Armchair  - 1 x daily - 4 x weekly - 2 sets - 6 reps ?- Standing March with Counter Support  - 1 x daily - 4 x weekly - 2 sets - 20 reps ?

## 2021-09-14 ENCOUNTER — Encounter: Payer: Self-pay | Admitting: Physical Therapy

## 2021-09-14 ENCOUNTER — Ambulatory Visit: Payer: Medicaid Other | Admitting: Physical Therapy

## 2021-09-14 DIAGNOSIS — I69854 Hemiplegia and hemiparesis following other cerebrovascular disease affecting left non-dominant side: Secondary | ICD-10-CM

## 2021-09-14 DIAGNOSIS — R278 Other lack of coordination: Secondary | ICD-10-CM

## 2021-09-14 DIAGNOSIS — R2689 Other abnormalities of gait and mobility: Secondary | ICD-10-CM | POA: Diagnosis not present

## 2021-09-14 DIAGNOSIS — R2681 Unsteadiness on feet: Secondary | ICD-10-CM

## 2021-09-14 DIAGNOSIS — M6281 Muscle weakness (generalized): Secondary | ICD-10-CM

## 2021-09-14 NOTE — Patient Instructions (Signed)
Access Code: [email or web address] ?URL: [email or web address] ?Date: 09/14/2021 ?Prepared by: Elease Etienne ? ?Exercises ?- Seated Long Arc Quad  - 1 x daily - 4 x weekly - 2 sets - 12 reps - 2 seconds hold ?- Sit to Stand with Armchair  - 1 x daily - 4 x weekly - 2 sets - 6 reps ?- Standing March with Counter Support  - 1 x daily - 4 x weekly - 2 sets - 20 reps ?- Supine Bridge  - 1 x daily - 5 x weekly - 2 sets - 12 reps ?- Lower Trunk Rotations  - 1 x daily - 4 x weekly - 2 sets - 10 reps ?- Supine March  - 1 x daily - 3 x weekly - 2 sets - 10 reps ?- Seated Hamstring Curls with Resistance  - 1 x daily - 4 x weekly - 1 sets - 12 reps ?- Standing Single Leg Stance with Counter Support  - 1 x daily - 7 x weekly - 2 sets - 2 reps - 30 seconds hold ?

## 2021-09-14 NOTE — Therapy (Signed)
?OUTPATIENT PHYSICAL THERAPY TREATMENT NOTE ? ? ?Patient Name: Shirley Hansen ?MRN: [id number] ?DOB:04/06/1966, 56 y.o., female ?Today's Date: 09/14/2021 ? ?PCP: Gildardo Pounds, NP ?REFERRING PROVIDER: Gildardo Pounds, NP ? ? PT End of Session - 09/14/21 1451   ? ? Visit Number 3   ? Number of Visits 17   ? Date for PT Re-Evaluation 11/11/21   pushed out due to scheduling  ? Authorization Type UHC Medicaid; VL: 27   ? PT Start Time [id number]   ? PT Stop Time 1528   ? PT Time Calculation (min) 41 min   ? Equipment Utilized During Treatment Gait belt   ? Activity Tolerance Patient tolerated treatment well   ? Behavior During Therapy [facility] for tasks assessed/performed   ? ?  ?  ? ?  ? ? ?Past Medical History:  ?Diagnosis Date  ? Hypertension   ? Pancreatitis   ? Stage 3a chronic kidney disease ([place])   ? Stroke [facility])   ? ?Past Surgical History:  ?Procedure Laterality Date  ? TUBAL LIGATION    ? ?Patient Active Problem List  ? Diagnosis Date Noted  ? Acute encephalopathy 04/09/2021  ? Hypertensive urgency 04/09/2021  ? Hemiparesis affecting left side as late effect of cerebrovascular accident (CVA) ([place]) 04/07/2021  ? Muscle spasticity 04/07/2021  ? History of stroke 03/23/2021  ? Chronic left shoulder pain 03/23/2021  ? Depression 03/23/2021  ? Glaucoma of both eyes 03/23/2021  ? Dizziness 03/13/2021  ? Orthostatic hypotension 03/13/2021  ? Weakness 03/05/2021  ? COVID-19 07/14/2020  ? Stroke [facility])   ? Stage 3a chronic kidney disease ([place])   ? Hypertension   ? ? ?REFERRING DIAG: I69.354 (ICD-10-CM) - Hemiparesis affecting left side as late effect of cerebrovascular accident (CVA) ([place]) ? ?THERAPY DIAG:  ?Hemiplegia and hemiparesis following other cerebrovascular disease affecting left non-dominant side ([place]) ? ?Other lack of coordination ? ?Muscle weakness (generalized) ? ?Unsteadiness on feet ? ?PERTINENT HISTORY: HTN, Pancreatisis, CVA, Stage III CKD ? ?PRECAUTIONS: Fall ? ?SUBJECTIVE: Pt states she is feeling off today due to  blurry vision.  She has some midline back stiffness and burning.  She thinks it's her bed  ? ?PAIN:  ?Are you having pain? Yes: NPRS scale: 1/10 ?Pain location: midline low back ?Pain description: burning ?Aggravating factors: being cold ?Relieving factors: heat ? TODAY'S TREATMENT:   ?Added to HEP: ?Low trunk rotations x10 each side, tactile cuing for TrA activation ?Supine marching x20 each leg, cuing for core engagement ?Supine briding x12, added strap around thighs to improve LLE control ? Reviewed log roll technique when returning to EOM. ? Seated hamstring curl x12 each LE using red theraband ? SLS w/ counter support 2x30 seconds each LE.   ?Reviewed standing marches x20 w/ BUE fingertip support. ? ? ? ? ?PATIENT EDUCATION: ?Education details: Additions to HEP. ?Person educated: Patient ?Education method: Explanation ?Education comprehension: verbalized understanding ?  ?  ?HOME EXERCISE PROGRAM: ?Access Code: [email or web address] ?URL: [email or web address] ?Date: 09/12/2021 ?Prepared by: Elease Etienne ? ?Exercises ?- Seated Long Arc Quad  - 1 x daily - 4 x weekly - 2 sets - 12 reps - 2 seconds hold ?- Sit to Stand with Armchair  - 1 x daily - 4 x weekly - 2 sets - 6 reps ?- Standing March with Counter Support  - 1 x daily - 4 x weekly - 2 sets - 20 reps  ? - Supine Bridge  - 1 x daily - 5 x weekly -  2 sets - 12 reps ?- Lower Trunk Rotations  - 1 x daily - 4 x weekly - 2 sets - 10 reps ?- Supine March  - 1 x daily - 3 x weekly - 2 sets - 10 reps ? - Seated Hamstring Curls with Resistance  - 1 x daily - 4 x weekly - 1 sets - 12 reps ?- Standing Single Leg Stance with Counter Support  - 1 x daily - 7 x weekly - 2 sets - 2 reps - 30 seconds hold ? ?GOALS: ?Goals reviewed with patient? Yes ?  ?SHORT TERM GOALS: Target date: 10/07/2021 ?  ?Pt will be independent with initial HEP for improved strengthening and balance  ?Baseline: No HEP established ?Goal status: INITIAL ?  ?2.  Pt will improve gait speed to  >/= 0.75 ft/sec to demonstrate improved community ambulation ?Baseline: 0.46 ft/sec ?Goal status: INITIAL ?  ?3.  Pt will improve 5x STS to </= 45 sec to demo improved functional LE strength and balance  ?Baseline: 51.81 ?Goal status: INITIAL ?  ?4.  Pt will increase BERG balance score to 30/56 to demonstrate improved static balance. ?Baseline: 27/56 ?Goal status: INITIAL ?  ?5.  Pt will demonstrate TUG of <45 seconds in order to decrease risk of falls and improve functional mobility using LRAD. ?Baseline: 50.52sec ?Goal status: INITIAL ?  ?6.  Pt will be able to ambulate >/= 300 ft with LRAD and CGA to demo improved household mobility ?Baseline: 107 ft with RW and CGA ?Goal status: INITIAL ?  ?LONG TERM GOALS: Target date: 11/04/2021 ?  ?Pt will be independent with final progressive HEP for improved strengthening and balance  ?Baseline: No HEP established ?Goal status: INITIAL ?  ?2.  Pt will improve gait speed to >/= 1.0 ft/sec to demonstrate improved community ambulation ?Baseline: 0.46 ft/sec ?Goal status: INITIAL ?  ?3.  Pt will improve 5x STS to </= 35 sec to demo improved functional LE strength and balance  ?Baseline: 51.81 secs ?Goal status: INITIAL ?  ?4.  Pt will increase BERG balance score to 33/56 to demonstrate improved static balance. ?Baseline: TBA ?Goal status: INITIAL ?  ?5.  Pt will demonstrate TUG of <40 seconds in order to decrease risk of falls and improve functional mobility using LRAD. ?Baseline: 50.52sec ?Goal status: INITIAL ?  ?6.  Pt will be able to ambulate >/= 500 ft with LRAD and supervison to demo improved household mobility ?Baseline: 107 ft with CGA and RW ?Goal status: INITIAL ?  ?ASSESSMENT: ?  ?CLINICAL IMPRESSION: ?Time spent making additions to patients HEP for bilateral LE strengthening and balance with emphasis on unilateral strength and SLS.  Pt continues to require intermittent redirection due to hyperverbosity. Continue per POC. ?  ?  ?  ?OBJECTIVE IMPAIRMENTS Abnormal gait,  decreased activity tolerance, decreased balance, decreased coordination, decreased endurance, decreased knowledge of use of DME, decreased mobility, difficulty walking, decreased strength, decreased safety awareness, increased edema, impaired sensation, impaired tone, postural dysfunction, and pain.  ?  ?ACTIVITY LIMITATIONS cleaning, meal prep, laundry, and medication management.  ?  ?PERSONAL FACTORS Time since onset of injury/illness/exacerbation and 3+ comorbidities: HTN, Pancreatisis, CVA, Stage III CKD  are also affecting patient's functional outcome.  ?  ?  ?REHAB POTENTIAL: Good ?  ?CLINICAL DECISION MAKING: Evolving/moderate complexity ?  ?EVALUATION COMPLEXITY: Moderate ?  ?PLAN: ?PT FREQUENCY: 2x/week ?  ?PT DURATION: 8 weeks (may need to decreased duration based upon OT and Pelvic Floor Needs ?  ?PLANNED INTERVENTIONS:  Therapeutic exercises, Therapeutic activity, Neuromuscular re-education, Balance training, Gait training, Patient/Family education, Joint manipulation, Joint mobilization, Stair training, Vestibular training, Orthotic/Fit training, DME instructions, Aquatic Therapy, Cryotherapy, and Moist heat ?  ?PLAN FOR NEXT SESSION: Update HEP for Strength LLE > RLE prn.  Gait training w/ RW-esp turning, trial NuStep/SciFit for endurance and reciprocal mobility as needed.  SLS-inc L stance time.  Core engagement. ? ? ? ? ? ?Elease Etienne, PT, DPT ?09/14/2021, 3:30 PM ? ?  ? ?

## 2021-09-20 ENCOUNTER — Encounter: Payer: Self-pay | Admitting: Occupational Therapy

## 2021-09-20 ENCOUNTER — Ambulatory Visit: Payer: Medicaid Other | Admitting: Occupational Therapy

## 2021-09-20 DIAGNOSIS — R2689 Other abnormalities of gait and mobility: Secondary | ICD-10-CM | POA: Diagnosis not present

## 2021-09-20 DIAGNOSIS — R278 Other lack of coordination: Secondary | ICD-10-CM

## 2021-09-20 DIAGNOSIS — R208 Other disturbances of skin sensation: Secondary | ICD-10-CM

## 2021-09-20 DIAGNOSIS — R2681 Unsteadiness on feet: Secondary | ICD-10-CM

## 2021-09-20 DIAGNOSIS — M6281 Muscle weakness (generalized): Secondary | ICD-10-CM

## 2021-09-20 DIAGNOSIS — I69854 Hemiplegia and hemiparesis following other cerebrovascular disease affecting left non-dominant side: Secondary | ICD-10-CM

## 2021-09-20 NOTE — Therapy (Signed)
?OUTPATIENT OCCUPATIONAL THERAPY TREATMENT NOTE ? ? ?Patient Name: Shirley Hansen ?MRN: [id number] ?DOB:03-12-1966, 56 y.o., female ?Today's Date: 09/20/2021 ? ?PCP: Gildardo Pounds, NP ?REFERRING PROVIDER: Gildardo Pounds, NP ? ?END OF SESSION:  ? OT End of Session - 09/20/21 1331   ? ? Visit Number 2   ? Number of Visits 17   ? Date for OT Re-Evaluation 11/24/21   ? Authorization Type UHC Medicaid (pt reports Medicare will activate May 1st)   ? Authorization Time Period $4 copay VL: 27 combined   ? OT Start Time 1230   ? OT Stop Time 1315   ? OT Time Calculation (min) 45 min   ? Activity Tolerance Patient tolerated treatment well   ? Behavior During Therapy [facility] for tasks assessed/performed   ? ?  ?  ? ?  ? ? ?Past Medical History:  ?Diagnosis Date  ? Hypertension   ? Pancreatitis   ? Stage 3a chronic kidney disease ([place])   ? Stroke [facility])   ? ?Past Surgical History:  ?Procedure Laterality Date  ? TUBAL LIGATION    ? ?Patient Active Problem List  ? Diagnosis Date Noted  ? Acute encephalopathy 04/09/2021  ? Hypertensive urgency 04/09/2021  ? Hemiparesis affecting left side as late effect of cerebrovascular accident (CVA) ([place]) 04/07/2021  ? Muscle spasticity 04/07/2021  ? History of stroke 03/23/2021  ? Chronic left shoulder pain 03/23/2021  ? Depression 03/23/2021  ? Glaucoma of both eyes 03/23/2021  ? Dizziness 03/13/2021  ? Orthostatic hypotension 03/13/2021  ? Weakness 03/05/2021  ? COVID-19 07/14/2020  ? Stroke [facility])   ? Stage 3a chronic kidney disease ([place])   ? Hypertension   ? ? ?ONSET DATE:  Patient had a stroke in Nov 2020, referral 09/08/2021   ? ?REFERRING DIAG: R29.898 (ICD-10-CM) - LUE weakness   ? ?THERAPY DIAG:  ?Hemiplegia and hemiparesis following other cerebrovascular disease affecting left non-dominant side ([place]) ? ?Other lack of coordination ? ?Muscle weakness (generalized) ? ?Unsteadiness on feet ? ?Other disturbances of skin sensation ? ? ?PERTINENT HISTORY: HTN, Pancreatisis, CVA, Stage III CKD   ? ?PRECAUTIONS: fall ? ?SUBJECTIVE:  "I try picking stuff up, but it just shakes." ? ?PAIN:  ?Are you having pain? No ? ? ? ?OBJECTIVE:  ? ?TODAY'S TREATMENT: ? ?09/20/21:  Reviewed OT goals for patient as this was second visit.  Patient expressing tension in left shoulder.  All movement lead by strong internal rotation component.  Worked on reducing tension in left shoulder/neck/trunk with trunk movement - and body on arm movement - extended arm weight bearing with weight shift toward right.  Patient educated to tip left ear toward left shoulder to reduce neck/upper shoulder tension.   ?Patient can benefit from treatment addressing muscle balance in shoulder and elbow thus leading to improved fine motor coordination.   ?  ?  ? ?  ?  ?PATIENT EDUCATION: ?Education details: OT goals, and tip ear toward shoulder to reduce shoulder tension ?Person educated: Patient ?Education method: Explanation and demonstration ?Education comprehension: verbalized understanding ?  ?  ?HOME EXERCISE PROGRAM: ?To Be Established ?  ?  ?  ?GOALS: ?Goals reviewed with patient? No ?  ?SHORT TERM GOALS: Target date: 10/11/2021 ?  ?Pt will be independent with HEP. ?Baseline: not issued at evaluation ?Goal status: Ongoing ?  ?2.  Pt will increase box and blocks score by 4 blocks or greater with LUE. ?Baseline: Right 56 blocks, Left 15 blocks ?Goal status: Ongoing ?  ?  3.  Pt will improve grip strength by 5 lbs or greater with LUE. ?Baseline: Right: 70.3 lbs; Left: 34.1 lbs ?Goal status: Ongoing ?  ?4.  Pt will verbalize understanding of adapted strategies and/or equipment PRN to increase safety and independence with ADLs and IADLs (I.e. chopping and peeling for meal prep) ?Baseline: difficulty with chopping vegetables and peeling ?Goal status: Ongoing ?  ?  ?LONG TERM GOALS: Target date: 11/24/21 ?  ?Pt will be independent with updated HEP. ?Baseline: not issued at evaluation ?Goal status: Ongoing ?  ?2.  Pt will increase fine motor coordination  in LUE by improving 9 hole peg test by achieving 5 pegs in 60 seconds or less. ?Baseline: Right: 23.82 sec; Left: 0 pegs in 60 sec ?Goal status: Ongoing ?  ?3.  Pt will increase box and blocks score by 10 blocks or greater with LUE. ?Baseline: Right 56 blocks, Left 15 blocks ?Goal status: Ongoing ?  ?4.  Pt will improve grip strength by 10 lbs or greater with LUE ?Baseline: Right: 70.3 lbs; Left: 34.1 lbs ?Goal status: Ongoing ? ?5.  Pt will perform all basic ADLs with mod I ?Baseline: continues to need some help with grooming and fasteners ?Goal status: Ongoing ?  ?ASSESSMENT: ?  ?CLINICAL IMPRESSION: ?Patient is eager for improved LUE functioning.  Patient somewhat limited by muscle imbalance and stiffness and may benefit from medical intervention to help reduce muscle tension in left shoulder.   ?  ?PERFORMANCE DEFICITS in functional skills including ADLs, IADLs, coordination, sensation, tone, ROM, strength, FMC, GMC, mobility, decreased knowledge of use of DME, vision, and UE functional use. ?  ?IMPAIRMENTS are limiting patient from ADLs, IADLs, and leisure.  ?  ?COMORBIDITIES has no other co-morbidities that affects occupational performance. Patient will benefit from skilled OT to address above impairments and improve overall function. ? ?  ?  ?PLAN: ?OT FREQUENCY: 2x/week ?  ?OT DURATION: 10 weeks 16 visits over 10 weeks d/t any scheduling conflicts ?  ?PLANNED INTERVENTIONS: self care/ADL training, therapeutic exercise, therapeutic activity, neuromuscular re-education, manual therapy, passive range of motion, functional mobility training, aquatic therapy, splinting, electrical stimulation, ultrasound, fluidotherapy, moist heat, cryotherapy, patient/family education, visual/perceptual remediation/compensation, and DME and/or AE instructions ?  ?RECOMMENDED OTHER SERVICES: patient is currently receiving PT at this clinic ?  ?CONSULTED AND AGREED WITH PLAN OF CARE: Patient ?  ?PLAN FOR NEXT SESSION: coordination  HEP (more functional, bigger, tasks), tying shoes and strategies for coordination for ADLs/IADLs. ? ? ? ? ?Mariah Milling, OT ?09/20/2021, 1:32 PM ? ?  ? ?  ?

## 2021-09-21 ENCOUNTER — Other Ambulatory Visit: Payer: Self-pay | Admitting: Physician Assistant

## 2021-09-22 ENCOUNTER — Ambulatory Visit: Payer: Medicaid Other | Admitting: Occupational Therapy

## 2021-09-22 ENCOUNTER — Encounter: Payer: Self-pay | Admitting: Occupational Therapy

## 2021-09-22 DIAGNOSIS — R278 Other lack of coordination: Secondary | ICD-10-CM

## 2021-09-22 DIAGNOSIS — M6281 Muscle weakness (generalized): Secondary | ICD-10-CM

## 2021-09-22 DIAGNOSIS — R2689 Other abnormalities of gait and mobility: Secondary | ICD-10-CM | POA: Diagnosis not present

## 2021-09-22 DIAGNOSIS — R2681 Unsteadiness on feet: Secondary | ICD-10-CM

## 2021-09-22 DIAGNOSIS — I69854 Hemiplegia and hemiparesis following other cerebrovascular disease affecting left non-dominant side: Secondary | ICD-10-CM

## 2021-09-22 DIAGNOSIS — R208 Other disturbances of skin sensation: Secondary | ICD-10-CM

## 2021-09-22 NOTE — Therapy (Signed)
?OUTPATIENT OCCUPATIONAL THERAPY TREATMENT NOTE ? ? ?Patient Name: Shirley Hansen ?MRN: [id number] ?DOB:Oct 27, 1965, 56 y.o., female ?Today's Date: 09/22/2021 ? ?PCP: Gildardo Pounds, NP ?REFERRING PROVIDER: Gildardo Pounds, NP ? ?END OF SESSION:  ? OT End of Session - 09/22/21 1318   ? ? Visit Number 3   ? Number of Visits 17   ? Date for OT Re-Evaluation 11/24/21   ? Authorization Type UHC Medicaid (pt reports Medicare will activate May 1st)   ? Authorization Time Period $4 copay VL: 27 combined   ? OT Start Time 1317   ? OT Stop Time 1400   ? OT Time Calculation (min) 43 min   ? Activity Tolerance Patient tolerated treatment well   ? Behavior During Therapy [facility] for tasks assessed/performed   ? ?  ?  ? ?  ? ? ?Past Medical History:  ?Diagnosis Date  ? Hypertension   ? Pancreatitis   ? Stage 3a chronic kidney disease ([place])   ? Stroke [facility])   ? ?Past Surgical History:  ?Procedure Laterality Date  ? TUBAL LIGATION    ? ?Patient Active Problem List  ? Diagnosis Date Noted  ? Acute encephalopathy 04/09/2021  ? Hypertensive urgency 04/09/2021  ? Hemiparesis affecting left side as late effect of cerebrovascular accident (CVA) ([place]) 04/07/2021  ? Muscle spasticity 04/07/2021  ? History of stroke 03/23/2021  ? Chronic left shoulder pain 03/23/2021  ? Depression 03/23/2021  ? Glaucoma of both eyes 03/23/2021  ? Dizziness 03/13/2021  ? Orthostatic hypotension 03/13/2021  ? Weakness 03/05/2021  ? COVID-19 07/14/2020  ? Stroke [facility])   ? Stage 3a chronic kidney disease ([place])   ? Hypertension   ? ? ?ONSET DATE:  Patient had a stroke in Nov 2020, referral 09/08/2021   ? ?REFERRING DIAG: R29.898 (ICD-10-CM) - LUE weakness   ? 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 ?Other lack of coordination ? ?Unsteadiness on feet ? ?Muscle weakness (generalized) ? ?Other disturbances of skin sensation ? ?Other abnormalities of gait and mobility ? ? ?PERTINENT  HISTORY: HTN, Pancreatisis, CVA, Stage III CKD  ? ?PRECAUTIONS: fall ? ?SUBJECTIVE:  "this arm is starting to hurt me" ? ?PAIN:  ?Are you having pain? Yes: NPRS scale: 9/10 ?Pain location: LUE ?Pain description: tightness ?Aggravating factors: moving it ?Relieving factors: rest ? ? ? ?OBJECTIVE:  ? ?TODAY'S TREATMENT: ? ?09/22/21 ?Pt reports increased pain and tightness in the LUE today with movement and stretching, etc.  ?Supine Shoulder Exercises with foam roller: shoulder flexion, chest press, horizontal abduction ?  Supine shoulder/scap retraction while on mat x 10 reps with min tactile cues. ?Pt with much increased pain today and tearful d/t pain. Sounds like pain d/t increased tension and tightness in LUE shoulder, pecs and scapula.  ?   ? ? ? ?09/20/21:  Reviewed OT goals for patient as this was second visit.  Patient expressing tension in left shoulder.  All movement lead by strong internal rotation component.  Worked on reducing tension in left shoulder/neck/trunk with trunk movement - and body on arm movement - extended arm weight bearing with weight shift toward right.  Patient educated to tip left ear toward left shoulder to reduce neck/upper shoulder tension.   ?Patient can benefit from treatment addressing muscle balance in shoulder and elbow thus leading to improved fine motor coordination.   ?  ?  ? ?  ?  ?PATIENT EDUCATION: ?Education details: Supine Exercises ?Person  educated: Patient ?Education method: Explanation and demonstration ?Education comprehension: verbalized understanding ?  ?  ?HOME EXERCISE PROGRAM: ?To Be Established ?  ?  ?  ?GOALS: ?Goals reviewed with patient? No ?  ?SHORT TERM GOALS: Target date: 10/11/2021 ?  ?Pt will be independent with HEP. ?Baseline: not issued at evaluation ?Goal status: Ongoing ?  ?2.  Pt will increase box and blocks score by 4 blocks or greater with LUE. ?Baseline: Right 56 blocks, Left 15 blocks ?Goal status: Ongoing ?  ?3.  Pt will improve grip strength by 5  lbs or greater with LUE. ?Baseline: Right: 70.3 lbs; Left: 34.1 lbs ?Goal status: Ongoing ?  ?4.  Pt will verbalize understanding of adapted strategies and/or equipment PRN to increase safety and independence with ADLs and IADLs (I.e. chopping and peeling for meal prep) ?Baseline: difficulty with chopping vegetables and peeling ?Goal status: Ongoing ?  ?  ?LONG TERM GOALS: Target date: 11/24/21 ?  ?Pt will be independent with updated HEP. ?Baseline: not issued at evaluation ?Goal status: Ongoing ?  ?2.  Pt will increase fine motor coordination in LUE by improving 9 hole peg test by achieving 5 pegs in 60 seconds or less. ?Baseline: Right: 23.82 sec; Left: 0 pegs in 60 sec ?Goal status: Ongoing ?  ?3.  Pt will increase box and blocks score by 10 blocks or greater with LUE. ?Baseline: Right 56 blocks, Left 15 blocks ?Goal status: Ongoing ?  ?4.  Pt will improve grip strength by 10 lbs or greater with LUE ?Baseline: Right: 70.3 lbs; Left: 34.1 lbs ?Goal status: Ongoing ? ?5.  Pt will perform all basic ADLs with mod I ?Baseline: continues to need some help with grooming and fasteners ?Goal status: Ongoing ?  ?ASSESSMENT: ?  ?CLINICAL IMPRESSION: ?Pt reporting increased pain in LUE shoulder today - impeding overall ability to participate in some exercises.  ?  ?PERFORMANCE DEFICITS in functional skills including ADLs, IADLs, coordination, sensation, tone, ROM, strength, FMC, GMC, mobility, decreased knowledge of use of DME, vision, and UE functional use. ?  ?IMPAIRMENTS are limiting patient from ADLs, IADLs, and leisure.  ?  ?COMORBIDITIES has no other co-morbidities that affects occupational performance. Patient will benefit from skilled OT to address above impairments and improve overall function. ? ?  ?  ?PLAN: ?OT FREQUENCY: 2x/week ?  ?OT DURATION: 10 weeks 16 visits over 10 weeks d/t any scheduling conflicts ?  ?PLANNED INTERVENTIONS: self care/ADL training, therapeutic exercise, therapeutic activity, neuromuscular  re-education, manual therapy, passive range of motion, functional mobility training, aquatic therapy, splinting, electrical stimulation, ultrasound, fluidotherapy, moist heat, cryotherapy, patient/family education, visual/perceptual remediation/compensation, and DME and/or AE instructions ?  ?RECOMMENDED OTHER SERVICES: patient is currently receiving PT at this clinic ?  ?CONSULTED AND AGREED WITH PLAN OF CARE: Patient ?  ?PLAN FOR NEXT SESSION:  coordination HEP (more functional, bigger, tasks), tying shoes and strategies for coordination for ADLs/IADLs. ? ? ? ? ?Zachery Conch, OT ?09/22/2021, 2:02 PM ? ?  ? ?  ?

## 2021-09-26 ENCOUNTER — Ambulatory Visit: Payer: Medicaid Other | Admitting: Physical Therapy

## 2021-09-26 ENCOUNTER — Ambulatory Visit: Payer: Medicaid Other | Admitting: Occupational Therapy

## 2021-09-26 ENCOUNTER — Encounter: Payer: Self-pay | Admitting: Physical Therapy

## 2021-09-26 ENCOUNTER — Encounter: Payer: Self-pay | Admitting: Occupational Therapy

## 2021-09-26 VITALS — BP 128/91 | HR 93

## 2021-09-26 DIAGNOSIS — R2689 Other abnormalities of gait and mobility: Secondary | ICD-10-CM | POA: Diagnosis not present

## 2021-09-26 DIAGNOSIS — I69854 Hemiplegia and hemiparesis following other cerebrovascular disease affecting left non-dominant side: Secondary | ICD-10-CM

## 2021-09-26 DIAGNOSIS — M6281 Muscle weakness (generalized): Secondary | ICD-10-CM

## 2021-09-26 DIAGNOSIS — R2681 Unsteadiness on feet: Secondary | ICD-10-CM

## 2021-09-26 DIAGNOSIS — R278 Other lack of coordination: Secondary | ICD-10-CM

## 2021-09-26 DIAGNOSIS — R208 Other disturbances of skin sensation: Secondary | ICD-10-CM

## 2021-09-26 NOTE — Patient Instructions (Signed)
Warning Signs of a Stroke A stroke is a medical emergency and should be treated right away--every second counts. A stroke is caused by a decrease or block in blood flow to the brain. When certain areas of the brain do not get enough oxygen, brain cells begin to die. A stroke can lead to brain damage and can sometimes be life-threatening. However, if a person gets medical treatment right away, he or she has a better chance of surviving and recovering from a stroke. It is very important to be able to recognize the symptoms of a stroke. Types of strokes There are two main types of strokes: Ischemic stroke. This is the most common type. This stroke happens when a blood vessel that supplies blood to the brain is blocked. Hemorrhagic stroke. This results from bleeding in the brain when a blood vessel leaks or bursts (ruptures). A transient ischemic attack (TIA) causes stroke-like symptoms that go away quickly. Unlike a stroke, a TIA does not cause permanent damage to the brain. However, the symptoms of a TIA are the same as a stroke. TIAs also require medical treatment right away. Having a TIA is a sign that you are at higher risk for a stroke. Warning signs of a stroke The symptoms of stroke may vary and will reflect the part of the brain that is involved. Symptoms usually happen suddenly. "BE FAST" is an easy way to remember the main warning signs of a stroke: B - Balance. Signs are dizziness, sudden trouble walking, or loss of balance. E - Eyes. Signs are trouble seeing or a sudden change in vision. F - Face. Signs are sudden weakness or numbness of the face, or the face or eyelid drooping on one side. A - Arms. Signs are weakness or numbness in an arm. This happens suddenly and usually on one side of the body. S - Speech. Signs are sudden trouble speaking, slurred speech, or trouble understanding what people say. T - Time. Time to call emergency services. Write down what time symptoms started. Other  signs of a stroke Some less common signs of a stroke include: A sudden, severe headache with no known cause. Nausea or vomiting. Seizure. A stroke may be happening even if only one "BE FAST" symptom is present. These symptoms may represent a serious problem that is an emergency. Do not wait to see if the symptoms will go away. Get medical help right away. Call your local emergency services (911 in the [place] Do not drive yourself to the hospital. Summary A stroke is a medical emergency and should be treated right away--every second counts. "BE FAST" is an easy way to remember the main warning signs of a stroke. Call your local emergency services right away if you or someone else has any stroke symptoms, even if the symptoms go away. Make note of what time the first symptoms appeared. Emergency responders or emergency room staff will need to know this information. Do not wait to see if symptoms will go away. Call 911 even if only one of the "BE FAST" symptoms appears. This information is not intended to replace advice given to you by your health care provider. Make sure you discuss any questions you have with your health care provider. Document Revised: 12/29/2019 Document Reviewed: 12/29/2019 Elsevier Patient Education  2023 Elsevier Inc[employer]  

## 2021-09-26 NOTE — Therapy (Signed)
?OUTPATIENT PHYSICAL THERAPY TREATMENT NOTE ? ? ?Patient Name: Shirley Hansen ?MRN: [id number] ?DOB:[birth year], 56 y.o., female ?Today's Date: 09/26/2021 ? ?PCP: Gildardo Pounds, NP ?REFERRING PROVIDER: Gildardo Pounds, NP ? ? PT End of Session - 09/26/21 1540   ? ? Visit Number 4   ? Number of Visits 17   ? Date for PT Re-Evaluation 11/11/21   pushed out due to scheduling  ? Authorization Type UHC Medicaid; VL: 27   ? PT Start Time 1536   ? PT Stop Time 1615   ? PT Time Calculation (min) 39 min   ? Equipment Utilized During Treatment Gait belt   ? Activity Tolerance Patient tolerated treatment well   ? Behavior During Therapy [facility] for tasks assessed/performed   ? ?  ?  ? ?  ? ? ?Past Medical History:  ?Diagnosis Date  ? Hypertension   ? Pancreatitis   ? Stage 3a chronic kidney disease ([place])   ? Stroke [facility])   ? ?Past Surgical History:  ?Procedure Laterality Date  ? TUBAL LIGATION    ? ?Patient Active Problem List  ? Diagnosis Date Noted  ? Acute encephalopathy 04/09/2021  ? Hypertensive urgency 04/09/2021  ? Hemiparesis affecting left side as late effect of cerebrovascular accident (CVA) ([place]) 04/07/2021  ? Muscle spasticity 04/07/2021  ? History of stroke 03/23/2021  ? Chronic left shoulder pain 03/23/2021  ? Depression 03/23/2021  ? Glaucoma of both eyes 03/23/2021  ? Dizziness 03/13/2021  ? Orthostatic hypotension 03/13/2021  ? Weakness 03/05/2021  ? COVID-19 07/14/2020  ? Stroke [facility])   ? Stage 3a chronic kidney disease ([place])   ? Hypertension   ? ? ?REFERRING DIAG: I69.354 (ICD-10-CM) - Hemiparesis affecting left side as late effect of cerebrovascular accident (CVA) ([place]) ? ?THERAPY DIAG:  ?Hemiplegia and hemiparesis following other cerebrovascular disease affecting left non-dominant side ([place]) ? ?Other lack of coordination ? ?Unsteadiness on feet ? ?PERTINENT HISTORY: HTN, Pancreatisis, CVA, Stage III CKD ? ?PRECAUTIONS: Fall ? ?SUBJECTIVE: Pt did not sleep well last night.  She is hurting on her left side today  and states it feels nerve related.  ? ?PAIN:  ?Are you having pain? Yes: NPRS scale: 8/10 ?Pain location: midline low back, L shoulder blade to left under arm ?Pain description: burning, tingling ?Aggravating factors: being cold ?Relieving factors: heat ? TODAY'S TREATMENT:   ?Pt transfers w/c<>mat table using CGA performing stand step. ?Gait training in gym on level surface w/ RW 121' + 119'; therapist pulls w/c behind pt due to inc discomfort on left side today; cued for inc RLE step through to inc L stance time.  Pt maintains correction well.  Report inc LLE burning at end of gait. ?STS w/ RLE elevated on 2" step w/ mirror feedback using BUE to push into standing x12; therapist facilitates weight shift at waist during sitting, pt uses RUE to push up more than LUE.  Pt requires rest every 2 reps ?Pt stands EOM in front of mirror holding balance w/ RLE elevated on 2" step x50min>trunk rotations w/ reach alternating sides into progressively inc ROM x12 each direction> standing w/ RLE elevated on 2" step performing forward reaching w/ BUE to touch mirror cued to watch RUE ?Pt stands EOM using BUE support on RW in front of mirror performing alt toe taps to 4" step x20>RUE support x20 ? ? ? ? ? ?PATIENT EDUCATION: ?Education details: Edu on contacting MD about options for medical pain management. ?Person educated: Patient ?Education method: Explanation ?  Education comprehension: verbalized understanding ?  ?  ?HOME EXERCISE PROGRAM: ?Access Code: [email or web address] ?URL: [email or web address] ?Date: 09/12/2021 ?Prepared by: Elease Etienne ? ?Exercises ?- Seated Long Arc Quad  - 1 x daily - 4 x weekly - 2 sets - 12 reps - 2 seconds hold ?- Sit to Stand with Armchair  - 1 x daily - 4 x weekly - 2 sets - 6 reps ?- Standing March with Counter Support  - 1 x daily - 4 x weekly - 2 sets - 20 reps  ? - Supine Bridge  - 1 x daily - 5 x weekly - 2 sets - 12 reps ?- Lower Trunk Rotations  - 1 x daily - 4 x weekly - 2 sets -  10 reps ?- Supine March  - 1 x daily - 3 x weekly - 2 sets - 10 reps ? - Seated Hamstring Curls with Resistance  - 1 x daily - 4 x weekly - 1 sets - 12 reps ?- Standing Single Leg Stance with Counter Support  - 1 x daily - 7 x weekly - 2 sets - 2 reps - 30 seconds hold ? ?GOALS: ?Goals reviewed with patient? Yes ?  ?SHORT TERM GOALS: Target date: 10/07/2021 ?  ?Pt will be independent with initial HEP for improved strengthening and balance  ?Baseline: No HEP established ?Goal status: INITIAL ?  ?2.  Pt will improve gait speed to >/= 0.75 ft/sec to demonstrate improved community ambulation ?Baseline: 0.46 ft/sec ?Goal status: INITIAL ?  ?3.  Pt will improve 5x STS to </= 45 sec to demo improved functional LE strength and balance  ?Baseline: 51.81 ?Goal status: INITIAL ?  ?4.  Pt will increase BERG balance score to 30/56 to demonstrate improved static balance. ?Baseline: 27/56 ?Goal status: INITIAL ?  ?5.  Pt will demonstrate TUG of <45 seconds in order to decrease risk of falls and improve functional mobility using LRAD. ?Baseline: 50.52sec ?Goal status: INITIAL ?  ?6.  Pt will be able to ambulate >/= 300 ft with LRAD and CGA to demo improved household mobility ?Baseline: 107 ft with RW and CGA ?Goal status: INITIAL ?  ?LONG TERM GOALS: Target date: 11/04/2021 ?  ?Pt will be independent with final progressive HEP for improved strengthening and balance  ?Baseline: No HEP established ?Goal status: INITIAL ?  ?2.  Pt will improve gait speed to >/= 1.0 ft/sec to demonstrate improved community ambulation ?Baseline: 0.46 ft/sec ?Goal status: INITIAL ?  ?3.  Pt will improve 5x STS to </= 35 sec to demo improved functional LE strength and balance  ?Baseline: 51.81 secs ?Goal status: INITIAL ?  ?4.  Pt will increase BERG balance score to 33/56 to demonstrate improved static balance. ?Baseline: TBA ?Goal status: INITIAL ?  ?5.  Pt will demonstrate TUG of <40 seconds in order to decrease risk of falls and improve functional  mobility using LRAD. ?Baseline: 50.52sec ?Goal status: INITIAL ?  ?6.  Pt will be able to ambulate >/= 500 ft with LRAD and supervison to demo improved household mobility ?Baseline: 107 ft with CGA and RW ?Goal status: INITIAL ?  ?ASSESSMENT: ?  ?CLINICAL IMPRESSION: ?Session focused on gait training w/ RW, functional strengthening of LLE and static balance using RLE elevated on 2" step.  Pt continues to be limited by decreased activity tolerance.  Continue per POC. ?  ?  ?  ?OBJECTIVE IMPAIRMENTS Abnormal gait, decreased activity tolerance, decreased balance, decreased coordination, decreased endurance, decreased knowledge of use of  DME, decreased mobility, difficulty walking, decreased strength, decreased safety awareness, increased edema, impaired sensation, impaired tone, postural dysfunction, and pain.  ?  ?ACTIVITY LIMITATIONS cleaning, meal prep, laundry, and medication management.  ?  ?PERSONAL FACTORS Time since onset of injury/illness/exacerbation and 3+ comorbidities: HTN, Pancreatisis, CVA, Stage III CKD  are also affecting patient's functional outcome.  ?  ?  ?REHAB POTENTIAL: Good ?  ?CLINICAL DECISION MAKING: Evolving/moderate complexity ?  ?EVALUATION COMPLEXITY: Moderate ?  ?PLAN: ?PT FREQUENCY: 2x/week ?  ?PT DURATION: 8 weeks (may need to decreased duration based upon OT and Pelvic Floor Needs ?  ?PLANNED INTERVENTIONS: Therapeutic exercises, Therapeutic activity, Neuromuscular re-education, Balance training, Gait training, Patient/Family education, Joint manipulation, Joint mobilization, Stair training, Vestibular training, Orthotic/Fit training, DME instructions, Aquatic Therapy, Cryotherapy, and Moist heat ?  ?PLAN FOR NEXT SESSION: SLS-inc L stance time. Work on even weight distribution. Update HEP for Strength LLE > RLE prn.  Gait training w/ RW-esp turning, trial NuStep/SciFit for endurance and reciprocal mobility as needed.  Core engagement. ? ? ? ? ? ?Elease Etienne, PT, DPT ?09/26/2021,  4:33 PM ? ?  ? ?

## 2021-09-26 NOTE — Therapy (Signed)
?OUTPATIENT OCCUPATIONAL THERAPY TREATMENT NOTE ? ? ?Patient Name: Shirley Hansen ?MRN: [id number] ?DOB:Apr 14, 1966, 56 y.o., female ?Today's Date: 09/26/2021 ? ?PCP: Gildardo Pounds, NP ?REFERRING PROVIDER: Gildardo Pounds, NP ? ?END OF SESSION:  ? OT End of Session - 09/26/21 1459   ? ? Visit Number 4   ? Number of Visits 17   ? Date for OT Re-Evaluation 11/24/21   ? Authorization Type UHC Medicaid (pt reports Medicare will activate May 1st)   ? Authorization Time Period $4 copay VL: 27 combined   ? OT Start Time [id number]   ? OT Stop Time 1530   ? OT Time Calculation (min) 41 min   ? Activity Tolerance Patient tolerated treatment well   ? Behavior During Therapy [facility] for tasks assessed/performed   ? ?  ?  ? ?  ? ? ? ?Past Medical History:  ?Diagnosis Date  ? Hypertension   ? Pancreatitis   ? Stage 3a chronic kidney disease ([place])   ? Stroke [facility])   ? ?Past Surgical History:  ?Procedure Laterality Date  ? TUBAL LIGATION    ? ?Patient Active Problem List  ? Diagnosis Date Noted  ? Acute encephalopathy 04/09/2021  ? Hypertensive urgency 04/09/2021  ? Hemiparesis affecting left side as late effect of cerebrovascular accident (CVA) ([place]) 04/07/2021  ? Muscle spasticity 04/07/2021  ? History of stroke 03/23/2021  ? Chronic left shoulder pain 03/23/2021  ? Depression 03/23/2021  ? Glaucoma of both eyes 03/23/2021  ? Dizziness 03/13/2021  ? Orthostatic hypotension 03/13/2021  ? Weakness 03/05/2021  ? COVID-19 07/14/2020  ? Stroke [facility])   ? Stage 3a chronic kidney disease ([place])   ? Hypertension   ? ? ?ONSET DATE:  Patient had a stroke in Nov 2020, referral 09/08/2021   ? ?REFERRING DIAG: R29.898 (ICD-10-CM) - LUE weakness   ? ?THERAPY DIAG:  ?Hemiplegia and hemiparesis following other cerebrovascular disease affecting left non-dominant side ([place]) ? ?Other lack of coordination ? ?Unsteadiness on feet ? ?Muscle weakness (generalized) ? ?Other disturbances of skin sensation ? ? ?PERTINENT HISTORY: HTN, Pancreatisis, CVA, Stage III CKD   ? ?PRECAUTIONS: fall ? ?SUBJECTIVE:  "this arm is starting to hurt me" ? ?PAIN:  ?Are you having pain? Yes: NPRS scale: 8/10 ?Pain location: LUE neck and distal ?Pain description: tightness ?Aggravating factors: moving it ?Relieving factors: rest ? ? ? ?OBJECTIVE:  ? ?TODAY'S TREATMENT: ? ?09/26/21 ?Pt reports increased pain in LUE and vision changes (blurriness) with left eye. Vitals assessed and educated patient on stroke signs for monitoring anymore symptoms and evaluating need for going to ED. Pt verbalized understanding. ? ?Table Slides  with mod tactile and verbal cues for decreasing elevation of LUE shoulder where tightness and overactivation increasing pain. Forward reaching and with circumduction. Pt reported decreased pain after exercise  ? ?Deep Breathing Exercises for pain management x 5 reps  ? ?Flipping Cards regular size playing cards with LUE with mod difficulty. Encouraged patient to do at home. ?  ?PATIENT EDUCATION: ?Education details: Stroke Signs and Symptoms ?Person educated: Patient ?Education method: Explanation ?Education comprehension: verbalized understanding ?  ?  ?HOME EXERCISE PROGRAM: ?09/26/21 Table Slides, flipping cards ?  ?  ?  ?GOALS: ?Goals reviewed with patient? No ?  ?SHORT TERM GOALS: Target date: 10/11/2021 ?  ?Pt will be independent with HEP. ?Baseline: not issued at evaluation ?Goal status: Ongoing ?  ?2.  Pt will increase box and blocks score by 4 blocks or greater with LUE. ?Baseline: Right  56 blocks, Left 15 blocks ?Goal status: Ongoing ?  ?3.  Pt will improve grip strength by 5 lbs or greater with LUE. ?Baseline: Right: 70.3 lbs; Left: 34.1 lbs ?Goal status: Ongoing ?  ?4.  Pt will verbalize understanding of adapted strategies and/or equipment PRN to increase safety and independence with ADLs and IADLs (I.e. chopping and peeling for meal prep) ?Baseline: difficulty with chopping vegetables and peeling ?Goal status: Ongoing ?  ?  ?LONG TERM GOALS: Target date: 11/24/21 ?   ?Pt will be independent with updated HEP. ?Baseline: not issued at evaluation ?Goal status: Ongoing ?  ?2.  Pt will increase fine motor coordination in LUE by improving 9 hole peg test by achieving 5 pegs in 60 seconds or less. ?Baseline: Right: 23.82 sec; Left: 0 pegs in 60 sec ?Goal status: Ongoing ?  ?3.  Pt will increase box and blocks score by 10 blocks or greater with LUE. ?Baseline: Right 56 blocks, Left 15 blocks ?Goal status: Ongoing ?  ?4.  Pt will improve grip strength by 10 lbs or greater with LUE ?Baseline: Right: 70.3 lbs; Left: 34.1 lbs ?Goal status: Ongoing ? ?5.  Pt will perform all basic ADLs with mod I ?Baseline: continues to need some help with grooming and fasteners ?Goal status: Ongoing ?  ?ASSESSMENT: ?  ?CLINICAL IMPRESSION: ?Pt continues to have increased pain in LUE shoulder. Encouraged to do pain management deep breathing techniques and relaxation tasks for decreasing guarding and overactivation in LUE shoulder.  ?  ?PERFORMANCE DEFICITS in functional skills including ADLs, IADLs, coordination, sensation, tone, ROM, strength, FMC, GMC, mobility, decreased knowledge of use of DME, vision, and UE functional use. ?  ?IMPAIRMENTS are limiting patient from ADLs, IADLs, and leisure.  ?  ?COMORBIDITIES has no other co-morbidities that affects occupational performance. Patient will benefit from skilled OT to address above impairments and improve overall function. ? ?  ?  ?PLAN: ?OT FREQUENCY: 2x/week ?  ?OT DURATION: 10 weeks 16 visits over 10 weeks d/t any scheduling conflicts ?  ?PLANNED INTERVENTIONS: self care/ADL training, therapeutic exercise, therapeutic activity, neuromuscular re-education, manual therapy, passive range of motion, functional mobility training, aquatic therapy, splinting, electrical stimulation, ultrasound, fluidotherapy, moist heat, cryotherapy, patient/family education, visual/perceptual remediation/compensation, and DME and/or AE instructions ?  ?RECOMMENDED OTHER  SERVICES: patient is currently receiving PT at this clinic ?  ?CONSULTED AND AGREED WITH PLAN OF CARE: Patient ?  ?PLAN FOR NEXT SESSION:  coordination HEP (more functional, bigger, tasks), tying shoes and strategies for coordination for ADLs/IADLs. ? ? ? ? ?Zachery Conch, OT ?09/26/2021, 3:31 PM ? ?  ? ?  ?

## 2021-09-28 ENCOUNTER — Ambulatory Visit: Payer: Medicaid Other | Admitting: Occupational Therapy

## 2021-09-28 ENCOUNTER — Encounter: Payer: Self-pay | Admitting: Physical Therapy

## 2021-09-28 ENCOUNTER — Ambulatory Visit: Payer: Medicaid Other | Admitting: Physical Therapy

## 2021-09-28 DIAGNOSIS — I69854 Hemiplegia and hemiparesis following other cerebrovascular disease affecting left non-dominant side: Secondary | ICD-10-CM

## 2021-09-28 DIAGNOSIS — R2681 Unsteadiness on feet: Secondary | ICD-10-CM

## 2021-09-28 DIAGNOSIS — R2689 Other abnormalities of gait and mobility: Secondary | ICD-10-CM | POA: Diagnosis not present

## 2021-09-28 DIAGNOSIS — M6281 Muscle weakness (generalized): Secondary | ICD-10-CM

## 2021-09-28 DIAGNOSIS — R262 Difficulty in walking, not elsewhere classified: Secondary | ICD-10-CM

## 2021-09-28 NOTE — Therapy (Signed)
?OUTPATIENT PHYSICAL THERAPY TREATMENT NOTE ? ? ?Patient Name: Shirley Hansen ?MRN: [id number] ?DOB:03-16-1966, 56 y.o., female ?Today's Date: 09/29/2021 ? ?PCP: Gildardo Pounds, NP ?REFERRING PROVIDER: Gildardo Pounds, NP ? ? PT End of Session - 09/28/21 1405   ? ? Visit Number 5   ? Number of Visits 17   ? Date for PT Re-Evaluation 11/11/21   pushed out due to scheduling  ? Authorization Type UHC Medicaid; VL: 27   ? PT Start Time 1402   ? PT Stop Time [id number]   ? PT Time Calculation (min) 41 min   ? Equipment Utilized During Treatment Gait belt;Other (comment)   hand orthotic  ? Activity Tolerance Patient tolerated treatment well   ? Behavior During Therapy [facility] for tasks assessed/performed   ? ?  ?  ? ?  ? ? ?Past Medical History:  ?Diagnosis Date  ? Hypertension   ? Pancreatitis   ? Stage 3a chronic kidney disease ([place])   ? Stroke [facility])   ? ?Past Surgical History:  ?Procedure Laterality Date  ? TUBAL LIGATION    ? ?Patient Active Problem List  ? Diagnosis Date Noted  ? Acute encephalopathy 04/09/2021  ? Hypertensive urgency 04/09/2021  ? Hemiparesis affecting left side as late effect of cerebrovascular accident (CVA) ([place]) 04/07/2021  ? Muscle spasticity 04/07/2021  ? History of stroke 03/23/2021  ? Chronic left shoulder pain 03/23/2021  ? Depression 03/23/2021  ? Glaucoma of both eyes 03/23/2021  ? Dizziness 03/13/2021  ? Orthostatic hypotension 03/13/2021  ? Weakness 03/05/2021  ? COVID-19 07/14/2020  ? Stroke [facility])   ? Stage 3a chronic kidney disease ([place])   ? Hypertension   ? ? ?REFERRING DIAG: I69.354 (ICD-10-CM) - Hemiparesis affecting left side as late effect of cerebrovascular accident (CVA) ([employer]) ? ?THERAPY DIAG:  ?Unsteadiness on feet ? ?Muscle weakness (generalized) ? ?Other abnormalities of gait and mobility ? ?Difficulty in walking, not elsewhere classified ? ?PERTINENT HISTORY: HTN, Pancreatisis, CVA, Stage III CKD ? ?PRECAUTIONS: Fall ? 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 No  falls. Continues with left arm stiffness/soreness/tightness.  ? ?PAIN:  ?Are you having pain? Yes: NPRS scale: 7/10 ?Pain location: midline low back, L shoulder blade to left under arm ?Pain description: burning, tingling ?Aggravating factors: being cold ?Relieving factors: heat ?  ? ?TODAY'S TREATMENT:   ? ?09/28/2021 ?TRANSFERS:  ?Sit<>stand: supervision with UE assist multiple reps throughout session from/to mat table/wheelchair surfaces ?Stand pivot transfer: Min HHA from mat<>wheelchair with cues on sequencing, posture and step placement.  ? ?NMR: ?Standing with right UE support (HHA): alternating foot taps to 6 inch box with emphasis on left hip/knee flexion (no circumduction) and on quad/glut activation in left stance for stability. Min to mod assist for balance with cues for weight shifting and upright posture.  ? ?GAIT: ?Gait pattern: step through pattern, decreased step length- Right, decreased stance time- Left, decreased hip/knee flexion- Left, decreased ankle dorsiflexion- Left, Left foot flat, and narrow BOS ?Distance walked: 115 x2 reps ?Assistive device utilized: Environmental consultant - 2 wheeled and trail of left hand orthotic ?Level of assistance:  supervision to min guard assist ?Comments: noted to have decreased hip/knee flexion and decreased DF on left side with swing phase. Used theraband to left LE for increased flexion/DF with second lap of gait with mild improvement noted with swing phase. Continued cues for heel strike with left stepping.  Pt does feel the hand orthotic helps to keep her hand  from falling off the walker with gait as it did not fall off a single time with session. BP after both gait reps 134/98. HR 83. After 5 minute seated rest break: 133/100.  ? ?BP at end of session: 137/95, HR 84 ? ? ? ?PATIENT EDUCATION: ?Education details: continue with current HEP ?Person educated: Patient ?Education method: Explanation ?Education comprehension: verbalized understanding ?  ?  ?HOME EXERCISE  PROGRAM: ?Access Code: [email or web address] ?URL: [email or web address] ?Date: 09/12/2021 ?Prepared by: Elease Etienne ? ?Exercises ?- Seated Long Arc Quad  - 1 x daily - 4 x weekly - 2 sets - 12 reps - 2 seconds hold ?- Sit to Stand with Armchair  - 1 x daily - 4 x weekly - 2 sets - 6 reps ?- Standing March with Counter Support  - 1 x daily - 4 x weekly - 2 sets - 20 reps  ?  - Supine Bridge  - 1 x daily - 5 x weekly - 2 sets - 12 reps ?- Lower Trunk Rotations  - 1 x daily - 4 x weekly - 2 sets - 10 reps ?- Supine March  - 1 x daily - 3 x weekly - 2 sets - 10 reps ?  - Seated Hamstring Curls with Resistance  - 1 x daily - 4 x weekly - 1 sets - 12 reps ?- Standing Single Leg Stance with Counter Support  - 1 x daily - 7 x weekly - 2 sets - 2 reps - 30 seconds hold ? ?GOALS: ?Goals reviewed with patient? Yes ?  ?SHORT TERM GOALS: Target date: 10/07/2021 ?  ?Pt will be independent with initial HEP for improved strengthening and balance  ?Baseline: No HEP established ?Goal status: INITIAL ?  ?2.  Pt will improve gait speed to >/= 0.75 ft/sec to demonstrate improved community ambulation ?Baseline: 0.46 ft/sec ?Goal status: INITIAL ?  ?3.  Pt will improve 5x STS to </= 45 sec to demo improved functional LE strength and balance  ?Baseline: 51.81 ?Goal status: INITIAL ?  ?4.  Pt will increase BERG balance score to 30/56 to demonstrate improved static balance. ?Baseline: 27/56 ?Goal status: INITIAL ?  ?5.  Pt will demonstrate TUG of <45 seconds in order to decrease risk of falls and improve functional mobility using LRAD. ?Baseline: 50.52sec ?Goal status: INITIAL ?  ?6.  Pt will be able to ambulate >/= 300 ft with LRAD and CGA to demo improved household mobility ?Baseline: 107 ft with RW and CGA ?Goal status: INITIAL ?  ?LONG TERM GOALS: Target date: 11/04/2021 ?  ?Pt will be independent with final progressive HEP for improved strengthening and balance  ?Baseline: No HEP established ?Goal status: INITIAL ?  ?2.  Pt will  improve gait speed to >/= 1.0 ft/sec to demonstrate improved community ambulation ?Baseline: 0.46 ft/sec ?Goal status: INITIAL ?  ?3.  Pt will improve 5x STS to </= 35 sec to demo improved functional LE strength and balance  ?Baseline: 51.81 secs ?Goal status: INITIAL ?  ?4.  Pt will increase BERG balance score to 33/56 to demonstrate improved static balance. ?Baseline: TBA ?Goal status: INITIAL ?  ?5.  Pt will demonstrate TUG of <40 seconds in order to decrease risk of falls and improve functional mobility using LRAD. ?Baseline: 50.52sec ?Goal status: INITIAL ?  ?6.  Pt will be able to ambulate >/= 500 ft with LRAD and supervison to demo improved household mobility ?Baseline: 107 ft with CGA and RW ?Goal status: INITIAL ?  ?ASSESSMENT: ?  ?  CLINICAL IMPRESSION: ?Today's skilled session continued to address transfer training, gait training and LE strengthening/standing balance. Pt did report use of left hand orthotic on walker improved her ability to keep her hand on the walker as it tends to fall off. Will continue with use at next session and if still improving hand safety on walker issue one for use at home. Pt continues to need cues for hip/knee flexion with swing phase. Did slightly improve with use of theraband with gait, however minimal carryover noted. The pt is progressing toward goals and should benefit from continued PT to progress toward unmet goals.  ?  ?  ?  ?OBJECTIVE IMPAIRMENTS Abnormal gait, decreased activity tolerance, decreased balance, decreased coordination, decreased endurance, decreased knowledge of use of DME, decreased mobility, difficulty walking, decreased strength, decreased safety awareness, increased edema, impaired sensation, impaired tone, postural dysfunction, and pain.  ?  ?ACTIVITY LIMITATIONS cleaning, meal prep, laundry, and medication management.  ?  ?PERSONAL FACTORS Time since onset of injury/illness/exacerbation and 3+ comorbidities: HTN, Pancreatisis, CVA, Stage III CKD   are also affecting patient's functional outcome.  ?  ?  ?REHAB POTENTIAL: Good ?  ?CLINICAL DECISION MAKING: Evolving/moderate complexity ?  ?EVALUATION COMPLEXITY: Moderate ?  ?PLAN: ?PT FREQUENCY: 2x/week ?  ?

## 2021-10-03 ENCOUNTER — Ambulatory Visit: Payer: Medicaid Other

## 2021-10-03 VITALS — BP 113/78 | HR 77

## 2021-10-03 DIAGNOSIS — R2681 Unsteadiness on feet: Secondary | ICD-10-CM

## 2021-10-03 DIAGNOSIS — R262 Difficulty in walking, not elsewhere classified: Secondary | ICD-10-CM

## 2021-10-03 DIAGNOSIS — R2689 Other abnormalities of gait and mobility: Secondary | ICD-10-CM

## 2021-10-03 DIAGNOSIS — M6281 Muscle weakness (generalized): Secondary | ICD-10-CM

## 2021-10-03 NOTE — Therapy (Signed)
?OUTPATIENT PHYSICAL THERAPY TREATMENT NOTE ? ? ?Patient Name: Shirley Hansen ?MRN: [id number] ?DOB:12-23-1965, 56 y.o., female ?Today's Date: 10/03/2021 ? ?PCP: Gildardo Pounds, NP ?REFERRING PROVIDER: Gildardo Pounds, NP ? ? PT End of Session - 10/03/21 1446   ? ? Visit Number 6   ? Number of Visits 17   ? Date for PT Re-Evaluation 11/11/21   pushed out due to scheduling  ? Authorization Type UHC Medicaid; VL: 27   ? PT Start Time [id number]   ? PT Stop Time 1530   ? PT Time Calculation (min) 44 min   ? Equipment Utilized During Treatment Gait belt;Other (comment)   hand orthotic  ? Activity Tolerance Patient tolerated treatment well   ? Behavior During Therapy [facility] for tasks assessed/performed   ? ?  ?  ? ?  ? ? ?Past Medical History:  ?Diagnosis Date  ? Hypertension   ? Pancreatitis   ? Stage 3a chronic kidney disease ([place])   ? Stroke [facility])   ? ?Past Surgical History:  ?Procedure Laterality Date  ? TUBAL LIGATION    ? ?Patient Active Problem List  ? Diagnosis Date Noted  ? Acute encephalopathy 04/09/2021  ? Hypertensive urgency 04/09/2021  ? Hemiparesis affecting left side as late effect of cerebrovascular accident (CVA) ([place]) 04/07/2021  ? Muscle spasticity 04/07/2021  ? History of stroke 03/23/2021  ? Chronic left shoulder pain 03/23/2021  ? Depression 03/23/2021  ? Glaucoma of both eyes 03/23/2021  ? Dizziness 03/13/2021  ? Orthostatic hypotension 03/13/2021  ? Weakness 03/05/2021  ? COVID-19 07/14/2020  ? Stroke [facility])   ? Stage 3a chronic kidney disease ([place])   ? Hypertension   ? ? ?REFERRING DIAG: I69.354 (ICD-10-CM) - Hemiparesis affecting left side as late effect of cerebrovascular accident (CVA) ([place]) ? ?THERAPY DIAG:  ?Unsteadiness on feet ? ?Muscle weakness (generalized) ? ?Other abnormalities of gait and mobility ? ?Difficulty in walking, not elsewhere classified ? ?PERTINENT HISTORY: HTN, Pancreatisis, CVA, Stage III CKD ? ?PRECAUTIONS: Fall ? ?SUBJECTIVE:  Patient reports that she has been having some  irritation of the L eye. Reports it has been blurry and irritated. Feels as her vision is off. Reports she has contacted MD office but waiting on return phone call to schedule appointment. No falls.  ? ?PAIN:  ?Are you having pain? Yes: NPRS scale: 7/10 ?Pain location: L shoulder blade to left under arm ?Pain description: burning, stiffness ?Aggravating factors: being cold ?Relieving factors: heat ?  ?Today's Vitals  ? 10/03/21 1451 10/03/21 1455  ?BP: 109/79 113/78  ?Pulse: 75 77  ? ?There is no height or weight on file to calculate BMI. ? ? ?TODAY'S TREATMENT:   ? ?09/28/2021 ?TRANSFERS:  ?Sit<>stand: supervision with use of RW from/to mat table/wheelchair surfaces.  ? ?TherEx: ?Completed NuStep on Level 4 with BLE only x 6 minutes. PT providing cues to keep LLE in midline vs external rotation.  ? ?NMR: ?Sit to Stands with right lower extremity placed on 2" box for improved weight shifting on LLE, completed x 10 reps with PT providing cues for improved forward lean vs forward and lateral to R to improve equal weight shift. CGA.  ? ?Standing with right UE support (from chair): completed anterior and posterior stepping to target with RLE to focus on weight shift/stance on LLE. Completed x 3 reps with UE support, then transitioned to no UE support and completed x 10 reps. Min assist for balance with cues for weight shifting and upright posture.  ? ?  GAIT: ?Gait pattern: step through pattern, decreased step length- Right, decreased stance time- Left, decreased hip/knee flexion- Left, decreased ankle dorsiflexion- Left, Left foot flat, and narrow BOS ?Distance walked: 230 ft; plus additional 40 ft to NuStep ?Assistive device utilized: Environmental consultant - 2 wheeled and eft hand orthotic ?Level of assistance:  supervision to min guard assist ?Comments:  Continued cues for heel strike with left stepping, with improvements noted. Cues for increased step length with RLE due to shortened step noted. Completed with L Hand orthotic, did  not fall off a single time with session. Requesting information, educated OT will potentially provide hand orthotic for RW. No rest break required today.  ? ? ? ?PATIENT EDUCATION: ?Education details: continue with current HEP ?Person educated: Patient ?Education method: Explanation ?Education comprehension: verbalized understanding ?  ?  ?HOME EXERCISE PROGRAM: ?Access Code: [email or web address] ?URL: [email or web address] ?Date: 09/12/2021 ?Prepared by: Elease Etienne ? ?Exercises ?- Seated Long Arc Quad  - 1 x daily - 4 x weekly - 2 sets - 12 reps - 2 seconds hold ?- Sit to Stand with Armchair  - 1 x daily - 4 x weekly - 2 sets - 6 reps ?- Standing March with Counter Support  - 1 x daily - 4 x weekly - 2 sets - 20 reps  ?  - Supine Bridge  - 1 x daily - 5 x weekly - 2 sets - 12 reps ?- Lower Trunk Rotations  - 1 x daily - 4 x weekly - 2 sets - 10 reps ?- Supine March  - 1 x daily - 3 x weekly - 2 sets - 10 reps ?  - Seated Hamstring Curls with Resistance  - 1 x daily - 4 x weekly - 1 sets - 12 reps ?- Standing Single Leg Stance with Counter Support  - 1 x daily - 7 x weekly - 2 sets - 2 reps - 30 seconds hold ? ?GOALS: ?Goals reviewed with patient? Yes ?  ?SHORT TERM GOALS: Target date: 10/07/2021 ?  ?Pt will be independent with initial HEP for improved strengthening and balance  ?Baseline: No HEP established ?Goal status: INITIAL ?  ?2.  Pt will improve gait speed to >/= 0.75 ft/sec to demonstrate improved community ambulation ?Baseline: 0.46 ft/sec ?Goal status: INITIAL ?  ?3.  Pt will improve 5x STS to </= 45 sec to demo improved functional LE strength and balance  ?Baseline: 51.81 ?Goal status: INITIAL ?  ?4.  Pt will increase BERG balance score to 30/56 to demonstrate improved static balance. ?Baseline: 27/56 ?Goal status: INITIAL ?  ?5.  Pt will demonstrate TUG of <45 seconds in order to decrease risk of falls and improve functional mobility using LRAD. ?Baseline: 50.52sec ?Goal status: INITIAL ?  ?6.   Pt will be able to ambulate >/= 300 ft with LRAD and CGA to demo improved household mobility ?Baseline: 107 ft with RW and CGA ?Goal status: INITIAL ?  ?LONG TERM GOALS: Target date: 11/04/2021 ?  ?Pt will be independent with final progressive HEP for improved strengthening and balance  ?Baseline: No HEP established ?Goal status: INITIAL ?  ?2.  Pt will improve gait speed to >/= 1.0 ft/sec to demonstrate improved community ambulation ?Baseline: 0.46 ft/sec ?Goal status: INITIAL ?  ?3.  Pt will improve 5x STS to </= 35 sec to demo improved functional LE strength and balance  ?Baseline: 51.81 secs ?Goal status: INITIAL ?  ?4.  Pt will increase BERG balance score to 33/56 to demonstrate improved  static balance. ?Baseline: TBA ?Goal status: INITIAL ?  ?5.  Pt will demonstrate TUG of <40 seconds in order to decrease risk of falls and improve functional mobility using LRAD. ?Baseline: 50.52sec ?Goal status: INITIAL ?  ?6.  Pt will be able to ambulate >/= 500 ft with LRAD and supervison to demo improved household mobility ?Baseline: 107 ft with CGA and RW ?Goal status: INITIAL ?  ?ASSESSMENT: ?  ?CLINICAL IMPRESSION: ?Today's skilled session continued to address transfer training, gait training and LE strengthening/standing balance. Continued use of left hand orthotic on walker with continued improvements keeping LUE positioned. Patient able to ambulate increased distance today prior to needing rest break. Will continue to progress toward all LTGs.  ?  ?  ?  ?OBJECTIVE IMPAIRMENTS Abnormal gait, decreased activity tolerance, decreased balance, decreased coordination, decreased endurance, decreased knowledge of use of DME, decreased mobility, difficulty walking, decreased strength, decreased safety awareness, increased edema, impaired sensation, impaired tone, postural dysfunction, and pain.  ?  ?ACTIVITY LIMITATIONS cleaning, meal prep, laundry, and medication management.  ?  ?PERSONAL FACTORS Time since onset of  injury/illness/exacerbation and 3+ comorbidities: HTN, Pancreatisis, CVA, Stage III CKD  are also affecting patient's functional outcome.  ?  ?  ?REHAB POTENTIAL: Good ?  ?CLINICAL DECISION MAKING: Evolving/moderate comp

## 2021-10-05 ENCOUNTER — Ambulatory Visit: Payer: Medicaid Other

## 2021-10-05 ENCOUNTER — Ambulatory Visit: Payer: Medicaid Other | Admitting: Occupational Therapy

## 2021-10-05 ENCOUNTER — Encounter: Payer: Self-pay | Admitting: Occupational Therapy

## 2021-10-05 VITALS — BP 133/89 | HR 74

## 2021-10-05 DIAGNOSIS — R278 Other lack of coordination: Secondary | ICD-10-CM

## 2021-10-05 DIAGNOSIS — M6281 Muscle weakness (generalized): Secondary | ICD-10-CM

## 2021-10-05 DIAGNOSIS — R262 Difficulty in walking, not elsewhere classified: Secondary | ICD-10-CM

## 2021-10-05 DIAGNOSIS — R2681 Unsteadiness on feet: Secondary | ICD-10-CM

## 2021-10-05 DIAGNOSIS — R2689 Other abnormalities of gait and mobility: Secondary | ICD-10-CM | POA: Diagnosis not present

## 2021-10-05 DIAGNOSIS — I69854 Hemiplegia and hemiparesis following other cerebrovascular disease affecting left non-dominant side: Secondary | ICD-10-CM

## 2021-10-05 DIAGNOSIS — R208 Other disturbances of skin sensation: Secondary | ICD-10-CM

## 2021-10-05 NOTE — Therapy (Signed)
?OUTPATIENT PHYSICAL THERAPY TREATMENT NOTE ? ? ?Patient Name: Shirley Hansen ?MRN: [id number] ?DOB:[birth year], 56 y.o., female ?Today's Date: 10/05/2021 ? ?PCP: Gildardo Pounds, NP ?REFERRING PROVIDER: Gildardo Pounds, NP ? ? PT End of Session - 10/05/21 1447   ? ? Visit Number 7   ? Number of Visits 17   ? Date for PT Re-Evaluation 11/11/21   pushed out due to scheduling  ? Authorization Type UHC Medicaid; VL: 27   ? PT Start Time 1102   ? PT Stop Time 1530   ? PT Time Calculation (min) 44 min   ? Equipment Utilized During Treatment Gait belt;Other (comment)   hand orthotic  ? Activity Tolerance Patient tolerated treatment well   ? Behavior During Therapy [facility] for tasks assessed/performed   ? ?  ?  ? ?  ? ? ? ?Past Medical History:  ?Diagnosis Date  ? Hypertension   ? Pancreatitis   ? Stage 3a chronic kidney disease ([place])   ? Stroke [facility])   ? ?Past Surgical History:  ?Procedure Laterality Date  ? TUBAL LIGATION    ? ?Patient Active Problem List  ? Diagnosis Date Noted  ? Acute encephalopathy 04/09/2021  ? Hypertensive urgency 04/09/2021  ? Hemiparesis affecting left side as late effect of cerebrovascular accident (CVA) ([place]) 04/07/2021  ? Muscle spasticity 04/07/2021  ? History of stroke 03/23/2021  ? Chronic left shoulder pain 03/23/2021  ? Depression 03/23/2021  ? Glaucoma of both eyes 03/23/2021  ? Dizziness 03/13/2021  ? Orthostatic hypotension 03/13/2021  ? Weakness 03/05/2021  ? COVID-19 07/14/2020  ? Stroke [facility])   ? Stage 3a chronic kidney disease ([place])   ? Hypertension   ? ? ?REFERRING DIAG: I69.354 (ICD-10-CM) - Hemiparesis affecting left side as late effect of cerebrovascular accident (CVA) ([place]) ? ?THERAPY DIAG:  ?Unsteadiness on feet ? ?Muscle weakness (generalized) ? ?Other abnormalities of gait and mobility ? ?Difficulty in walking, not elsewhere classified ? ?PERTINENT HISTORY: HTN, Pancreatisis, CVA, Stage III CKD ? ?PRECAUTIONS: Fall ? ?SUBJECTIVE:  Reports the eye is still bothering her; has no  update on the appointment. No falls. Reports BP was good with Occupational Therapy.  ? ?PAIN:  ?Are you having pain? Yes: NPRS scale: 7/10 ?Pain location: L shoulder blade to left under arm ?Pain description: stiffness ?Aggravating factors: being cold ?Relieving factors: heat ?  ?There were no vitals filed for this visit. ? ?There is no height or weight on file to calculate BMI. ? ? ?TODAY'S TREATMENT:   ? ?GAIT: ?Gait pattern: step through pattern, decreased step length- Right, decreased stance time- Left, decreased hip/knee flexion- Left, decreased ankle dorsiflexion- Left, Left foot flat, and narrow BOS ?Distance walked: 245 ft ?Assistive device utilized: Environmental consultant - 2 wheeled and left hand orthotic ?Level of assistance:  supervision to min guard assist ?Comments: with use of RW and left hand orthotic; intermittent spasms noted in LLE today. 2 standing rest breaks required with ambulation of 245 ft.  ?Gait Speed: 35.09 secs = 0.93 ft/sec  ? ?   ?FUNCTIONAL TESTs:  ?5 times sit to stand: 35.68 secs with UE; require cues to stand up tall, decreased control with descent ? ?TUG: 34.29 secs (started timer upon standing due to having to place L hand in hand orthotic) ?  ? [facility] PT Assessment - 10/05/21 0001   ? ?  ? Standardized Balance Assessment  ? Standardized Balance Assessment Berg Balance Test   ?  ? Berg Balance Test  ? Sit to  Stand Able to stand  independently using hands   ? Standing Unsupported Able to stand safely 2 minutes   ? Sitting with Back Unsupported but Feet Supported on Floor or Stool Able to sit safely and securely 2 minutes   ? Stand to Sit Controls descent by using hands   ? Transfers Able to transfer with verbal cueing and /or supervision   ? Standing Unsupported with Eyes Closed Able to stand 10 seconds safely   ? Standing Unsupported with Feet Together Able to place feet together independently and stand for 1 minute with supervision   44.65 secs  ? From Standing, Reach Forward with Outstretched  Arm Can reach forward >5 cm safely (2")   4"  ? From Standing Position, Pick up Object from Floor Able to pick up shoe, needs supervision   ? From Standing Position, Turn to Look Behind Over each Shoulder Turn sideways only but maintains balance   ? Turn 360 Degrees Needs close supervision or verbal cueing   ? Standing Unsupported, Alternately Place Feet on Step/Stool Able to complete >2 steps/needs minimal assist   ? Standing Unsupported, One Foot in Front Needs help to step but can hold 15 seconds   ? Standing on One Leg Tries to lift leg/unable to hold 3 seconds but remains standing independently   ? Total Score 34   ? ?  ?  ? ?  ? ? ?PATIENT EDUCATION: ?Education details: Progress toward STGs; Continue with HEP ?Person educated: Patient ?Education method: Explanation ?Education comprehension: verbalized understanding ?  ?  ?HOME EXERCISE PROGRAM: ?Access Code: [email or web address] ?URL: [email or web address] ?Date: 09/12/2021 ?Prepared by: Elease Etienne ? ? ?GOALS: ?Goals reviewed with patient? Yes ?  ?SHORT TERM GOALS: Target date: 10/07/2021 ?  ?Pt will be independent with initial HEP for improved strengthening and balance  ?Baseline: No HEP established; reports independence with current HEP ?Goal status: MET ?  ?2.  Pt will improve gait speed to >/= 0.75 ft/sec to demonstrate improved community ambulation ?Baseline: 0.46 ft/sec; 0.93 ft/sec  ?Goal status: MET ?  ?3.  Pt will improve 5x STS to </= 45 sec to demo improved functional LE strength and balance  ?Baseline: 51.81; 35. 68 secs  ?Goal status: MET ?  ?4.  Pt will increase BERG balance score to 30/56 to demonstrate improved static balance. ?Baseline: 27/56; 34/56 ?Goal status: MET ?  ?5.  Pt will demonstrate TUG of <45 seconds in order to decrease risk of falls and improve functional mobility using LRAD. ?Baseline: 50.52sec; 34.29 secs ?Goal status: MET ?  ?6.  Pt will be able to ambulate >/= 300 ft with LRAD and CGA to demo improved household  mobility ?Baseline: 107 ft with RW and CGA; 245 ft with CGA and RW ?Goal status: INITIAL ?  ?LONG TERM GOALS: Target date: 11/04/2021 ?  ?Pt will be independent with final progressive HEP for improved strengthening and balance  ?Baseline: No HEP established ?Goal status: INITIAL ?  ?2.  Pt will improve gait speed to >/= 1.0 ft/sec to demonstrate improved community ambulation ?Baseline: 0.46 ft/sec ?Goal status: INITIAL ?  ?3.  Pt will improve 5x STS to </= 35 sec to demo improved functional LE strength and balance  ?Baseline: 51.81 secs ?Goal status: INITIAL ?  ?4.  Pt will increase BERG balance score to 33/56 to demonstrate improved static balance. ?Baseline: TBA ?Goal status: INITIAL ?  ?5.  Pt will demonstrate TUG of <40 seconds in order to decrease risk of falls and  improve functional mobility using LRAD. ?Baseline: 50.52sec ?Goal status: INITIAL ?  ?6.  Pt will be able to ambulate >/= 500 ft with LRAD and supervison to demo improved household mobility ?Baseline: 107 ft with CGA and RW ?Goal status: INITIAL ?  ?ASSESSMENT: ?  ?CLINICAL IMPRESSION: ?Today's skilled session focused on assessment towards patient's STGs. Patient able to meet all STGs today demonstrating improved balance, improved gait distance and improved gait speed. Patient demo improvements on Berg Balance, TUG and 5x sit <> stand but scores still place patient at elevated fall risk. Patient is making steady progress with PT services. Will continue to progress toward all LTGs.  ?  ?  ?  ?OBJECTIVE IMPAIRMENTS Abnormal gait, decreased activity tolerance, decreased balance, decreased coordination, decreased endurance, decreased knowledge of use of DME, decreased mobility, difficulty walking, decreased strength, decreased safety awareness, increased edema, impaired sensation, impaired tone, postural dysfunction, and pain.  ?  ?ACTIVITY LIMITATIONS cleaning, meal prep, laundry, and medication management.  ?  ?PERSONAL FACTORS Time since onset of  injury/illness/exacerbation and 3+ comorbidities: HTN, Pancreatisis, CVA, Stage III CKD  are also affecting patient's functional outcome.  ?  ?  ?REHAB POTENTIAL: Good ?  ?CLINICAL DECISION MAKING: Evolving/moderate c

## 2021-10-05 NOTE — Therapy (Signed)
?OUTPATIENT OCCUPATIONAL THERAPY TREATMENT NOTE ? ? ?Patient Name: Shirley Hansen ?MRN: [id number] ?DOB:Nov 11, 1965, 56 y.o., female ?Today's Date: 10/05/2021 ? ?PCP: Gildardo Pounds, NP ?REFERRING PROVIDER: Gildardo Pounds, NP ? ?END OF SESSION:  ? OT End of Session - 10/05/21 1414   ? ? Visit Number 5   ? Number of Visits 17   ? Date for OT Re-Evaluation 11/24/21   ? Authorization Type UHC Medicaid (pt reports Medicare will activate May 1st)   ? Authorization Time Period $4 copay VL: 27 combined   ? OT Start Time 1409   arrival time  ? OT Stop Time [id number]   ? OT Time Calculation (min) 36 min   ? Activity Tolerance Patient tolerated treatment well   ? Behavior During Therapy [facility] for tasks assessed/performed   ? ?  ?  ? ?  ? ? ? ?Past Medical History:  ?Diagnosis Date  ? Hypertension   ? Pancreatitis   ? Stage 3a chronic kidney disease ([place])   ? Stroke [facility])   ? ?Past Surgical History:  ?Procedure Laterality Date  ? TUBAL LIGATION    ? ?Patient Active Problem List  ? Diagnosis Date Noted  ? Acute encephalopathy 04/09/2021  ? Hypertensive urgency 04/09/2021  ? Hemiparesis affecting left side as late effect of cerebrovascular accident (CVA) ([place]) 04/07/2021  ? Muscle spasticity 04/07/2021  ? History of stroke 03/23/2021  ? Chronic left shoulder pain 03/23/2021  ? Depression 03/23/2021  ? Glaucoma of both eyes 03/23/2021  ? Dizziness 03/13/2021  ? Orthostatic hypotension 03/13/2021  ? Weakness 03/05/2021  ? COVID-19 07/14/2020  ? Stroke [facility])   ? Stage 3a chronic kidney disease ([place])   ? Hypertension   ? ? ?ONSET DATE:  Patient had a stroke in Nov 2020, referral 09/08/2021   ? ?REFERRING DIAG: R29.898 (ICD-10-CM) - LUE weakness   ? ?THERAPY DIAG:  ?Unsteadiness on feet ? ?Other abnormalities of gait and mobility ? ?Muscle weakness (generalized) ? ?Hemiplegia and hemiparesis following other cerebrovascular disease affecting left non-dominant side ([place]) ? ?Other lack of coordination ? ?Other disturbances of skin  sensation ? ? ?PERTINENT HISTORY: HTN, Pancreatisis, CVA, Stage III CKD  ? ?PRECAUTIONS: fall ? ?SUBJECTIVE:  Pt is reporting discomfort and disturbances with vision in Lt eye - history of glaucoma. ? ?PAIN:  ?Are you having pain? Yes: NPRS scale: 7/10 ?Pain location: LUE neck and distal ?Pain description: tightness ?Aggravating factors: moving it ?Relieving factors: rest ? ? ? ?OBJECTIVE:  ? ?TODAY'S TREATMENT: ? ?10/05/21 ? ?Supine - working on scap retraction and strengthening and supine for shoulder stability with chest press, shoulder flexion and horizontal abduction x 10 reps each. ? ?Manual Therapy with gentle PROM to LUE for determining range of motion and decrease pain in LUE shoulder. ? ?Sidelying with bodyweight on left side/arm with completing AROM for LUE at all joints. In pain free zones. ? ?Issued patient with walker splint for LUE  ? ? ?Vitals:  ? 10/05/21 1435  ?BP: 133/89  ?Pulse: 74  ? ? ?  ?  ?HOME EXERCISE PROGRAM: ?  ?09/26/21 Table Slides, flipping cards ?  ?  ?  ?GOALS: ?Goals reviewed with patient? No ?  ?SHORT TERM GOALS: Target date: 10/11/2021 ?  ?Pt will be independent with HEP. ?Baseline: not issued at evaluation ?Goal status: Achieved ?  ?2.  Pt will increase box and blocks score by 4 blocks or greater with LUE. ?Baseline: Right 56 blocks, Left 15 blocks ?Goal status: Ongoing ?  ?  3.  Pt will improve grip strength by 5 lbs or greater with LUE. ?Baseline: Right: 70.3 lbs; Left: 34.1 lbs ?Goal status: Ongoing ?  ?4.  Pt will verbalize understanding of adapted strategies and/or equipment PRN to increase safety and independence with ADLs and IADLs (I.e. chopping and peeling for meal prep) ?Baseline: difficulty with chopping vegetables and peeling ?Goal status: Ongoing ?  ?  ?LONG TERM GOALS: Target date: 11/24/21 ?  ?Pt will be independent with updated HEP. ?Baseline: not issued at evaluation ?Goal status: Ongoing ?  ?2.  Pt will increase fine motor coordination in LUE by improving 9 hole peg  test by achieving 5 pegs in 60 seconds or less. ?Baseline: Right: 23.82 sec; Left: 0 pegs in 60 sec ?Goal status: Ongoing ?  ?3.  Pt will increase box and blocks score by 10 blocks or greater with LUE. ?Baseline: Right 56 blocks, Left 15 blocks ?Goal status: Ongoing ?  ?4.  Pt will improve grip strength by 10 lbs or greater with LUE ?Baseline: Right: 70.3 lbs; Left: 34.1 lbs ?Goal status: Ongoing ? ?5.  Pt will perform all basic ADLs with mod I ?Baseline: continues to need some help with grooming and fasteners ?Goal status: Ongoing ?  ?ASSESSMENT: ?  ?CLINICAL IMPRESSION: ?Pt continues with significant pain in LUE shoulder impeding overall participation in therapeutic sessions/interventions. ?  ?  ?PLAN: ?OT FREQUENCY: 2x/week ?  ?OT DURATION: 10 weeks 16 visits over 10 weeks d/t any scheduling conflicts ?  ?PLANNED INTERVENTIONS: self care/ADL training, therapeutic exercise, therapeutic activity, neuromuscular re-education, manual therapy, passive range of motion, functional mobility training, aquatic therapy, splinting, electrical stimulation, ultrasound, fluidotherapy, moist heat, cryotherapy, patient/family education, visual/perceptual remediation/compensation, and DME and/or AE instructions ?  ?PLAN FOR NEXT SESSION:  start checking STGs. Work on [employer] and working on pain relief. Possible taping to LUE? ? ? ? ? ?Zachery Conch, OT ?10/05/2021, 3:18 PM ? ?  ? ?  ?

## 2021-10-10 ENCOUNTER — Ambulatory Visit: Payer: Medicare Other

## 2021-10-12 ENCOUNTER — Ambulatory Visit: Payer: Medicare Other | Admitting: Occupational Therapy

## 2021-10-12 ENCOUNTER — Ambulatory Visit: Payer: Medicare Other | Attending: Nurse Practitioner

## 2021-10-12 ENCOUNTER — Encounter: Payer: Self-pay | Admitting: Occupational Therapy

## 2021-10-12 VITALS — BP 116/88 | HR 83

## 2021-10-12 DIAGNOSIS — M6281 Muscle weakness (generalized): Secondary | ICD-10-CM

## 2021-10-12 DIAGNOSIS — R208 Other disturbances of skin sensation: Secondary | ICD-10-CM | POA: Insufficient documentation

## 2021-10-12 DIAGNOSIS — I69854 Hemiplegia and hemiparesis following other cerebrovascular disease affecting left non-dominant side: Secondary | ICD-10-CM | POA: Insufficient documentation

## 2021-10-12 DIAGNOSIS — R2681 Unsteadiness on feet: Secondary | ICD-10-CM | POA: Insufficient documentation

## 2021-10-12 DIAGNOSIS — R278 Other lack of coordination: Secondary | ICD-10-CM | POA: Diagnosis not present

## 2021-10-12 DIAGNOSIS — R262 Difficulty in walking, not elsewhere classified: Secondary | ICD-10-CM | POA: Diagnosis not present

## 2021-10-12 DIAGNOSIS — R2689 Other abnormalities of gait and mobility: Secondary | ICD-10-CM | POA: Diagnosis not present

## 2021-10-12 NOTE — Therapy (Signed)
?OUTPATIENT PHYSICAL THERAPY TREATMENT NOTE ? ? ?Patient Name: Shirley Hansen ?MRN: [id number] ?DOB:29-Dec-1965, 55 y.o., female ?Today's Date: 10/12/2021 ? ?PCP: Gildardo Pounds, NP ?REFERRING PROVIDER: Gildardo Pounds, NP ? ? PT End of Session - 10/12/21 1359   ? ? Visit Number 8   ? Number of Visits 17   ? Date for PT Re-Evaluation 11/11/21   pushed out due to scheduling  ? Authorization Type UHC Medicaid; VL: 27   ? PT Start Time 1400   ? PT Stop Time [id number]   ? PT Time Calculation (min) 45 min   ? Equipment Utilized During Treatment Gait belt;Other (comment)   hand orthotic  ? Activity Tolerance Patient tolerated treatment well   ? Behavior During Therapy [facility] for tasks assessed/performed   ? ?  ?  ? ?  ? ? ? ?Past Medical History:  ?Diagnosis Date  ? Hypertension   ? Pancreatitis   ? Stage 3a chronic kidney disease ([place])   ? Stroke [facility])   ? ?Past Surgical History:  ?Procedure Laterality Date  ? TUBAL LIGATION    ? ?Patient Active Problem List  ? Diagnosis Date Noted  ? Acute encephalopathy 04/09/2021  ? Hypertensive urgency 04/09/2021  ? Hemiparesis affecting left side as late effect of cerebrovascular accident (CVA) ([place]) 04/07/2021  ? Muscle spasticity 04/07/2021  ? History of stroke 03/23/2021  ? Chronic left shoulder pain 03/23/2021  ? Depression 03/23/2021  ? Glaucoma of both eyes 03/23/2021  ? Dizziness 03/13/2021  ? Orthostatic hypotension 03/13/2021  ? Weakness 03/05/2021  ? COVID-19 07/14/2020  ? Stroke [facility])   ? Stage 3a chronic kidney disease ([place])   ? Hypertension   ? ? ?REFERRING DIAG: I69.354 (ICD-10-CM) - Hemiparesis affecting left side as late effect of cerebrovascular accident (CVA) ([place]) ? ?THERAPY DIAG:  ?Unsteadiness on feet ? ?Other abnormalities of gait and mobility ? ?Muscle weakness (generalized) ? ?Difficulty in walking, not elsewhere classified ? ?PERTINENT HISTORY: HTN, Pancreatisis, CVA, Stage III CKD ? ?PRECAUTIONS: Fall ? ?SUBJECTIVE:  Reports no new changes/complaints. Patient reports no  falls. Has not used the hand orthotic on the walker.  ? ?PAIN:  ?Are you having pain? No; Reports just stiffness ?  ?Today's Vitals  ? 10/12/21 1406  ?BP: 116/88  ?Pulse: 83  ? ? ?There is no height or weight on file to calculate BMI. ? ? ?TODAY'S TREATMENT:   ? ?GAIT: ?Gait pattern: step through pattern, decreased step length- Right, decreased stance time- Left, decreased hip/knee flexion- Left, decreased ankle dorsiflexion- Left, Left foot flat, and narrow BOS ?Distance walked: 345 ft ?Assistive device utilized: Environmental consultant - 2 wheeled and left hand orthotic ?Level of assistance:  supervision to min guard assist ?Comments: with use of RW and left hand orthotic; ambulation x 345 ft with one standing rest break required. Cues for increased step length on RLE to improve weight shift onto LLE. Patient able to complete without seated rest break, just one standing rest break required. PT having conversation about more frequent ambulation at home since we now have the hand orthotic. Encouraged patient to get up and ambulate at least once every hour, if not more throughout the day and completing it more functionally vs reliance on the w/c. Patient in agreement ?   ?NMR: ?In // bars completed steps forward with RLE without UE support x 10 reps to focus on weight shift onto LLE and stance time. More challenge noted with posterior stepping. CGA. Cues for increased step length. Then  with light UE support added additional challenge by steps over/back black balance beam x 15 reps. Single UE support required and PT facilitating at pelvis for improved weight shift onto L side  ? ?With use of 6" Step, completed forward step ups with LLE for strengthening (UE support), completed x 15 reps. Then transitioned to lateral steps up with LLE x 15 reps. Increased cues required for lateral step up for improved weight shift, PT facilitation at pelvis. CGA. Intermittent rest breaks due to fatigue.  ? ? ?PATIENT EDUCATION: ?Education details:  Continue with HEP; Ambulation within the home ?Person educated: Patient ?Education method: Explanation ?Education comprehension: verbalized understanding ?  ?  ?HOME EXERCISE PROGRAM: ?Access Code: [email or web address] ?URL: [email or web address] ?Date: 09/12/2021 ?Prepared by: Elease Etienne ? ? ?GOALS: ?Goals reviewed with patient? Yes ?  ?SHORT TERM GOALS: Target date: 10/07/2021 ?  ?Pt will be independent with initial HEP for improved strengthening and balance  ?Baseline: No HEP established; reports independence with current HEP ?Goal status: MET ?  ?2.  Pt will improve gait speed to >/= 0.75 ft/sec to demonstrate improved community ambulation ?Baseline: 0.46 ft/sec; 0.93 ft/sec  ?Goal status: MET ?  ?3.  Pt will improve 5x STS to </= 45 sec to demo improved functional LE strength and balance  ?Baseline: 51.81; 35. 68 secs  ?Goal status: MET ?  ?4.  Pt will increase BERG balance score to 30/56 to demonstrate improved static balance. ?Baseline: 27/56; 34/56 ?Goal status: MET ?  ?5.  Pt will demonstrate TUG of <45 seconds in order to decrease risk of falls and improve functional mobility using LRAD. ?Baseline: 50.52sec; 34.29 secs ?Goal status: MET ?  ?6.  Pt will be able to ambulate >/= 300 ft with LRAD and CGA to demo improved household mobility ?Baseline: 107 ft with RW and CGA; 245 ft with CGA and RW ?Goal status: INITIAL ?  ?LONG TERM GOALS: Target date: 11/04/2021 ?  ?Pt will be independent with final progressive HEP for improved strengthening and balance  ?Baseline: No HEP established ?Goal status: INITIAL ?  ?2.  Pt will improve gait speed to >/= 1.0 ft/sec to demonstrate improved community ambulation ?Baseline: 0.46 ft/sec ?Goal status: INITIAL ?  ?3.  Pt will improve 5x STS to </= 35 sec to demo improved functional LE strength and balance  ?Baseline: 51.81 secs ?Goal status: INITIAL ?  ?4.  Pt will increase BERG balance score to 33/56 to demonstrate improved static balance. ?Baseline: TBA ?Goal  status: INITIAL ?  ?5.  Pt will demonstrate TUG of <40 seconds in order to decrease risk of falls and improve functional mobility using LRAD. ?Baseline: 50.52sec ?Goal status: INITIAL ?  ?6.  Pt will be able to ambulate >/= 500 ft with LRAD and supervison to demo improved household mobility ?Baseline: 107 ft with CGA and RW ?Goal status: INITIAL ?  ?ASSESSMENT: ?  ?CLINICAL IMPRESSION: ?Today's skilled session focused on continued gait training with patient continue to demo improved tolerance for longer ambulation distance. PT having conversation with patient regarding more freq ambulation within the home to continue to promote improved functional mobility and activity tolerance. Continue NMR focused on weight shift/stance activities for LLE. Will continue per POC.  ?  ?  ?  ?OBJECTIVE IMPAIRMENTS Abnormal gait, decreased activity tolerance, decreased balance, decreased coordination, decreased endurance, decreased knowledge of use of DME, decreased mobility, difficulty walking, decreased strength, decreased safety awareness, increased edema, impaired sensation, impaired tone, postural dysfunction, and pain.  ?  ?ACTIVITY LIMITATIONS  cleaning, meal prep, laundry, and medication management.  ?  ?PERSONAL FACTORS Time since onset of injury/illness/exacerbation and 3+ comorbidities: HTN, Pancreatisis, CVA, Stage III CKD  are also affecting patient's functional outcome.  ?  ?  ?REHAB POTENTIAL: Good ?  ?CLINICAL DECISION MAKING: Evolving/moderate complexity ?  ?EVALUATION COMPLEXITY: Moderate ?  ?PLAN: ?PT FREQUENCY: 2x/week ?  ?PT DURATION: 8 weeks (may need to decreased duration based upon OT and Pelvic Floor Needs ?  ?PLANNED INTERVENTIONS: Therapeutic exercises, Therapeutic activity, Neuromuscular re-education, Balance training, Gait training, Patient/Family education, Joint manipulation, Joint mobilization, Stair training, Vestibular training, Orthotic/Fit training, DME instructions, Aquatic Therapy, Cryotherapy,  and Moist heat ?  ?PLAN FOR NEXT SESSION: Continue use of hand orthotic.  continue with LE strengthening and stance stability with emphasis on hip/knee flexion. Work on both Research scientist (physical sciences).

## 2021-10-12 NOTE — Therapy (Signed)
?OUTPATIENT OCCUPATIONAL THERAPY TREATMENT NOTE ? ? ?Patient Name: Shirley Hansen ?MRN: [id number] ?DOB:[birth year], 56 y.o., female ?Today's Date: 10/12/2021 ? ?PCP: Gildardo Pounds, NP ?REFERRING PROVIDER: Gildardo Pounds, NP ? ?END OF SESSION:  ? OT End of Session - 10/12/21 1644   ? ? Visit Number 6   ? Number of Visits 17   ? Date for OT Re-Evaluation 11/24/21   ? Authorization Type UHC Medicaid (pt reports Medicare will activate May 1st)   ? Authorization Time Period $4 copay VL: 27 combined   ? OT Start Time 3614   ? OT Stop Time 1530   ? OT Time Calculation (min) 45 min   ? Activity Tolerance Patient tolerated treatment well   ? Behavior During Therapy [facility] for tasks assessed/performed   ? ?  ?  ? ?  ? ? ? ?Past Medical History:  ?Diagnosis Date  ? Hypertension   ? Pancreatitis   ? Stage 3a chronic kidney disease ([place])   ? Stroke [facility])   ? ?Past Surgical History:  ?Procedure Laterality Date  ? TUBAL LIGATION    ? ?Patient Active Problem List  ? Diagnosis Date Noted  ? Acute encephalopathy 04/09/2021  ? Hypertensive urgency 04/09/2021  ? Hemiparesis affecting left side as late effect of cerebrovascular accident (CVA) ([place]) 04/07/2021  ? Muscle spasticity 04/07/2021  ? History of stroke 03/23/2021  ? Chronic left shoulder pain 03/23/2021  ? Depression 03/23/2021  ? Glaucoma of both eyes 03/23/2021  ? Dizziness 03/13/2021  ? Orthostatic hypotension 03/13/2021  ? Weakness 03/05/2021  ? COVID-19 07/14/2020  ? Stroke [facility])   ? Stage 3a chronic kidney disease ([place])   ? Hypertension   ? ? ?ONSET DATE:  Patient had a stroke in Nov 2020, referral 09/08/2021   ? ?REFERRING DIAG: R29.898 (ICD-10-CM) - LUE weakness   ? ?THERAPY DIAG:  ?Unsteadiness on feet ? ?Muscle weakness (generalized) ? ?Hemiplegia and hemiparesis following other cerebrovascular disease affecting left non-dominant side ([place]) ? ?Other lack of coordination ? ?Other disturbances of skin sensation ? ? ?PERTINENT HISTORY: HTN, Pancreatisis, CVA, Stage III CKD   ? ?PRECAUTIONS: fall ? ?SUBJECTIVE:  Pt is reporting discomfort and disturbances with vision in Lt eye - history of glaucoma. ? ?PAIN:  ?Are you having pain? Yes: NPRS scale: 7/10 ?Pain location: LUE neck and distal ?Pain description: tightness ?Aggravating factors: moving it ?Relieving factors: rest ? ? ? ?OBJECTIVE:  ? ?TODAY'S TREATMENT: ? ?10/12/21: ?Supine:  Working on body on arm motion and stretching shoulder with elbow extension.  Patient able to roll up onto Left arm today and actively work on scapular stabilizers in sidesitting.   ?Scapular mobilization followed by active scapular motion in all planes, then with resistance.   ? ?Checked short term goals:  Patient has not met grip strength or coordination goal.  Patient with increased ataxia with all attempts to move LUE faster.  Did much better with cue to keep part of UE on surface.   ? ? ? ?10/05/21 ? ?Supine - working on scap retraction and strengthening and supine for shoulder stability with chest press, shoulder flexion and horizontal abduction x 10 reps each. ? ?Manual Therapy with gentle PROM to LUE for determining range of motion and decrease pain in LUE shoulder. ? ?Sidelying with bodyweight on left side/arm with completing AROM for LUE at all joints. In pain free zones. ? ?Issued patient with walker splint for LUE  ? ? ?There were no vitals filed for this visit. ? ? ?  ?  ?  HOME EXERCISE PROGRAM: ?  ?09/26/21 Table Slides, flipping cards ?  ?  ?  ?GOALS: ?Goals reviewed with patient? No ?  ?SHORT TERM GOALS: Target date: 10/11/2021 ?  ?Pt will be independent with HEP. ?Baseline: not issued at evaluation ?Goal status: Achieved ?  ?2.  Pt will increase box and blocks score by 4 blocks or greater with LUE. ?Baseline: Right 56 blocks, Left 15 blocks ?5/3 - 16 Blocks - increased ataxia with speed ?Goal status: Ongoing ?  ?3.  Pt will improve grip strength by 5 lbs or greater with LUE. ?Baseline: Right: 70.3 lbs; Left: 34.1 lbs  ?5/3- 30.1 lb LUE ?Goal  status: Ongoing ?  ?4.  Pt will verbalize understanding of adapted strategies and/or equipment PRN to increase safety and independence with ADLs and IADLs (I.e. chopping and peeling for meal prep) ?Baseline: difficulty with chopping vegetables and peeling ?Goal status: Ongoing - 5/3 - reports attempting to peel a mango - but unable to stabilize mango in left hand - slipped out ?  ?  ?LONG TERM GOALS: Target date: 11/24/21 ?  ?Pt will be independent with updated HEP. ?Baseline: not issued at evaluation ?Goal status: Ongoing ?  ?2.  Pt will increase fine motor coordination in LUE by improving 9 hole peg test by achieving 5 pegs in 60 seconds or less. ?Baseline: Right: 23.82 sec; Left: 0 pegs in 60 sec ?Goal status: Ongoing ?  ?3.  Pt will increase box and blocks score by 10 blocks or greater with LUE. ?Baseline: Right 56 blocks, Left 15 blocks ?Goal status: Ongoing ?  ?4.  Pt will improve grip strength by 10 lbs or greater with LUE ?Baseline: Right: 70.3 lbs; Left: 34.1 lbs ?Goal status: Ongoing ? ?5.  Pt will perform all basic ADLs with mod I ?Baseline: continues to need some help with grooming and fasteners ?Goal status: Ongoing ?  ?ASSESSMENT: ?  ?CLINICAL IMPRESSION: ?Pt continues with significant tension in LUE shoulder although reports improvement in pain - ataxia / muscle imbalance impeding overall participation in therapeutic sessions/interventions. ?  ?  ?PLAN: ?OT FREQUENCY: 2x/week ?  ?OT DURATION: 10 weeks 16 visits over 10 weeks d/t any scheduling conflicts ?  ?PLANNED INTERVENTIONS: self care/ADL training, therapeutic exercise, therapeutic activity, neuromuscular re-education, manual therapy, passive range of motion, functional mobility training, aquatic therapy, splinting, electrical stimulation, ultrasound, fluidotherapy, moist heat, cryotherapy, patient/family education, visual/perceptual remediation/compensation, and DME and/or AE instructions ?  ?PLAN FOR NEXT SESSION:  Scapular mobilization, guided  movement, or partially closed chain movement to reduce ataxia ? ? ? ? ?Mariah Milling, OT ?10/12/2021, 4:45 PM ? ?  ? ?  ?

## 2021-10-14 ENCOUNTER — Other Ambulatory Visit: Payer: Self-pay | Admitting: Nurse Practitioner

## 2021-10-14 DIAGNOSIS — I10 Essential (primary) hypertension: Secondary | ICD-10-CM

## 2021-10-14 MED ORDER — AMLODIPINE BESYLATE 10 MG PO TABS: 10.0000 mg | ORAL_TABLET | Freq: Every day | ORAL | 0 refills | Status: DC

## 2021-10-14 NOTE — Telephone Encounter (Signed)
Copied from CRM [id number]. Topic: General - Other ?>> Oct 14, 2021 12:53 PM Gaetana Michaelis A wrote: ?Reason for CRM: Medication Refill - Medication: Rx #: [id number]  ?amLODipine (NORVASC) 10 MG tablet [[id number]] - patient has 0 tablets remaining  ? ?Has the patient contacted their pharmacy? Yes.  The patient was directed to contact their PCP ?(Agent: If no, request that the patient contact the pharmacy for the refill. If patient does not wish to contact the pharmacy document the reason why and proceed with request.) ?(Agent: If yes, when and what did the pharmacy advise?) ? ?Preferred Pharmacy (with phone number or street name): [facility] [phone] [facility], [place] - [address] ?[place] [address] ?Phone: [phone] Fax: [phone] ?Hours: Not open 24 hours ? ?Has the patient been seen for an appointment in the last year OR does the patient have an upcoming appointment? Yes.   ? ?Agent: Please be advised that RX refills may take up to 3 business days. We ask that you follow-up with your pharmacy. ?

## 2021-10-14 NOTE — Telephone Encounter (Signed)
Change of pharmacy ?Requested Prescriptions  ?Pending Prescriptions Disp Refills  ?? amLODipine (NORVASC) 10 MG tablet 90 tablet 0  ?  Sig: Take 1 tablet (10 mg total) by mouth daily.  ?  ? Cardiovascular: Calcium Channel Blockers 2 Passed - 10/14/2021  1:25 PM  ?  ?  Passed - Last BP in normal range  ?  BP Readings from Last 1 Encounters:  ?10/12/21 116/88  ?   ?  ?  Passed - Last Heart Rate in normal range  ?  Pulse Readings from Last 1 Encounters:  ?10/12/21 83  ?   ?  ?  Passed - Valid encounter within last 6 months  ?  Recent Outpatient Visits   ?      ? 2 months ago Hemiparesis affecting left side as late effect of cerebrovascular accident (CVA) (HCC)  ? [facility] [place], Shea Stakes, NP  ? 5 months ago Encounter to establish care  ? [facility] [place], [place], NP  ? 6 months ago Hemiparesis affecting left side as late effect of cerebrovascular accident (CVA) (HCC)  ? [facility] [place], [place]ari S, [place]  ? 6 months ago Primary hypertension  ? [facility] [place], [place], [place]  ?  ?  ?Future Appointments   ?        ? In 3 days Claiborne Rigg, NP [facility]  ?  ? ?  ?  ?  ? ? ?

## 2021-10-17 ENCOUNTER — Encounter: Payer: Self-pay | Admitting: Nurse Practitioner

## 2021-10-17 ENCOUNTER — Ambulatory Visit: Payer: Medicare Other | Attending: Nurse Practitioner | Admitting: Nurse Practitioner

## 2021-10-17 VITALS — BP 127/88 | HR 86 | Temp 98.0°F | Resp 16 | Wt 193.0 lb

## 2021-10-17 DIAGNOSIS — Z1159 Encounter for screening for other viral diseases: Secondary | ICD-10-CM | POA: Diagnosis not present

## 2021-10-17 DIAGNOSIS — G8929 Other chronic pain: Secondary | ICD-10-CM

## 2021-10-17 DIAGNOSIS — I10 Essential (primary) hypertension: Secondary | ICD-10-CM

## 2021-10-17 DIAGNOSIS — M545 Low back pain, unspecified: Secondary | ICD-10-CM

## 2021-10-17 DIAGNOSIS — J381 Polyp of vocal cord and larynx: Secondary | ICD-10-CM

## 2021-10-17 MED ORDER — TIZANIDINE HCL 4 MG PO TABS: 4.0000 mg | ORAL_TABLET | Freq: Four times a day (QID) | ORAL | 2 refills | Status: DC | PRN

## 2021-10-17 MED ORDER — HYDROCHLOROTHIAZIDE 25 MG PO TABS: 12.5000 mg | ORAL_TABLET | Freq: Every day | ORAL | 1 refills | Status: DC

## 2021-10-17 NOTE — Progress Notes (Signed)
? ?Assessment & Plan:  ?Tai was seen today for hypertension. ? ?Diagnoses and all orders for this visit: ? ?Primary hypertension ?-     CMP14+EGFR ?-     hydrochlorothiazide (HYDRODIURIL) 25 MG tablet; Take 0.5 tablets (12.5 mg total) by mouth daily. ? ?Chronic bilateral low back pain without sciatica ?-     tiZANidine (ZANAFLEX) 4 MG tablet; Take 1 tablet (4 mg total) by mouth every 6 (six) hours as needed for muscle spasms. ?-     Ambulatory referral to Orthopedic Surgery ? ?Vocal cord polyp ?-     Ambulatory referral to ENT ? ?Need for hepatitis C screening test ?-     HCV Ab w Reflex to Quant PCR ? ? ? ?Patient has been counseled on age-appropriate routine health concerns for screening and prevention. These are reviewed and up-to-date. Referrals have been placed accordingly. Immunizations are up-to-date or declined.    ?Subjective:  ? ?Chief Complaint  ?Patient presents with  ? Hypertension  ? ?HPI ?Shirley Hansen 56 y.o. female presents to office today for follow up to HTN.  ?She has a past medical history of CAD, MI 2017, Hypertension, HPL, Pancreatitis, Stage 3a chronic kidney disease, and Stroke with left sided residual weakness (uses a wheelchair for mobility but she is not wheelchair bound, left rotator cuff repair ? ?She moved here from [place] last year.  Was a previous patient of [facility] however they stopped taking her insurance ?  ?She has a history of vocal cord polyp. Was being followed by ENT for this at [facility] and now needs a referral to local ENT. Notes changes in quality of voice with vocal hoarseness.  ? She was diagnosed by [facility] ENT Dr. 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 Ms. Montoya's voice is stable and the cyst has not enlarged. Would recommend no surgical intervention. She does not have voice complaints so no VOT indicated. Will repeat laryngoscopy in one year or sooner if she has new voice  concerns. ?2. Dysphagia worked up with MBS and barium swallow. She has seen GI as well. Recommend continued PT exercises to keep neck muscles relaxed as she felt this helped with swallowing. ?3. MD return in one year. ?  ?HTN ?Blood pressure is well somewhat controlled however diastolic is not at goal. She is currently taking carvedilol 25 mg BID, amlodipine 10 mg daily. Will resume HCTZ 12.5 mg daily.  ?BP Readings from Last 3 Encounters:  ?10/17/21 127/88  ?10/12/21 116/88  ?10/05/21 133/89  ?  ? ?Chronic low back pain ?Can not take methocarbamol as it caused GI upset.  She was contacted by pain management however was lost to follow up. Other medications tried include elavil, duloxetine, pregabalin, and tizanidine. She would like to restart tizanidine. Back pain is aggravated by prolonged sitting. She is not very active. She does have urge incontinence however this is not related to her back pain.  ? ? ?EYE PROBLEM ?She has a history of glaucoma. She was seeing Dr. Lyndel Safe for this. She has an appt next week for follow up. Endorses medication adherence with taking her  eye drops. States when she bends down and sits back up she has increased blurred vision in the left eye which can last from a few seconds to a few minutes and resolves on its own. ? ? ?Review of Systems  ?Constitutional:  Negative for fever, malaise/fatigue  and weight loss.  ?HENT: Negative.  Negative for nosebleeds. Tinnitus: SEE HPI.  ?Eyes: Negative.  Negative for blurred vision, double vision and photophobia.  ?Respiratory: Negative.  Negative for cough and shortness of breath.   ?Cardiovascular: Negative.  Negative for chest pain, palpitations and leg swelling.  ?Gastrointestinal: Negative.  Negative for heartburn, nausea and vomiting.  ?Musculoskeletal:  Positive for back pain and myalgias.  ?Neurological: Negative.  Negative for dizziness, focal weakness, seizures and headaches.  ?Psychiatric/Behavioral: Negative.  Negative for suicidal ideas.    ? ? ? ?Past Surgical History:  ?Procedure Laterality Date  ? TUBAL LIGATION    ? ? ?History reviewed. No pertinent family history. ? ?Social History Reviewed with no changes to be made today.  ? ?Outpatient Medications Prior to Visit  ?Medication Sig Dispense Refill  ? acetaminophen (TYLENOL) 325 MG tablet Take 650 mg by mouth every 6 (six) hours as needed for mild pain.    ? albuterol (VENTOLIN HFA) 108 (90 Base) MCG/ACT inhaler Inhale 2 puffs into the lungs every 2 (two) hours as needed for wheezing or shortness of breath.    ? amLODipine (NORVASC) 10 MG tablet Take 1 tablet (10 mg total) by mouth daily. 90 tablet 0  ? Artificial Tear Solution (GENTEAL TEARS OP) Place 1 drop into both eyes daily as needed (dry eyes).    ? atorvastatin (LIPITOR) 80 MG tablet Take 1 tablet (80 mg total) by mouth at bedtime. 90 tablet 3  ? brimonidine (ALPHAGAN) 0.2 % ophthalmic solution Place 1 drop into both eyes 2 (two) times daily.    ? carvedilol (COREG) 25 MG tablet Take 1 tablet (25 mg total) by mouth 2 (two) times daily. 180 tablet 1  ? diclofenac Sodium (VOLTAREN) 1 % GEL Apply 1 application topically 4 (four) times daily as needed (pain).    ? dorzolamide-timolol (COSOPT) 22.3-6.8 MG/ML ophthalmic solution Place 1 drop into both eyes 2 (two) times daily.    ? latanoprost (XALATAN) 0.005 % ophthalmic solution Place 1 drop into both eyes at bedtime.    ? Misc. Devices (EXTENDABLE BEDSIDE RAIL) MISC ICD 10 I69.354 1 each 0  ? Netarsudil Dimesylate 0.02 % SOLN Place 1 drop into both eyes at bedtime.    ? amitriptyline (ELAVIL) 25 MG tablet Take 1 tablet (25 mg total) by mouth at bedtime. 90 tablet 1  ? methocarbamol (ROBAXIN) 500 MG tablet TAKE 1 TABLET BY MOUTH EVERY 8 HOURS AS NEEDED FOR MUSCLE SPASM 60 tablet 0  ? famotidine (PEPCID) 20 MG tablet Take 20 mg by mouth 2 (two) times daily. (Patient not taking: Reported on 10/17/2021)    ? meclizine (ANTIVERT) 25 MG tablet Take 1 tablet (25 mg total) by mouth 3 (three) times  daily as needed for dizziness. (Patient not taking: Reported on 10/17/2021) 30 tablet 0  ? pregabalin (LYRICA) 75 MG capsule TAKE 1 CAPSULE (75 MG TOTAL) BY MOUTH 2 (TWO) TIMES DAILY. RESUME AFTER FEW DAYS 60 capsule 3  ? traMADol (ULTRAM) 50 MG tablet Take 1 tablet (50 mg total) by mouth 3 (three) times daily as needed. (Patient not taking: Reported on 10/17/2021) 30 tablet 2  ? hydrochlorothiazide (HYDRODIURIL) 25 MG tablet Take 0.5 tablets (12.5 mg total) by mouth daily. (Patient not taking: Reported on 10/17/2021) 45 tablet 1  ? ?No facility-administered medications prior to visit.  ? ? ?No Known Allergies ? ?   ?Objective:  ?  ?BP 127/88 (BP Location: Right Arm, Patient Position: Sitting, Cuff Size: Normal)     Pulse 86   Temp 98 ?F (36.7 ?C) (Oral)   Resp 16   Wt 193 lb (87.5 kg)   SpO2 96%   BMI 33.13 kg/m?  ?Wt Readings from Last 3 Encounters:  ?10/17/21 193 lb (87.5 kg)  ?05/03/21 187 lb (84.8 kg)  ?04/20/21 187 lb (84.8 kg)  ? ? ?Physical Exam ?Vitals and nursing note reviewed.  ?Constitutional:   ?   Appearance: She is well-developed.  ?HENT:  ?   Head: Normocephalic and atraumatic.  ?Cardiovascular:  ?   Rate and Rhythm: Normal rate and regular rhythm.  ?   Heart sounds: Normal heart sounds. No murmur heard. ?  No friction rub. No gallop.  ?Pulmonary:  ?   Effort: Pulmonary effort is normal. No tachypnea or respiratory distress.  ?   Breath sounds: Normal breath sounds. No decreased breath sounds, wheezing, rhonchi or rales.  ?Chest:  ?   Chest wall: No tenderness.  ?Abdominal:  ?   General: Bowel sounds are normal.  ?   Palpations: Abdomen is soft.  ?Musculoskeletal:     ?   General: Normal range of motion.  ?   Cervical back: Normal range of motion.  ?Skin: ?   General: Skin is warm and dry.  ?Neurological:  ?   Mental Status: She is alert and oriented to person, place, and time.  ?Psychiatric:     ?   Behavior: Behavior normal. Behavior is cooperative.     ?   Thought Content: Thought content normal.      ?   Judgment: Judgment normal.  ? ? ? ? ?   ?Patient has been counseled extensively about nutrition and exercise as well as the importance of adherence with medications and regular follow-up. The patient was

## 2021-10-17 NOTE — Progress Notes (Signed)
7/10 pain left shoulder and neck and spasms ?Left eye blurry- called eye MD, no return call.  ?

## 2021-10-17 NOTE — Patient Instructions (Addendum)
[facility] ph. # [phone]  ?[address]  ? ?[facility] ?[address] ?[address] ?[place], [place] [address] ?Phone [phone] ?Fax [phone] ?

## 2021-10-18 ENCOUNTER — Ambulatory Visit: Payer: Medicare Other | Admitting: Physical Therapy

## 2021-10-18 LAB — CMP14+EGFR
ALT: 22 IU/L (ref 0–32)
AST: 25 IU/L (ref 0–40)
Albumin/Globulin Ratio: 1.3 (ref 1.2–2.2)
Albumin: 4.3 g/dL (ref 3.8–4.9)
Alkaline Phosphatase: 116 IU/L (ref 44–121)
BUN/Creatinine Ratio: 8 — ABNORMAL LOW (ref 9–23)
BUN: 7 mg/dL (ref 6–24)
Bilirubin Total: 0.4 mg/dL (ref 0.0–1.2)
CO2: 28 mmol/L (ref 20–29)
Calcium: 10 mg/dL (ref 8.7–10.2)
Chloride: 102 mmol/L (ref 96–106)
Creatinine, Ser: 0.84 mg/dL (ref 0.57–1.00)
Globulin, Total: 3.3 g/dL (ref 1.5–4.5)
Glucose: 115 mg/dL — ABNORMAL HIGH (ref 70–99)
Potassium: 4 mmol/L (ref 3.5–5.2)
Sodium: 144 mmol/L (ref 134–144)
Total Protein: 7.6 g/dL (ref 6.0–8.5)
eGFR: 82 mL/min/{1.73_m2} (ref >59)

## 2021-10-18 LAB — HCV INTERPRETATION

## 2021-10-18 LAB — HCV AB W REFLEX TO QUANT PCR: HCV Ab: NONREACTIVE

## 2021-10-19 ENCOUNTER — Ambulatory Visit: Payer: Medicare Other | Admitting: Occupational Therapy

## 2021-10-19 VITALS — BP 108/73 | HR 85

## 2021-10-19 DIAGNOSIS — R2689 Other abnormalities of gait and mobility: Secondary | ICD-10-CM | POA: Diagnosis not present

## 2021-10-19 DIAGNOSIS — R2681 Unsteadiness on feet: Secondary | ICD-10-CM

## 2021-10-19 DIAGNOSIS — I69854 Hemiplegia and hemiparesis following other cerebrovascular disease affecting left non-dominant side: Secondary | ICD-10-CM | POA: Diagnosis not present

## 2021-10-19 DIAGNOSIS — M6281 Muscle weakness (generalized): Secondary | ICD-10-CM | POA: Diagnosis not present

## 2021-10-19 DIAGNOSIS — R208 Other disturbances of skin sensation: Secondary | ICD-10-CM

## 2021-10-19 DIAGNOSIS — R278 Other lack of coordination: Secondary | ICD-10-CM | POA: Diagnosis not present

## 2021-10-19 DIAGNOSIS — R262 Difficulty in walking, not elsewhere classified: Secondary | ICD-10-CM | POA: Diagnosis not present

## 2021-10-19 NOTE — Therapy (Signed)
?OUTPATIENT OCCUPATIONAL THERAPY TREATMENT NOTE ? ? ?Patient Name: Shirley Hansen ?MRN: [id number] ?DOB:1965-10-17, 56 y.o., female ?Today's Date: 10/19/2021 ? ?PCP: Gildardo Pounds, NP ?REFERRING PROVIDER: Gildardo Pounds, NP ? ?END OF SESSION:  ? OT End of Session - 10/19/21 1632   ? ? Visit Number 7   ? Number of Visits 17   ? Date for OT Re-Evaluation 11/24/21   ? Authorization Type UHC Medicaid (pt reports Medicare will activate May 1st)   ? Authorization Time Period $4 copay VL: 27 combined   ? OT Start Time 6387   ? OT Stop Time 1530   ? OT Time Calculation (min) 45 min   ? Activity Tolerance Other (comment)   general malaise/stiffness  ? ?  ?  ? ?  ? ? ? ?Past Medical History:  ?Diagnosis Date  ? Hypertension   ? Pancreatitis   ? Stage 3a chronic kidney disease ([place])   ? Stroke [facility])   ? ?Past Surgical History:  ?Procedure Laterality Date  ? TUBAL LIGATION    ? ?Patient Active Problem List  ? Diagnosis Date Noted  ? Acute encephalopathy 04/09/2021  ? Hypertensive urgency 04/09/2021  ? Hemiparesis affecting left side as late effect of cerebrovascular accident (CVA) ([place]) 04/07/2021  ? Muscle spasticity 04/07/2021  ? History of stroke 03/23/2021  ? Chronic left shoulder pain 03/23/2021  ? Depression 03/23/2021  ? Glaucoma of both eyes 03/23/2021  ? Dizziness 03/13/2021  ? Orthostatic hypotension 03/13/2021  ? Weakness 03/05/2021  ? COVID-19 07/14/2020  ? Stroke [facility])   ? Stage 3a chronic kidney disease ([place])   ? Hypertension   ? ? ?ONSET DATE:  Patient had a stroke in Nov 2020, referral 09/08/2021   ? ?REFERRING DIAG: R29.898 (ICD-10-CM) - LUE weakness   ? ?THERAPY DIAG:  ?Hemiplegia and hemiparesis following other cerebrovascular disease affecting left non-dominant side ([place]) ? ?Unsteadiness on feet ? ?Muscle weakness (generalized) ? ?Other lack of coordination ? ?Other disturbances of skin sensation ? ? ?PERTINENT HISTORY: HTN, Pancreatisis, CVA, Stage III CKD  ? ?PRECAUTIONS: fall ? ?SUBJECTIVE:  Pt is  reporting discomfort and disturbances with vision in Lt eye - history of glaucoma. ? ?PAIN:  ?Are you having pain? Yes: NPRS scale: 7/10 ?Pain location: LUE neck and distal ?Pain description: tightness ?Aggravating factors: moving it ?Relieving factors: rest ? ? ? ?OBJECTIVE:  ? ?TODAY'S TREATMENT: ? ?10/19/21: ?Patient with increased stiffness (co-activation) throughout left trunk and limbs.  Patient with report of neck pain, and shoulder/arm stiffness.  Patient continues with poorly graded movements and this leads to pain.  Patient shown theracane, and allowed to practice.  Patient given resource if she would choose to purchase.   ?Worked on trunk rotation, shifting weight toward and away from left side in closed chain and encouraging body on extended left arm movement.  Pain slightly better at end of session @ 5/10 ? ? ?10/12/21: ?Supine:  Working on body on arm motion and stretching shoulder with elbow extension.  Patient able to roll up onto Left arm today and actively work on scapular stabilizers in sidesitting.   ?Scapular mobilization followed by active scapular motion in all planes, then with resistance.   ? ?Checked short term goals:  Patient has not met grip strength or coordination goal.  Patient with increased ataxia with all attempts to move LUE faster.  Did much better with cue to keep part of UE on surface.   ? ? ? ? ? ?Vitals:  ?  10/19/21 1454  ?BP: 108/73  ?Pulse: 85  ? ? ? ?  ?  ?HOME EXERCISE PROGRAM: ?  ?09/26/21 Table Slides, flipping cards ?  ?  ?  ?GOALS: ?Goals reviewed with patient? No ?  ?SHORT TERM GOALS: Target date: 10/11/2021 ?  ?Pt will be independent with HEP. ?Baseline: not issued at evaluation ?Goal status: Achieved ?  ?2.  Pt will increase box and blocks score by 4 blocks or greater with LUE. ?Baseline: Right 56 blocks, Left 15 blocks ?5/3 - 16 Blocks - increased ataxia with speed ?Goal status: Ongoing ?  ?3.  Pt will improve grip strength by 5 lbs or greater with LUE. ?Baseline:  Right: 70.3 lbs; Left: 34.1 lbs  ?5/3- 30.1 lb LUE ?Goal status: Ongoing ?  ?4.  Pt will verbalize understanding of adapted strategies and/or equipment PRN to increase safety and independence with ADLs and IADLs (I.e. chopping and peeling for meal prep) ?Baseline: difficulty with chopping vegetables and peeling ?Goal status: Ongoing - 5/3 - reports attempting to peel a mango - but unable to stabilize mango in left hand - slipped out ?  ?  ?LONG TERM GOALS: Target date: 11/24/21 ?  ?Pt will be independent with updated HEP. ?Baseline: not issued at evaluation ?Goal status: Ongoing ?  ?2.  Pt will increase fine motor coordination in LUE by improving 9 hole peg test by achieving 5 pegs in 60 seconds or less. ?Baseline: Right: 23.82 sec; Left: 0 pegs in 60 sec ?Goal status: Ongoing ?  ?3.  Pt will increase box and blocks score by 10 blocks or greater with LUE. ?Baseline: Right 56 blocks, Left 15 blocks ?Goal status: Ongoing ?  ?4.  Pt will improve grip strength by 10 lbs or greater with LUE ?Baseline: Right: 70.3 lbs; Left: 34.1 lbs ?Goal status: Ongoing ? ?5.  Pt will perform all basic ADLs with mod I ?Baseline: continues to need some help with grooming and fasteners ?Goal status: Ongoing ?  ?ASSESSMENT: ?  ?CLINICAL IMPRESSION: ?Patient less energetic this session - reporting vision difficulties, swelling and stiffness.  BP therapeutic see Vital Signs.  Patient has not yet filled prescriptions to address edema and muscle tension, encouraged her to do so.  Patient has follow up appointment for eyes.  Pt continues with significant tension in LUE shoulder although reports improvement in pain - ataxia / muscle imbalance impeding overall participation in therapeutic sessions/interventions. ?  ?  ?PLAN: ?OT FREQUENCY: 2x/week ?  ?OT DURATION: 10 weeks 16 visits over 10 weeks d/t any scheduling conflicts ?  ?PLANNED INTERVENTIONS: self care/ADL training, therapeutic exercise, therapeutic activity, neuromuscular re-education,  manual therapy, passive range of motion, functional mobility training, aquatic therapy, splinting, electrical stimulation, ultrasound, fluidotherapy, moist heat, cryotherapy, patient/family education, visual/perceptual remediation/compensation, and DME and/or AE instructions ?  ?PLAN FOR NEXT SESSION:  Scapular mobilization, guided movement, or partially closed chain movement to reduce ataxia, body on arm motion - weight shift left - trunk movement to reduce overall stiffness ? ? ? ? ?Mariah Milling, OT ?10/19/2021, 4:34 PM ? ?  ? ?  ?

## 2021-10-20 ENCOUNTER — Encounter: Payer: Self-pay | Admitting: Physical Therapy

## 2021-10-20 ENCOUNTER — Ambulatory Visit: Payer: Medicare Other | Admitting: Physical Therapy

## 2021-10-20 ENCOUNTER — Telehealth: Payer: Self-pay | Admitting: Nurse Practitioner

## 2021-10-20 ENCOUNTER — Other Ambulatory Visit: Payer: Self-pay | Admitting: Nurse Practitioner

## 2021-10-20 DIAGNOSIS — G8929 Other chronic pain: Secondary | ICD-10-CM

## 2021-10-20 DIAGNOSIS — R2689 Other abnormalities of gait and mobility: Secondary | ICD-10-CM

## 2021-10-20 DIAGNOSIS — R262 Difficulty in walking, not elsewhere classified: Secondary | ICD-10-CM

## 2021-10-20 DIAGNOSIS — I69854 Hemiplegia and hemiparesis following other cerebrovascular disease affecting left non-dominant side: Secondary | ICD-10-CM | POA: Diagnosis not present

## 2021-10-20 DIAGNOSIS — R278 Other lack of coordination: Secondary | ICD-10-CM | POA: Diagnosis not present

## 2021-10-20 DIAGNOSIS — R2681 Unsteadiness on feet: Secondary | ICD-10-CM | POA: Diagnosis not present

## 2021-10-20 DIAGNOSIS — R208 Other disturbances of skin sensation: Secondary | ICD-10-CM | POA: Diagnosis not present

## 2021-10-20 DIAGNOSIS — M6281 Muscle weakness (generalized): Secondary | ICD-10-CM | POA: Diagnosis not present

## 2021-10-20 DIAGNOSIS — I10 Essential (primary) hypertension: Secondary | ICD-10-CM

## 2021-10-20 MED ORDER — HYDROCHLOROTHIAZIDE 25 MG PO TABS: 12.5000 mg | ORAL_TABLET | Freq: Every day | ORAL | 1 refills | Status: DC

## 2021-10-20 MED ORDER — ATORVASTATIN CALCIUM 80 MG PO TABS: 80.0000 mg | ORAL_TABLET | Freq: Every day | ORAL | 1 refills | Status: AC

## 2021-10-20 MED ORDER — CARVEDILOL 25 MG PO TABS: 25.0000 mg | ORAL_TABLET | Freq: Two times a day (BID) | ORAL | 1 refills | Status: AC

## 2021-10-20 MED ORDER — TIZANIDINE HCL 4 MG PO TABS: 4.0000 mg | ORAL_TABLET | Freq: Four times a day (QID) | ORAL | 2 refills | Status: DC | PRN

## 2021-10-20 MED ORDER — AMLODIPINE BESYLATE 10 MG PO TABS: 10.0000 mg | ORAL_TABLET | Freq: Every day | ORAL | 1 refills | Status: DC

## 2021-10-20 NOTE — Therapy (Signed)
?OUTPATIENT PHYSICAL THERAPY TREATMENT NOTE ? ? ?Patient Name: Shirley Hansen ?MRN: [id number] ?DOB:04-23-1966, 56 y.o., female ?Today's Date: 10/21/2021 ? ?PCP: Gildardo Pounds, NP ?REFERRING PROVIDER: Gildardo Pounds, NP ? ? PT End of Session - 10/20/21 1322   ? ? Visit Number 9   ? Number of Visits 17   ? Date for PT Re-Evaluation 11/11/21   pushed out due to scheduling  ? Authorization Type UHC Medicaid; VL: 27   ? PT Start Time 1318   ? PT Stop Time 1400   ? PT Time Calculation (min) 42 min   ? Equipment Utilized During Treatment Gait belt;Other (comment)   hand orthotic  ? Activity Tolerance Patient tolerated treatment well   ? Behavior During Therapy [facility] for tasks assessed/performed   ? ?  ?  ? ?  ? ? ? ?Past Medical History:  ?Diagnosis Date  ? Hypertension   ? Pancreatitis   ? Stage 3a chronic kidney disease ([place])   ? Stroke [facility])   ? ?Past Surgical History:  ?Procedure Laterality Date  ? TUBAL LIGATION    ? ?Patient Active Problem List  ? Diagnosis Date Noted  ? Acute encephalopathy 04/09/2021  ? Hypertensive urgency 04/09/2021  ? Hemiparesis affecting left side as late effect of cerebrovascular accident (CVA) ([place]) 04/07/2021  ? Muscle spasticity 04/07/2021  ? History of stroke 03/23/2021  ? Chronic left shoulder pain 03/23/2021  ? Depression 03/23/2021  ? Glaucoma of both eyes 03/23/2021  ? Dizziness 03/13/2021  ? Orthostatic hypotension 03/13/2021  ? Weakness 03/05/2021  ? COVID-19 07/14/2020  ? Stroke [facility])   ? Stage 3a chronic kidney disease ([place])   ? Hypertension   ? ? ?REFERRING DIAG: I69.354 (ICD-10-CM) - Hemiparesis affecting left side as late effect of cerebrovascular accident (CVA) ([place]) ? ?THERAPY DIAG:  ?Unsteadiness on feet ? ?Muscle weakness (generalized) ? ?Other abnormalities of gait and mobility ? ?Difficulty in walking, not elsewhere classified ? ?PERTINENT HISTORY: HTN, Pancreatisis, CVA, Stage III CKD ? ?PRECAUTIONS: Fall ? ?SUBJECTIVE:  Continues with blurry vision in left eye, see's the  eye doctor on Monday coming up. No falls. Has not used the hand orthotic at home as she thinks her daughter may have accidentally thrown it away.  ? ?PAIN:  ?Are you having pain? No; Reports just stiffness ?  ?There were no vitals filed for this visit. ? ? ?There is no height or weight on file to calculate BMI. ? ? ?TODAY'S TREATMENT:   ? 10/20/2021: ?GAIT: ?Gait pattern: step through pattern, decreased step length- Right, decreased stance time- Left, decreased hip/knee flexion- Left, decreased ankle dorsiflexion- Left, Left foot flat, and narrow BOS ?Distance walked: 345 x 1, plus around clinic with session ?Assistive device utilized: Environmental consultant - 2 wheeled and left hand orthotic ?Level of assistance:  supervision  ?Comments: no imbalance noted with left toe scuffing with swing phase noted at times. No issues with hand falling off with use of hand orthotic. BP after gait 128/78, HR 87 ? ? ?STRENGTHENING  ?Scifit level 4.5  x 8 minutes with goal >/= 60 steps per minute for strengthening and activity tolerance.  ? ?   ?NMR/BALANCE: ?Side Stepping: on blue mat in pareallel bars with light UE support for 3 laps each left<>right, increased time needed with side stepping toward left side. Min guard to min assist for balance.     ? ? ? ? ?PATIENT EDUCATION: ?Education details: continue with current HEP ?Person educated: Patient ?Education method: Explanation ?Education  comprehension: verbalized understanding ?  ?  ?HOME EXERCISE PROGRAM: ?Access Code: [email or web address] ?URL: [email or web address] ?Date: 09/12/2021 ?Prepared by: Elease Etienne ? ? ?GOALS: ?Goals reviewed with patient? Yes ?  ?SHORT TERM GOALS: Target date: 10/07/2021 ?  ?Pt will be independent with initial HEP for improved strengthening and balance  ?Baseline: No HEP established; reports independence with current HEP ?Goal status: MET ?  ?2.  Pt will improve gait speed to >/= 0.75 ft/sec to demonstrate improved community ambulation ?Baseline: 0.46  ft/sec; 0.93 ft/sec  ?Goal status: MET ?  ?3.  Pt will improve 5x STS to </= 45 sec to demo improved functional LE strength and balance  ?Baseline: 51.81; 35. 68 secs  ?Goal status: MET ?  ?4.  Pt will increase BERG balance score to 30/56 to demonstrate improved static balance. ?Baseline: 27/56; 34/56 ?Goal status: MET ?  ?5.  Pt will demonstrate TUG of <45 seconds in order to decrease risk of falls and improve functional mobility using LRAD. ?Baseline: 50.52sec; 34.29 secs ?Goal status: MET ?  ?6.  Pt will be able to ambulate >/= 300 ft with LRAD and CGA to demo improved household mobility ?Baseline: 107 ft with RW and CGA; 245 ft with CGA and RW ?Goal status: INITIAL ?  ?LONG TERM GOALS: Target date: 11/04/2021 ?  ?Pt will be independent with final progressive HEP for improved strengthening and balance  ?Baseline: No HEP established ?Goal status: INITIAL ?  ?2.  Pt will improve gait speed to >/= 1.0 ft/sec to demonstrate improved community ambulation ?Baseline: 0.46 ft/sec ?Goal status: INITIAL ?  ?3.  Pt will improve 5x STS to </= 35 sec to demo improved functional LE strength and balance  ?Baseline: 51.81 secs ?Goal status: INITIAL ?  ?4.  Pt will increase BERG balance score to 33/56 to demonstrate improved static balance. ?Baseline: TBA ?Goal status: INITIAL ?  ?5.  Pt will demonstrate TUG of <40 seconds in order to decrease risk of falls and improve functional mobility using LRAD. ?Baseline: 50.52sec ?Goal status: INITIAL ?  ?6.  Pt will be able to ambulate >/= 500 ft with LRAD and supervison to demo improved household mobility ?Baseline: 107 ft with CGA and RW ?Goal status: INITIAL ?  ?ASSESSMENT: ?  ?CLINICAL IMPRESSION: ?Today's skilled session focused on continued gait training, strengthening and balance training on compliant surfaces with no issues noted or reported in session. The pt is making steady progress and should benefit from continued PT to progress toward unmet goals.  ?  ?  ?  ?OBJECTIVE  IMPAIRMENTS Abnormal gait, decreased activity tolerance, decreased balance, decreased coordination, decreased endurance, decreased knowledge of use of DME, decreased mobility, difficulty walking, decreased strength, decreased safety awareness, increased edema, impaired sensation, impaired tone, postural dysfunction, and pain.  ?  ?ACTIVITY LIMITATIONS cleaning, meal prep, laundry, and medication management.  ?  ?PERSONAL FACTORS Time since onset of injury/illness/exacerbation and 3+ comorbidities: HTN, Pancreatisis, CVA, Stage III CKD  are also affecting patient's functional outcome.  ?  ?  ?REHAB POTENTIAL: Good ?  ?CLINICAL DECISION MAKING: Evolving/moderate complexity ?  ?EVALUATION COMPLEXITY: Moderate ?  ?PLAN: ?PT FREQUENCY: 2x/week ?  ?PT DURATION: 8 weeks (may need to decreased duration based upon OT and Pelvic Floor Needs ?  ?PLANNED INTERVENTIONS: Therapeutic exercises, Therapeutic activity, Neuromuscular re-education, Balance training, Gait training, Patient/Family education, Joint manipulation, Joint mobilization, Stair training, Vestibular training, Orthotic/Fit training, DME instructions, Aquatic Therapy, Cryotherapy, and Moist heat ?  ?PLAN FOR NEXT SESSION:  ?Continue use of  hand orthotic.  continue with LE strengthening and stance stability with emphasis on hip/knee flexion. Work on both Research scientist (physical sciences).  ?  ? ? ?Willow Ora, PTA, CLT ?[facility] ?[address], [address] ?[place], [place] [address] ?[phone] ?10/21/21, 9:00 AM  ? ?

## 2021-10-20 NOTE — Telephone Encounter (Signed)
All medications prescribed by me were sent electronically to [facility] 10-20-2021. Any other requests would need to be sent by original prescriber.  ?

## 2021-10-20 NOTE — Telephone Encounter (Unsigned)
Copied from CRM [id number]. Topic: General - Other ?>> Oct 19, 2021  5:52 PM Randol Kern wrote: ?Reason for CRM: Lynnea Ferrier from [facility] called to confirm if fax was received from May 1st and May 8th, please advise. He is going to refax the document.  ? ?Best contact: [phone] direct  ? ?Pharmacy: [phone] ?

## 2021-10-24 ENCOUNTER — Encounter: Payer: Medicaid Other | Admitting: Occupational Therapy

## 2021-10-24 ENCOUNTER — Ambulatory Visit: Payer: Medicaid Other | Admitting: Physical Therapy

## 2021-10-24 ENCOUNTER — Other Ambulatory Visit: Payer: Self-pay | Admitting: Neurology

## 2021-10-24 DIAGNOSIS — H2513 Age-related nuclear cataract, bilateral: Secondary | ICD-10-CM | POA: Diagnosis not present

## 2021-10-24 DIAGNOSIS — I69354 Hemiplegia and hemiparesis following cerebral infarction affecting left non-dominant side: Secondary | ICD-10-CM

## 2021-10-24 DIAGNOSIS — H401133 Primary open-angle glaucoma, bilateral, severe stage: Secondary | ICD-10-CM | POA: Diagnosis not present

## 2021-10-24 DIAGNOSIS — M62838 Other muscle spasm: Secondary | ICD-10-CM

## 2021-10-24 NOTE — Telephone Encounter (Signed)
Verify Drug Registry For Pregabalin 75 Mg Capsule ?Last Filled: 09/21/2021 ?Quantity: 60 capsules for 30 days ?Last appointment: 05/03/2021 ?Next appointment: 05/03/2022 ?

## 2021-10-25 ENCOUNTER — Ambulatory Visit: Payer: Medicare Other | Admitting: Orthopaedic Surgery

## 2021-10-26 ENCOUNTER — Encounter: Payer: Self-pay | Admitting: Occupational Therapy

## 2021-10-26 ENCOUNTER — Encounter: Payer: Self-pay | Admitting: Physical Therapy

## 2021-10-26 ENCOUNTER — Ambulatory Visit: Payer: Medicare Other | Admitting: Occupational Therapy

## 2021-10-26 ENCOUNTER — Ambulatory Visit: Payer: Medicare Other | Admitting: Physical Therapy

## 2021-10-26 VITALS — BP 120/78 | HR 86

## 2021-10-26 DIAGNOSIS — R2689 Other abnormalities of gait and mobility: Secondary | ICD-10-CM | POA: Diagnosis not present

## 2021-10-26 DIAGNOSIS — R2681 Unsteadiness on feet: Secondary | ICD-10-CM

## 2021-10-26 DIAGNOSIS — M6281 Muscle weakness (generalized): Secondary | ICD-10-CM

## 2021-10-26 DIAGNOSIS — R278 Other lack of coordination: Secondary | ICD-10-CM | POA: Diagnosis not present

## 2021-10-26 DIAGNOSIS — R262 Difficulty in walking, not elsewhere classified: Secondary | ICD-10-CM | POA: Diagnosis not present

## 2021-10-26 DIAGNOSIS — I69854 Hemiplegia and hemiparesis following other cerebrovascular disease affecting left non-dominant side: Secondary | ICD-10-CM

## 2021-10-26 DIAGNOSIS — R208 Other disturbances of skin sensation: Secondary | ICD-10-CM | POA: Diagnosis not present

## 2021-10-26 NOTE — Therapy (Signed)
?OUTPATIENT OCCUPATIONAL THERAPY TREATMENT NOTE ? ? ?Patient Name: Shirley Hansen ?MRN: [id number] ?DOB:[birth year], 56 y.o., female ?Today's Date: 10/26/2021 ? ?PCP: Gildardo Pounds, NP ?REFERRING PROVIDER: Gildardo Pounds, NP ? ?END OF SESSION:  ? OT End of Session - 10/26/21 1402   ? ? Visit Number 8   ? Number of Visits 17   ? Date for OT Re-Evaluation 11/24/21   ? Authorization Type UHC Medicaid (pt reports Medicare will activate May 1st)   ? Authorization Time Period $4 copay VL: 27 combined   ? OT Start Time 1402   ? OT Stop Time [id number]   ? OT Time Calculation (min) 43 min   ? Activity Tolerance Other (comment)   general malaise/stiffness  ? Behavior During Therapy [facility] for tasks assessed/performed   ? ?  ?  ? ?  ? ? ? ?Past Medical History:  ?Diagnosis Date  ? Hypertension   ? Pancreatitis   ? Stage 3a chronic kidney disease ([place])   ? Stroke [facility])   ? ?Past Surgical History:  ?Procedure Laterality Date  ? TUBAL LIGATION    ? ?Patient Active Problem List  ? Diagnosis Date Noted  ? Acute encephalopathy 04/09/2021  ? Hypertensive urgency 04/09/2021  ? Hemiparesis affecting left side as late effect of cerebrovascular accident (CVA) ([place]) 04/07/2021  ? Muscle spasticity 04/07/2021  ? History of stroke 03/23/2021  ? Chronic left shoulder pain 03/23/2021  ? Depression 03/23/2021  ? Glaucoma of both eyes 03/23/2021  ? Dizziness 03/13/2021  ? Orthostatic hypotension 03/13/2021  ? Weakness 03/05/2021  ? COVID-19 07/14/2020  ? Stroke [facility])   ? Stage 3a chronic kidney disease ([place])   ? Hypertension   ? ? ?ONSET DATE:  Patient had a stroke in Nov 2020, referral 09/08/2021   ? ?REFERRING DIAG: R29.898 (ICD-10-CM) - LUE weakness   ? ?THERAPY DIAG:  ?Unsteadiness on feet ? ?Muscle weakness (generalized) ? ?Other abnormalities of gait and mobility ? ?Hemiplegia and hemiparesis following other cerebrovascular disease affecting left non-dominant side ([place]) ? ?Other lack of coordination ? ?Other disturbances of skin  sensation ? ? ?PERTINENT HISTORY: HTN, Pancreatisis, CVA, Stage III CKD  ? ?PRECAUTIONS: fall ? ?SUBJECTIVE:  Pt continues to report increased pain and tightness/cramping in LUE even with new medication. ? ?PAIN:  ?Are you having pain? Yes: NPRS scale: 7/10 ?Pain location: LUE neck and distal ?Pain description: tightness ?Aggravating factors: moving it ?Relieving factors: rest ? ? ? ?OBJECTIVE:  ? ?TODAY'S TREATMENT: ? ?10/26/21 ?Pt went to eye doctor and MD reported no concern for pressure/glaucoma or cataracts. Pt reports MD reports it is from the stroke.  ? ?Pt reports continued tightness and discomfort in LUE shoulder even with new medication - encouraged to take as prescribed and consistently. ? ?Ultrasound 0.8w/cm2, 1 mhz, 8 minutes to LUE shoulder girdle, 5 minutes to upper trap Lt, pulsed 20% duty cycle. ? ?Gaze Stabilization and tested tracking, saccades and pursuits. Pt with some delayed movements with Lt eye,  ? ? ? ? ? ? ?There were no vitals filed for this visit. ? ? ? ?  ?  ?HOME EXERCISE PROGRAM: ?  ?09/26/21 Table Slides, flipping cards ?  ?  ?  ?GOALS: ?Goals reviewed with patient? No ?  ?SHORT TERM GOALS: Target date: 10/11/2021 ?  ?Pt will be independent with HEP. ?Baseline: not issued at evaluation ?Goal status: Achieved ?  ?2.  Pt will increase box and blocks score by 4 blocks or greater with LUE. ?Baseline:  Right 56 blocks, Left 15 blocks ?5/3 - 16 Blocks - increased ataxia with speed ?Goal status: Ongoing ?  ?3.  Pt will improve grip strength by 5 lbs or greater with LUE. ?Baseline: Right: 70.3 lbs; Left: 34.1 lbs  ?5/3- 30.1 lb LUE ?Goal status: Ongoing ?  ?4.  Pt will verbalize understanding of adapted strategies and/or equipment PRN to increase safety and independence with ADLs and IADLs (I.e. chopping and peeling for meal prep) ?Baseline: difficulty with chopping vegetables and peeling ?Goal status: Ongoing - 5/3 - reports attempting to peel a mango - but unable to stabilize mango in left hand  - slipped out ?  ?  ?LONG TERM GOALS: Target date: 11/24/21 ?  ?Pt will be independent with updated HEP. ?Baseline: not issued at evaluation ?Goal status: Ongoing ?  ?2.  Pt will increase fine motor coordination in LUE by improving 9 hole peg test by achieving 5 pegs in 60 seconds or less. ?Baseline: Right: 23.82 sec; Left: 0 pegs in 60 sec ?Goal status: Ongoing ?  ?3.  Pt will increase box and blocks score by 10 blocks or greater with LUE. ?Baseline: Right 56 blocks, Left 15 blocks ?Goal status: Ongoing ?  ?4.  Pt will improve grip strength by 10 lbs or greater with LUE ?Baseline: Right: 70.3 lbs; Left: 34.1 lbs ?Goal status: Ongoing ? ?5.  Pt will perform all basic ADLs with mod I ?Baseline: continues to need some help with grooming and fasteners ?Goal status: Ongoing ?  ?ASSESSMENT: ?  ?CLINICAL IMPRESSION: ?Pt with significant tension and increased tone and tightness in LUE.  ?  ?  ?PLAN: ?OT FREQUENCY: 2x/week ?  ?OT DURATION: 10 weeks 16 visits over 10 weeks d/t any scheduling conflicts ?  ?PLANNED INTERVENTIONS: self care/ADL training, therapeutic exercise, therapeutic activity, neuromuscular re-education, manual therapy, passive range of motion, functional mobility training, aquatic therapy, splinting, electrical stimulation, ultrasound, fluidotherapy, moist heat, cryotherapy, patient/family education, visual/perceptual remediation/compensation, and DME and/or AE instructions ?  ?PLAN FOR NEXT SESSION:  consider kinesiotape to LUE trap, scap mobilization, positioning and posture ? ? ? ? ?Zachery Conch, OT ?10/26/2021, 2:38 PM ? ?  ? ?  ?

## 2021-10-26 NOTE — Therapy (Addendum)
?OUTPATIENT PHYSICAL THERAPY TREATMENT NOTE ? ? ?Patient Name: Shirley Hansen ?MRN: [id number] ?DOB:Feb 18, 1966, 56 y.o., female ?Today's Date: 10/26/2021 ? ?PCP: Gildardo Pounds, NP ?REFERRING PROVIDER: Gildardo Pounds, NP ? ? PT End of Session - 10/26/21 1448   ? ? Visit Number 10   ? Number of Visits 17   ? Date for PT Re-Evaluation 11/11/21   pushed out due to scheduling  ? Authorization Type UHC Medicaid; VL: 27   ? PT Start Time 9476   ? PT Stop Time 5465   ? PT Time Calculation (min) 43 min   ? Equipment Utilized During Treatment Gait belt;Other (comment)   hand orthotic  ? Activity Tolerance Patient tolerated treatment well   ? Behavior During Therapy [facility] for tasks assessed/performed   ? ?  ?  ? ?  ? ? ? ?Past Medical History:  ?Diagnosis Date  ? Hypertension   ? Pancreatitis   ? Stage 3a chronic kidney disease ([place])   ? Stroke [facility])   ? ?Past Surgical History:  ?Procedure Laterality Date  ? TUBAL LIGATION    ? ?Patient Active Problem List  ? Diagnosis Date Noted  ? Acute encephalopathy 04/09/2021  ? Hypertensive urgency 04/09/2021  ? Hemiparesis affecting left side as late effect of cerebrovascular accident (CVA) ([place]) 04/07/2021  ? Muscle spasticity 04/07/2021  ? History of stroke 03/23/2021  ? Chronic left shoulder pain 03/23/2021  ? Depression 03/23/2021  ? Glaucoma of both eyes 03/23/2021  ? Dizziness 03/13/2021  ? Orthostatic hypotension 03/13/2021  ? Weakness 03/05/2021  ? COVID-19 07/14/2020  ? Stroke [facility])   ? Stage 3a chronic kidney disease ([place])   ? Hypertension   ? ? ?REFERRING DIAG: I69.354 (ICD-10-CM) - Hemiparesis affecting left side as late effect of cerebrovascular accident (CVA) ([place]) ? ?THERAPY DIAG:  ?Unsteadiness on feet ? ?Muscle weakness (generalized) ? ?Other abnormalities of gait and mobility ? ?Hemiplegia and hemiparesis following other cerebrovascular disease affecting left non-dominant side ([place]) ? ?PERTINENT HISTORY: HTN, Pancreatisis, CVA, Stage III CKD ? ?PRECAUTIONS:  Fall ? ?SUBJECTIVE:  Pt requests to take herself to restroom at onset of session.  Pt returns to mat table at 2:56pm.  Reports she is still having increased stiffness.  Pt states daughter threw hand orthotic in trash by accident. ? ?PAIN:  ?Are you having pain? No; Reports stiffness ?  ?Today's Vitals  ? 10/26/21 1445  ?BP: 120/78  ?Pulse: 86  ? ? ?TODAY'S TREATMENT:   ?GAIT: ?Gait pattern: step through pattern, decreased step length- Right, decreased stance time- Left, decreased hip/knee flexion- Left, decreased ankle dorsiflexion- Left, Left foot flat, and narrow BOS ?Distance walked: 40' + 114' ?Assistive device utilized: Environmental consultant - 2 wheeled and left hand orthotic ?Level of assistance:  supervision  ?Comments: Pt having intermittent tingling sensations in hand when using orthotic but does not feel the orthotic is causing them.  She demonstrates increased grip on orthotic.  She requires extended rest following gait due to reported tightness in LLE.  Pt requires min cues for RW management when approaching surfaces to sit w/ instruction to manage orthotic strap to encourage increased independence with AD management. ? ?Pt interrupted by phone call/FaceTime during session between bouts of gait.  PT allowed time for pt to complete. ?   ? ? ? ? ? ?PATIENT EDUCATION: ?Education details: Provided information and resource on purchase of walker splint to replace one daughter threw away. ?Person educated: Patient ?Education method: Explanation; handout ?Education comprehension: verbalized understanding ?  ?  ?  HOME EXERCISE PROGRAM: ?Access Code: [email or web address] ?URL: [email or web address] ?Date: 09/12/2021 ?Prepared by: Elease Etienne ? ? ?GOALS: ?Goals reviewed with patient? Yes ?  ?SHORT TERM GOALS: Target date: 10/07/2021 ?  ?Pt will be independent with initial HEP for improved strengthening and balance  ?Baseline: No HEP established; reports independence with current HEP ?Goal status: MET ?  ?2.  Pt will  improve gait speed to >/= 0.75 ft/sec to demonstrate improved community ambulation ?Baseline: 0.46 ft/sec; 0.93 ft/sec  ?Goal status: MET ?  ?3.  Pt will improve 5x STS to </= 45 sec to demo improved functional LE strength and balance  ?Baseline: 51.81; 35. 68 secs  ?Goal status: MET ?  ?4.  Pt will increase BERG balance score to 30/56 to demonstrate improved static balance. ?Baseline: 27/56; 34/56 ?Goal status: MET ?  ?5.  Pt will demonstrate TUG of <45 seconds in order to decrease risk of falls and improve functional mobility using LRAD. ?Baseline: 50.52sec; 34.29 secs ?Goal status: MET ?  ?6.  Pt will be able to ambulate >/= 300 ft with LRAD and CGA to demo improved household mobility ?Baseline: 107 ft with RW and CGA; 245 ft with CGA and RW ?Goal status: INITIAL ?  ?LONG TERM GOALS: Target date: 11/04/2021 ?  ?Pt will be independent with final progressive HEP for improved strengthening and balance  ?Baseline: No HEP established ?Goal status: INITIAL ?  ?2.  Pt will improve gait speed to >/= 1.0 ft/sec to demonstrate improved community ambulation ?Baseline: 0.46 ft/sec ?Goal status: INITIAL ?  ?3.  Pt will improve 5x STS to </= 35 sec to demo improved functional LE strength and balance  ?Baseline: 51.81 secs ?Goal status: INITIAL ?  ?4.  Pt will increase BERG balance score to 33/56 to demonstrate improved static balance. ?Baseline: TBA ?Goal status: INITIAL ?  ?5.  Pt will demonstrate TUG of <40 seconds in order to decrease risk of falls and improve functional mobility using LRAD. ?Baseline: 50.52sec ?Goal status: INITIAL ?  ?6.  Pt will be able to ambulate >/= 500 ft with LRAD and supervison to demo improved household mobility ?Baseline: 107 ft with CGA and RW ?Goal status: INITIAL ?  ?ASSESSMENT: ?  ?CLINICAL IMPRESSION: ?Pt significantly limited by interruptions during session.  Limited to gait training this session with pt limited by nerve related sensations in the UE and LE that limit her tolerance.  Will  continue to progress as able next session. ?  ?  ?  ?OBJECTIVE IMPAIRMENTS Abnormal gait, decreased activity tolerance, decreased balance, decreased coordination, decreased endurance, decreased knowledge of use of DME, decreased mobility, difficulty walking, decreased strength, decreased safety awareness, increased edema, impaired sensation, impaired tone, postural dysfunction, and pain.  ?  ?ACTIVITY LIMITATIONS cleaning, meal prep, laundry, and medication management.  ?  ?PERSONAL FACTORS Time since onset of injury/illness/exacerbation and 3+ comorbidities: HTN, Pancreatisis, CVA, Stage III CKD  are also affecting patient's functional outcome.  ?  ?  ?REHAB POTENTIAL: Good ?  ?CLINICAL DECISION MAKING: Evolving/moderate complexity ?  ?EVALUATION COMPLEXITY: Moderate ?  ?PLAN: ?PT FREQUENCY: 2x/week ?  ?PT DURATION: 8 weeks (may need to decreased duration based upon OT and Pelvic Floor Needs ?  ?PLANNED INTERVENTIONS: Therapeutic exercises, Therapeutic activity, Neuromuscular re-education, Balance training, Gait training, Patient/Family education, Joint manipulation, Joint mobilization, Stair training, Vestibular training, Orthotic/Fit training, DME instructions, Aquatic Therapy, Cryotherapy, and Moist heat ?  ?PLAN FOR NEXT SESSION:  ?Continue use of hand orthotic.  continue with LE strengthening and stance  stability with emphasis on hip/knee flexion. Work on both Research scientist (physical sciences).  ?  ? ?Elease Etienne, PT, DPT ?[facility] ?[address], [address] ?[place], [place] [address] ?[phone] ?10/26/21, 3:32 PM  ? ?

## 2021-10-27 ENCOUNTER — Telehealth: Payer: Self-pay | Admitting: Nurse Practitioner

## 2021-10-27 NOTE — Telephone Encounter (Signed)
Copied from [place] [id number]. Topic: General - Other >> Oct 26, 2021  5:22 PM Yvette Rack wrote: Reason for CRM: Estill Bamberg with [facility] called for update on prescription request that was faxed on 10/24/21. Cb# [phone]

## 2021-10-27 NOTE — Telephone Encounter (Signed)
Copied from CRM #[id number]. Topic: General - Other >> Oct 26, 2021  5:22 PM McNeil, Ja-Kwan wrote: Reason for CRM: Amanda with [facility] called for update on prescription request that was faxed on 10/24/21. Cb# [phone] 

## 2021-10-28 ENCOUNTER — Ambulatory Visit: Payer: Medicare Other | Admitting: Physical Therapy

## 2021-10-28 ENCOUNTER — Encounter: Payer: Self-pay | Admitting: Physical Therapy

## 2021-10-28 ENCOUNTER — Ambulatory Visit: Payer: Medicare Other | Admitting: Occupational Therapy

## 2021-10-28 ENCOUNTER — Encounter: Payer: Self-pay | Admitting: Occupational Therapy

## 2021-10-28 VITALS — BP 122/80 | HR 86

## 2021-10-28 DIAGNOSIS — I69854 Hemiplegia and hemiparesis following other cerebrovascular disease affecting left non-dominant side: Secondary | ICD-10-CM

## 2021-10-28 DIAGNOSIS — R2681 Unsteadiness on feet: Secondary | ICD-10-CM | POA: Diagnosis not present

## 2021-10-28 DIAGNOSIS — M6281 Muscle weakness (generalized): Secondary | ICD-10-CM | POA: Diagnosis not present

## 2021-10-28 DIAGNOSIS — R208 Other disturbances of skin sensation: Secondary | ICD-10-CM

## 2021-10-28 DIAGNOSIS — R262 Difficulty in walking, not elsewhere classified: Secondary | ICD-10-CM | POA: Diagnosis not present

## 2021-10-28 DIAGNOSIS — R278 Other lack of coordination: Secondary | ICD-10-CM

## 2021-10-28 DIAGNOSIS — R2689 Other abnormalities of gait and mobility: Secondary | ICD-10-CM | POA: Diagnosis not present

## 2021-10-28 NOTE — Patient Instructions (Signed)
Access Code: [email or web address] URL: [email or web address] Date: 10/28/2021 Prepared by: Elease Etienne  Exercises - Seated Long Arc Quad  - 1 x daily - 4 x weekly - 2 sets - 12 reps - 2 seconds hold - Sit to Stand with Armchair  - 1 x daily - 4 x weekly - 2 sets - 6 reps - Standing March with Counter Support  - 1 x daily - 4 x weekly - 2 sets - 20 reps - Supine Bridge  - 1 x daily - 5 x weekly - 2 sets - 12 reps - Lower Trunk Rotations  - 1 x daily - 4 x weekly - 2 sets - 10 reps - Supine March  - 1 x daily - 3 x weekly - 2 sets - 10 reps - Seated Hamstring Curls with Resistance  - 1 x daily - 4 x weekly - 1 sets - 12 reps - Standing Single Leg Stance with Counter Support  - 1 x daily - 7 x weekly - 2 sets - 2 reps - 30 seconds hold - Seated Hamstring Stretch  - 1 x daily - 5 x weekly - 1 sets - 3 reps - 45 seconds hold - Long Sitting Calf Stretch with Strap  - 1 x daily - 5 x weekly - 1 sets - 3 reps - 30 seconds hold

## 2021-10-28 NOTE — Therapy (Signed)
OUTPATIENT OCCUPATIONAL THERAPY TREATMENT NOTE   Patient Name: Shirley Hansen MRN: [id number] DOB:[birth year], 56 y.o., female Today's Date: 10/28/2021  PCP: Gildardo Pounds, NP REFERRING PROVIDER: Gildardo Pounds, NP  END OF SESSION:   OT End of Session - 10/28/21 1449     Visit Number 9    Number of Visits 17    Date for OT Re-Evaluation 11/24/21    Authorization Type UHC Medicaid (pt reports Medicare will activate May 1st)    Authorization Time Period $4 copay VL: 27 combined    OT Start Time 1448    OT Stop Time 1530    OT Time Calculation (min) 42 min    Activity Tolerance Other (comment)   general malaise/stiffness   Behavior During Therapy WFL for tasks assessed/performed              Past Medical History:  Diagnosis Date   Hypertension    Pancreatitis    Stage 3a chronic kidney disease ([place])    Stroke ([place])    Past Surgical History:  Procedure Laterality Date   TUBAL LIGATION     Patient Active Problem List   Diagnosis Date Noted   Acute encephalopathy 04/09/2021   Hypertensive urgency 04/09/2021   Hemiparesis affecting left side as late effect of cerebrovascular accident (CVA) ([place]) 04/07/2021   Muscle spasticity 04/07/2021   History of stroke 03/23/2021   Chronic left shoulder pain 03/23/2021   Depression 03/23/2021   Glaucoma of both eyes 03/23/2021   Dizziness 03/13/2021   Orthostatic hypotension 03/13/2021   Weakness 03/05/2021   COVID-19 07/14/2020   Stroke ([place])    Stage 3a chronic kidney disease ([place])    Hypertension     ONSET DATE:  Patient had a stroke in Nov 2020, referral 09/08/2021    REFERRING DIAG: R29.898 (ICD-10-CM) - LUE weakness    THERAPY DIAG:  Unsteadiness on feet  Muscle weakness (generalized)  Other abnormalities of gait and mobility  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 Pt reports no improvement with ultrasound last session.  PAIN:  Are you having pain? Yes: NPRS scale: 7/10 Pain location: LUE neck and distal Pain description: tightness Aggravating factors: moving it Relieving factors: rest    OBJECTIVE:   TODAY'S TREATMENT:  10/28/21 Pt reports burning sensation in left side of face and orbital region that travels distally down neck and shoulder.  Long discussion re: therapeutic benefit and progress as patient is limited d/t new onset of burning sensation in Lt eye, neck and shoulder. Will continue to monitor and gauge progress and need for OT  Kinesiotape applied to Lt upper trap for inhibition of upper trap for managing tightness.   Medium Peg Board with LUE with one at a time for placing pegs into board. Pt removed with one at a time. Pt copied pattern with 10% accuracy. Pt req'd min cues and completed with mod/max difficulty and drops.        HOME EXERCISE PROGRAM:   09/26/21 Table Slides, flipping cards       GOALS: Goals reviewed with patient? No   SHORT TERM GOALS: Target date: 10/11/2021   Pt will be independent with HEP. Baseline: not issued at evaluation Goal status: Achieved   2.  Pt will increase box and blocks score by 4 blocks or greater with LUE. Baseline: Right 56 blocks, Left 15 blocks 5/3 - 16 Blocks - increased ataxia with speed Goal status: Ongoing   3.  Pt will improve grip strength by 5 lbs or greater with LUE. Baseline: Right: 70.3 lbs; Left: 34.1 lbs  5/3- 30.1 lb LUE Goal status: Ongoing   4.  Pt will verbalize understanding of adapted strategies and/or equipment PRN to increase safety and independence with ADLs and IADLs (I.e. chopping and peeling for meal prep) Baseline: difficulty with chopping vegetables and peeling Goal  status: Ongoing - 5/3 - reports attempting to peel a mango - but unable to stabilize mango in left hand - slipped out     LONG TERM GOALS: Target date: 11/24/21   Pt will be independent with updated HEP. Baseline: not issued at evaluation Goal status: Ongoing   2.  Pt will increase fine motor coordination in LUE by improving 9 hole peg test by achieving 5 pegs in 60 seconds or less. Baseline: Right: 23.82 sec; Left: 0 pegs in 60 sec Goal status: Ongoing   3.  Pt will increase box and blocks score by 10 blocks or greater with LUE. Baseline: Right 56 blocks, Left 15 blocks Goal status: Ongoing   4.  Pt will improve grip strength by 10 lbs or greater with LUE Baseline: Right: 70.3 lbs; Left: 34.1 lbs Goal status: Ongoing  5.  Pt will perform all basic ADLs with mod I Baseline: continues to need some help with grooming and fasteners Goal status: Ongoing   ASSESSMENT:   CLINICAL IMPRESSION: Pt with significant tension and increased tone and tightness in LUE. Limiting progress for therapy at this time.     PLAN: OT FREQUENCY: 2x/week   OT DURATION: 10 weeks 16 visits over 10 weeks d/t any scheduling conflicts   PLANNED INTERVENTIONS: self care/ADL training, therapeutic exercise, therapeutic activity, neuromuscular re-education, manual therapy, passive range of motion, functional mobility training, aquatic therapy, splinting, electrical stimulation, ultrasound, fluidotherapy, moist heat, cryotherapy, patient/family education, visual/perceptual remediation/compensation, and DME and/or AE instructions   PLAN FOR NEXT SESSION:  see how kinesiotape felt and if beneficial reapply PRN, positioning of shoulders and sitting posture for decreasing pain and discomfort.      Zachery Conch, OT 10/28/2021, 3:38 PM

## 2021-10-28 NOTE — Therapy (Signed)
OUTPATIENT PHYSICAL THERAPY TREATMENT NOTE   Patient Name: Shirley Hansen MRN: [id number] DOB:29-Mar-1966, 56 y.o., female 64 Date: 10/28/2021  PCP: Gildardo Pounds, NP REFERRING PROVIDER: Gildardo Pounds, NP   PT End of Session - 10/28/21 1533     Visit Number 11    Number of Visits 17    Date for PT Re-Evaluation 11/11/21   pushed out due to scheduling   Authorization Type UHC Medicaid; VL: 27    PT Start Time 1531    PT Stop Time 1615    PT Time Calculation (min) 44 min    Equipment Utilized During Treatment Gait belt;Other (comment)   hand orthotic   Activity Tolerance Patient tolerated treatment well    Behavior During Therapy WFL for tasks assessed/performed              Past Medical History:  Diagnosis Date   Hypertension    Pancreatitis    Stage 3a chronic kidney disease ([place])    Stroke [facility])    Past Surgical History:  Procedure Laterality Date   TUBAL LIGATION     Patient Active Problem List   Diagnosis Date Noted   Acute encephalopathy 04/09/2021   Hypertensive urgency 04/09/2021   Hemiparesis affecting left side as late effect of cerebrovascular accident (CVA) ([place]) 04/07/2021   Muscle spasticity 04/07/2021   History of stroke 03/23/2021   Chronic left shoulder pain 03/23/2021   Depression 03/23/2021   Glaucoma of both eyes 03/23/2021   Dizziness 03/13/2021   Orthostatic hypotension 03/13/2021   Weakness 03/05/2021   COVID-19 07/14/2020   Stroke ([place])    Stage 3a chronic kidney disease ([place])    Hypertension     REFERRING DIAG: J88.325 (ICD-10-CM) - Hemiparesis affecting left side as late effect of cerebrovascular accident (CVA) ([place])  THERAPY DIAG:  Unsteadiness on feet  Muscle weakness (generalized)  Other abnormalities of gait and mobility  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  Pt returns to mat table at 2:56pm.  Reports she is still having increased stiffness.  Pt states daughter threw hand orthotic in trash by accident.  Pt expresses desire to stretch left leg prior to trying ambulation today.  PAIN:  Are you having pain? No; Reports stiffness   Today's Vitals   10/28/21 1534  BP: 122/80  Pulse: 86    TODAY'S TREATMENT:   THEREX: PT provides PROM and prolonged stretching in side-lying to left hip flexor 4x16min into progressive hip ext Single knee to chest 2x30 sec Passive IR/ER stretch 4x30 sec each Passive DF stretch w/ grade 3 AP talocrural mobilization 6x30 seconds Passive hamstring stretch into progressive ROM 4x30 second Reviewed seated hamstring stretch 2x45 sec each LE and DF stretch w/ strap 2x30sec L only.  Added to HEP.   PATIENT EDUCATION: Education details: Additions to HEP.  Initiated discussion about d/c vs re-cert. Person educated: Patient Education method: Explanation; handout Education comprehension: verbalized understanding     HOME EXERCISE PROGRAM: Access Code: [email or web address] URL: [email or web address] Date: 09/12/2021 Prepared by: Elease Etienne Added 10/28/2021: - Seated Hamstring Stretch  - 1 x daily - 5 x weekly - 1 sets - 3 reps - 45 seconds hold - Long Sitting Calf Stretch with Strap  - 1 x daily - 5 x weekly - 1 sets - 3 reps -  30 seconds hold  GOALS: Goals reviewed with patient? Yes   SHORT TERM GOALS: Target date: 10/07/2021   Pt will be independent with initial HEP for improved strengthening and balance  Baseline: No HEP established; reports independence with current HEP Goal status: MET   2.  Pt will improve gait speed to >/= 0.75 ft/sec to demonstrate improved community ambulation Baseline: 0.46 ft/sec; 0.93 ft/sec  Goal status: MET   3.  Pt will improve 5x STS to </= 45 sec to demo improved functional LE strength  and balance  Baseline: 51.81; 35. 68 secs  Goal status: MET   4.  Pt will increase BERG balance score to 30/56 to demonstrate improved static balance. Baseline: 27/56; 34/56 Goal status: MET   5.  Pt will demonstrate TUG of <45 seconds in order to decrease risk of falls and improve functional mobility using LRAD. Baseline: 50.52sec; 34.29 secs Goal status: MET   6.  Pt will be able to ambulate >/= 300 ft with LRAD and CGA to demo improved household mobility Baseline: 107 ft with RW and CGA; 245 ft with CGA and RW Goal status: INITIAL   LONG TERM GOALS: Target date: 11/04/2021   Pt will be independent with final progressive HEP for improved strengthening and balance  Baseline: No HEP established Goal status: INITIAL   2.  Pt will improve gait speed to >/= 1.0 ft/sec to demonstrate improved community ambulation Baseline: 0.46 ft/sec Goal status: INITIAL   3.  Pt will improve 5x STS to </= 35 sec to demo improved functional LE strength and balance  Baseline: 51.81 secs Goal status: INITIAL   4.  Pt will increase BERG balance score to 33/56 to demonstrate improved static balance. Baseline: TBA Goal status: INITIAL   5.  Pt will demonstrate TUG of <40 seconds in order to decrease risk of falls and improve functional mobility using LRAD. Baseline: 50.52sec Goal status: INITIAL   6.  Pt will be able to ambulate >/= 500 ft with LRAD and supervison to demo improved household mobility Baseline: 107 ft with CGA and RW Goal status: INITIAL   ASSESSMENT:   CLINICAL IMPRESSION: Focus of skilled therapy session on addressing LLE tightness and adding exercises for self-management of problem at home.  Will continue to progress gait, strength, and balance at next session.       OBJECTIVE IMPAIRMENTS Abnormal gait, decreased activity tolerance, decreased balance, decreased coordination, decreased endurance, decreased knowledge of use of DME, decreased mobility, difficulty walking,  decreased strength, decreased safety awareness, increased edema, impaired sensation, impaired tone, postural dysfunction, and pain.    ACTIVITY LIMITATIONS cleaning, meal prep, laundry, and medication management.    PERSONAL FACTORS Time since onset of injury/illness/exacerbation and 3+ comorbidities: HTN, Pancreatisis, CVA, Stage III CKD  are also affecting patient's functional outcome.      REHAB POTENTIAL: Good   CLINICAL DECISION MAKING: Evolving/moderate complexity   EVALUATION COMPLEXITY: Moderate   PLAN: PT FREQUENCY: 2x/week   PT DURATION: 8 weeks (may need to decreased duration based upon OT and Pelvic Floor Needs   PLANNED INTERVENTIONS: Therapeutic exercises, Therapeutic activity, Neuromuscular re-education, Balance training, Gait training, Patient/Family education, Joint manipulation, Joint mobilization, Stair training, Vestibular training, Orthotic/Fit training, DME instructions, Aquatic Therapy, Cryotherapy, and Moist heat   PLAN FOR NEXT SESSION:  Assess and update LTGs on 5/24 and LTG date to match cert date!  Continue use of hand orthotic.  continue with LE strengthening and stance stability with emphasis on hip/knee flexion. Work  on both static and dynamic balance training.     Elease Etienne, PT, DPT Outpatient Neuro [facility] [address] [place], [place] [address] [phone] 10/28/21, 4:47 PM

## 2021-10-29 NOTE — Telephone Encounter (Signed)
All prescriptions were faxed on 10-20-2021. Any other prescriptions they will need to contact the original prescriber of those prescriptions.

## 2021-10-31 ENCOUNTER — Encounter: Payer: Self-pay | Admitting: Physical Therapy

## 2021-10-31 ENCOUNTER — Ambulatory Visit: Payer: Medicare Other | Admitting: Physical Therapy

## 2021-10-31 ENCOUNTER — Ambulatory Visit: Payer: Medicare Other | Admitting: Occupational Therapy

## 2021-10-31 DIAGNOSIS — I69854 Hemiplegia and hemiparesis following other cerebrovascular disease affecting left non-dominant side: Secondary | ICD-10-CM

## 2021-10-31 DIAGNOSIS — R278 Other lack of coordination: Secondary | ICD-10-CM | POA: Diagnosis not present

## 2021-10-31 DIAGNOSIS — R2681 Unsteadiness on feet: Secondary | ICD-10-CM | POA: Diagnosis not present

## 2021-10-31 DIAGNOSIS — R2689 Other abnormalities of gait and mobility: Secondary | ICD-10-CM

## 2021-10-31 DIAGNOSIS — M6281 Muscle weakness (generalized): Secondary | ICD-10-CM

## 2021-10-31 DIAGNOSIS — R262 Difficulty in walking, not elsewhere classified: Secondary | ICD-10-CM | POA: Diagnosis not present

## 2021-10-31 DIAGNOSIS — R208 Other disturbances of skin sensation: Secondary | ICD-10-CM | POA: Diagnosis not present

## 2021-10-31 NOTE — Therapy (Unsigned)
OUTPATIENT PHYSICAL THERAPY TREATMENT NOTE   Patient Name: Shirley Hansen MRN: [id number] DOB:[birth year], 56 y.o., female 71 Date: 11/01/2021  PCP: Gildardo Pounds, NP REFERRING PROVIDER: Gildardo Pounds, NP   PT End of Session - 10/31/21 1413     Visit Number 12    Number of Visits 17    Date for PT Re-Evaluation 11/11/21   pushed out due to scheduling   Authorization Type [employer]HC Medicaid; VL: 27    PT Start Time 2919   pt in restroom at onset of session   PT Stop Time 1444    PT Time Calculation (min) 31 min    Equipment Utilized During Treatment Gait belt;Other (comment)   hand orthotic   Activity Tolerance Patient tolerated treatment well    Behavior During Therapy WFL for tasks assessed/performed              Past Medical History:  Diagnosis Date   Hypertension    Pancreatitis    Stage 3a chronic kidney disease ([place])    Stroke [facility])    Past Surgical History:  Procedure Laterality Date   TUBAL LIGATION     Patient Active Problem List   Diagnosis Date Noted   Acute encephalopathy 04/09/2021   Hypertensive urgency 04/09/2021   Hemiparesis affecting left side as late effect of cerebrovascular accident (CVA) ([place]) 04/07/2021   Muscle spasticity 04/07/2021   History of stroke 03/23/2021   Chronic left shoulder pain 03/23/2021   Depression 03/23/2021   Glaucoma of both eyes 03/23/2021   Dizziness 03/13/2021   Orthostatic hypotension 03/13/2021   Weakness 03/05/2021   COVID-19 07/14/2020   Stroke ([place])    Stage 3a chronic kidney disease ([place])    Hypertension     REFERRING DIAG: T66.060 (ICD-10-CM) - Hemiparesis affecting left side as late effect of cerebrovascular accident (CVA) ([place])  THERAPY DIAG:  Unsteadiness on feet  Muscle weakness (generalized)  Other abnormalities of gait and mobility  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  States she tries to ignore her eye.  She states her legs are not bothering her really aside from feeling stiff occasionally after sitting or laying for too long and she is not getting as deep of a stretch at home vs here.   PAIN:  Are you having pain? Yes; Reports shoulder stiffness 7/10 and burning 5/10 (radiates from top of shoulder down into lower shoulder blade)      TODAY'S TREATMENT:   GAIT: Pt ambulates 230' w/ RW and left hand orthotic at [employer] on level surface in gym.  She ambulates w/ decreased speed, cued for left knee flexion engagement for foot clearance and increased R step length.  BP assessed post-ambulation:  135/87 HR 82bpm  THEREX: PROM of left hip progressed into prolonged hold at available end range of left hip extension to promote hip flexor stretch in side-lying 2x29mn Supine hip flexor stretch (added to HEP) performed w/ edu on safe home setup x2 minutes     PATIENT EDUCATION: Education details: Additions to HEP.  Continued discussion about discharge. Person educated: Patient Education method: Explanation; handout Education comprehension: verbalized understanding     HOME EXERCISE PROGRAM: Access Code: [email or web address]: [email or web address] Date: 09/12/2021 Prepared by: MElease EtienneAdded 10/28/2021: - Seated Hamstring  Stretch  - 1 x daily - 5 x weekly - 1 sets - 3 reps - 45 seconds hold - Long Sitting Calf Stretch with Strap  - 1 x daily - 5 x weekly - 1 sets - 3 reps - 30 seconds hold Added 10/31/2021 - Hip Flexor Stretch at Edge of Bed  - 1 x daily - 7 x weekly - 3 sets - 10 reps GOALS: Goals reviewed with patient? Yes   SHORT TERM GOALS: Target date: 10/07/2021   Pt will be independent with initial HEP for improved strengthening and balance  Baseline: No HEP  established; reports independence with current HEP Goal status: MET   2.  Pt will improve gait speed to >/= 0.75 ft/sec to demonstrate improved community ambulation Baseline: 0.46 ft/sec; 0.93 ft/sec  Goal status: MET   3.  Pt will improve 5x STS to </= 45 sec to demo improved functional LE strength and balance  Baseline: 51.81; 35. 68 secs  Goal status: MET   4.  Pt will increase BERG balance score to 30/56 to demonstrate improved static balance. Baseline: 27/56; 34/56 Goal status: MET   5.  Pt will demonstrate TUG of <45 seconds in order to decrease risk of falls and improve functional mobility using LRAD. Baseline: 50.52sec; 34.29 secs Goal status: MET   6.  Pt will be able to ambulate >/= 300 ft with LRAD and CGA to demo improved household mobility Baseline: 107 ft with RW and CGA; 245 ft with CGA and RW Goal status: INITIAL   LONG TERM GOALS: Target date: 11/04/2021   Pt will be independent with final progressive HEP for improved strengthening and balance  Baseline: No HEP established Goal status: INITIAL   2.  Pt will improve gait speed to >/= 1.0 ft/sec to demonstrate improved community ambulation Baseline: 0.46 ft/sec Goal status: INITIAL   3.  Pt will improve 5x STS to </= 35 sec to demo improved functional LE strength and balance  Baseline: 51.81 secs Goal status: INITIAL   4.  Pt will increase BERG balance score to 33/56 to demonstrate improved static balance. Baseline: TBA Goal status: INITIAL   5.  Pt will demonstrate TUG of <40 seconds in order to decrease risk of falls and improve functional mobility using LRAD. Baseline: 50.52sec Goal status: INITIAL   6.  Pt will be able to ambulate >/= 500 ft with LRAD and supervison to demo improved household mobility Baseline: 107 ft with CGA and RW Goal status: INITIAL   ASSESSMENT:   CLINICAL IMPRESSION: Focus of skilled therapy session on addressing LLE tightness and adding exercises for self-management of  problem at home.  Will continue to progress gait, strength, and balance at next session as pt prepares for discharge to promote self-management of ongoing issues.       OBJECTIVE IMPAIRMENTS Abnormal gait, decreased activity tolerance, decreased balance, decreased coordination, decreased endurance, decreased knowledge of use of DME, decreased mobility, difficulty walking, decreased strength, decreased safety awareness, increased edema, impaired sensation, impaired tone, postural dysfunction, and pain.    ACTIVITY LIMITATIONS cleaning, meal prep, laundry, and medication management.    PERSONAL FACTORS Time since onset of injury/illness/exacerbation and 3+ comorbidities: HTN, Pancreatisis, CVA, Stage III CKD  are also affecting patient's functional outcome.      REHAB POTENTIAL: Good   CLINICAL DECISION MAKING: Evolving/moderate complexity   EVALUATION COMPLEXITY: Moderate   PLAN: PT FREQUENCY: 2x/week   PT DURATION: 8 weeks (may need to decreased duration based upon OT  and Pelvic Floor Needs   PLANNED INTERVENTIONS: Therapeutic exercises, Therapeutic activity, Neuromuscular re-education, Balance training, Gait training, Patient/Family education, Joint manipulation, Joint mobilization, Stair training, Vestibular training, Orthotic/Fit training, DME instructions, Aquatic Therapy, Cryotherapy, and Moist heat   PLAN FOR NEXT SESSION:  Assess and update LTGs on 5/24 and LTG date to match cert date!  Continue use of left hand orthotic on RW.  continue with LE strengthening and stance stability with emphasis on hip/knee flexion. Work on both Research scientist (physical sciences).  SciFit/NuStep for endurance/ROM.    Elease Etienne, PT, DPT Outpatient Neuro [facility] [address] [place], [place] [address] [phone] 11/01/21, 12:53 PM

## 2021-10-31 NOTE — Patient Instructions (Signed)
Access Code: [email or web address] URL: [email or web address] Date: 10/31/2021 Prepared by: Camille Bal  Exercises - Seated Long Arc Quad  - 1 x daily - 4 x weekly - 2 sets - 12 reps - 2 seconds hold - Sit to Stand with Armchair  - 1 x daily - 4 x weekly - 2 sets - 6 reps - Standing March with Counter Support  - 1 x daily - 4 x weekly - 2 sets - 20 reps - Supine Bridge  - 1 x daily - 5 x weekly - 2 sets - 12 reps - Lower Trunk Rotations  - 1 x daily - 4 x weekly - 2 sets - 10 reps - Supine March  - 1 x daily - 3 x weekly - 2 sets - 10 reps - Seated Hamstring Curls with Resistance  - 1 x daily - 4 x weekly - 1 sets - 12 reps - Standing Single Leg Stance with Counter Support  - 1 x daily - 7 x weekly - 2 sets - 2 reps - 30 seconds hold - Seated Hamstring Stretch  - 1 x daily - 5 x weekly - 1 sets - 3 reps - 45 seconds hold - Long Sitting Calf Stretch with Strap  - 1 x daily - 5 x weekly - 1 sets - 3 reps - 30 seconds hold - Hip Flexor Stretch at Edge of Bed  - 1 x daily - 7 x weekly - 3 sets - 10 reps

## 2021-11-01 NOTE — Telephone Encounter (Signed)
Informed [facility] message per Provider Raul Del. They will contact patient to see where other medications were prescribed.

## 2021-11-02 ENCOUNTER — Ambulatory Visit: Payer: Medicare Other | Admitting: Occupational Therapy

## 2021-11-02 ENCOUNTER — Ambulatory Visit: Payer: Medicare Other

## 2021-11-02 NOTE — Therapy (Unsigned)
OUTPATIENT PHYSICAL THERAPY TREATMENT NOTE   Patient Name: Shirley Hansen MRN: [id number] DOB:1965-09-08, 56 y.o., female Today's Date: 11/02/2021  PCP: Gildardo Pounds, NP REFERRING PROVIDER: Gildardo Pounds, NP      Past Medical History:  Diagnosis Date   Hypertension    Pancreatitis    Stage 3a chronic kidney disease ([place])    Stroke [facility])    Past Surgical History:  Procedure Laterality Date   TUBAL LIGATION     Patient Active Problem List   Diagnosis Date Noted   Acute encephalopathy 04/09/2021   Hypertensive urgency 04/09/2021   Hemiparesis affecting left side as late effect of cerebrovascular accident (CVA) ([place]) 04/07/2021   Muscle spasticity 04/07/2021   History of stroke 03/23/2021   Chronic left shoulder pain 03/23/2021   Depression 03/23/2021   Glaucoma of both eyes 03/23/2021   Dizziness 03/13/2021   Orthostatic hypotension 03/13/2021   Weakness 03/05/2021   COVID-19 07/14/2020   Stroke ([place])    Stage 3a chronic kidney disease ([place])    Hypertension     REFERRING DIAG: S92.330 (ICD-10-CM) - Hemiparesis affecting left side as late effect of cerebrovascular accident (CVA) ([place])  THERAPY DIAG:  No diagnosis found.  PERTINENT HISTORY: HTN, Pancreatisis, CVA, Stage III CKD  PRECAUTIONS: Fall  SUBJECTIVE:  ***  PAIN:  Are you having pain? Yes; Reports shoulder stiffness 7/10 and burning 5/10 (radiates from top of shoulder down into lower shoulder blade)      TODAY'S TREATMENT:   GAIT: Pt ambulates 230' w/ RW and left hand orthotic at [place] on level surface in gym.  She ambulates w/ decreased speed, cued for left knee flexion engagement for foot clearance and increased R step length.  BP assessed post-ambulation:  135/87 HR 82bpm  THEREX: PROM of left hip progressed into prolonged hold at available end range of left hip extension to promote hip flexor stretch in side-lying 2x110min Supine hip flexor stretch (added to HEP) performed w/ edu  on safe home setup x2 minutes     PATIENT EDUCATION: Education details: Addition to HEP. Continued discussion about discharge next week. Person educated: Patient Education method: Explanation; handout Education comprehension: verbalized understanding     HOME EXERCISE PROGRAM: Access Code: [email or web address] URL: [email or web address] Date: 09/12/2021 Prepared by: Elease Etienne Added 10/28/2021: - Seated Hamstring Stretch  - 1 x daily - 5 x weekly - 1 sets - 3 reps - 45 seconds hold - Long Sitting Calf Stretch with Strap  - 1 x daily - 5 x weekly - 1 sets - 3 reps - 30 seconds hold - Hip Flexor Stretch at Edge of Bed  - 1 x daily - 7 x weekly - 3 sets - 10 reps GOALS: Goals reviewed with patient? Yes   SHORT TERM GOALS: Target date: 10/07/2021   Pt will be independent with initial HEP for improved strengthening and balance  Baseline: No HEP established; reports independence with current HEP Goal status: MET   2.  Pt will improve gait speed to >/= 0.75 ft/sec to demonstrate improved community ambulation Baseline: 0.46 ft/sec; 0.93 ft/sec  Goal status: MET   3.  Pt will improve 5x STS to </= 45 sec to demo improved functional LE strength and balance  Baseline: 51.81; 35. 68 secs  Goal status: MET   4.  Pt will increase BERG balance score to 30/56 to demonstrate improved static balance. Baseline: 27/56; 34/56 Goal status: MET   5.  Pt will demonstrate TUG of <45 seconds in order  to decrease risk of falls and improve functional mobility using LRAD. Baseline: 50.52sec; 34.29 secs Goal status: MET   6.  Pt will be able to ambulate >/= 300 ft with LRAD and CGA to demo improved household mobility Baseline: 107 ft with RW and CGA; 245 ft with CGA and RW Goal status: INITIAL   LONG TERM GOALS: Target date: 11/04/2021   Pt will be independent with final progressive HEP for improved strengthening and balance  Baseline: No HEP established Goal status: INITIAL   2.  Pt  will improve gait speed to >/= 1.0 ft/sec to demonstrate improved community ambulation Baseline: 0.46 ft/sec Goal status: INITIAL   3.  Pt will improve 5x STS to </= 35 sec to demo improved functional LE strength and balance  Baseline: 51.81 secs Goal status: INITIAL   4.  Pt will increase BERG balance score to 33/56 to demonstrate improved static balance. Baseline: TBA Goal status: INITIAL   5.  Pt will demonstrate TUG of <40 seconds in order to decrease risk of falls and improve functional mobility using LRAD. Baseline: 50.52sec Goal status: INITIAL   6.  Pt will be able to ambulate >/= 500 ft with LRAD and supervison to demo improved household mobility Baseline: 107 ft with CGA and RW Goal status: INITIAL   ASSESSMENT:   CLINICAL IMPRESSION: Today's skilled PT session focused on       OBJECTIVE IMPAIRMENTS Abnormal gait, decreased activity tolerance, decreased balance, decreased coordination, decreased endurance, decreased knowledge of use of DME, decreased mobility, difficulty walking, decreased strength, decreased safety awareness, increased edema, impaired sensation, impaired tone, postural dysfunction, and pain.    ACTIVITY LIMITATIONS cleaning, meal prep, laundry, and medication management.    PERSONAL FACTORS Time since onset of injury/illness/exacerbation and 3+ comorbidities: HTN, Pancreatisis, CVA, Stage III CKD  are also affecting patient's functional outcome.      REHAB POTENTIAL: Good   CLINICAL DECISION MAKING: Evolving/moderate complexity   EVALUATION COMPLEXITY: Moderate   PLAN: PT FREQUENCY: 2x/week   PT DURATION: 8 weeks (may need to decreased duration based upon OT and Pelvic Floor Needs   PLANNED INTERVENTIONS: Therapeutic exercises, Therapeutic activity, Neuromuscular re-education, Balance training, Gait training, Patient/Family education, Joint manipulation, Joint mobilization, Stair training, Vestibular training, Orthotic/Fit training, DME  instructions, Aquatic Therapy, Cryotherapy, and Moist heat   PLAN FOR NEXT SESSION:  Check LTG date pushed out, will plan to assess next week. Continue use of left hand orthotic on RW. Continue with LE strengthening and stance stability with emphasis on hip/knee flexion. Work on both Research scientist (physical sciences).  SciFit/NuStep for endurance/ROM.    Jones Bales, PT, DPT Outpatient Neuro [facility] [address], [place], [place] [address] [phone] 11/02/21, 12:41 PM

## 2021-11-08 ENCOUNTER — Ambulatory Visit: Payer: Medicare Other | Admitting: Occupational Therapy

## 2021-11-08 ENCOUNTER — Ambulatory Visit: Payer: Medicare Other

## 2021-11-08 ENCOUNTER — Other Ambulatory Visit: Payer: Self-pay | Admitting: Nurse Practitioner

## 2021-11-08 DIAGNOSIS — I10 Essential (primary) hypertension: Secondary | ICD-10-CM

## 2021-11-10 ENCOUNTER — Ambulatory Visit: Payer: Medicare Other | Admitting: Occupational Therapy

## 2021-11-10 ENCOUNTER — Ambulatory Visit: Payer: Medicare Other | Attending: Nurse Practitioner

## 2021-11-17 ENCOUNTER — Ambulatory Visit: Payer: Medicare Other | Admitting: Orthopaedic Surgery

## 2021-11-24 ENCOUNTER — Ambulatory Visit
Admission: RE | Admit: 2021-11-24 | Discharge: 2021-11-24 | Disposition: A | Discharge status: Home / Self Care | Payer: Medicare Other | Source: Ambulatory Visit | Attending: Nurse Practitioner | Admitting: Nurse Practitioner

## 2021-11-24 DIAGNOSIS — Z1231 Encounter for screening mammogram for malignant neoplasm of breast: Secondary | ICD-10-CM | POA: Diagnosis not present

## 2021-11-30 DIAGNOSIS — I69354 Hemiplegia and hemiparesis following cerebral infarction affecting left non-dominant side: Secondary | ICD-10-CM | POA: Diagnosis not present

## 2021-11-30 DIAGNOSIS — Z79899 Other long term (current) drug therapy: Secondary | ICD-10-CM | POA: Diagnosis not present

## 2021-11-30 DIAGNOSIS — I10 Essential (primary) hypertension: Secondary | ICD-10-CM | POA: Diagnosis not present

## 2021-11-30 DIAGNOSIS — Z1159 Encounter for screening for other viral diseases: Secondary | ICD-10-CM | POA: Diagnosis not present

## 2021-11-30 DIAGNOSIS — G63 Polyneuropathy in diseases classified elsewhere: Secondary | ICD-10-CM | POA: Diagnosis not present

## 2021-11-30 DIAGNOSIS — R2681 Unsteadiness on feet: Secondary | ICD-10-CM | POA: Diagnosis not present

## 2021-11-30 DIAGNOSIS — Z23 Encounter for immunization: Secondary | ICD-10-CM | POA: Diagnosis not present

## 2021-11-30 DIAGNOSIS — F17211 Nicotine dependence, cigarettes, in remission: Secondary | ICD-10-CM | POA: Diagnosis not present

## 2021-11-30 DIAGNOSIS — H269 Unspecified cataract: Secondary | ICD-10-CM | POA: Diagnosis not present

## 2021-11-30 DIAGNOSIS — Z0000 Encounter for general adult medical examination without abnormal findings: Secondary | ICD-10-CM | POA: Diagnosis not present

## 2021-11-30 DIAGNOSIS — H409 Unspecified glaucoma: Secondary | ICD-10-CM | POA: Diagnosis not present

## 2021-12-02 DIAGNOSIS — K859 Acute pancreatitis without necrosis or infection, unspecified: Secondary | ICD-10-CM | POA: Diagnosis not present

## 2021-12-02 DIAGNOSIS — I69354 Hemiplegia and hemiparesis following cerebral infarction affecting left non-dominant side: Secondary | ICD-10-CM | POA: Diagnosis not present

## 2021-12-02 DIAGNOSIS — N183 Chronic kidney disease, stage 3 unspecified: Secondary | ICD-10-CM | POA: Diagnosis not present

## 2021-12-02 DIAGNOSIS — I10 Essential (primary) hypertension: Secondary | ICD-10-CM | POA: Diagnosis not present

## 2021-12-29 ENCOUNTER — Other Ambulatory Visit: Payer: Self-pay | Admitting: Nurse Practitioner

## 2021-12-29 DIAGNOSIS — I10 Essential (primary) hypertension: Secondary | ICD-10-CM

## 2021-12-29 NOTE — Telephone Encounter (Signed)
Medication Refill - Medication: hydrochlorothiazide (HYDRODIURIL) 25 MG tablet amLODipine (NORVASC) 10 MG tablet   Has the patient contacted their pharmacy? Yes.   (Agent: If no, request that the patient contact the pharmacy for the refill. If patient does not wish to contact the pharmacy document the reason why and proceed with request.) (Agent: If yes, when and what did the pharmacy advise?)  Preferred Pharmacy (with phone number or street name):  [facility] [place] - [address]  [address] [place] [address]  Phone: [phone] Fax: [phone]   Has the patient been seen for an appointment in the last year OR does the patient have an upcoming appointment? Yes.    Agent: Please be advised that RX refills may take up to 3 business days. We ask that you follow-up with your pharmacy.

## 2021-12-30 NOTE — Telephone Encounter (Signed)
rxs were sent to same pharmacy on 10/20/21 #90/1, #45/1. Requested Prescriptions  Pending Prescriptions Disp Refills  . amLODipine (NORVASC) 10 MG tablet 90 tablet 1    Sig: Take 1 tablet (10 mg total) by mouth daily.     Cardiovascular: Calcium Channel Blockers 2 Passed - 12/29/2021  5:33 PM      Passed - Last BP in normal range    BP Readings from Last 1 Encounters:  10/28/21 122/80         Passed - Last Heart Rate in normal range    Pulse Readings from Last 1 Encounters:  10/28/21 86         Passed - Valid encounter within last 6 months    Recent Outpatient Visits          2 months ago Primary hypertension   [place] [facility] [place], [place] W, NP   4 months ago Hemiparesis affecting left side as late effect of cerebrovascular accident (CVA) [facility])   [facility] [place], Shea Stakes, NP   8 months ago Encounter to establish care   [facility] [place], [place] W, NP   8 months ago Hemiparesis affecting left side as late effect of cerebrovascular accident (CVA) ([facility])   [place] [facility] [place], [place], PA-C   9 months ago Primary hypertension   [place] [address] [facility] [place], [place], [place]-C      Future Appointments            In 2 weeks Claiborne Rigg, NP [facility]           . hydrochlorothiazide (HYDRODIURIL) 25 MG tablet 45 tablet 1    Sig: Take 0.5 tablets (12.5 mg total) by mouth daily.     Cardiovascular: Diuretics - Thiazide Passed - 12/29/2021  5:33 PM      Passed - Cr in normal range and within 180 days    Creatinine, Ser  Date Value Ref Range Status  10/17/2021 0.84 0.57 - 1.00 mg/dL Final         Passed - K in normal range and within 180 days    Potassium  Date Value Ref Range Status  10/17/2021 4.0 3.5 - 5.2 mmol/L Final         Passed - Na in normal range and within 180 days    Sodium  Date Value Ref  Range Status  10/17/2021 144 134 - 144 mmol/L Final         Passed - Last BP in normal range    BP Readings from Last 1 Encounters:  10/28/21 122/80         Passed - Valid encounter within last 6 months    Recent Outpatient Visits          2 months ago Primary hypertension   [place] [facility] [place], [place] W, NP   4 months ago Hemiparesis affecting left side as late effect of cerebrovascular accident (CVA) [facility])   [place] [facility] [place], Shea Stakes, NP   8 months ago Encounter to establish care   [facility] [place], [place] W, NP   8 months ago Hemiparesis affecting left side as late effect of cerebrovascular accident (CVA) [facility])   [place] [facility] [place], [place], [place]   9 months ago Primary hypertension  [facility] [place], [place], [place]      Future Appointments            In 2 weeks Claiborne Rigg, NP [facility]

## 2022-01-03 ENCOUNTER — Other Ambulatory Visit: Payer: Self-pay | Admitting: Nurse Practitioner

## 2022-01-03 DIAGNOSIS — I10 Essential (primary) hypertension: Secondary | ICD-10-CM

## 2022-01-18 ENCOUNTER — Ambulatory Visit: Payer: Medicare Other | Admitting: Nurse Practitioner

## 2022-03-10 ENCOUNTER — Telehealth: Payer: Self-pay | Admitting: Neurology

## 2022-03-10 NOTE — Telephone Encounter (Signed)
LVM and sent MyChart msg informing pt of r/s needed for 11/22 appt- office closing at lunch.

## 2022-03-21 ENCOUNTER — Other Ambulatory Visit: Payer: Self-pay

## 2022-03-21 ENCOUNTER — Emergency Department (HOSPITAL_COMMUNITY)
Admission: EM | Admit: 2022-03-21 | Discharge: 2022-03-21 | Disposition: A | Discharge status: Home / Self Care | Payer: Medicare Other | Attending: Emergency Medicine | Admitting: Emergency Medicine

## 2022-03-21 ENCOUNTER — Emergency Department (HOSPITAL_COMMUNITY): Payer: Medicare Other

## 2022-03-21 ENCOUNTER — Encounter (HOSPITAL_COMMUNITY): Payer: Self-pay | Admitting: *Deleted

## 2022-03-21 DIAGNOSIS — I129 Hypertensive chronic kidney disease with stage 1 through stage 4 chronic kidney disease, or unspecified chronic kidney disease: Secondary | ICD-10-CM | POA: Diagnosis not present

## 2022-03-21 DIAGNOSIS — N390 Urinary tract infection, site not specified: Secondary | ICD-10-CM | POA: Insufficient documentation

## 2022-03-21 DIAGNOSIS — M5136 Other intervertebral disc degeneration, lumbar region: Secondary | ICD-10-CM | POA: Diagnosis not present

## 2022-03-21 DIAGNOSIS — R112 Nausea with vomiting, unspecified: Secondary | ICD-10-CM | POA: Diagnosis present

## 2022-03-21 DIAGNOSIS — N1831 Chronic kidney disease, stage 3a: Secondary | ICD-10-CM | POA: Diagnosis not present

## 2022-03-21 DIAGNOSIS — R531 Weakness: Secondary | ICD-10-CM | POA: Diagnosis not present

## 2022-03-21 DIAGNOSIS — Z8673 Personal history of transient ischemic attack (TIA), and cerebral infarction without residual deficits: Secondary | ICD-10-CM | POA: Diagnosis not present

## 2022-03-21 DIAGNOSIS — N132 Hydronephrosis with renal and ureteral calculous obstruction: Secondary | ICD-10-CM | POA: Diagnosis not present

## 2022-03-21 DIAGNOSIS — R4182 Altered mental status, unspecified: Secondary | ICD-10-CM | POA: Insufficient documentation

## 2022-03-21 DIAGNOSIS — N200 Calculus of kidney: Secondary | ICD-10-CM

## 2022-03-21 DIAGNOSIS — D72829 Elevated white blood cell count, unspecified: Secondary | ICD-10-CM | POA: Diagnosis not present

## 2022-03-21 DIAGNOSIS — R1032 Left lower quadrant pain: Secondary | ICD-10-CM

## 2022-03-21 LAB — RAPID URINE DRUG SCREEN, HOSP PERFORMED
Amphetamines: NOT DETECTED
Barbiturates: NOT DETECTED
Benzodiazepines: NOT DETECTED
Cocaine: NOT DETECTED
Opiates: NOT DETECTED
Tetrahydrocannabinol: NOT DETECTED

## 2022-03-21 LAB — CBC WITH DIFFERENTIAL/PLATELET
Abs Immature Granulocytes: 0.05 10*3/uL (ref 0.00–0.07)
Basophils Absolute: 0 10*3/uL (ref 0.0–0.1)
Basophils Relative: 0 %
Eosinophils Absolute: 0.1 10*3/uL (ref 0.0–0.5)
Eosinophils Relative: 1 %
HCT: 43.2 % (ref 36.0–46.0)
Hemoglobin: 14 g/dL (ref 12.0–15.0)
Immature Granulocytes: 1 %
Lymphocytes Relative: 15 %
Lymphs Abs: 1.5 10*3/uL (ref 0.7–4.0)
MCH: 28.7 pg (ref 26.0–34.0)
MCHC: 32.4 g/dL (ref 30.0–36.0)
MCV: 88.7 fL (ref 80.0–100.0)
Monocytes Absolute: 0.1 10*3/uL (ref 0.1–1.0)
Monocytes Relative: 1 %
Neutro Abs: 8.7 10*3/uL — ABNORMAL HIGH (ref 1.7–7.7)
Neutrophils Relative %: 82 %
Platelets: 289 10*3/uL (ref 150–400)
RBC: 4.87 MIL/uL (ref 3.87–5.11)
RDW: 13.8 % (ref 11.5–15.5)
WBC: 10.6 10*3/uL — ABNORMAL HIGH (ref 4.0–10.5)
nRBC: 0 % (ref 0.0–0.2)

## 2022-03-21 LAB — URINALYSIS, ROUTINE W REFLEX MICROSCOPIC
Bilirubin Urine: NEGATIVE
Glucose, UA: NEGATIVE mg/dL
Ketones, ur: NEGATIVE mg/dL
Nitrite: NEGATIVE
Protein, ur: NEGATIVE mg/dL
Specific Gravity, Urine: 1.008 (ref 1.005–1.030)
pH: 7 (ref 5.0–8.0)

## 2022-03-21 LAB — COMPREHENSIVE METABOLIC PANEL
ALT: 22 U/L (ref 0–44)
AST: 25 U/L (ref 15–41)
Albumin: 4 g/dL (ref 3.5–5.0)
Alkaline Phosphatase: 92 U/L (ref 38–126)
Anion gap: 13 (ref 5–15)
BUN: 11 mg/dL (ref 6–20)
CO2: 20 mmol/L — ABNORMAL LOW (ref 22–32)
Calcium: 9.2 mg/dL (ref 8.9–10.3)
Chloride: 103 mmol/L (ref 98–111)
Creatinine, Ser: 0.96 mg/dL (ref 0.44–1.00)
GFR, Estimated: >60 mL/min (ref >60)
Glucose, Bld: 138 mg/dL — ABNORMAL HIGH (ref 70–99)
Potassium: 3.2 mmol/L — ABNORMAL LOW (ref 3.5–5.1)
Sodium: 136 mmol/L (ref 135–145)
Total Bilirubin: 0.5 mg/dL (ref 0.3–1.2)
Total Protein: 8 g/dL (ref 6.5–8.1)

## 2022-03-21 LAB — ETHANOL: Alcohol, Ethyl (B): <10 mg/dL (ref <10)

## 2022-03-21 LAB — LIPASE, BLOOD: Lipase: 26 U/L (ref 11–51)

## 2022-03-21 MED ORDER — LIDOCAINE 5 % EX PTCH
1.0000 | MEDICATED_PATCH | CUTANEOUS | Status: DC
  Administered 2022-03-21: 1 via TRANSDERMAL
  Filled 2022-03-21: qty 1

## 2022-03-21 MED ORDER — MORPHINE SULFATE (PF) 4 MG/ML IV SOLN
4.0000 mg | Freq: Once | INTRAVENOUS | Status: AC
  Administered 2022-03-21: 4 mg via INTRAVENOUS
  Filled 2022-03-21: qty 1

## 2022-03-21 MED ORDER — SODIUM CHLORIDE 0.9 % IV BOLUS
1000.0000 mL | Freq: Once | INTRAVENOUS | Status: AC
  Administered 2022-03-21: 1000 mL via INTRAVENOUS

## 2022-03-21 MED ORDER — IOHEXOL 350 MG/ML SOLN
100.0000 mL | Freq: Once | INTRAVENOUS | Status: AC | PRN
  Administered 2022-03-21: 75 mL via INTRAVENOUS

## 2022-03-21 MED ORDER — NAPROXEN 500 MG PO TABS: 500.0000 mg | ORAL_TABLET | Freq: Two times a day (BID) | ORAL | 0 refills | Status: DC

## 2022-03-21 MED ORDER — ONDANSETRON HCL 4 MG/2ML IJ SOLN
4.0000 mg | Freq: Once | INTRAMUSCULAR | Status: AC
  Administered 2022-03-21: 4 mg via INTRAVENOUS
  Filled 2022-03-21: qty 2

## 2022-03-21 MED ORDER — DICYCLOMINE HCL 20 MG PO TABS: 20.0000 mg | ORAL_TABLET | Freq: Two times a day (BID) | ORAL | 0 refills | Status: DC

## 2022-03-21 MED ORDER — KETOROLAC TROMETHAMINE 15 MG/ML IJ SOLN
15.0000 mg | Freq: Once | INTRAMUSCULAR | Status: AC
  Administered 2022-03-21: 15 mg via INTRAVENOUS
  Filled 2022-03-21: qty 1

## 2022-03-21 MED ORDER — CEFADROXIL 500 MG PO CAPS
500.0000 mg | ORAL_CAPSULE | Freq: Two times a day (BID) | ORAL | Status: DC
  Administered 2022-03-21: 500 mg via ORAL
  Filled 2022-03-21: qty 1

## 2022-03-21 MED ORDER — CEFADROXIL 500 MG PO CAPS: 500.0000 mg | ORAL_CAPSULE | Freq: Two times a day (BID) | ORAL | 0 refills | Status: AC

## 2022-03-21 NOTE — ED Provider Notes (Signed)
[facility] EMERGENCY DEPARTMENT Provider Note   CSN: [id number] Arrival date & time: 03/21/22  [id number]     History  Chief Complaint  Patient presents with   Weakness    Stafanie Munafo is a 56 y.o. female.  HPI     This is a 56 year old female who presents with altered mental status, nausea, diarrhea.  Patient reports that she went to bed normally.  She woke up from her sleep and had an urge to go to the bathroom.  She states she went to the bathroom but "only a little bit came out.  She felt very nauseated.  She states that she felt weak.  She is unsure whether she lost consciousness although EMS reports that she was noted unconscious.  She denies any chest pain, shortness of breath.  She reports some abdominal soreness.  She did not take anything for her symptoms.  No recent fevers.  No known sick contacts.  Denies weakness, numbness, strokelike symptoms.  Home Medications Prior to Admission medications   Medication Sig Start Date End Date Taking? Authorizing Provider  acetaminophen (TYLENOL) 325 MG tablet Take 650 mg by mouth every 6 (six) hours as needed for mild pain.    [provider]  albuterol (VENTOLIN HFA) 108 (90 Base) MCG/ACT inhaler Inhale 2 puffs into the lungs every 2 (two) hours as needed for wheezing or shortness of breath. 07/28/20   Ghimire, Henreitta Leber, MD  amLODipine (NORVASC) 10 MG tablet TAKE 1 TABLET BY MOUTH EVERY DAY 01/03/22   Charlott Rakes, MD  Artificial Tear Solution (GENTEAL TEARS OP) Place 1 drop into both eyes daily as needed (dry eyes).    [provider]  atorvastatin (LIPITOR) 80 MG tablet Take 1 tablet (80 mg total) by mouth at bedtime. 10/20/21 04/18/22  Gildardo Pounds, NP  brimonidine (ALPHAGAN) 0.2 % ophthalmic solution Place 1 drop into both eyes 2 (two) times daily. 06/26/20   [provider]  carvedilol (COREG) 25 MG tablet Take 1 tablet (25 mg total) by mouth 2 (two) times daily. 10/20/21   Gildardo Pounds, NP  diclofenac Sodium (VOLTAREN) 1 % GEL Apply 1 application topically 4 (four) times daily as needed (pain). 05/20/20   [provider]  dorzolamide-timolol (COSOPT) 22.3-6.8 MG/ML ophthalmic solution Place 1 drop into both eyes 2 (two) times daily. 05/31/20   [provider]  famotidine (PEPCID) 20 MG tablet Take 20 mg by mouth 2 (two) times daily. Patient not taking: Reported on 10/17/2021    [provider]  hydrochlorothiazide (HYDRODIURIL) 25 MG tablet Take 0.5 tablets (12.5 mg total) by mouth daily. 10/20/21   Gildardo Pounds, NP  latanoprost (XALATAN) 0.005 % ophthalmic solution Place 1 drop into both eyes at bedtime. 06/17/20   [provider]  Misc. Devices (EXTENDABLE BEDSIDE RAIL) MISC ICD 10 H2288890 08/29/21   Gildardo Pounds, NP  Netarsudil Dimesylate 0.02 % SOLN Place 1 drop into both eyes at bedtime. 02/07/21   [provider]  pregabalin (LYRICA) 75 MG capsule TAKE 1 CAPSULE (75 MG TOTAL) BY MOUTH 2 (TWO) TIMES DAILY. RESUME AFTER FEW DAYS 10/24/21 11/23/21  Alric Ran, MD  tiZANidine (ZANAFLEX) 4 MG tablet Take 1 tablet (4 mg total) by mouth every 6 (six) hours as needed for muscle spasms. 10/20/21   Gildardo Pounds, NP  traMADol (ULTRAM) 50 MG tablet Take 1 tablet (50 mg total) by mouth 3 (three) times daily as needed. Patient not taking: Reported on  10/17/2021 06/16/21   Aundra Dubin, PA-C      Allergies    Patient has no known allergies.    Review of Systems   Review of Systems  Constitutional:  Negative for fever.  Respiratory:  Negative for shortness of breath.   Cardiovascular:  Negative for chest pain.  Gastrointestinal:  Positive for diarrhea, nausea and vomiting. Negative for blood in stool.  All other systems reviewed and are negative.   Physical Exam Updated Vital Signs BP (!) 120/92 (BP Location: Left Arm)   Pulse 90   Temp 98.2 F (36.8 C) (Oral)   Resp 18   SpO2 98%  Physical Exam Vitals and nursing note  reviewed.  Constitutional:      Appearance: She is well-developed. She is obese. She is not ill-appearing.  HENT:     Head: Normocephalic and atraumatic.     Mouth/Throat:     Mouth: Mucous membranes are dry.  Eyes:     Pupils: Pupils are equal, round, and reactive to light.  Cardiovascular:     Rate and Rhythm: Normal rate and regular rhythm.     Heart sounds: Normal heart sounds.  Pulmonary:     Effort: Pulmonary effort is normal. No respiratory distress.     Breath sounds: No wheezing.  Abdominal:     General: Bowel sounds are normal.     Palpations: Abdomen is soft.     Tenderness: There is abdominal tenderness. There is no guarding or rebound.     Comments: Diffuse tenderness to palpation, no rebound or guarding  Musculoskeletal:     Cervical back: Neck supple.  Skin:    General: Skin is warm and dry.  Neurological:     Mental Status: She is alert and oriented to person, place, and time.  Psychiatric:        Mood and Affect: Mood normal.     ED Results / Procedures / Treatments   Labs (all labs ordered are listed, but only abnormal results are displayed) Labs Reviewed  CBC WITH DIFFERENTIAL/PLATELET  COMPREHENSIVE METABOLIC PANEL  ETHANOL  AMMONIA  RAPID URINE DRUG SCREEN, HOSP PERFORMED  URINALYSIS, ROUTINE W REFLEX MICROSCOPIC  LIPASE, BLOOD    EKG None  Radiology No results found.  Procedures Procedures    Medications Ordered in ED Medications - No data to display  ED Course/ Medical Decision Making/ A&P Clinical Course as of 03/21/22 2300  Tue Mar 21, 2022  0704 Pending CTAP, treat urine?  [MK]    Clinical Course User Index [MK] Kommor, Madison, MD                           Medical Decision Making Amount and/or Complexity of Data Reviewed Labs: ordered. Radiology: ordered.  Risk Prescription drug management.   This patient presents to the ED for concern of diarrhea, vomiting, loss of consciousness, this involves an extensive number  of treatment options, and is a complaint that carries with it a high risk of complications and morbidity.  I considered the following differential and admission for this acute, potentially life threatening condition.  The differential diagnosis includes gastroenteritis, obstructive pathology, viral illness, appendicitis, cholecystitis  MDM:    This is a 56 year old female who presents with nausea, vomiting, diarrhea.  Onset this evening.  Questionable loss of consciousness while on the toilet.  She is nontoxic-appearing.  She has some diffuse abdominal tenderness without signs of peritonitis.  Labs obtained.  Labs are  largely Reassuring.  She does have a slight leukocytosis.  No significant metabolic derangements.  LFTs are normal.  Urinalysis does have leukocytes and bacteria.  We will culture.  Patient reports progressively worsening pain.  For this reason, CT scan was ordered.  Regarding questionable loss of consciousness, EKG is without acute arrhythmia.  CT scan of the head was obtained from triage and reviewed.  This is negative.  CT scan pending at signout. (Labs, imaging, consults)  Labs: I Ordered, and personally interpreted labs.  The pertinent results include: CBC, CMP, lipase, urinalysis  Imaging Studies ordered: I ordered imaging studies including CT head, CT abdomen pelvis I independently visualized and interpreted imaging. I agree with the radiologist interpretation  Additional history obtained from EMS.  External records from outside source obtained and reviewed including prior evaluations  Cardiac Monitoring: The patient was maintained on a cardiac monitor.  I personally viewed and interpreted the cardiac monitored which showed an underlying rhythm of: Sinus rhythm  Reevaluation: After the interventions noted above, I reevaluated the patient and found that they have :stayed the same  Social Determinants of Health: Lives independently  Disposition: Discharge  Co  morbidities that complicate the patient evaluation  Past Medical History:  Diagnosis Date   Hypertension    Pancreatitis    Stage 3a chronic kidney disease ([place])    Stroke ([place])      Medicines Meds ordered this encounter  Medications   sodium chloride 0.9 % bolus 1,000 mL   ondansetron (ZOFRAN) injection 4 mg   morphine (PF) 4 MG/ML injection 4 mg   iohexol (OMNIPAQUE) 350 MG/ML injection 100 mL   DISCONTD: cefadroxil (DURICEF) capsule 500 mg   ketorolac (TORADOL) 15 MG/ML injection 15 mg   DISCONTD: lidocaine (LIDODERM) 5 % 1 patch   cefadroxil (DURICEF) 500 MG capsule    Sig: Take 1 capsule (500 mg total) by mouth 2 (two) times daily for 7 days.    Dispense:  14 capsule    Refill:  0   dicyclomine (BENTYL) 20 MG tablet    Sig: Take 1 tablet (20 mg total) by mouth 2 (two) times daily.    Dispense:  20 tablet    Refill:  0   naproxen (NAPROSYN) 500 MG tablet    Sig: Take 1 tablet (500 mg total) by mouth 2 (two) times daily.    Dispense:  30 tablet    Refill:  0    I have reviewed the patients home medicines and have made adjustments as needed  Problem List / ED Course: Problem List Items Addressed This Visit   None Visit Diagnoses     Left lower quadrant abdominal pain    -  Primary   Nephrolithiasis       Relevant Medications   morphine (PF) 4 MG/ML injection 4 mg (Completed)   Urinary tract infection without hematuria, site unspecified       Relevant Medications   cefadroxil (DURICEF) 500 MG capsule                   Final Clinical Impression(s) / ED Diagnoses Final diagnoses:  None    Rx / DC Orders ED Discharge Orders     None         Merryl Hacker, MD 03/21/22 2303

## 2022-03-21 NOTE — ED Provider Triage Note (Signed)
Emergency Medicine Provider Triage Evaluation Note  Shirley Hansen , a 56 y.o. female  was evaluated in triage.  Pt complains of weakness, started today, states that she feels weak all over, states that she went to use the bathroom and is unable to get off the toilet, she states she had a small bowel movement, no headaches no change in vision no paresthesia or weakness upper or lower extremities she denies any chest pain or shortness of breath, she states she has some stomach pain with some slight nausea no other complaints..  Review of Systems  Positive: Weakness, diarrhea Negative: Chest pain, shortness of breath  Physical Exam  BP (!) 120/92 (BP Location: Left Arm)   Pulse 90   Temp 98.2 F (36.8 C) (Oral)   Resp 18   SpO2 98%  Gen:   Awake, no distress   Resp:  Normal effort  MSK:   Moves extremities without difficulty  Other:  Cranial nerves II through XII grossly intact no difficulty with word finding fine two-step commands no unilateral weakness present.  Medical Decision Making  Medically screening exam initiated at 4:20 AM.  Appropriate orders placed.  Shirley Hansen was informed that the remainder of the evaluation will be completed by another provider, this initial triage assessment does not replace that evaluation, and the importance of remaining in the ED until their evaluation is complete.  Lab work imaging been ordered will need further work-up.   Marcello Fennel, PA-C 03/21/22 0422

## 2022-03-21 NOTE — ED Triage Notes (Signed)
Pt arrives via [facility] from home, per report by medic, pt was found unresponsive on the toilet by her daughter. On EMS arrival to the scene, pt was In and out of consciousness, weak carotid, unable to obtain BP initially, IV established, given 325ml fluid, repeat BP 118 palpated, 130/98 was last. Pt does report that she does not have a pulse in the left wrist, she has been told this but does not know why. LLQ cramping sensation. 160cbg, hr 90 RBB, IV established in the right AC.

## 2022-03-21 NOTE — ED Triage Notes (Signed)
Pt says that she has been feeling very weak tonight, she felt like she had to go have a bowel movement at home, but she only had a small amount. She has abdominal cramping. She arrived to the ED and had an episode of diarrhea.

## 2022-03-21 NOTE — ED Provider Notes (Signed)
  Physical Exam  BP (!) 120/92 (BP Location: Left Arm)   Pulse 90   Temp 98.2 F (36.8 C) (Oral)   Resp 18   SpO2 98%   Physical Exam Vitals and nursing note reviewed.  Constitutional:      General: She is not in acute distress.    Appearance: She is well-developed.  HENT:     Head: Normocephalic and atraumatic.  Eyes:     Conjunctiva/sclera: Conjunctivae normal.  Cardiovascular:     Rate and Rhythm: Normal rate and regular rhythm.     Heart sounds: No murmur heard. Pulmonary:     Effort: Pulmonary effort is normal. No respiratory distress.     Breath sounds: Normal breath sounds.  Abdominal:     Palpations: Abdomen is soft.     Tenderness: There is abdominal tenderness.  Musculoskeletal:        General: No swelling.     Cervical back: Neck supple.  Skin:    General: Skin is warm and dry.     Capillary Refill: Capillary refill takes less than 2 seconds.  Neurological:     Mental Status: She is alert.  Psychiatric:        Mood and Affect: Mood normal.     Procedures  Procedures  ED Course / MDM   Clinical Course as of 03/21/22 0901  Tue Mar 21, 2022  0704 Pending CTAP, treat urine?  [MK]    Clinical Course User Index [MK] Hina Gupta, MD   Medical Decision Making Amount and/or Complexity of Data Reviewed Labs: ordered. Radiology: ordered.  Risk Prescription drug management.   Patient received in handoff.  Abdominal pain and suspected vasovagal response from pain on the toilet today.  Pending CTAP at time of signout.  Urinalysis concerning for urinary infection and patient treated with cefadroxil.  CT abdomen pelvis with a 7 mm lower pole right renal calculus with mild right hydroureter with possibility of a recently passed stone but is otherwise unremarkable.  Advanced age atherosclerotic calcifications in the aorta and branch vessels.  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  Patient does state that this feels similar to her previous kidney stone.  We will cover with antibiotics, Bentyl will be given for possibility of intestinal gas and Naprosyn for pain control.  Patient given strict return precautions which she voiced understanding she was discharged       Teressa Lower, MD 03/21/22 [phone]

## 2022-03-24 LAB — URINE CULTURE: Culture: >=100000 — AB

## 2022-03-25 ENCOUNTER — Telehealth (HOSPITAL_BASED_OUTPATIENT_CLINIC_OR_DEPARTMENT_OTHER): Payer: Self-pay | Admitting: *Deleted

## 2022-03-25 NOTE — Telephone Encounter (Signed)
Post ED Visit - Positive Culture Follow-up  Culture report reviewed by antimicrobial stewardship pharmacist: [place] Team []  Elenor Quinones, Pharm.D. []  Heide Guile, Pharm.D., BCPS AQ-ID []  Parks Neptune, Pharm.D., BCPS []  Alycia Rossetti, Pharm.D., BCPS []  [place], Pharm.D., BCPS, AAHIVP []  Legrand Como, Pharm.D., BCPS, AAHIVP []  Salome Arnt, PharmD, BCPS []  Johnnette Gourd, PharmD, BCPS []  Hughes Better, PharmD, BCPS []  Leeroy Cha, PharmD []  Laqueta Linden, PharmD, BCPS [x]  Bertis Ruddy, PharmD  [place] Team []  Leodis Sias, PharmD []  Lindell Spar, PharmD []  Royetta Asal, PharmD []  Graylin Shiver, Rph []  Rema Fendt) Glennon Mac, PharmD []  Arlyn Dunning, PharmD []  Netta Cedars, PharmD []  Dia Sitter, PharmD []  Leone Haven, PharmD []  Gretta Arab, PharmD []  Theodis Shove, PharmD []  Peggyann Juba, PharmD []  Reuel Boom, PharmD   Positive urine culture Treated with Cefadroxil, organism sensitive to the same and no further patient follow-up is required at this time.  Rosie Fate 03/25/2022, 12:13 PM

## 2022-03-31 ENCOUNTER — Other Ambulatory Visit: Payer: Self-pay | Admitting: Neurology

## 2022-04-26 ENCOUNTER — Emergency Department (HOSPITAL_COMMUNITY): Payer: Medicare Other

## 2022-04-26 ENCOUNTER — Emergency Department (HOSPITAL_COMMUNITY)
Admission: EM | Admit: 2022-04-26 | Discharge: 2022-04-26 | Disposition: A | Discharge status: Home / Self Care | Payer: Medicare Other | Attending: Emergency Medicine | Admitting: Emergency Medicine

## 2022-04-26 ENCOUNTER — Encounter (HOSPITAL_COMMUNITY): Payer: Self-pay | Admitting: Pharmacy Technician

## 2022-04-26 ENCOUNTER — Other Ambulatory Visit: Payer: Self-pay

## 2022-04-26 DIAGNOSIS — Z79899 Other long term (current) drug therapy: Secondary | ICD-10-CM | POA: Diagnosis not present

## 2022-04-26 DIAGNOSIS — M792 Neuralgia and neuritis, unspecified: Secondary | ICD-10-CM | POA: Diagnosis not present

## 2022-04-26 DIAGNOSIS — Z7982 Long term (current) use of aspirin: Secondary | ICD-10-CM | POA: Diagnosis not present

## 2022-04-26 DIAGNOSIS — R202 Paresthesia of skin: Secondary | ICD-10-CM | POA: Diagnosis present

## 2022-04-26 DIAGNOSIS — N1831 Chronic kidney disease, stage 3a: Secondary | ICD-10-CM | POA: Insufficient documentation

## 2022-04-26 DIAGNOSIS — I129 Hypertensive chronic kidney disease with stage 1 through stage 4 chronic kidney disease, or unspecified chronic kidney disease: Secondary | ICD-10-CM | POA: Insufficient documentation

## 2022-04-26 DIAGNOSIS — Z87891 Personal history of nicotine dependence: Secondary | ICD-10-CM | POA: Diagnosis not present

## 2022-04-26 DIAGNOSIS — Z8616 Personal history of COVID-19: Secondary | ICD-10-CM | POA: Diagnosis not present

## 2022-04-26 LAB — CBC
HCT: 38 % (ref 36.0–46.0)
Hemoglobin: 12.7 g/dL (ref 12.0–15.0)
MCH: 28.9 pg (ref 26.0–34.0)
MCHC: 33.4 g/dL (ref 30.0–36.0)
MCV: 86.6 fL (ref 80.0–100.0)
Platelets: 378 10*3/uL (ref 150–400)
RBC: 4.39 MIL/uL (ref 3.87–5.11)
RDW: 13.9 % (ref 11.5–15.5)
WBC: 7.8 10*3/uL (ref 4.0–10.5)
nRBC: 0 % (ref 0.0–0.2)

## 2022-04-26 LAB — BASIC METABOLIC PANEL
Anion gap: 12 (ref 5–15)
BUN: 8 mg/dL (ref 6–20)
CO2: 27 mmol/L (ref 22–32)
Calcium: 8.8 mg/dL — ABNORMAL LOW (ref 8.9–10.3)
Chloride: 101 mmol/L (ref 98–111)
Creatinine, Ser: 0.78 mg/dL (ref 0.44–1.00)
GFR, Estimated: >60 mL/min (ref >60)
Glucose, Bld: 102 mg/dL — ABNORMAL HIGH (ref 70–99)
Potassium: 3.4 mmol/L — ABNORMAL LOW (ref 3.5–5.1)
Sodium: 140 mmol/L (ref 135–145)

## 2022-04-26 LAB — TROPONIN I (HIGH SENSITIVITY): Troponin I (High Sensitivity): 4 ng/L (ref <18)

## 2022-04-26 MED ORDER — GABAPENTIN 100 MG PO CAPS: 200.0000 mg | ORAL_CAPSULE | Freq: Three times a day (TID) | ORAL | 0 refills | Status: DC

## 2022-04-26 MED ORDER — ACETAMINOPHEN 500 MG PO TABS
1000.0000 mg | ORAL_TABLET | Freq: Once | ORAL | Status: AC
  Administered 2022-04-26: 1000 mg via ORAL
  Filled 2022-04-26: qty 2

## 2022-04-26 MED ORDER — GABAPENTIN 300 MG PO CAPS
300.0000 mg | ORAL_CAPSULE | Freq: Once | ORAL | Status: AC
  Administered 2022-04-26: 300 mg via ORAL
  Filled 2022-04-26: qty 1

## 2022-04-26 NOTE — ED Notes (Signed)
Assigned to this pt at shift change. Pt is alert and oriented. Was connect to cardiac monitor. Pt currently denies any pain. Awaiting evaluation by EDP.

## 2022-04-26 NOTE — ED Notes (Signed)
Discharge instructions were discussed with pt. Pt verbalized understanding with no questions at this time. Pt to go home with daughter.

## 2022-04-26 NOTE — ED Provider Triage Note (Signed)
Emergency Medicine Provider Triage Evaluation Note  Shirley Hansen , a 56 y.o. female  was evaluated in triage.  Pt complains of intermittent chest pain for the last 3 days.  Comes and goes, worse when lying down flat.  No nausea or vomiting, denies feeling short of breath.  No exertional component pleuritic component..  Review of Systems  Per HPI  Physical Exam  There were no vitals taken for this visit. Gen:   Awake, no distress   Resp:  Normal effort  MSK:   Moves extremities without difficulty  Other:    Medical Decision Making  Medically screening exam initiated at 11:42 AM.  Appropriate orders placed.  Renly Guedes was informed that the remainder of the evaluation will be completed by another provider, this initial triage assessment does not replace that evaluation, and the importance of remaining in the ED until their evaluation is complete.     Theron Arista, PA-C 04/26/22 1142

## 2022-04-26 NOTE — ED Provider Notes (Signed)
[facility] EMERGENCY DEPARTMENT Provider Note  CSN: [id number] Arrival date & time: 04/26/22 1117  Chief Complaint(s) Chest Pain  HPI Shirley Hansen is a 56 y.o. female with history of chronic kidney disease, hypertension, prior stroke with left-sided deficits including numbness and weakness presenting with left-sided body pain.  Patient reports burning pain to the whole left side of her body.  She reports tingling as well.  She reports that she has had the tingling for a while.  The pain is worsened over the past few days.  She reports that it extends from her face down to her feet.  No nausea, vomiting.  No fevers or chills.  No difficulty breathing.  Pain is not pleuritic, exertional.  She has no rashes.  No fevers or chills.   Past Medical History Past Medical History:  Diagnosis Date   Hypertension    Pancreatitis    Stage 3a chronic kidney disease (HCC)    Stroke [facility])    Patient Active Problem List   Diagnosis Date Noted   Acute encephalopathy 04/09/2021   Hypertensive urgency 04/09/2021   Hemiparesis affecting left side as late effect of cerebrovascular accident (CVA) (HCC) 04/07/2021   Muscle spasticity 04/07/2021   History of stroke 03/23/2021   Chronic left shoulder pain 03/23/2021   Depression 03/23/2021   Glaucoma of both eyes 03/23/2021   Dizziness 03/13/2021   Orthostatic hypotension 03/13/2021   Weakness 03/05/2021   COVID-19 07/14/2020   Stroke (HCC)    Stage 3a chronic kidney disease (HCC)    Hypertension    Home Medication(s) Prior to Admission medications   Medication Sig Start Date End Date Taking? Authorizing Provider  acetaminophen (TYLENOL) 325 MG tablet Take 650 mg by mouth every 6 (six) hours as needed for mild pain.   Yes [provider]  albuterol (VENTOLIN HFA) 108 (90 Base) MCG/ACT inhaler Inhale 2 puffs into the lungs every 2 (two) hours as needed for wheezing or shortness of breath. 07/28/20  Yes Ghimire, Werner Lean, MD   amLODipine (NORVASC) 10 MG tablet TAKE 1 TABLET BY MOUTH EVERY DAY 01/03/22  Yes Hoy Register, MD  Artificial Tear Solution (GENTEAL TEARS OP) Place 1 drop into both eyes daily as needed (dry eyes).   Yes [provider]  aspirin EC 81 MG tablet Take 81 mg by mouth daily. Swallow whole.   Yes [provider]  atorvastatin (LIPITOR) 80 MG tablet Take 1 tablet (80 mg total) by mouth at bedtime. 10/20/21 04/26/22 Yes Claiborne Rigg, NP  brimonidine (ALPHAGAN) 0.2 % ophthalmic solution Place 1 drop into both eyes 2 (two) times daily. 06/26/20  Yes [provider]  carvedilol (COREG) 25 MG tablet Take 1 tablet (25 mg total) by mouth 2 (two) times daily. 10/20/21  Yes Claiborne Rigg, NP  Cholecalciferol (VITAMIN D3) 50 MCG (2000 UT) capsule Take 2,000 Units by mouth every morning. 04/17/22  Yes [provider]  diclofenac Sodium (VOLTAREN) 1 % GEL Apply 1 application topically 4 (four) times daily as needed (pain). 05/20/20  Yes [provider]  dorzolamide-timolol (COSOPT) 22.3-6.8 MG/ML ophthalmic solution Place 1 drop into both eyes 2 (two) times daily. 05/31/20  Yes [provider]  gabapentin (NEURONTIN) 100 MG capsule Take 2 capsules (200 mg total) by mouth 3 (three) times daily. 04/26/22 05/26/22 Yes Lonell Grandchild, MD  hydrochlorothiazide (HYDRODIURIL) 25 MG tablet Take 0.5 tablets (12.5 mg total) by mouth daily. 10/20/21  Yes Claiborne Rigg, NP  latanoprost (  XALATAN) 0.005 % ophthalmic solution Place 1 drop into both eyes at bedtime. 06/17/20  Yes [provider]  methocarbamol (ROBAXIN) 500 MG tablet Take 500 mg by mouth in the morning and at bedtime. 04/13/22  Yes [provider]  naproxen (NAPROSYN) 500 MG tablet Take 1 tablet (500 mg total) by mouth 2 (two) times daily. Patient taking differently: Take 500 mg by mouth daily as needed for moderate pain. 03/21/22  Yes Kommor, Madison, MD  Netarsudil Dimesylate 0.02 %  SOLN Place 1 drop into both eyes at bedtime. 02/07/21  Yes [provider]  tiZANidine (ZANAFLEX) 4 MG tablet Take 1 tablet (4 mg total) by mouth every 6 (six) hours as needed for muscle spasms. 10/20/21  Yes Claiborne RiggFleming, Zelda W, NP  dicyclomine (BENTYL) 20 MG tablet Take 1 tablet (20 mg total) by mouth 2 (two) times daily. Patient not taking: Reported on 04/26/2022 03/21/22   Kommor, Wyn ForsterMadison, MD  famotidine (PEPCID) 20 MG tablet Take 20 mg by mouth 2 (two) times daily. Patient not taking: Reported on 10/17/2021    [provider]  Misc. Devices (EXTENDABLE BEDSIDE RAIL) MISC ICD 10 Z61.09669.354 08/29/21   Claiborne RiggFleming, Zelda W, NP  traMADol (ULTRAM) 50 MG tablet Take 1 tablet (50 mg total) by mouth 3 (three) times daily as needed. Patient not taking: Reported on 10/17/2021 06/16/21   Ivin PootStanbery, Mary L, PA-C                                                                                                                                    Past Surgical History Past Surgical History:  Procedure Laterality Date   TUBAL LIGATION     Family History History reviewed. No pertinent family history.  Social History Social History   Tobacco Use   Smoking status: Former   Smokeless tobacco: Never  Substance Use Topics   Alcohol use: Never   Drug use: Never   Allergies Patient has no known allergies.  Review of Systems Review of Systems  All other systems reviewed and are negative.   Physical Exam Vital Signs  I have reviewed the triage vital signs BP (!) 122/107   Pulse 88   Temp 98 F (36.7 C)   Resp 17   SpO2 98%  Physical Exam Vitals and nursing note reviewed.  Constitutional:      General: She is not in acute distress.    Appearance: She is well-developed.  HENT:     Head: Normocephalic and atraumatic.     Mouth/Throat:     Mouth: Mucous membranes are moist.  Eyes:     Pupils: Pupils are equal, round, and reactive to light.  Cardiovascular:     Rate and Rhythm: Normal rate  and regular rhythm.     Heart sounds: No murmur heard. Pulmonary:     Effort: Pulmonary effort is normal. No respiratory distress.     Breath sounds: Normal breath  sounds.  Abdominal:     General: Abdomen is flat.     Palpations: Abdomen is soft.     Tenderness: There is no abdominal tenderness.  Musculoskeletal:        General: No tenderness.     Right lower leg: No edema.     Left lower leg: No edema.  Skin:    General: Skin is warm and dry.     Comments: No rashes  Neurological:     Mental Status: She is alert. Mental status is at baseline.     Comments: Cranial nerves II through XII intact, slightly decreased sensation to the left face.  4+ out of 5 strength in the left upper and lower extremities, 5 out of 5 strength in the right upper and lower extremities.  Mild decrease sensation to light touch on the left side as compared to the right side but intact.  Psychiatric:        Mood and Affect: Mood normal.        Behavior: Behavior normal.     ED Results and Treatments Labs (all labs ordered are listed, but only abnormal results are displayed) Labs Reviewed  BASIC METABOLIC PANEL - Abnormal; Notable for the following components:      Result Value   Potassium 3.4 (*)    Glucose, Bld 102 (*)    Calcium 8.8 (*)    All other components within normal limits  CBC  TROPONIN I (HIGH SENSITIVITY)                                                                                                                          Radiology CT Head Wo Contrast  Result Date: 04/26/2022 CLINICAL DATA:  left sided pain EXAM: CT HEAD WITHOUT CONTRAST TECHNIQUE: Contiguous axial images were obtained from the base of the skull through the vertex without intravenous contrast. RADIATION DOSE REDUCTION: This exam was performed according to the departmental dose-optimization program which includes automated exposure control, adjustment of the mA and/or kV according to patient size and/or use of iterative  reconstruction technique. COMPARISON:  CT head 03/21/2022 FINDINGS: Brain: No evidence of large-territorial acute infarction. No parenchymal hemorrhage. No mass lesion. No extra-axial collection. No mass effect or midline shift. No hydrocephalus. Basilar cisterns are patent. Vascular: No hyperdense vessel. Atherosclerotic calcifications are present within the cavernous internal carotid arteries. Skull: No acute fracture or focal lesion. Sinuses/Orbits: Paranasal sinuses and mastoid air cells are clear. The orbits are unremarkable. Other: None. IMPRESSION: No acute intracranial abnormality. Electronically Signed   By: Tish Frederickson M.D.   On: 04/26/2022 21:05   DG Chest 2 View  Result Date: 04/26/2022 CLINICAL DATA:  Chest pain. Left body burning sensation for 3 days with headache. EXAM: CHEST - 2 VIEW COMPARISON:  Radiographs 03/26/2021 and 03/05/2021. Abdominal CT 03/21/2022. FINDINGS: Suboptimal inspiration on both views. The heart size and mediastinal contours are stable with aortic atherosclerosis. Mild dependent atelectasis at both lung bases. No focal  airspace disease, edema, pleural effusion or pneumothorax. Mild degenerative changes are present in the thoracic spine. IMPRESSION: Suboptimal inspiration with mild dependent atelectasis at both lung bases. Electronically Signed   By: Carey Bullocks M.D.   On: 04/26/2022 12:11    Pertinent labs & imaging results that were available during my care of the patient were reviewed by me and considered in my medical decision making (see MDM for details).  Medications Ordered in ED Medications  gabapentin (NEURONTIN) capsule 300 mg (300 mg Oral Given 04/26/22 2113)                                                                                                                                     Procedures Procedures  (including critical care time)  Medical Decision Making / ED Course   MDM:  56 year old female presenting to the emergency  department with pain.  Patient overall well-appearing, neurologic exam consistent with chronic deficits from prior stroke.  Suspect most likely cause of patient's pain is neuropathic pain from old stroke.  Obtain CT scan to evaluate for new stroke, new intracranial bleeding, symptoms present for 3 days but no evidence of stroke on CT scan.  She does report that she has had this pain previously and was on gabapentin but her dose was reduced.  We will increase her dose to 200 mg 3 times daily.  Patient reports that she has a pain in the chest as well as the rest of her left side of her body, doubt ACS as symptoms would be extremely atypical and her EKG is reassuring with a negative troponin.  Considered shingles but no evidence of rash on exam.  Advise strict return precautions for development of rash as she may need antiviral therapy.      Additional history obtained: -External records from outside source obtained and reviewed including: Chart review including previous notes, labs, imaging, consultation notes including neurology note 05/03/22   Lab Tests: -I ordered, reviewed, and interpreted labs.   The pertinent results include:   Labs Reviewed  BASIC METABOLIC PANEL - Abnormal; Notable for the following components:      Result Value   Potassium 3.4 (*)    Glucose, Bld 102 (*)    Calcium 8.8 (*)    All other components within normal limits  CBC  TROPONIN I (HIGH SENSITIVITY)    Notable for normal troponin  EKG   EKG Interpretation  Date/Time:  Wednesday April 26 2022 11:30:53 EST Ventricular Rate:  71 PR Interval:  150 QRS Duration: 100 QT Interval:  386 QTC Calculation: 419 R Axis:   -20 Text Interpretation: Normal sinus rhythm Nonspecific T wave abnormality Abnormal ECG Confirmed by Gerhard Munch [phone]) on 04/26/2022 6:04:41 PM         Imaging Studies ordered: I ordered imaging studies including CT head On my interpretation imaging demonstrates no acute  process I independently visualized and interpreted imaging.  I agree with the radiologist interpretation   Medicines ordered and prescription drug management: Meds ordered this encounter  Medications   gabapentin (NEURONTIN) capsule 300 mg   gabapentin (NEURONTIN) 100 MG capsule    Sig: Take 2 capsules (200 mg total) by mouth 3 (three) times daily.    Dispense:  180 capsule    Refill:  0    -I have reviewed the patients home medicines and have made adjustments as needed   Social Determinants of Health:  Diagnosis or treatment significantly limited by social determinants of health: former smoker   Reevaluation: After the interventions noted above, I reevaluated the patient and found that they have improved  Co morbidities that complicate the patient evaluation  Past Medical History:  Diagnosis Date   Hypertension    Pancreatitis    Stage 3a chronic kidney disease (HCC)    Stroke (HCC)       Dispostion: Disposition decision including need for hospitalization was considered, and patient discharged from emergency department.    Final Clinical Impression(s) / ED Diagnoses Final diagnoses:  Neuropathic pain     This chart was dictated using voice recognition software.  Despite best efforts to proofread,  errors can occur which can change the documentation meaning.    Lonell Grandchild, MD 04/26/22 2137

## 2022-04-26 NOTE — ED Triage Notes (Signed)
Pt bib ems from home with reports of 3 days of burning sensation to L side of body along with headache. Hx CVA. Called PCP and was told to come to the ED for evaluation. Pt also with chest wall pain, worse with movement.  VSS with ems. 12 lead unremarkable.

## 2022-04-26 NOTE — ED Notes (Signed)
Pt cleared for PO by EDP [place]. Pt provided with ED sandwich bag and drink. Warm blanket given. Call bell within reach

## 2022-04-26 NOTE — Discharge Instructions (Signed)
We have for your left-sided pain.  Your symptoms are likely due to your old stroke.  Your CT scan did not show any signs of a new stroke or bleeding in your brain.  Your other testing was reassuring.  We have increased your gabapentin to 200 mg 3 times a day.  This can sometimes help with neuropathic pain.  Please follow-up with your neurologist.  Please return if you develop any new or worsening symptoms such as difficulty swallowing, speech changes, vision changes, inability to walk.  Please also return if you develop any new rashes.

## 2022-05-03 ENCOUNTER — Ambulatory Visit: Payer: Medicaid Other | Admitting: Neurology

## 2022-05-05 ENCOUNTER — Other Ambulatory Visit: Payer: Self-pay | Admitting: Nurse Practitioner

## 2022-05-05 DIAGNOSIS — I10 Essential (primary) hypertension: Secondary | ICD-10-CM

## 2022-05-08 NOTE — Telephone Encounter (Signed)
Called pt - Pt states she no longer goes to [facility].

## 2022-05-08 NOTE — Telephone Encounter (Signed)
Requested medications are due for refill today.  yes  Requested medications are on the active medications list.  yes  Last refill. 10/20/2021 #180 1 rf  Future visit scheduled.   no  Notes to clinic.  Called pt - pt she no longer goes to [facility].    Requested Prescriptions  Pending Prescriptions Disp Refills   carvedilol (COREG) 25 MG tablet [Pharmacy Med Name: carvedilol 25 mg tablet] 60 tablet 1    Sig: TAKE ONE TABLET BY MOUTH TWICE DAILY @ 9AM & 5PM     Cardiovascular: Beta Blockers 3 Failed - 05/05/2022  8:58 PM      Failed - Last BP in normal range    BP Readings from Last 1 Encounters:  04/26/22 (!) 127/95         Failed - Valid encounter within last 6 months    Recent Outpatient Visits           6 months ago Primary hypertension   [facility] [place], Shea Stakes, NP   9 months ago Hemiparesis affecting left side as late effect of cerebrovascular accident (CVA) (HCC)   [facility] [place], [place] W, NP   1 year ago Encounter to establish care   [facility] [place], Shea Stakes, NP   1 year ago Hemiparesis affecting left side as late effect of cerebrovascular accident (CVA) (HCC)   [facility] [place], [place], PA-C   1 year ago Primary hypertension   [facility] [address] [facility] [place], Cari S, [place]              Passed - Cr in normal range and within 360 days    Creatinine, Ser  Date Value Ref Range Status  04/26/2022 0.78 0.44 - 1.00 mg/dL Final         Passed - AST in normal range and within 360 days    AST  Date Value Ref Range Status  03/21/2022 25 15 - 41 U/L Final         Passed - ALT in normal range and within 360 days    ALT  Date Value Ref Range Status  03/21/2022 22 0 - 44 U/L Final         Passed - Last Heart Rate in normal range    Pulse Readings from Last 1 Encounters:  04/26/22 88

## 2022-05-09 NOTE — Telephone Encounter (Signed)
Noted, Pt no longer under [facility] care.

## 2022-05-11 ENCOUNTER — Telehealth: Payer: Self-pay

## 2022-05-11 NOTE — Telephone Encounter (Signed)
We received a referral for this patient to have colonoscopy, patient was seen in 2021 at [facility] gastroenterology for a screening colonoscopy but did not follow through with procedure. Patient is new to [facility] and wants to transfer care. Would you like to accept this patient?

## 2022-06-01 ENCOUNTER — Other Ambulatory Visit: Payer: Self-pay

## 2022-06-01 NOTE — Telephone Encounter (Signed)
Ok, please schedule next available appt.  

## 2022-06-14 ENCOUNTER — Encounter: Payer: Self-pay | Admitting: Gastroenterology

## 2022-08-23 NOTE — Progress Notes (Signed)
Shirley Hansen    FS:[id number]    [birth year]  Primary Care Physician:Stowe, Rachel Moulds, NP  Referring Physician: Cipriano Mile, NP [place],  [place] [id number]   Chief complaint:   Chief Complaint  Patient presents with   Colon Cancer Screening    Patient has never had a colonoscopy before.  She has no complaints at this time.    Gastroesophageal Reflux    She uses famotidine 20 mg twice daily.     HPI:  Shirley Hansen is a 57 y.o. female presenting to clinic today for consult for a screening colonoscopy and LLQ pain at the request of NP Cipriano Mile.   Patient was seen in ED on 03/21/22 for left lower quadrant abdominal pain. She was treated with Cefadroxil 500 mg and prescribed dicyclomine 2x a day. She has history of chronic kidney disease, hypertension, prior stroke   Today, she reports feeling well. She is not experiencing any symptoms today. She reports typically having a BM once a day in the morning. She states she's worried about having a colonoscopy due to not being able to tolerate the prep as she is in a wheelchair.    She denies any blood in stool, black stool, or abdominal pain.  She reports being compliant with her physical therapy having it 2x a week for 45 min.    She reports having had Covid recently which has caused her some pain that is unrelated to any GI issues.   She reports family history of lung cancer in her mother and father had prostate cancer.   GI Hx:  CT Abdomen Pelvis w contrast 03/21/22  1. 7 mm lower pole right renal calculus and mild right-sided hydroureter but no obstructing right ureteral calculus. Possible recently passed stone. 2. No other significant abdominal/pelvic findings, mass lesions or adenopathy. 3. Age advanced atherosclerotic calcifications involving the aorta and branch vessels. 4. Advanced degenerative disc disease at L3-4 and L4-5. 5. Aortic atherosclerosis    Current Outpatient Medications:     acetaminophen (TYLENOL) 325 MG tablet, Take 650 mg by mouth every 6 (six) hours as needed for mild pain., Disp: , Rfl:    albuterol (VENTOLIN HFA) 108 (90 Base) MCG/ACT inhaler, Inhale 2 puffs into the lungs every 2 (two) hours as needed for wheezing or shortness of breath., Disp: , Rfl:    amLODipine (NORVASC) 10 MG tablet, TAKE 1 TABLET BY MOUTH EVERY DAY, Disp: 90 tablet, Rfl: 0   Artificial Tear Solution (GENTEAL TEARS OP), Place 1 drop into both eyes daily as needed (dry eyes)., Disp: , Rfl:    aspirin EC 81 MG tablet, Take 81 mg by mouth daily. Swallow whole., Disp: , Rfl:    brimonidine (ALPHAGAN) 0.2 % ophthalmic solution, Place 1 drop into both eyes 2 (two) times daily., Disp: , Rfl:    carvedilol (COREG) 25 MG tablet, Take 1 tablet (25 mg total) by mouth 2 (two) times daily., Disp: 180 tablet, Rfl: 1   Cholecalciferol (VITAMIN D3) 50 MCG (2000 UT) capsule, Take 2,000 Units by mouth every morning., Disp: , Rfl:    diclofenac Sodium (VOLTAREN) 1 % GEL, Apply 1 application topically 4 (four) times daily as needed (pain)., Disp: , Rfl:    dorzolamide-timolol (COSOPT) 22.3-6.8 MG/ML ophthalmic solution, Place 1 drop into both eyes 2 (two) times daily., Disp: , Rfl:    escitalopram (LEXAPRO) 5 MG tablet, Take 1 tablet by mouth daily., Disp: , Rfl:  famotidine (PEPCID) 20 MG tablet, Take 20 mg by mouth 2 (two) times daily., Disp: , Rfl:    gabapentin (NEURONTIN) 100 MG capsule, Take 100 mg by mouth 3 (three) times daily., Disp: , Rfl:    latanoprost (XALATAN) 0.005 % ophthalmic solution, Place 1 drop into both eyes at bedtime., Disp: , Rfl:    methocarbamol (ROBAXIN) 500 MG tablet, Take 500 mg by mouth in the morning and at bedtime., Disp: , Rfl:    Misc. Devices (EXTENDABLE BEDSIDE RAIL) MISC, ICD 10 I69.354, Disp: 1 each, Rfl: 0   Netarsudil Dimesylate 0.02 % SOLN, Place 1 drop into both eyes at bedtime., Disp: , Rfl:    tiZANidine (ZANAFLEX) 4 MG tablet, Take 1 tablet (4 mg total) by mouth  every 6 (six) hours as needed for muscle spasms., Disp: 60 tablet, Rfl: 2   atorvastatin (LIPITOR) 80 MG tablet, Take 1 tablet (80 mg total) by mouth at bedtime., Disp: 90 tablet, Rfl: 1   Allergies as of 08/25/2022   (No Known Allergies)    Past Medical History:  Diagnosis Date   Hypertension    Pancreatitis    Stage 3a chronic kidney disease ([place])    Stroke ([facility])     Past Surgical History:  Procedure Laterality Date   CORONARY ANGIOPLASTY WITH STENT PLACEMENT     TUBAL LIGATION      Family History  Problem Relation Age of Onset   Lung cancer Mother    Prostate cancer Father    Congestive Heart Failure Father    Colon cancer Neg Hx    Colon polyps Neg Hx    Esophageal cancer Neg Hx    Pancreatic cancer Neg Hx    Stomach cancer Neg Hx     Social History   Socioeconomic History   Marital status: Single    Spouse name: Not on file   Number of children: Not on file   Years of education: Not on file   Highest education level: Not on file  Occupational History   Not on file  Tobacco Use   Smoking status: Former    Types: Cigarettes   Smokeless tobacco: Never  Vaping Use   Vaping Use: Never used  Substance and Sexual Activity   Alcohol use: Not Currently   Drug use: Never   Sexual activity: Not on file  Other Topics Concern   Not on file  Social History Narrative   Not on file   Social Determinants of Health   Financial Resource Strain: Not on file  Food Insecurity: Not on file  Transportation Needs: Not on file  Physical Activity: Not on file  Stress: Not on file  Social Connections: Not on file  Intimate Partner Violence: Not on file      Review of systems: Review of Systems  Cardiovascular:  Positive for leg swelling.  Gastrointestinal:  Negative for abdominal pain, blood in stool and constipation.  Genitourinary:  Positive for frequency.  Musculoskeletal:  Positive for joint swelling.  Psychiatric/Behavioral:  Positive for sleep disturbance.         +depression       Physical Exam: Vitals:   08/25/22 1441  BP: 104/74  Pulse: 85  SpO2: 96%   Body mass index is 31.93 kg/m.  General: well-appearing, in wheelchair, left side weakness Eyes: sclera anicteric, no redness Skin; warm and dry, no rash or jaundice noted Neuro: awake, alert and oriented x 3. Normal gross motor function and fluent speech   Data Reviewed:  Reviewed labs, radiology imaging, old records and pertinent past GI work up   Assessment and Plan/Recommendations: 57 year old very pleasant female with history of CVA with left hemiplegia is here to discuss colorectal cancer screening She has no specific GI symptoms Patient opted to proceed with ColoGuard, she is average risk.  Explained to patient that if Cologuard is negative we will repeat in 3 years but if positive will need colonoscopy for follow-up  Return as needed  This visit required 30 minutes of patient care (this includes precharting, chart review, review of results, face-to-face time used for counseling as well as treatment plan and follow-up. The patient was provided an opportunity to ask questions and all were answered. The patient agreed with the plan and demonstrated an understanding of the instructions.   I,Safa M Kadhim,acting as a scribe for Harl Bowie, MD.,have documented all relevant documentation on the behalf of Harl Bowie, MD,as directed by  Harl Bowie, MD while in the presence of Harl Bowie, MD.   I, Harl Bowie, MD, have reviewed all documentation for this visit. The documentation on 08/25/22 for the exam, diagnosis, procedures, and orders are all accurate and complete.    Damaris Hippo , MD  CC: Cipriano Mile, NP

## 2022-08-25 ENCOUNTER — Encounter: Payer: Self-pay | Admitting: Gastroenterology

## 2022-08-25 ENCOUNTER — Ambulatory Visit (INDEPENDENT_AMBULATORY_CARE_PROVIDER_SITE_OTHER): Payer: 59 | Admitting: Gastroenterology

## 2022-08-25 VITALS — BP 104/74 | HR 85 | Ht 64.0 in | Wt 186.0 lb

## 2022-08-25 DIAGNOSIS — Z1211 Encounter for screening for malignant neoplasm of colon: Secondary | ICD-10-CM

## 2022-08-25 NOTE — Patient Instructions (Signed)
Your provider has ordered Cologuard testing as an option for colon cancer screening. This is performed by [employer] [facility] and may be out of network with your insurance. PRIOR to completing the test, it is YOUR responsibility to contact your insurance about covered benefits for this test. Your out of pocket expense could be anywhere from $0.00 to $649.00.   When you call to check coverage with your insurer, please provide the following information:   -The ONLY provider of Cologuard is [place] code for Cologuard is [phone].  [employer] NPI # [id number]  -[employer] Tax ID # [id number]   We have already sent your demographic and insurance information to [employer] [facility] (phone number [phone]) and they should contact you within the next week regarding your test. If you have not heard from them within the next week, please call our office at [phone].    Thank you for choosing [facility]  Karleen Hampshire Nandigam,MD

## 2022-10-31 ENCOUNTER — Other Ambulatory Visit: Payer: Self-pay | Admitting: Student

## 2022-10-31 DIAGNOSIS — Z1231 Encounter for screening mammogram for malignant neoplasm of breast: Secondary | ICD-10-CM

## 2022-11-03 ENCOUNTER — Other Ambulatory Visit: Payer: Self-pay | Admitting: Student

## 2022-11-03 DIAGNOSIS — M25512 Pain in left shoulder: Secondary | ICD-10-CM

## 2022-11-10 ENCOUNTER — Other Ambulatory Visit: Payer: Self-pay | Admitting: Student

## 2022-11-10 DIAGNOSIS — M25512 Pain in left shoulder: Secondary | ICD-10-CM

## 2022-11-28 ENCOUNTER — Ambulatory Visit: Payer: 59

## 2022-12-07 ENCOUNTER — Ambulatory Visit
Admission: RE | Admit: 2022-12-07 | Discharge: 2022-12-07 | Disposition: A | Discharge status: Home / Self Care | Payer: 59 | Source: Ambulatory Visit | Attending: Student | Admitting: Student

## 2022-12-07 DIAGNOSIS — M25512 Pain in left shoulder: Secondary | ICD-10-CM

## 2022-12-13 ENCOUNTER — Ambulatory Visit
Admission: RE | Admit: 2022-12-13 | Discharge: 2022-12-13 | Disposition: A | Discharge status: Home / Self Care | Payer: 59 | Source: Ambulatory Visit | Attending: Student | Admitting: Student

## 2022-12-13 DIAGNOSIS — Z1231 Encounter for screening mammogram for malignant neoplasm of breast: Secondary | ICD-10-CM

## 2023-01-24 ENCOUNTER — Other Ambulatory Visit: Payer: Self-pay | Admitting: Nurse Practitioner

## 2023-01-24 DIAGNOSIS — M545 Low back pain, unspecified: Secondary | ICD-10-CM

## 2023-03-26 ENCOUNTER — Encounter: Payer: Self-pay | Admitting: Neurology

## 2023-03-26 ENCOUNTER — Ambulatory Visit: Payer: Medicaid Other | Admitting: Neurology

## 2023-04-05 HISTORY — PX: OTHER SURGICAL HISTORY: SHX169

## 2023-04-23 ENCOUNTER — Other Ambulatory Visit (HOSPITAL_COMMUNITY): Payer: Self-pay | Admitting: *Deleted

## 2023-04-23 DIAGNOSIS — R1310 Dysphagia, unspecified: Secondary | ICD-10-CM

## 2023-05-04 IMAGING — MR MR SHOULDER*L* W/O CM
4 of 6 series · 20 of 40 positions shown · non-contrast
Comparison: None.

CLINICAL DATA: Left shoulder pain. Decreased range of motion.

EXAM:
MRI OF THE LEFT SHOULDER WITHOUT CONTRAST
TECHNIQUE: Multiplanar, multisequence MR imaging of the shoulder was performed.
No intravenous contrast was administered.

[Series 6: T2 fat-sat · axial · left · 3.0mm · 0.47mm/px · z∈[-73,+27]mm · 7 of 27 slices shown (1 of 3)]
[im 1/27]
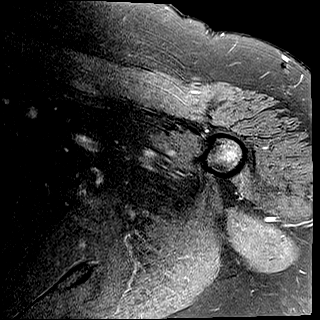
[im 5/27]
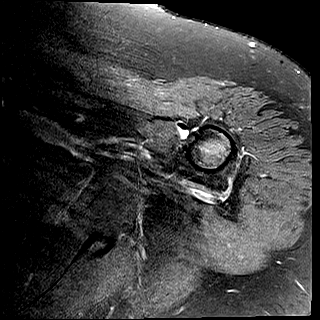
[im 9/27]
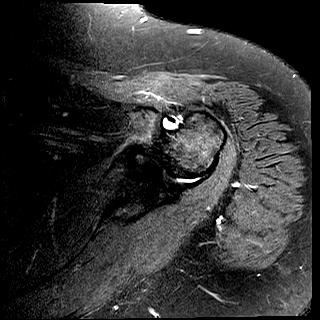
[im 14/27]
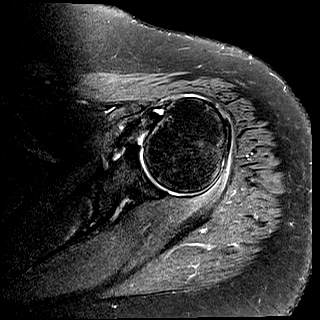
[im 18/27]
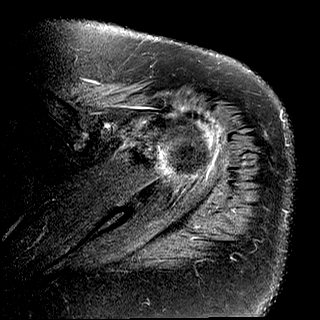
[im 22/27]
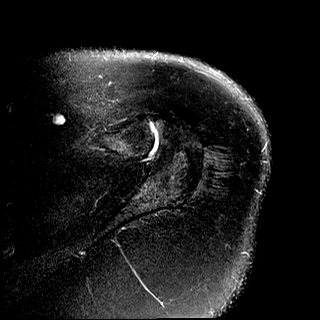
[im 27/27]
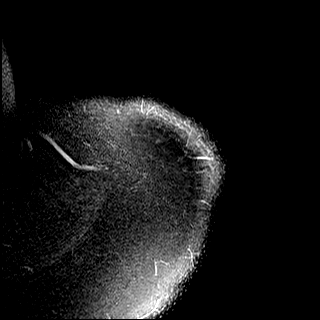

[Series 7: T2 fat-sat · oblique · left · 4.0mm · 0.22mm/px · 4 of 21 slices shown (2 of 3)]
[im 1/21]
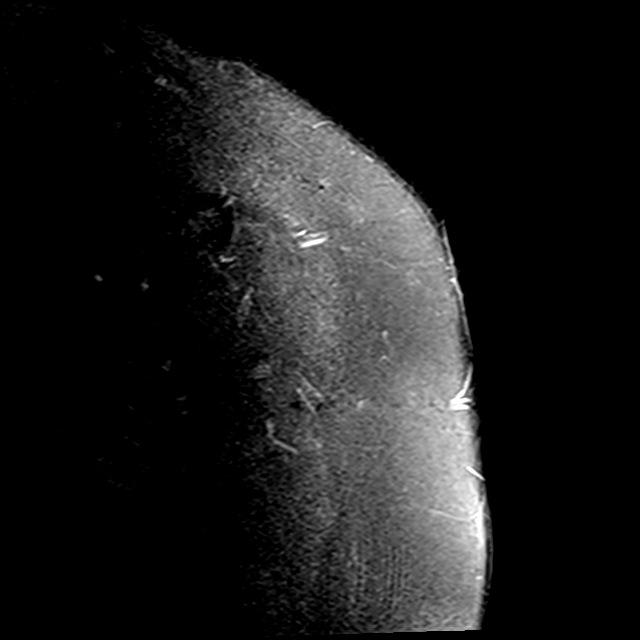
[im 5/21]
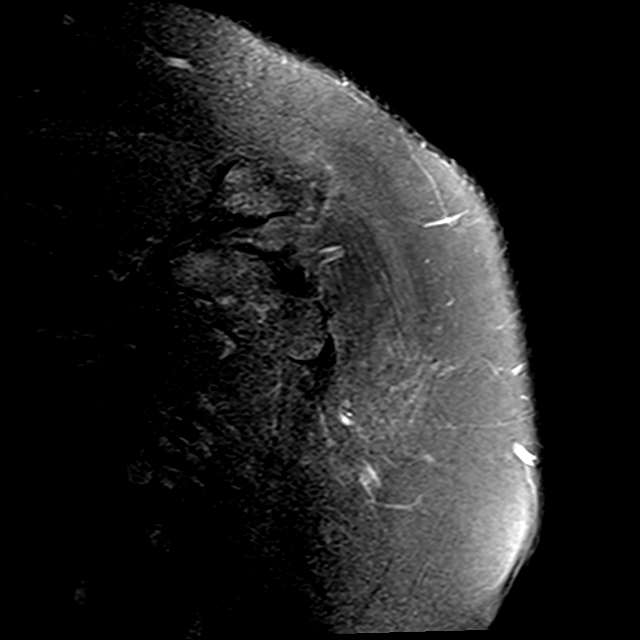
[im 13/21]
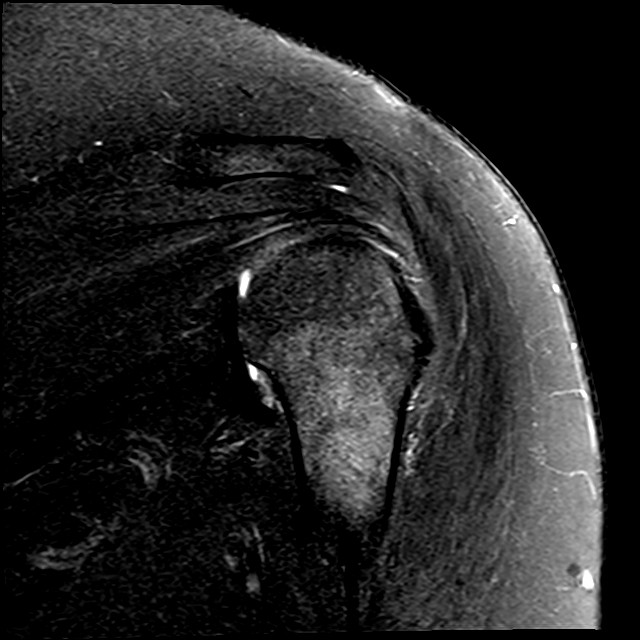
[im 21/21]
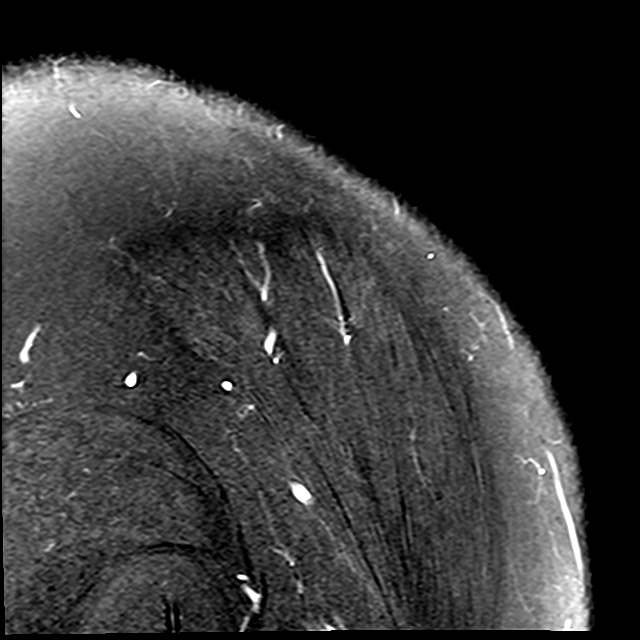

[Series 8: PD · oblique · left · 4.0mm · 0.22mm/px · 6 of 21 slices shown]
[im 1/21]
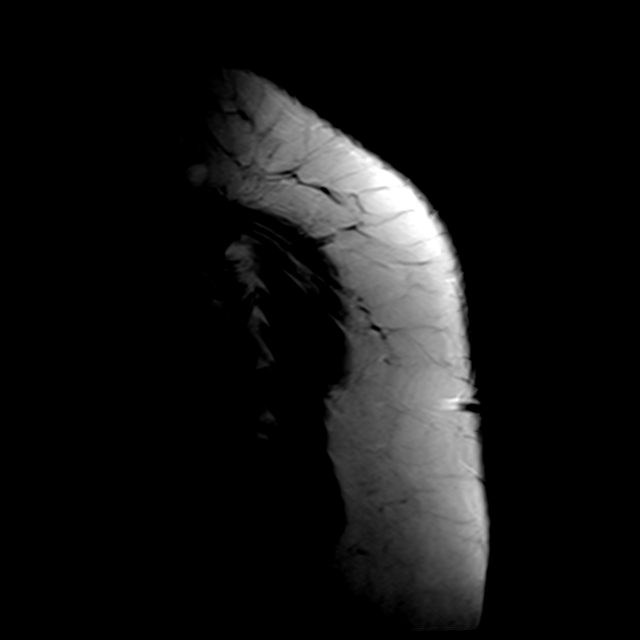
[im 5/21]
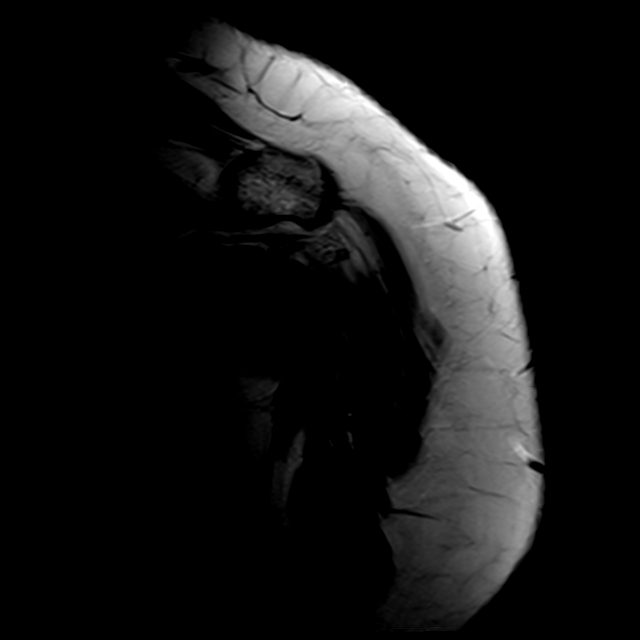
[im 9/21]
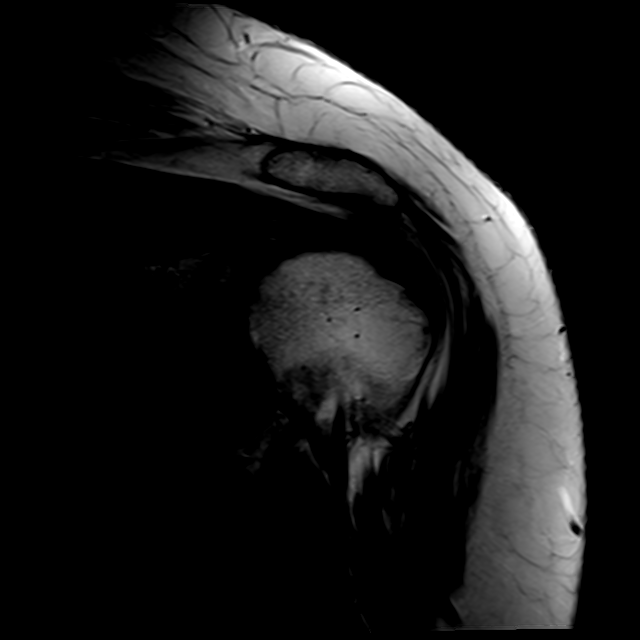
[im 13/21]
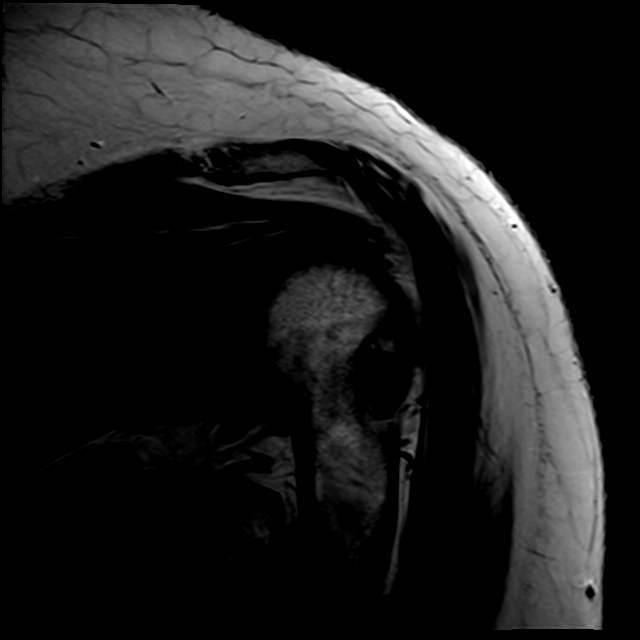
[im 17/21]
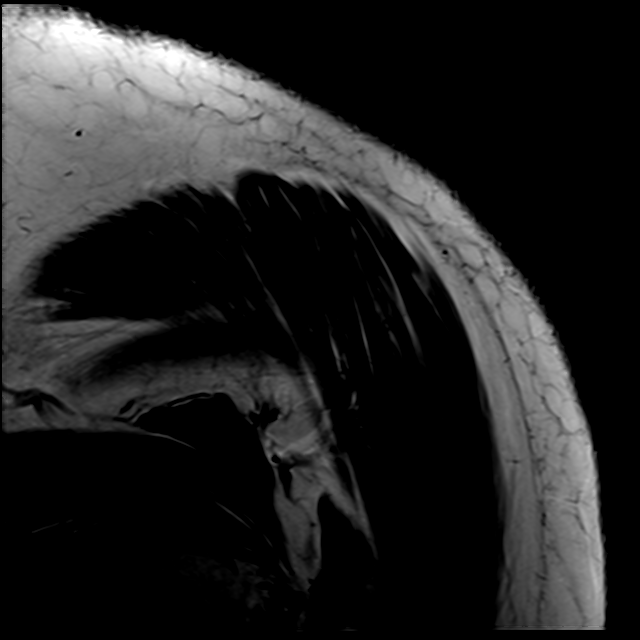
[im 21/21]
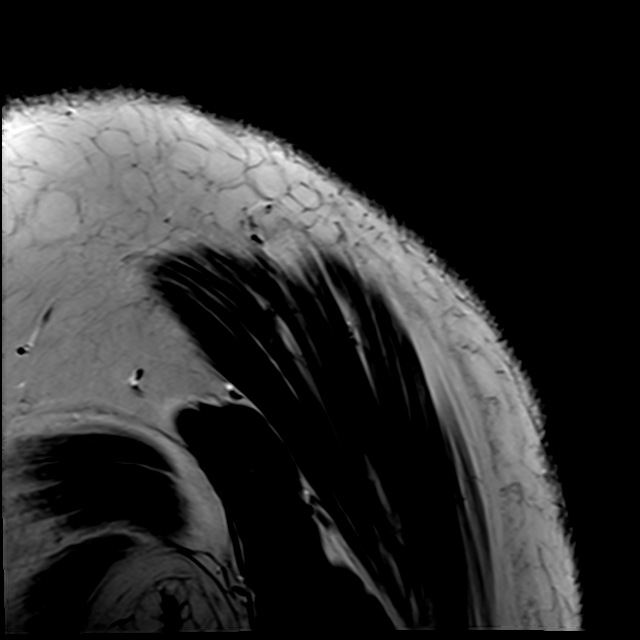

[Series 9: T2 fat-sat · oblique · left · 4.0mm · 0.44mm/px · 3 of 23 slices shown (3 of 3)]
[im 4/23]
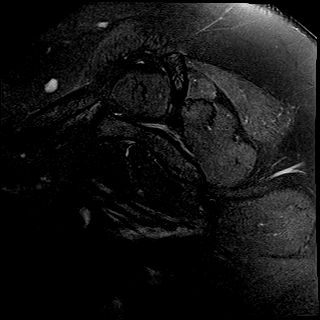
[im 12/23]
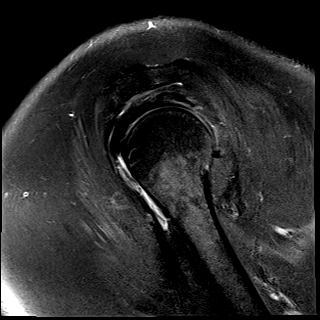
[im 19/23]
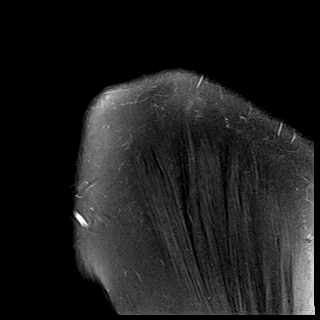

[20 of 40 positions shown; findings below may reference images not displayed]

FINDINGS: Rotator cuff: Mild tendinosis of the supraspinatus tendon. Moderate
tendinosis of the infraspinatus tendon. Teres minor tendon is
intact. Subscapularis tendon is intact.

Muscles: No muscle atrophy or edema. No intramuscular fluid
collection or hematoma.

Biceps Long Head: Mild tendinosis of the intra-articular portion of
the long head of the biceps tendon.

Acromioclavicular Joint: Mild arthropathy of the acromioclavicular
joint. No subacromial/subdeltoid bursal fluid.

Glenohumeral Joint: No joint effusion. No chondral defect.

Labrum: Grossly intact, but evaluation is limited by lack of
intraarticular fluid/contrast.

Bones: No fracture or dislocation. No aggressive osseous lesion.

Other: No fluid collection or hematoma.
IMPRESSION: 1. Mild tendinosis of the supraspinatus tendon.
2. Moderate tendinosis of the infraspinatus tendon.
3. Mild tendinosis of the intra-articular portion of the long head
of the biceps tendon.

## 2023-05-07 ENCOUNTER — Ambulatory Visit (HOSPITAL_COMMUNITY)
Admission: RE | Admit: 2023-05-07 | Discharge: 2023-05-07 | Disposition: A | Discharge status: Home / Self Care | Payer: 59 | Source: Ambulatory Visit | Attending: *Deleted | Admitting: *Deleted

## 2023-05-07 ENCOUNTER — Ambulatory Visit (HOSPITAL_COMMUNITY)
Admission: RE | Admit: 2023-05-07 | Discharge: 2023-05-07 | Disposition: A | Discharge status: Home / Self Care | Payer: 59 | Source: Ambulatory Visit | Attending: Student

## 2023-05-07 DIAGNOSIS — R1310 Dysphagia, unspecified: Secondary | ICD-10-CM | POA: Diagnosis not present

## 2023-05-07 DIAGNOSIS — Z8673 Personal history of transient ischemic attack (TIA), and cerebral infarction without residual deficits: Secondary | ICD-10-CM | POA: Insufficient documentation

## 2023-05-07 DIAGNOSIS — R4702 Dysphasia: Secondary | ICD-10-CM | POA: Diagnosis present

## 2023-05-07 NOTE — Evaluation (Signed)
Modified Barium Swallow Study  Patient Details  Name: Shirley Hansen MRN: [id number] Date of Birth: 1966-03-01  Today's Date: 05/07/2023  Modified Barium Swallow completed.  Full report located under Chart Review in the Imaging Section.  History of Present Illness Pt is a 57 yo female presenting for OP MBS due to reports of pills being stuck. She says that she has been having dysphonia and globus sensation since stroke in 2020. She had an MBS at [facility] previously that she believes was WNL. She had one appointment with SLP (for voice), but then she had to cancel her next one and never rescheduled. She thinks that her symptoms are "a little bit" worse than when she was last seen almost a year ago. She initially denies any trouble swallowing liquids or solids (other than pills), but then does endorse that sometimes things feel like they are coming back up. 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 She has complete airway protection and pharyngeal clearance. Note that after pill administration, pt felt like the barium tablet was lodged in her throat on her L side, but the pill was observed to clear the pharynx as well as the esophagus. Pt does endorse some additional subjective, more esophageal symptoms (food/liquid coming back up intermittently) so a more dedicated assessment of the esophagus could be considered. Also considering pt's reports of mildly worsening dysphonia and inability to complete SLP as recommended before, she is interested in f/u with ENT and SLP if referring MD in agreement. Factors that may increase risk of adverse event in presence of aspiration [employer]ye Oaks [employer] Clearance Coots 2021): Respiratory or GI disease  Swallow Evaluation Recommendations Recommendations: PO diet PO Diet Recommendation: Regular;Thin liquids (Level 0) Liquid Administration via: Cup;Straw Medication Administration:  Whole meds with liquid (discussed trying them in purees PRN for comfort) Supervision: Patient able to self-feed Swallowing strategies  : Slow rate;Small bites/sips;Follow solids with liquids Postural changes: Position pt fully upright for meals;Stay upright 30-60 min after meals Oral care recommendations: Oral care BID (2x/day) Recommended consults: Consider ENT consultation      Mahala Menghini., M.A. CCC-SLP Acute Rehabilitation Services Office [phone]  Secure chat preferred  05/07/2023,12:48 PM

## 2023-07-17 ENCOUNTER — Encounter: Payer: Self-pay | Admitting: Neurology

## 2023-07-17 ENCOUNTER — Ambulatory Visit (INDEPENDENT_AMBULATORY_CARE_PROVIDER_SITE_OTHER): Payer: 59 | Admitting: Neurology

## 2023-07-17 VITALS — BP 119/77 | HR 81 | Ht 65.0 in

## 2023-07-17 DIAGNOSIS — R269 Unspecified abnormalities of gait and mobility: Secondary | ICD-10-CM | POA: Diagnosis not present

## 2023-07-17 DIAGNOSIS — G890 Central pain syndrome: Secondary | ICD-10-CM

## 2023-07-17 DIAGNOSIS — I69354 Hemiplegia and hemiparesis following cerebral infarction affecting left non-dominant side: Secondary | ICD-10-CM

## 2023-07-17 DIAGNOSIS — I6389 Other cerebral infarction: Secondary | ICD-10-CM | POA: Diagnosis not present

## 2023-07-17 MED ORDER — DULOXETINE HCL 30 MG PO CPEP: 30.0000 mg | ORAL_CAPSULE | Freq: Every day | ORAL | 6 refills | Status: DC

## 2023-07-17 NOTE — Progress Notes (Signed)
 [facility]  PATIENT: Shirley Hansen DOB: 11-12-1965  REQUESTING CLINICIAN: Theotis Haze ORN, NP HISTORY FROM: Patient  REASON FOR VISIT: Stroke   HISTORICAL  CHIEF COMPLAINT:  Chief Complaint  Patient presents with   Follow-up    Rm 12. Alone. She reports neuropathic pain in her face and left foot.   INTERVAL HISTORY 07/16/2022 Patient presents today for follow up, last visit was in November 2022. Since then, she has been ok. 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 She has been doing physical therapy with some good results. Today, she tells me that her main issue is her left side burning pain. It starts from the left side of face, then left side of body including arm and leg. The pain is intermittent, describes it as burning pain, sometimes, she also complaints of temperature changes on the left side of body.  She still has gait abnormalities, uses a cane or walker at home but stated that she did better when she was using a hemi walker.   HISTORY OF PRESENT ILLNESS:  This is a 58 year old woman with past medical history of stroke with left-sided residual deficit in 2020, hypertension, hyperlipidemia and muscle spasm who is presenting to establish care for her history of strokes.  Patient stated stroke happened in 2020, etiology of the stroke was hypertensive bleed, she had bleeding in the right coronary radiata/thalamus.  Since then, she does have chronic left-sided weakness.  She reported on September 3 she fell and had difficulty getting up since then she has worsening shoulder pain, she set up to have a shoulder MRI done by orthopedics.  She does also have history of muscle spasm, has tried Robaxin , Flexeril , gabapentin  and at on Oct 28, she presented to the ED with hallucination felt to be secondary to her muscle relaxants, mostly Flexeril  which was discontinued.  During this time also she has been completing physical therapy, she is finished last week.  Patient  said that she is able to walk with a walker no recent falls.  She is compliant with the medication.   OTHER MEDICAL CONDITIONS: Stroke with left sided deficit in 2020, HTN, HLD, muscle spasms    REVIEW OF SYSTEMS: Full 14 system review of systems performed and negative with exception of: as noted in the HPI   ALLERGIES: No Known Allergies  HOME MEDICATIONS: Outpatient Medications Prior to Visit  Medication Sig Dispense Refill   acetaminophen  (TYLENOL ) 325 MG tablet Take 650 mg by mouth every 6 (six) hours as needed for mild pain.     albuterol  (VENTOLIN  HFA) 108 (90 Base) MCG/ACT inhaler Inhale 2 puffs into the lungs every 2 (two) hours as needed for wheezing or shortness of breath.     amLODipine  (NORVASC ) 10 MG tablet TAKE 1 TABLET BY MOUTH EVERY DAY 90 tablet 0   atorvastatin  (LIPITOR ) 80 MG tablet Take 1 tablet (80 mg total) by mouth at bedtime. 90 tablet 1   carvedilol  (COREG ) 25 MG tablet Take 1 tablet (25 mg total) by mouth 2 (two) times daily. 180 tablet 1   celecoxib (CELEBREX) 100 MG capsule Take 100 mg by mouth 2 (two) times daily.     Cholecalciferol  (VITAMIN D3) 50 MCG (2000 UT) capsule Take 2,000 Units by mouth every morning.     diclofenac Sodium (VOLTAREN) 1 % GEL Apply 1 application topically 4 (four) times daily as needed (pain).     docusate sodium  (COLACE) 100 MG capsule Take 100 mg by mouth 2 (two) times  daily.     dorzolamide -timolol  (COSOPT ) 22.3-6.8 MG/ML ophthalmic solution Place 1 drop into both eyes 2 (two) times daily.     gabapentin  (NEURONTIN ) 100 MG capsule Take 100 mg by mouth 3 (three) times daily.     lidocaine  (LIDODERM ) 5 % Place 1 patch onto the skin daily. Remove & Discard patch within 12 hours or as directed by MD     Netarsudil  Dimesylate 0.02 % SOLN Place 1 drop into both eyes at bedtime.     pregabalin  (LYRICA ) 150 MG capsule Take 150 mg by mouth daily.     pregabalin  (LYRICA ) 200 MG capsule Take 200 mg by mouth daily.     tiZANidine  (ZANAFLEX ) 4  MG tablet Take 1 tablet (4 mg total) by mouth every 6 (six) hours as needed for muscle spasms. 60 tablet 2   Artificial Tear Solution (GENTEAL TEARS OP) Place 1 drop into both eyes daily as needed (dry eyes).     aspirin  EC 81 MG tablet Take 81 mg by mouth daily. Swallow whole.     brimonidine  (ALPHAGAN ) 0.2 % ophthalmic solution Place 1 drop into both eyes 2 (two) times daily.     escitalopram  (LEXAPRO ) 5 MG tablet Take 1 tablet by mouth daily.     famotidine  (PEPCID ) 20 MG tablet Take 20 mg by mouth 2 (two) times daily.     latanoprost  (XALATAN ) 0.005 % ophthalmic solution Place 1 drop into both eyes at bedtime.     methocarbamol  (ROBAXIN ) 500 MG tablet Take 500 mg by mouth in the morning and at bedtime.     Misc. Devices (EXTENDABLE BEDSIDE RAIL) MISC ICD 10 I69.354 1 each 0   No facility-administered medications prior to visit.    PAST MEDICAL HISTORY: Past Medical History:  Diagnosis Date   Hypertension    Pancreatitis    Stage 3a chronic kidney disease (HCC)    Stroke (HCC)     PAST SURGICAL HISTORY: Past Surgical History:  Procedure Laterality Date   cataract surgery Left 04/05/2023   OD   CORONARY ANGIOPLASTY WITH STENT PLACEMENT     TUBAL LIGATION      FAMILY HISTORY: Family History  Problem Relation Age of Onset   Lung cancer Mother    Prostate cancer Father    Congestive Heart Failure Father    Colon cancer Neg Hx    Colon polyps Neg Hx    Esophageal cancer Neg Hx    Pancreatic cancer Neg Hx    Stomach cancer Neg Hx     SOCIAL HISTORY: Social History   Socioeconomic History   Marital status: Single    Spouse name: Not on file   Number of children: Not on file   Years of education: Not on file   Highest education level: Not on file  Occupational History   Not on file  Tobacco Use   Smoking status: Former    Types: Cigarettes   Smokeless tobacco: Never  Vaping Use   Vaping status: Never Used  Substance and Sexual Activity   Alcohol  use: Not  Currently   Drug use: Never   Sexual activity: Not on file  Other Topics Concern   Not on file  Social History Narrative   Not on file   Social Drivers of Health   Financial Resource Strain: Low Risk  (08/26/2022)   Received from [facility], [facility]   Overall Financial Resource Strain (CARDIA)    Difficulty of Paying Living Expenses: Not  hard at all  Food Insecurity: No Food Insecurity (08/26/2022)   Received from [facility], [facility]   Hunger Vital Sign    Worried About Running Out of Food in the Last Year: Never true    Ran Out of Food in the Last Year: Never true  Transportation Needs: No Transportation Needs (08/26/2022)   Received from [facility], [facility]   [facility] - Transportation    In the past 12 months, has lack of transportation kept you from medical appointments or from getting medications?: No    Lack of Transportation (Non-Medical): No  Physical Activity: Inactive (10/06/2020)   Received from [facility]   Exercise Vital Sign  Stress: No Stress Concern Present (10/06/2020)   Received from [facility], [facility]   [employer] [facility] [employer]ational [employer] - Occupational Stress Questionnaire    Feeling of Stress : Only a little  Social Connections: Moderately Isolated (10/06/2020)   Received from [facility], [facility]   Social Connection and Isolation Panel [NHANES]    Frequency of Communication with Friends and Family: More than three times a week    Frequency of Social Gatherings with Friends and Family: Three times a week    Attends Religious Services: More than 4 times per year    Active Member of Clubs or Organizations: No    Attends Banker Meetings: Never    Marital Status: Divorced  Catering Manager Violence: Not on file     PHYSICAL  EXAM  GENERAL EXAM/CONSTITUTIONAL: Vitals:  Vitals:   07/17/23 1448  BP: 119/77  Pulse: 81  Height: 5' 5 (1.651 m)   Body mass index is 30.95 kg/m. Wt Readings from Last 3 Encounters:  08/25/22 186 lb (84.4 kg)  10/17/21 193 lb (87.5 kg)  05/03/21 187 lb (84.8 kg)   Patient is in no distress; well developed, nourished and groomed; neck is supple  MUSCULOSKELETAL: Gait, strength, tone, movements noted in Neurologic exam below  NEUROLOGIC: MENTAL STATUS:      No data to display         awake, alert, oriented to person, place and time recent and remote memory intact normal attention and concentration language fluent, comprehension intact, naming intact fund of knowledge appropriate  CRANIAL NERVE:  2nd, 3rd, 4th, 6th - pupils equal and reactive to light, visual fields full to confrontation, extraocular muscles intact, no nystagmus 5th - facial sensation symmetric 7th - facial strength symmetric 8th - hearing intact 9th - palate elevates symmetrically, uvula midline 11th - shoulder shrug symmetric 12th - tongue protrusion midline  MOTOR:  normal bulk and tone, full strength in the right UE, and LE. There is weakness 4/5 in the LUE and LLE.   Report normal sensation throughout   COORDINATION:  finger-nose-finger is normal with the right and slightly dysmetric on the left.   REFLEXES:  deep tendon reflexes present and symmetric  GAIT/STATION:  Uses a wheelchair today, but able to stand without assistance.     DIAGNOSTIC DATA (LABS, IMAGING, TESTING) - I reviewed patient records, labs, notes, testing and imaging myself where available.  Lab Results  Component Value Date   WBC 7.8 04/26/2022   HGB 12.7 04/26/2022   HCT 38.0 04/26/2022   MCV 86.6 04/26/2022   PLT 378 04/26/2022      Component Value Date/Time   NA 140 04/26/2022 1155   NA 144  10/17/2021 1603   K 3.4 (L) 04/26/2022 1155   CL 101 04/26/2022 1155   CO2 27 04/26/2022 1155   GLUCOSE 102  (H) 04/26/2022 1155   BUN 8 04/26/2022 1155   BUN 7 10/17/2021 1603   CREATININE 0.78 04/26/2022 1155   CALCIUM  8.8 (L) 04/26/2022 1155   PROT 8.0 03/21/2022 0427   PROT 7.6 10/17/2021 1603   ALBUMIN  4.0 03/21/2022 0427   ALBUMIN  4.3 10/17/2021 1603   AST 25 03/21/2022 0427   ALT 22 03/21/2022 0427   ALKPHOS 92 03/21/2022 0427   BILITOT 0.5 03/21/2022 0427   BILITOT 0.4 10/17/2021 1603   GFRNONAA >60 04/26/2022 1155   No results found for: CHOL, HDL, LDLCALC, LDLDIRECT, TRIG, CHOLHDL No results found for: YHAJ8R No results found for: VITAMINB12 Lab Results  Component Value Date   TSH 1.330 04/04/2021    MRI Brain 03/05/2021 No evidence of acute intracranial abnormality. Redemonstrated sequela of remote hemorrhage within the right corona radiata/thalamus. Redemonstrated chronic small-vessel infarcts within the bilateral corona radiata, left basal ganglia, left thalamus and within the pons. 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 Mild generalized parenchymal atrophy    ASSESSMENT AND PLAN  58 y.o. 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  She still has sequelae from her strokes including mainly left hemiplegia. She is set to restart PT, I think it is reasonable for PT to work up with patient using a hemi walker since she reports doing the best when she used the hemi-walker.  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 I will try her on Pregabalin  and patient will contact her if symptoms are not controlled. Continue your other medications, continue to follow up with PCP, continue with  PT and return in 6 months or sooner if worse.    1. Cerebrovascular accident (CVA) due to other mechanism (HCC)   2. Hemiparesis affecting left side as late effect of cerebrovascular accident (CVA) (HCC)   3. Dejerine Roussy syndrome   4. Gait abnormality    Patient Instructions  Continue current medications  Start Duloxetine  for neuropathic pain (Dejerine syndrome) from her stroke  Ok to use hemi walker during PT Return in 6 months or sooner if worse     No orders of the defined types were placed in this encounter.    Meds ordered this encounter  Medications   DULoxetine  (CYMBALTA ) 30 MG capsule    Sig: Take 1 capsule (30 mg total) by mouth daily.    Dispense:  30 capsule    Refill:  6    Return in about 6 months (around 01/14/2024).    Pastor Falling, MD 07/22/2023, 2:58 PM  [facility] [address] [place], [place] [address] [phone]

## 2023-07-17 NOTE — Patient Instructions (Addendum)
 Continue current medications  Start Duloxetine  for neuropathic pain (Dejerine syndrome) from her stroke  Ok to use hemi walker during PT Return in 6 months or sooner if worse

## 2023-07-30 ENCOUNTER — Emergency Department (HOSPITAL_COMMUNITY): Payer: 59

## 2023-07-30 ENCOUNTER — Emergency Department (HOSPITAL_COMMUNITY)
Admission: EM | Admit: 2023-07-30 | Discharge: 2023-07-30 | Disposition: A | Discharge status: Home / Self Care | Payer: 59 | Attending: Emergency Medicine | Admitting: Emergency Medicine

## 2023-07-30 ENCOUNTER — Other Ambulatory Visit: Payer: Self-pay

## 2023-07-30 DIAGNOSIS — R519 Headache, unspecified: Secondary | ICD-10-CM | POA: Diagnosis present

## 2023-07-30 DIAGNOSIS — I10 Essential (primary) hypertension: Secondary | ICD-10-CM | POA: Insufficient documentation

## 2023-07-30 DIAGNOSIS — J069 Acute upper respiratory infection, unspecified: Secondary | ICD-10-CM | POA: Insufficient documentation

## 2023-07-30 DIAGNOSIS — Z79899 Other long term (current) drug therapy: Secondary | ICD-10-CM | POA: Diagnosis not present

## 2023-07-30 LAB — CBC WITH DIFFERENTIAL/PLATELET
Abs Immature Granulocytes: 0.02 10*3/uL (ref 0.00–0.07)
Basophils Absolute: 0.1 10*3/uL (ref 0.0–0.1)
Basophils Relative: 1 %
Eosinophils Absolute: 0.1 10*3/uL (ref 0.0–0.5)
Eosinophils Relative: 1 %
HCT: 47.5 % — ABNORMAL HIGH (ref 36.0–46.0)
Hemoglobin: 15.7 g/dL — ABNORMAL HIGH (ref 12.0–15.0)
Immature Granulocytes: 0 %
Lymphocytes Relative: 25 %
Lymphs Abs: 1.7 10*3/uL (ref 0.7–4.0)
MCH: 29.2 pg (ref 26.0–34.0)
MCHC: 33.1 g/dL (ref 30.0–36.0)
MCV: 88.5 fL (ref 80.0–100.0)
Monocytes Absolute: 0.3 10*3/uL (ref 0.1–1.0)
Monocytes Relative: 5 %
Neutro Abs: 4.7 10*3/uL (ref 1.7–7.7)
Neutrophils Relative %: 68 %
Platelets: 391 10*3/uL (ref 150–400)
RBC: 5.37 MIL/uL — ABNORMAL HIGH (ref 3.87–5.11)
RDW: 12.7 % (ref 11.5–15.5)
WBC: 6.9 10*3/uL (ref 4.0–10.5)
nRBC: 0 % (ref 0.0–0.2)

## 2023-07-30 LAB — COMPREHENSIVE METABOLIC PANEL
ALT: 23 U/L (ref 0–44)
AST: 25 U/L (ref 15–41)
Albumin: 4.5 g/dL (ref 3.5–5.0)
Alkaline Phosphatase: 90 U/L (ref 38–126)
Anion gap: 11 (ref 5–15)
BUN: 10 mg/dL (ref 6–20)
CO2: 25 mmol/L (ref 22–32)
Calcium: 9.5 mg/dL (ref 8.9–10.3)
Chloride: 103 mmol/L (ref 98–111)
Creatinine, Ser: 0.83 mg/dL (ref 0.44–1.00)
GFR, Estimated: >60 mL/min (ref >60)
Glucose, Bld: 94 mg/dL (ref 70–99)
Potassium: 3.5 mmol/L (ref 3.5–5.1)
Sodium: 139 mmol/L (ref 135–145)
Total Bilirubin: 1.2 mg/dL (ref 0.0–1.2)
Total Protein: 8.7 g/dL — ABNORMAL HIGH (ref 6.5–8.1)

## 2023-07-30 LAB — RESP PANEL BY RT-PCR (RSV, FLU A&B, COVID)  RVPGX2
Influenza A by PCR: NEGATIVE
Influenza B by PCR: NEGATIVE
Resp Syncytial Virus by PCR: NEGATIVE
SARS Coronavirus 2 by RT PCR: NEGATIVE

## 2023-07-30 MED ORDER — ACETAMINOPHEN 500 MG PO TABS
500.0000 mg | ORAL_TABLET | Freq: Once | ORAL | Status: AC
  Administered 2023-07-30: 500 mg via ORAL
  Filled 2023-07-30: qty 1

## 2023-07-30 NOTE — Discharge Instructions (Addendum)
 You appear to have an upper respiratory infection (URI). An upper respiratory tract infection, or cold, is a viral infection of the air passages leading to the lungs. It should improve gradually after 5-7 days. You may have a lingering cough that lasts for 2- 4 weeks after the infection.  Your flu, covid, and RSV test were negative today.  Your chest x-ray did not show any signs of pneumonia  Your kidney function, liver function, electrolytes are normal today.  Your hemoglobin is slightly elevated, this can indicate that you are dehydrated.  Your blood pressure was slightly elevated today at 148/93, please follow-up with your PCP within the next month for a blood pressure recheck.  There are no medications, such as antibiotics, that will cure your infection.   Home care instructions:  You may take up to 1000mg  of tylenol every 6 hours as needed for pain. Do not take more then 4g per day.  You were given your first dose here today at 3:30 PM  You may use up to 600mg  ibuprofen every 6 hours as needed for pain.  Do not exceed 2.4g of ibuprofen per day.   For cough: honey 1/2 to 1 teaspoon (you can dilute the honey in water or another fluid).  You can also use guaifenesin and dextromethorphan for cough which are over-the-counter medications. You can use a humidifier for chest congestion and cough.  If you don't have a humidifier, you can sit in the bathroom with the hot shower running.      For sore throat: try warm salt water gargles, cepacol lozenges, throat spray, warm tea or water with lemon/honey, popsicles or ice, or OTC cold relief medicine for throat discomfort.    For congestion: Flonase (fluticasone) 1-2 sprays in each nostril daily.    It is important to stay hydrated: drink plenty of fluids (water, gatorade/powerade/pedialyte, juices, or teas) to keep your throat moisturized and help further relieve irritation/discomfort.   Your illness is contagious and can be spread to others,  especially during the first 3 or 4 days. It cannot be cured by antibiotics or other medicines. Take basic precautions such as washing your hands often, covering your mouth when you cough or sneeze, and avoiding public places where you could spread your illness to others.   Follow-up instructions: Please follow-up with your primary care provider for further evaluation of your symptoms if you are not feeling better within the next 5 days.   Return instructions:  Please return to the Emergency Department if you experience worsening symptoms.  RETURN IMMEDIATELY IF you develop shortness of breath, confusion or altered mental status, a new rash, become dizzy, faint, or poorly responsive, or are unable to be cared for at home. Please return if you have persistent vomiting and cannot keep down fluids or develop a fever that is not controlled by tylenol or motrin.   Please return if you have any other emergent concerns.

## 2023-07-30 NOTE — ED Triage Notes (Signed)
 Pt reports feeling unwell since Friday, burning in chest with breathing, some headache, loss of appetite. Denies sick contacts.

## 2023-07-30 NOTE — ED Notes (Addendum)
 Discharge instructions reviewed with patient.

## 2023-07-30 NOTE — ED Provider Notes (Signed)
 [place] EMERGENCY DEPARTMENT AT [facility] Provider Note   CSN: [id number] Arrival date & time: 07/30/23  1449     History  No chief complaint on file.   Shirley Hansen is a 58 y.o. female with history of hypertension, pancreatitis, presents with concern for a burning sensation in her chest when breathing, headache, nausea, and bodyaches over the last 4 days.  The symptoms all started at same time.  Denies any shortness of breath, arm pain, jaw pain. She is able to keep food down, just does not have much of an appetite.  HPI     Home Medications Prior to Admission medications   Medication Sig Start Date End Date Taking? Authorizing Provider  acetaminophen (TYLENOL) 325 MG tablet Take 650 mg by mouth every 6 (six) hours as needed for mild pain.    [provider]  albuterol (VENTOLIN HFA) 108 (90 Base) MCG/ACT inhaler Inhale 2 puffs into the lungs every 2 (two) hours as needed for wheezing or shortness of breath. 07/28/20   Ghimire, Werner Lean, MD  amLODipine (NORVASC) 10 MG tablet TAKE 1 TABLET BY MOUTH EVERY DAY 01/03/22   Hoy Register, MD  atorvastatin (LIPITOR) 80 MG tablet Take 1 tablet (80 mg total) by mouth at bedtime. 10/20/21 07/17/23  Claiborne Rigg, NP  carvedilol (COREG) 25 MG tablet Take 1 tablet (25 mg total) by mouth 2 (two) times daily. 10/20/21   Claiborne Rigg, NP  celecoxib (CELEBREX) 100 MG capsule Take 100 mg by mouth 2 (two) times daily.    [provider]  Cholecalciferol (VITAMIN D3) 50 MCG (2000 UT) capsule Take 2,000 Units by mouth every morning. 04/17/22   [provider]  diclofenac Sodium (VOLTAREN) 1 % GEL Apply 1 application topically 4 (four) times daily as needed (pain). 05/20/20   [provider]  docusate sodium (COLACE) 100 MG capsule Take 100 mg by mouth 2 (two) times daily.    [provider]  dorzolamide-timolol (COSOPT) 22.3-6.8 MG/ML ophthalmic solution Place 1 drop into both eyes 2 (two)  times daily. 05/31/20   [provider]  DULoxetine (CYMBALTA) 30 MG capsule Take 1 capsule (30 mg total) by mouth daily. 07/17/23   Windell Norfolk, MD  gabapentin (NEURONTIN) 100 MG capsule Take 100 mg by mouth 3 (three) times daily.    [provider]  lidocaine (LIDODERM) 5 % Place 1 patch onto the skin daily. Remove & Discard patch within 12 hours or as directed by MD    [provider]  Netarsudil Dimesylate 0.02 % SOLN Place 1 drop into both eyes at bedtime. 02/07/21   [provider]  pregabalin (LYRICA) 150 MG capsule Take 150 mg by mouth daily.    [provider]  pregabalin (LYRICA) 200 MG capsule Take 200 mg by mouth daily.    [provider]  tiZANidine (ZANAFLEX) 4 MG tablet Take 1 tablet (4 mg total) by mouth every 6 (six) hours as needed for muscle spasms. 10/20/21   Claiborne Rigg, NP      Allergies    Patient has no known allergies.    Review of Systems   Review of Systems  Constitutional:  Positive for appetite change and chills. Negative for fever.    Physical Exam Updated Vital Signs BP (!) 140/95 (BP Location: Right Arm)   Pulse 85   Temp 98.3 F (36.8 C) (Oral)   Resp 18   SpO2 100%  Physical Exam Vitals and nursing note  reviewed.  Constitutional:      General: She is not in acute distress.    Appearance: She is well-developed.  HENT:     Head: Normocephalic and atraumatic.  Eyes:     Conjunctiva/sclera: Conjunctivae normal.  Cardiovascular:     Rate and Rhythm: Normal rate and regular rhythm.     Heart sounds: No murmur heard. Pulmonary:     Effort: Pulmonary effort is normal. No respiratory distress.     Breath sounds: Normal breath sounds.  Abdominal:     Palpations: Abdomen is soft.     Tenderness: There is no abdominal tenderness.  Musculoskeletal:        General: No swelling.     Cervical back: Neck supple.  Skin:    General: Skin is warm and dry.     Capillary Refill: Capillary refill  takes less than 2 seconds.  Neurological:     Mental Status: She is alert.  Psychiatric:        Mood and Affect: Mood normal.     ED Results / Procedures / Treatments   Labs (all labs ordered are listed, but only abnormal results are displayed) Labs Reviewed  COMPREHENSIVE METABOLIC PANEL - Abnormal; Notable for the following components:      Result Value   Total Protein 8.7 (*)    All other components within normal limits  CBC WITH DIFFERENTIAL/PLATELET - Abnormal; Notable for the following components:   RBC 5.37 (*)    Hemoglobin 15.7 (*)    HCT 47.5 (*)    All other components within normal limits  RESP PANEL BY RT-PCR (RSV, FLU A&B, COVID)  [id number]    EKG None  Radiology DG Chest 2 View Result Date: 07/30/2023 CLINICAL DATA:  Cough and chest discomfort. EXAM: CHEST - 2 VIEW COMPARISON:  04/26/2022 FINDINGS: Midline trachea. Normal heart size for level of inspiration. Atherosclerosis in the transverse aorta. No pleural effusion or pneumothorax. Poor inspiratory effort with volume loss at the lung bases, greater left than right, similar. No lobar consolidation. IMPRESSION: No acute finding or interval change. Mild volume loss at the lung bases in the setting of poor inspiratory effort. Aortic Atherosclerosis (ICD10-I70.0). Electronically Signed   By: Jeronimo Greaves M.D.   On: 07/30/2023 17:56    Procedures Procedures    Medications Ordered in ED Medications  acetaminophen (TYLENOL) tablet 500 mg (500 mg Oral Given 07/30/23 1641)    ED Course/ Medical Decision Making/ A&P                                 Medical Decision Making Amount and/or Complexity of Data Reviewed Labs: ordered. Radiology: ordered.  Risk OTC drugs.     Differential diagnosis includes but is not limited to COVID, flu, RSV, viral URI, strep pharyngitis, viral pharyngitis, allergic rhinitis, pneumonia, bronchitis   ED Course:  Patient well-appearing, stable vital signs aside from a slightly  elevated blood pressure 140/93.  Her flu, COVID, RSV is negative.  Chest x-ray without signs of pneumonia.  CMP and CBC overall unremarkable.  She is tolerating p.o. intake.  Suspect viral URI as the cause of her symptoms.  She is not having any chest pain, no shortness of breath, and given she has other URI symptoms like headache, congestion, and loss of appetite, low concern for emergent to her chest burning sensation such as ACS.  Stable and appropriate for discharge home with follow-up with PCP.  I Ordered, and personally interpreted labs.  The pertinent results include:   CBC without leukocytosis.  Hemoglobin slightly elevated at 15.7 CMP without any elevation LFTs or creatinine.  Electrolytes within normal limits COVID, flu, RSV negative Patient given Tylenol for headache   Impression: Viral URI  Disposition:  The patient was discharged home with instructions to use over-the-counter medications as needed for symptom control.  Follow-up with PCP within the next week for a recheck of her symptoms.  Needs to follow-up within the next month with PCP for blood pressure recheck and management. Return precautions given.  Imaging Studies ordered: I ordered imaging studies including chest x-ray I independently visualized the imaging with scope of interpretation limited to determining acute life threatening conditions related to emergency care. Imaging showed no acute abnormalities I agree with the radiologist interpretation               Final Clinical Impression(s) / ED Diagnoses Final diagnoses:  Viral URI    Rx / DC Orders ED Discharge Orders     None         Arabella Merles, PA-C 07/30/23 1910    Loetta Rough, MD 07/30/23 2026

## 2023-07-30 NOTE — ED Triage Notes (Deleted)
 Pt arrived EMS from home. Reports Fatigue,fever and headache since Friday

## 2023-09-12 ENCOUNTER — Encounter: Payer: Self-pay | Admitting: Neurology

## 2023-09-12 ENCOUNTER — Telehealth: Payer: Self-pay | Admitting: Neurology

## 2023-09-12 NOTE — Telephone Encounter (Signed)
 LVM and sent letter in mail informing pt of need to reschedule 01/28/24 appt - MD out

## 2023-10-11 NOTE — Telephone Encounter (Signed)
 Pt has called and r/s her appointment

## 2023-10-16 ENCOUNTER — Encounter: Payer: Self-pay | Admitting: Student

## 2023-10-17 ENCOUNTER — Other Ambulatory Visit: Payer: Self-pay | Admitting: Student

## 2023-10-17 DIAGNOSIS — F17211 Nicotine dependence, cigarettes, in remission: Secondary | ICD-10-CM

## 2023-10-31 ENCOUNTER — Ambulatory Visit
Admission: RE | Admit: 2023-10-31 | Discharge: 2023-10-31 | Disposition: A | Discharge status: Home / Self Care | Source: Ambulatory Visit | Attending: Student | Admitting: Student

## 2023-10-31 DIAGNOSIS — F17211 Nicotine dependence, cigarettes, in remission: Secondary | ICD-10-CM

## 2023-11-22 ENCOUNTER — Emergency Department (HOSPITAL_COMMUNITY)

## 2023-11-22 ENCOUNTER — Inpatient Hospital Stay (HOSPITAL_COMMUNITY)
Admission: RE | Admit: 2023-11-22 | Discharge: 2023-11-24 | DRG: 493 | Disposition: A | Discharge status: Home / Self Care | Attending: Internal Medicine | Admitting: Internal Medicine

## 2023-11-22 ENCOUNTER — Other Ambulatory Visit: Payer: Self-pay

## 2023-11-22 ENCOUNTER — Encounter (HOSPITAL_COMMUNITY): Payer: Self-pay

## 2023-11-22 DIAGNOSIS — E785 Hyperlipidemia, unspecified: Secondary | ICD-10-CM | POA: Diagnosis present

## 2023-11-22 DIAGNOSIS — S42412B Displaced simple supracondylar fracture without intercondylar fracture of left humerus, initial encounter for open fracture: Secondary | ICD-10-CM | POA: Diagnosis not present

## 2023-11-22 DIAGNOSIS — I69354 Hemiplegia and hemiparesis following cerebral infarction affecting left non-dominant side: Secondary | ICD-10-CM

## 2023-11-22 DIAGNOSIS — I2510 Atherosclerotic heart disease of native coronary artery without angina pectoris: Secondary | ICD-10-CM | POA: Diagnosis present

## 2023-11-22 DIAGNOSIS — D62 Acute posthemorrhagic anemia: Secondary | ICD-10-CM | POA: Diagnosis not present

## 2023-11-22 DIAGNOSIS — I252 Old myocardial infarction: Secondary | ICD-10-CM

## 2023-11-22 DIAGNOSIS — M79602 Pain in left arm: Secondary | ICD-10-CM | POA: Diagnosis not present

## 2023-11-22 DIAGNOSIS — G890 Central pain syndrome: Secondary | ICD-10-CM | POA: Diagnosis present

## 2023-11-22 DIAGNOSIS — Z8249 Family history of ischemic heart disease and other diseases of the circulatory system: Secondary | ICD-10-CM

## 2023-11-22 DIAGNOSIS — W06XXXA Fall from bed, initial encounter: Secondary | ICD-10-CM | POA: Diagnosis present

## 2023-11-22 DIAGNOSIS — F32A Depression, unspecified: Secondary | ICD-10-CM | POA: Diagnosis present

## 2023-11-22 DIAGNOSIS — E28319 Asymptomatic premature menopause: Secondary | ICD-10-CM | POA: Diagnosis present

## 2023-11-22 DIAGNOSIS — I129 Hypertensive chronic kidney disease with stage 1 through stage 4 chronic kidney disease, or unspecified chronic kidney disease: Secondary | ICD-10-CM | POA: Diagnosis present

## 2023-11-22 DIAGNOSIS — Z7982 Long term (current) use of aspirin: Secondary | ICD-10-CM

## 2023-11-22 DIAGNOSIS — Z79899 Other long term (current) drug therapy: Secondary | ICD-10-CM

## 2023-11-22 DIAGNOSIS — Z955 Presence of coronary angioplasty implant and graft: Secondary | ICD-10-CM

## 2023-11-22 DIAGNOSIS — D72829 Elevated white blood cell count, unspecified: Secondary | ICD-10-CM | POA: Diagnosis present

## 2023-11-22 DIAGNOSIS — N1831 Chronic kidney disease, stage 3a: Secondary | ICD-10-CM | POA: Diagnosis present

## 2023-11-22 DIAGNOSIS — F419 Anxiety disorder, unspecified: Secondary | ICD-10-CM | POA: Diagnosis present

## 2023-11-22 DIAGNOSIS — Z87891 Personal history of nicotine dependence: Secondary | ICD-10-CM

## 2023-11-22 DIAGNOSIS — M62838 Other muscle spasm: Secondary | ICD-10-CM | POA: Diagnosis present

## 2023-11-22 DIAGNOSIS — H409 Unspecified glaucoma: Secondary | ICD-10-CM | POA: Diagnosis present

## 2023-11-22 DIAGNOSIS — Z8673 Personal history of transient ischemic attack (TIA), and cerebral infarction without residual deficits: Secondary | ICD-10-CM

## 2023-11-22 LAB — CBC WITH DIFFERENTIAL/PLATELET
Abs Immature Granulocytes: 0.1 10*3/uL — ABNORMAL HIGH (ref 0.00–0.07)
Basophils Absolute: 0 10*3/uL (ref 0.0–0.1)
Basophils Relative: 0 %
Eosinophils Absolute: 0 10*3/uL (ref 0.0–0.5)
Eosinophils Relative: 0 %
HCT: 39.5 % (ref 36.0–46.0)
Hemoglobin: 13.1 g/dL (ref 12.0–15.0)
Immature Granulocytes: 1 %
Lymphocytes Relative: 9 %
Lymphs Abs: 1.2 10*3/uL (ref 0.7–4.0)
MCH: 29 pg (ref 26.0–34.0)
MCHC: 33.2 g/dL (ref 30.0–36.0)
MCV: 87.6 fL (ref 80.0–100.0)
Monocytes Absolute: 0.5 10*3/uL (ref 0.1–1.0)
Monocytes Relative: 4 %
Neutro Abs: 12.2 10*3/uL — ABNORMAL HIGH (ref 1.7–7.7)
Neutrophils Relative %: 86 %
Platelets: 343 10*3/uL (ref 150–400)
RBC: 4.51 MIL/uL (ref 3.87–5.11)
RDW: 13.3 % (ref 11.5–15.5)
WBC: 14.1 10*3/uL — ABNORMAL HIGH (ref 4.0–10.5)
nRBC: 0 % (ref 0.0–0.2)

## 2023-11-22 LAB — BASIC METABOLIC PANEL WITH GFR
Anion gap: 10 (ref 5–15)
BUN: 9 mg/dL (ref 6–20)
CO2: 24 mmol/L (ref 22–32)
Calcium: 9.4 mg/dL (ref 8.9–10.3)
Chloride: 108 mmol/L (ref 98–111)
Creatinine, Ser: 0.84 mg/dL (ref 0.44–1.00)
GFR, Estimated: >60 mL/min (ref >60)
Glucose, Bld: 141 mg/dL — ABNORMAL HIGH (ref 70–99)
Potassium: 3.7 mmol/L (ref 3.5–5.1)
Sodium: 142 mmol/L (ref 135–145)

## 2023-11-22 LAB — SURGICAL PCR SCREEN
MRSA, PCR: NEGATIVE
Staphylococcus aureus: NEGATIVE

## 2023-11-22 LAB — HIV ANTIBODY (ROUTINE TESTING W REFLEX): HIV Screen 4th Generation wRfx: NONREACTIVE

## 2023-11-22 MED ORDER — PREGABALIN 100 MG PO CAPS
200.0000 mg | ORAL_CAPSULE | Freq: Every day | ORAL | Status: DC
  Administered 2023-11-22: 200 mg via ORAL
  Filled 2023-11-22 (×2): qty 2

## 2023-11-22 MED ORDER — ENOXAPARIN SODIUM 40 MG/0.4ML IJ SOSY: 40.0000 mg | PREFILLED_SYRINGE | Freq: Once | INTRAMUSCULAR | Status: DC

## 2023-11-22 MED ORDER — HEPARIN SODIUM (PORCINE) 5000 UNIT/ML IJ SOLN
5000.0000 [IU] | Freq: Three times a day (TID) | INTRAMUSCULAR | Status: AC
  Administered 2023-11-22 (×2): 5000 [IU] via SUBCUTANEOUS
  Filled 2023-11-22 (×2): qty 1

## 2023-11-22 MED ORDER — ALBUTEROL SULFATE (2.5 MG/3ML) 0.083% IN NEBU: 2.5000 mg | INHALATION_SOLUTION | RESPIRATORY_TRACT | Status: DC | PRN

## 2023-11-22 MED ORDER — CHLORHEXIDINE GLUCONATE 4 % EX SOLN
60.0000 mL | Freq: Once | CUTANEOUS | Status: AC
Start: 2023-11-22 — End: 2023-11-23
  Filled 2023-11-22: qty 60

## 2023-11-22 MED ORDER — FENTANYL CITRATE (PF) 100 MCG/2ML IJ SOLN
INTRAMUSCULAR | Status: AC | PRN
  Administered 2023-11-22: 50 ug via INTRAVENOUS

## 2023-11-22 MED ORDER — HYDROMORPHONE HCL 1 MG/ML IJ SOLN
1.0000 mg | Freq: Once | INTRAMUSCULAR | Status: AC
  Administered 2023-11-22: 1 mg via INTRAVENOUS
  Filled 2023-11-22: qty 1

## 2023-11-22 MED ORDER — HYDROMORPHONE HCL 2 MG PO TABS: 1.0000 mg | ORAL_TABLET | ORAL | Status: DC | PRN

## 2023-11-22 MED ORDER — HYDROMORPHONE HCL 1 MG/ML IJ SOLN: 1.0000 mg | INTRAMUSCULAR | Status: DC | PRN

## 2023-11-22 MED ORDER — CARVEDILOL 25 MG PO TABS
25.0000 mg | ORAL_TABLET | Freq: Two times a day (BID) | ORAL | Status: DC
  Administered 2023-11-22 (×2): 25 mg via ORAL
  Filled 2023-11-22 (×5): qty 1

## 2023-11-22 MED ORDER — DORZOLAMIDE HCL-TIMOLOL MAL 2-0.5 % OP SOLN
1.0000 [drp] | Freq: Two times a day (BID) | OPHTHALMIC | Status: DC
  Administered 2023-11-22 – 2023-11-24 (×2): 1 [drp] via OPHTHALMIC
  Filled 2023-11-22: qty 10

## 2023-11-22 MED ORDER — FENTANYL CITRATE PF 50 MCG/ML IJ SOSY
100.0000 ug | PREFILLED_SYRINGE | Freq: Once | INTRAMUSCULAR | Status: DC
  Filled 2023-11-22: qty 2

## 2023-11-22 MED ORDER — ACETAMINOPHEN 500 MG PO TABS
1000.0000 mg | ORAL_TABLET | Freq: Three times a day (TID) | ORAL | Status: DC
  Administered 2023-11-22 (×2): 1000 mg via ORAL
  Filled 2023-11-22 (×3): qty 2

## 2023-11-22 MED ORDER — PROPOFOL 10 MG/ML IV BOLUS
INTRAVENOUS | Status: AC | PRN
  Administered 2023-11-22: 50 mg via INTRAVENOUS

## 2023-11-22 MED ORDER — AMLODIPINE BESYLATE 10 MG PO TABS
10.0000 mg | ORAL_TABLET | Freq: Every day | ORAL | Status: DC
  Administered 2023-11-22: 10 mg via ORAL
  Filled 2023-11-22 (×3): qty 1

## 2023-11-22 MED ORDER — HYDROMORPHONE HCL 2 MG PO TABS
1.0000 mg | ORAL_TABLET | ORAL | Status: DC | PRN
  Administered 2023-11-22: 1 mg via ORAL
  Filled 2023-11-22: qty 1

## 2023-11-22 MED ORDER — PREGABALIN 75 MG PO CAPS
150.0000 mg | ORAL_CAPSULE | Freq: Every day | ORAL | Status: DC
  Filled 2023-11-22 (×2): qty 2

## 2023-11-22 MED ORDER — HYDROMORPHONE HCL 1 MG/ML IJ SOLN
1.0000 mg | INTRAMUSCULAR | Status: DC | PRN
  Administered 2023-11-22: 1 mg via INTRAVENOUS
  Filled 2023-11-22 (×3): qty 1

## 2023-11-22 MED ORDER — DULOXETINE HCL 30 MG PO CPEP
30.0000 mg | ORAL_CAPSULE | Freq: Every day | ORAL | Status: DC
  Administered 2023-11-22: 30 mg via ORAL
  Filled 2023-11-22 (×2): qty 1

## 2023-11-22 MED ORDER — PROPOFOL 10 MG/ML IV BOLUS
0.5000 mg/kg | Freq: Once | INTRAVENOUS | Status: DC
  Filled 2023-11-22: qty 20

## 2023-11-22 MED ORDER — ATORVASTATIN CALCIUM 80 MG PO TABS
80.0000 mg | ORAL_TABLET | Freq: Every day | ORAL | Status: DC
  Administered 2023-11-22: 80 mg via ORAL
  Filled 2023-11-22 (×3): qty 1

## 2023-11-22 NOTE — ED Notes (Signed)
 X-ray at bedside

## 2023-11-22 NOTE — Consult Note (Signed)
 Reason for Consult:Left distal humerus fx Referring Physician: Albertus Hughs Time called: 4098 Time at bedside: 0822   Shirley Hansen is an 58 y.o. female.  HPI: [name] rolled out of bed this morning while still asleep. She had immediate left elbow pain when she hit the floor. She was brought to the ED as a level 2 trauma activation where x-rays showed a distal humerus fx and orthopedic surgery was consulted. She lives at home and does not work.  Past Medical History:  Diagnosis Date   Hypertension    Pancreatitis    Stage 3a chronic kidney disease (HCC)    Stroke [facility])     Past Surgical History:  Procedure Laterality Date   cataract surgery Left 04/05/2023   OD   CORONARY ANGIOPLASTY WITH STENT PLACEMENT     TUBAL LIGATION      Family History  Problem Relation Age of Onset   Lung cancer Mother    Prostate cancer Father    Congestive Heart Failure Father    Colon cancer Neg Hx    Colon polyps Neg Hx    Esophageal cancer Neg Hx    Pancreatic cancer Neg Hx    Stomach cancer Neg Hx     Social History:  reports that she has quit smoking. Her smoking use included cigarettes. She has never used smokeless tobacco. She reports that she does not currently use alcohol . She reports that she does not use drugs.  Allergies: No Known Allergies  Medications: I have reviewed the patient's current medications.  No results found for this or any previous visit (from the past 48 hours).  DG Chest Port 1 View Result Date: 11/22/2023 CLINICAL DATA:  Fall. EXAM: PORTABLE CHEST 1 VIEW COMPARISON:  July 30, 2023 FINDINGS: The heart size and mediastinal contours are within normal limits. Elevated left hemidiaphragm is noted. Both lungs are clear. The visualized skeletal structures are unremarkable. IMPRESSION: No active disease. Electronically Signed   By: Rosalene Colon M.D.   On: 11/22/2023 09:43   DG Humerus Left Result Date: 11/22/2023 CLINICAL DATA:  Fall. EXAM: LEFT HUMERUS - 2+ VIEW  COMPARISON:  None Available. FINDINGS: Severely displaced and comminuted fracture is seen involving the supracondylar portion of the distal left humerus. IMPRESSION: Severely displaced and comminuted fracture involving supracondylar portion of distal left humerus. Electronically Signed   By: Rosalene Colon M.D.   On: 11/22/2023 09:42   DG Elbow Complete Left Result Date: 11/22/2023 CLINICAL DATA:  Fall. EXAM: LEFT ELBOW - COMPLETE 3+ VIEW COMPARISON:  None Available. FINDINGS: Severely displaced and comminuted fracture is seen involving the supracondylar portion of distal humerus. Visualized portions of proximal radius and ulna are unremarkable. IMPRESSION: Severely displaced and comminuted fracture involving supracondylar portion of distal left humerus. Electronically Signed   By: Rosalene Colon M.D.   On: 11/22/2023 09:41   DG Shoulder Left Port Result Date: 11/22/2023 CLINICAL DATA:  Left shoulder pain after fall. EXAM: LEFT SHOULDER COMPARISON:  February 17, 2021. FINDINGS: Single view only was obtained. There is no evidence of fracture or dislocation. There is no evidence of arthropathy or other focal bone abnormality. Soft tissues are unremarkable. IMPRESSION: Negative. No definite abnormality seen involving left shoulder on this single projection. Electronically Signed   By: Rosalene Colon M.D.   On: 11/22/2023 09:40    Review of Systems  HENT:  Negative for ear discharge, ear pain, hearing loss and tinnitus.   Eyes:  Negative for photophobia and pain.  Respiratory:  Negative for cough and shortness of breath.   Cardiovascular:  Negative for chest pain.  Gastrointestinal:  Negative for abdominal pain, nausea and vomiting.  Genitourinary:  Negative for dysuria, flank pain, frequency and urgency.  Musculoskeletal:  Positive for arthralgias (Left elbow). Negative for back pain, myalgias and neck pain.  Neurological:  Negative for dizziness and headaches.  Hematological:  Does not  bruise/bleed easily.  Psychiatric/Behavioral:  The patient is not nervous/anxious.    Blood pressure 112/78, pulse (!) 108, temperature 98.5 F (36.9 C), resp. rate 18, height 5' 5 (1.651 m), weight 84.4 kg, SpO2 99%. Physical Exam Constitutional:      General: She is not in acute distress.    Appearance: She is well-developed. She is not diaphoretic.  HENT:     Head: Normocephalic and atraumatic.   Eyes:     General: No scleral icterus.       Right eye: No discharge.        Left eye: No discharge.     Conjunctiva/sclera: Conjunctivae normal.    Cardiovascular:     Rate and Rhythm: Normal rate and regular rhythm.  Pulmonary:     Effort: Pulmonary effort is normal. No respiratory distress.   Musculoskeletal:     Cervical back: Normal range of motion.     Comments: Left shoulder, elbow, wrist, digits- no skin wounds, severe TTP elbow, no instability, no blocks to motion  Sens  Ax intact, R/M/U absent  Mot   Ax/ R/ PIN/ M/ AIN/ U intact  Rad 2+   Skin:    General: Skin is warm and dry.   Neurological:     Mental Status: She is alert.   Psychiatric:        Mood and Affect: Mood normal.        Behavior: Behavior normal.    Assessment/Plan: Left distal humerus fx -- Plan CR then probable ORIF tomorrow with Dr. Curtiss Dowdy. Please keep NPO after MN.    Georganna Kin, PA-C Orthopedic Surgery [phone] 11/22/2023, 9:48 AM

## 2023-11-22 NOTE — Progress Notes (Signed)
 Transition of Care [facility]) - CAGE-AID Screening   Patient Details  Name: Shirley Hansen MRN: [id number] Date of Birth: 1966/01/15  Transition of Care [facility]) CM/SW Contact:    Marvis Sluder, RN Phone Number: 11/22/2023, 7:37 PM   Clinical Narrative: Denies alcohol  and drug use, no resources indicated.   CAGE-AID Screening:    Have You Ever Felt You Ought to Cut Down on Your Drinking or Drug Use?: No Have People Annoyed You By Critizing Your Drinking Or Drug Use?: No Have You Felt Bad Or Guilty About Your Drinking Or Drug Use?: No Have You Ever Had a Drink or Used Drugs First Thing In The Morning to Steady Your Nerves or to Get Rid of a Hangover?: No CAGE-AID Score: 0  Substance Abuse Education Offered: No

## 2023-11-22 NOTE — ED Provider Notes (Signed)
 [facility] AT [facility] Provider Note   CSN: [id number] Arrival date & time: 11/22/23  4098     Patient presents with: Trauma   Shirley Hansen is a 58 y.o. female.   58 yo F with a chief complaints of left arm pain.  The patient had fallen out of bed this morning and woke up to severe left arm pain.  Patient with also possible break in the skin.  Given Ancef with EMS.  Patient was brought to the ED for evaluation.  She denies pain elsewhere denies head injury neck pain chest pain abdominal pain back pain.  She does say that her left shoulder blade hurts a little bit.  Denies lower extremity pain.        Prior to Admission medications   Medication Sig Start Date End Date Taking? Authorizing Provider  albuterol  (VENTOLIN  HFA) 108 (90 Base) MCG/ACT inhaler Inhale 2 puffs into the lungs every 2 (two) hours as needed for wheezing or shortness of breath. 07/28/20  Yes Ghimire, Estil Heman, MD  amLODipine  (NORVASC ) 10 MG tablet TAKE 1 TABLET BY MOUTH EVERY DAY Patient taking differently: Take 10 mg by mouth in the morning. 01/03/22  Yes Newlin, Enobong, MD  aspirin  EC 81 MG tablet Take 81 mg by mouth in the morning. Swallow whole.   Yes [provider]  atorvastatin  (LIPITOR ) 80 MG tablet Take 1 tablet (80 mg total) by mouth at bedtime. 10/20/21 11/22/23 Yes Collins Dean, NP  Baclofen  5 MG TABS Take 5 mg by mouth at bedtime.   Yes [provider]  carvedilol  (COREG ) 25 MG tablet Take 1 tablet (25 mg total) by mouth 2 (two) times daily. 10/20/21  Yes Fleming, Zelda W, NP  Cholecalciferol (VITAMIN D3) 50 MCG (2000 UT) capsule Take 2,000 Units by mouth every morning. 04/17/22  Yes [provider]  cyanocobalamin 2000 MCG tablet Take 2,000 mcg by mouth in the morning.   Yes [provider]  dorzolamide -timolol  (COSOPT ) 22.3-6.8 MG/ML ophthalmic solution Place 1 drop into both eyes 2 (two) times daily. 05/31/20  Yes [provider]  famotidine  (PEPCID ) 20 MG tablet Take 20 mg by mouth at bedtime.   Yes [provider]  ibuprofen (ADVIL) 800 MG tablet Take 800 mg by mouth daily as needed for mild pain (pain score 1-3) or moderate pain (pain score 4-6). Usually in the morning when not taking baclofen    Yes [provider]  lidocaine  (LIDODERM ) 5 % Place 1 patch onto the skin daily. Remove & Discard patch within 12 hours or as directed by MD   Yes [provider]  Menthol , Topical Analgesic, (BIOFREEZE EX) Apply 1 Application topically as needed.   Yes [provider]  Netarsudil  Dimesylate 0.02 % SOLN Place 1 drop into both eyes at bedtime. Rhopressa 02/07/21  Yes [provider]  pregabalin  (LYRICA ) 150 MG capsule Take 150 mg by mouth in the morning.   Yes [provider]  pregabalin  (LYRICA ) 200 MG capsule Take 200 mg by mouth at bedtime.   Yes [provider]    Allergies: Patient has no known allergies.    Review of Systems  Updated Vital Signs BP 108/86   Pulse 91   Temp 98.5 F (36.9 C)   Resp 11   Ht 5' 5 (1.651 m)   Wt 84.4 kg   SpO2 100%   BMI 30.95 kg/m   Physical Exam Vitals and nursing note reviewed.  Constitutional:  General: She is not in acute distress.    Appearance: She is well-developed. She is not diaphoretic.  HENT:     Head: Normocephalic and atraumatic.   Eyes:     Pupils: Pupils are equal, round, and reactive to light.    Cardiovascular:     Rate and Rhythm: Normal rate and regular rhythm.     Heart sounds: No murmur heard.    No friction rub. No gallop.  Pulmonary:     Effort: Pulmonary effort is normal.     Breath sounds: No wheezing or rales.  Abdominal:     General: There is no distension.     Palpations: Abdomen is soft.     Tenderness: There is no abdominal tenderness.   Musculoskeletal:        General: Swelling and tenderness present.     Cervical back: Normal range of motion and neck  supple.     Comments: Pain and swelling to the left arm with deformity mostly at the supracondylar region.  She does have a break to the skin just above the left AC.  Pulse and sensation intact distally the patient is either unwilling or unable to move the hand at all.  Patient was of bruising to the left anterior chest wall.  Palpated from head to toe without any other obvious noted areas of bony tenderness.   Skin:    General: Skin is warm and dry.   Neurological:     Mental Status: She is alert and oriented to person, place, and time.   Psychiatric:        Behavior: Behavior normal.     (all labs ordered are listed, but only abnormal results are displayed) Labs Reviewed  CBC WITH DIFFERENTIAL/PLATELET - Abnormal; Notable for the following components:      Result Value   WBC 14.1 (*)    Neutro Abs 12.2 (*)    Abs Immature Granulocytes 0.10 (*)    All other components within normal limits  BASIC METABOLIC PANEL WITH GFR - Abnormal; Notable for the following components:   Glucose, Bld 141 (*)    All other components within normal limits  HIV ANTIBODY (ROUTINE TESTING W REFLEX)    EKG: None  Radiology: CT HUMERUS LEFT WO CONTRAST Result Date: 11/22/2023 CLINICAL DATA:  Fall. EXAM: CT OF THE UPPER LEFT EXTREMITY WITHOUT CONTRAST TECHNIQUE: Multidetector CT imaging of the upper left extremity was performed according to the standard protocol. RADIATION DOSE REDUCTION: This exam was performed according to the departmental dose-optimization program which includes automated exposure control, adjustment of the mA and/or kV according to patient size and/or use of iterative reconstruction technique. COMPARISON:  Same day radiographs of the left humerus and elbow dated 11/22/2023. FINDINGS: Bones/Joint/Cartilage Acute comminuted supracondylar fracture of the distal humerus extending from the medial distal supracondylar humeral diaphysis obliquely and distally through the lateral supracondylar  distal humeral metaphysis (series 8, images 53 and 54). There is approximately 15 mm of medial displacement along the medial supracondylar fracture margin and 6 mm of posterior displacement at the lateral supracondylar fracture margin (series 3, images 117 and 120). The fracture margin extends through the olecranon fossa. No additional fracture identified. The visualized proximal radius and ulna appear intact. The radiocapitellar and ulnotrochlear joints appear anatomically aligned. Glenohumeral and acromioclavicular joints are anatomically aligned. Ligaments Ligaments are suboptimally evaluated by CT. Muscles and Tendons Muscles are normal. No muscle atrophy. No intramuscular fluid collection or hematoma. Soft tissue Overlying soft tissue splint in place.  Soft tissue surrounding the supracondylar distal humeral fracture described above. No fluid collection or hematoma. No soft tissue mass. IMPRESSION: Acute comminuted and displaced oblique supracondylar fracture of the left distal humerus, as above. Electronically Signed   By: Mannie Seek M.D.   On: 11/22/2023 13:42   DG Chest Port 1 View Result Date: 11/22/2023 CLINICAL DATA:  Fall. EXAM: PORTABLE CHEST 1 VIEW COMPARISON:  July 30, 2023 FINDINGS: The heart size and mediastinal contours are within normal limits. Elevated left hemidiaphragm is noted. Both lungs are clear. The visualized skeletal structures are unremarkable. IMPRESSION: No active disease. Electronically Signed   By: Rosalene Colon M.D.   On: 11/22/2023 09:43   DG Humerus Left Result Date: 11/22/2023 CLINICAL DATA:  Fall. EXAM: LEFT HUMERUS - 2+ VIEW COMPARISON:  None Available. FINDINGS: Severely displaced and comminuted fracture is seen involving the supracondylar portion of the distal left humerus. IMPRESSION: Severely displaced and comminuted fracture involving supracondylar portion of distal left humerus. Electronically Signed   By: Rosalene Colon M.D.   On: 11/22/2023 09:42    DG Elbow Complete Left Result Date: 11/22/2023 CLINICAL DATA:  Fall. EXAM: LEFT ELBOW - COMPLETE 3+ VIEW COMPARISON:  None Available. FINDINGS: Severely displaced and comminuted fracture is seen involving the supracondylar portion of distal humerus. Visualized portions of proximal radius and ulna are unremarkable. IMPRESSION: Severely displaced and comminuted fracture involving supracondylar portion of distal left humerus. Electronically Signed   By: Rosalene Colon M.D.   On: 11/22/2023 09:41   DG Shoulder Left Port Result Date: 11/22/2023 CLINICAL DATA:  Left shoulder pain after fall. EXAM: LEFT SHOULDER COMPARISON:  February 17, 2021. FINDINGS: Single view only was obtained. There is no evidence of fracture or dislocation. There is no evidence of arthropathy or other focal bone abnormality. Soft tissues are unremarkable. IMPRESSION: Negative. No definite abnormality seen involving left shoulder on this single projection. Electronically Signed   By: Rosalene Colon M.D.   On: 11/22/2023 09:40     .Sedation  Date/Time: 11/22/2023 3:26 PM  Performed by: Albertus Hughs, DO Authorized by: Albertus Hughs, DO   Consent:    Consent obtained:  Written   Consent given by:  Patient   Risks discussed:  Allergic reaction and dysrhythmia   Alternatives discussed:  Analgesia without sedation and anxiolysis Universal protocol:    Immediately prior to procedure, a time out was called: yes     Patient identity confirmed:  Arm band and hospital-assigned identification number Indications:    Procedure performed:  Dislocation reduction   Procedure necessitating sedation performed by:  Different physician Pre-sedation assessment:    Time since last food or drink:  6   ASA classification: class 2 - patient with mild systemic disease     Mouth opening:  2 finger widths   Thyromental distance:  3 finger widths   Mallampati score:  II - soft palate, uvula, fauces visible   Neck mobility: reduced     Pre-sedation  assessments completed and reviewed: airway patency, cardiovascular function, hydration status, mental status, nausea/vomiting, pain level, respiratory function and temperature   A pre-sedation assessment was completed prior to the start of the procedure Immediate pre-procedure details:    Reassessment: Patient reassessed immediately prior to procedure     Reviewed: vital signs     Verified: bag valve mask available and emergency equipment available   Procedure details (see MAR for exact dosages):    Preoxygenation:  Nasal cannula   Sedation:  Propofol   Intended level of sedation: deep   Analgesia:  Fentanyl   Intra-procedure monitoring:  Blood pressure monitoring, continuous capnometry, continuous pulse oximetry, cardiac monitor, frequent LOC assessments and frequent vital sign checks   Intra-procedure events: none     Total Provider sedation time (minutes):  35 Post-procedure details:   A post-sedation assessment was completed following the completion of the procedure.   Attendance: Constant attendance by certified staff until patient recovered     Post-sedation assessments completed and reviewed: airway patency, cardiovascular function, hydration status, mental status, nausea/vomiting, pain level, respiratory function and temperature     Patient is stable for discharge or admission: yes     Procedure completion:  Tolerated well, no immediate complications    Medications Ordered in the ED  propofol (DIPRIVAN) 10 mg/mL bolus/IV push 42.2 mg ( Intravenous Canceled Entry 11/22/23 1225)  acetaminophen  (TYLENOL ) tablet 1,000 mg (has no administration in time range)  amLODipine  (NORVASC ) tablet 10 mg (has no administration in time range)  atorvastatin  (LIPITOR ) tablet 80 mg (has no administration in time range)  carvedilol  (COREG ) tablet 25 mg (has no administration in time range)  DULoxetine  (CYMBALTA ) DR capsule 30 mg (has no administration in time range)  pregabalin  (LYRICA ) capsule 150 mg  (has no administration in time range)  pregabalin  (LYRICA ) capsule 200 mg (has no administration in time range)  albuterol  (PROVENTIL ) (2.5 MG/3ML) 0.083% nebulizer solution 2.5 mg (has no administration in time range)  dorzolamide -timolol  (COSOPT ) 2-0.5 % ophthalmic solution 1 drop (has no administration in time range)  heparin  injection 5,000 Units (has no administration in time range)  HYDROmorphone (DILAUDID) injection 1 mg (has no administration in time range)  HYDROmorphone (DILAUDID) tablet 1 mg (has no administration in time range)  HYDROmorphone (DILAUDID) injection 1 mg (1 mg Intravenous Given 11/22/23 0902)  fentaNYL (SUBLIMAZE) injection (50 mcg Intravenous Given 11/22/23 1213)  propofol (DIPRIVAN) 10 mg/mL bolus/IV push (50 mg Intravenous Given 11/22/23 1218)  HYDROmorphone (DILAUDID) injection 1 mg (1 mg Intravenous Given 11/22/23 1329)                                    Medical Decision Making Amount and/or Complexity of Data Reviewed Labs: ordered. Radiology: ordered.  Risk Prescription drug management. Decision regarding hospitalization.   58 yo F with a chief complaints of a fall out of bed.  Patient complaining mostly of left arm pain.  Patient with clinically open supracondylar fracture.  Was given Ancef with EMS.  She feels her tetanus is up-to-date.  Will obtain plain films here.  Will discuss with orthopedics.  Plain film of the left humerus concerning for supracondylar fracture with significant displacement.  I reviewed the patient's medical record and her last tetanus was updated in 2021.  Patient was seen by Steffanie Edouard, PA-C.  Recommendation for reduction at bedside reassessment of wound.  Recommends medical admission.  The patients results and plan were reviewed and discussed.   Any x-rays performed were independently reviewed by myself.   Differential diagnosis were considered with the presenting HPI.  Medications  propofol (DIPRIVAN) 10 mg/mL  bolus/IV push 42.2 mg ( Intravenous Canceled Entry 11/22/23 1225)  acetaminophen  (TYLENOL ) tablet 1,000 mg (has no administration in time range)  amLODipine  (NORVASC ) tablet 10 mg (has no administration in time range)  atorvastatin  (LIPITOR ) tablet 80 mg (has no administration in time range)  carvedilol  (COREG ) tablet 25 mg (has no  administration in time range)  DULoxetine  (CYMBALTA ) DR capsule 30 mg (has no administration in time range)  pregabalin  (LYRICA ) capsule 150 mg (has no administration in time range)  pregabalin  (LYRICA ) capsule 200 mg (has no administration in time range)  albuterol  (PROVENTIL ) (2.5 MG/3ML) 0.083% nebulizer solution 2.5 mg (has no administration in time range)  dorzolamide -timolol  (COSOPT ) 2-0.5 % ophthalmic solution 1 drop (has no administration in time range)  heparin  injection 5,000 Units (has no administration in time range)  HYDROmorphone (DILAUDID) injection 1 mg (has no administration in time range)  HYDROmorphone (DILAUDID) tablet 1 mg (has no administration in time range)  HYDROmorphone (DILAUDID) injection 1 mg (1 mg Intravenous Given 11/22/23 0902)  fentaNYL (SUBLIMAZE) injection (50 mcg Intravenous Given 11/22/23 1213)  propofol (DIPRIVAN) 10 mg/mL bolus/IV push (50 mg Intravenous Given 11/22/23 1218)  HYDROmorphone (DILAUDID) injection 1 mg (1 mg Intravenous Given 11/22/23 1329)    Vitals:   11/22/23 1219 11/22/23 1220 11/22/23 1225 11/22/23 1230  BP: 125/87 (!) 130/92 132/80 108/86  Pulse: (!) 103 (!) 102 95 91  Resp: 14 15 12 11   Temp:      SpO2: 100% 100% 100% 100%  Weight:      Height:        Final diagnoses:  Open supracondylar fracture of left humerus, initial encounter    Admission/ observation were discussed with the admitting physician, patient and/or family and they are comfortable with the plan.       Final diagnoses:  Open supracondylar fracture of left humerus, initial encounter    ED Discharge Orders     None           Albertus Hughs, DO 11/22/23 1527

## 2023-11-22 NOTE — Progress Notes (Signed)
 Orthopedic Tech Progress Note Patient Details:  Shirley Hansen 05-Jul-1965 [id number] Long arm splint applied after reduction Ortho Devices Type of Ortho Device: Long arm splint Ortho Device/Splint Location: LUE Ortho Device/Splint Interventions: Ordered, Application   Post Interventions Patient Tolerated: Well  [name] A [name] 11/22/2023, 12:56 PM

## 2023-11-22 NOTE — Hospital Course (Addendum)
 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 Her hospital course is detailed below:  Supracondylar fracture of left humerus s/p closed reduction and uncomplicated ORIF Patient fell onto left arm from bed and experienced 10/10 pain and swelling of the left elbow. Surgery performed closed reduction and ORIF without complication. Left arm exam was stable throughout her hospitalization with baseline range of motion of fingers, bilateral warmth, and palpable radial pulses. At the time of discharge, patient remained stable and her arm pain was well-controlled. - Percocet 5-325 mg Q4H PRN - Splint for one week - Outpatient orthopedics follow-up for x-rays - Follow-up PT/OT outpatient - Avoid NSAID's with history of prior hemorrhagic CVA  Menopause age 4 c/f Primary Ovarian Insufficiency 40 pack year smoking history Concern for osteoporosis/osteopenia given impressive fracture with relatively minor mechanism of injury, history of early menopause vs. POI, and smoking history. Vitamin D 25 hydroxy 34.11 within normal limits. - Recommend DEXA scan outpatient for concern of osteoporosis/osteopenia  Anemia likely 2/2 Blood Loss Hgb 11.6 down from 12.4 on admission. Likely due to blood loss from surgery. - CBC outpatient to monitor  Leukocytosis WBC 16.2 up from 14.4 yesterday and 14.1 on admission. Likely due to acute pain and stress. Patient was afebrile, HDS, and without signs or symptoms of infectious etiology throughout her hospitalization. 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

## 2023-11-22 NOTE — Plan of Care (Signed)

## 2023-11-22 NOTE — Progress Notes (Signed)
 Orthopedic Tech Progress Note Patient Details:  Shirley Hansen 01/19/1966 [id number] Level 2 Trauma. Applied sling for stabilization before xrays Patient ID: Shirley Hansen, female   DOB: [birth year], 59 y.o.   MRN: [id number]  Shirley Hansen 11/22/2023, 9:21 AM

## 2023-11-22 NOTE — ED Notes (Signed)
 Oortho tech called by Claven Cumming, PA

## 2023-11-22 NOTE — Progress Notes (Signed)
 Responded to page to support pt. That experienced a fall.  Chaplain provided emotional and spiritual support.  Chaplain available as needed.  Anton Baton, [place], [facility], Pager [phone]

## 2023-11-22 NOTE — H&P (Signed)
 Date: 11/22/2023         Patient Name:  Shirley Hansen MRN: [id number]  DOB: 07-07-1965 Age / Sex: 58 y.o., female   PCP: Maryellen Snare, NP         Medical Service: Internal Medicine Teaching Service         Attending Physician: Dr. Cherylene Corrente, MD    First Contact: Lynnea Satchel, MS3    Second Contact: Dr. Sharlon Deacon  Pager: [phone]                 Chief Concern: Elbow pain and swelling  History of Present Illness: Shirley Hansen is a 58 yo female with pertinent PMH of CVA (2021), left hemiparesis, NSTEMI (2017), CAD, HTN, and HLD who presents with supracondylar fracture of left humerus.   Patient was in her usual state of health asleep in bed when she felt herself roll off the bed and fall onto her arm. She reports that it was not painful at first, but she noticed swelling of her left elbow and pain when moving her fingers, prompting her to call EMS and her son. At its worst, the pain was 10 out of 10 and was an 8 out of 10 during our encounter (s/p propofol and reduction but before dilaudid). She denies new tingling or lack of sensation in her left arm. She does not believe she hit her head and denies head pain beyond her baseline neuralgia/neuropathy.   Of note, patient had a nontraumatic subcortical hemorrhage of right cerebral hemisphere in 2021 with residual left hemiparesis and left neuropathic pain.  ED Course: CBC: WBC 14.1 with neutrophil predominance BMP: wnl Imaging: DG elbow left: Severely displaced and comminuted fracture is seen involving the supracondylar portion of distal humerus. Visualized portions of proximal radius and ulna are unremarkable. CXR: Elevated left hemidiaphragm is noted. The visualized skeletal structures are unremarkable. CT humerus left: Acute comminuted and displaced oblique supracondylar fracture of the left distal humerus.  Orthopedics consulted. Patient sedated with propofol for distal humerus reduction without difficulty. Splint applied.  Patient tolerated well.  Patient received dilaudid injection 1 mg.   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 None taken this morning.  Surgical History: Past Surgical History:  Procedure Laterality Date   cataract surgery Left 04/05/2023   OD   CORONARY ANGIOPLASTY WITH STENT PLACEMENT     TUBAL LIGATION      Family History:  Lung cancer Mother  Prostate cancer Father  Congestive Heart Failure Father   Social History:  Lives with niece, whom she raised, and her brother-in-law. Feels safe at home. Independence: ADLs and iADLs. Substances: - Tobacco: former smoker, quit 2020, with approximate 40 pack-year history - Alcohol : none currently, remote history of social drinking - Recreational drugs: cannabis until 2020  Health Maintenance:  - PCP: Maryellen Snare, NP - Health Literacy: Brought medications to ED. Knows her medications by name, when she takes them, and their indications.  Physical  Exam: Blood pressure 133/89, pulse 90, temperature 97.7 F (36.5 C), resp. rate 18, height 5' 5 (1.651 m), weight 84.4 kg, SpO2 98%. General: Well-appearing woman lying comfortably in bed with nasal cannula dislodged from nares. CV: RRR.Aaron Aas No murmurs appreciated. No lower extremity edema appreciated. Pulm: Normal work of breathing on RA. Thorax: No pain on left lateral thorax with deep inspiration. Extremities: Left arm  wrapped and splinted. Capillary refill < 2 seconds on bilateral index fingertips. Fingers and hands warm bilaterally. Fingers of left hand with variable mobility without pain on passive mobility.  Labs and Imaging See ED Course.  Assessment & Plan:  Shirley Hansen is a 58 y.o. female with pertinent PMH of CVA (2021), left hemiparesis, NSTEMI (2017), CAD, HTN, and HLD who presents with supracondylar fracture of left humerus and is admitted to IMTS for observation before surgery tomorrow morning.  Active Hospital Problems Supracondylar fracture of left humerus, open and comminuted Patient had trauma to her left elbow with X-ray and CT evidence of comminuted and displaced oblique supracondylar fracture of the left distal humerus. Ortho was consulted and performed closed reduction in the ED. Plan to perform surgery tomorrow morning. Plan: - NPO at midnight before surgery - Heparin  5000 U at 1400 and 2200 with hold before surgery - Plan to transition to Lovenox  for VTE ppx after surgery - BMP tomorrow AM - Tylenol  1000 mg Q8H - Dilaudid injection 1 mg Q4H PRN for breakthrough/severe pain -Avoid NSAID's with history of prior hemorrhagic CVA   Leukocytosis CBC revealed WBC of 14.1 with neutrophil predominance. Likely due to acute pain and stress. Infection less likely given lack of fever and no other signs or symptoms of infectious etiology. Will continue to monitor. 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 - Dulox 30mg  nightly  - Lyrica  150 mg AM, 200 mg PM - Baclofen  5 mg nightly  Glaucoma bilateral eyes - Dorzol/tim 2-0.5% 1 drop both eyes BID  Level of care: Med-surg  Diet: Regular diet IVF: none VTE: heparin  injection 5,000 Units Start: 11/22/23 1400 SCDs Start:  11/22/23 1334 Code: Full Code Surrogate: Ludivina Safe, daughter, [phone]  Signed: Lynnea Satchel, MS3 11/22/2023, 5:53 PM  Attestation for Student Documentation:  I personally was present and performed or re-performed the history, physical exam and medical decision-making activities of this service and have verified that the service and findings are accurately documented in the student's note.  Patient admitted for Supracondylar fracture of left humerus, open and comminuted who will undergo surgical repair tomorrow morning. Orthopedics requested medicine admission due to prior history of encephalopathy which looks to be caused from medications in 2022. Will provide pain regimen of acetaminophen  1,000 mg q8, Dilaudid 1 mg tablet q4 prn, and dilaudid IV 1 mg q 4 hours for severe or break through pain.   [name], DO 11/22/2023, 5:53 PM

## 2023-11-22 NOTE — ED Notes (Signed)
 Paper consent obtained for conscious sedation by EDP Inga Manges at bedside.

## 2023-11-22 NOTE — H&P (View-Only) (Signed)
 Reason for Consult:Left distal humerus fx Referring Physician: Albertus Hughs Time called: [id number] Time at bedside: 0822   Shirley Hansen is an 58 y.o. female.  HPI: Shirley Hansen rolled out of bed this morning while still asleep. She had immediate left elbow pain when she hit the floor. She was brought to the ED as a level 2 trauma activation where x-rays showed a distal humerus fx and orthopedic surgery was consulted. She lives at home and does not work.  Past Medical History:  Diagnosis Date   Hypertension    Pancreatitis    Stage 3a chronic kidney disease (HCC)    Stroke [facility])     Past Surgical History:  Procedure Laterality Date   cataract surgery Left 04/05/2023   OD   CORONARY ANGIOPLASTY WITH STENT PLACEMENT     TUBAL LIGATION      Family History  Problem Relation Age of Onset   Lung cancer Mother    Prostate cancer Father    Congestive Heart Failure Father    Colon cancer Neg Hx    Colon polyps Neg Hx    Esophageal cancer Neg Hx    Pancreatic cancer Neg Hx    Stomach cancer Neg Hx     Social History:  reports that she has quit smoking. Her smoking use included cigarettes. She has never used smokeless tobacco. She reports that she does not currently use alcohol . She reports that she does not use drugs.  Allergies: No Known Allergies  Medications: I have reviewed the patient's current medications.  No results found for this or any previous visit (from the past 48 hours).  DG Chest Port 1 View Result Date: 11/22/2023 CLINICAL DATA:  Fall. EXAM: PORTABLE CHEST 1 VIEW COMPARISON:  July 30, 2023 FINDINGS: The heart size and mediastinal contours are within normal limits. Elevated left hemidiaphragm is noted. Both lungs are clear. The visualized skeletal structures are unremarkable. IMPRESSION: No active disease. Electronically Signed   By: Rosalene Colon M.D.   On: 11/22/2023 09:43   DG Humerus Left Result Date: 11/22/2023 CLINICAL DATA:  Fall. EXAM: LEFT HUMERUS - 2+ VIEW  COMPARISON:  None Available. FINDINGS: Severely displaced and comminuted fracture is seen involving the supracondylar portion of the distal left humerus. IMPRESSION: Severely displaced and comminuted fracture involving supracondylar portion of distal left humerus. Electronically Signed   By: Rosalene Colon M.D.   On: 11/22/2023 09:42   DG Elbow Complete Left Result Date: 11/22/2023 CLINICAL DATA:  Fall. EXAM: LEFT ELBOW - COMPLETE 3+ VIEW COMPARISON:  None Available. FINDINGS: Severely displaced and comminuted fracture is seen involving the supracondylar portion of distal humerus. Visualized portions of proximal radius and ulna are unremarkable. IMPRESSION: Severely displaced and comminuted fracture involving supracondylar portion of distal left humerus. Electronically Signed   By: Rosalene Colon M.D.   On: 11/22/2023 09:41   DG Shoulder Left Port Result Date: 11/22/2023 CLINICAL DATA:  Left shoulder pain after fall. EXAM: LEFT SHOULDER COMPARISON:  February 17, 2021. FINDINGS: Single view only was obtained. There is no evidence of fracture or dislocation. There is no evidence of arthropathy or other focal bone abnormality. Soft tissues are unremarkable. IMPRESSION: Negative. No definite abnormality seen involving left shoulder on this single projection. Electronically Signed   By: Rosalene Colon M.D.   On: 11/22/2023 09:40    Review of Systems  HENT:  Negative for ear discharge, ear pain, hearing loss and tinnitus.   Eyes:  Negative for photophobia and pain.  Respiratory:  Negative for cough and shortness of breath.   Cardiovascular:  Negative for chest pain.  Gastrointestinal:  Negative for abdominal pain, nausea and vomiting.  Genitourinary:  Negative for dysuria, flank pain, frequency and urgency.  Musculoskeletal:  Positive for arthralgias (Left elbow). Negative for back pain, myalgias and neck pain.  Neurological:  Negative for dizziness and headaches.  Hematological:  Does not  bruise/bleed easily.  Psychiatric/Behavioral:  The patient is not nervous/anxious.    Blood pressure 112/78, pulse (!) 108, temperature 98.5 F (36.9 C), resp. rate 18, height 5' 5 (1.651 m), weight 84.4 kg, SpO2 99%. Physical Exam Constitutional:      General: She is not in acute distress.    Appearance: She is well-developed. She is not diaphoretic.  HENT:     Head: Normocephalic and atraumatic.   Eyes:     General: No scleral icterus.       Right eye: No discharge.        Left eye: No discharge.     Conjunctiva/sclera: Conjunctivae normal.    Cardiovascular:     Rate and Rhythm: Normal rate and regular rhythm.  Pulmonary:     Effort: Pulmonary effort is normal. No respiratory distress.   Musculoskeletal:     Cervical back: Normal range of motion.     Comments: Left shoulder, elbow, wrist, digits- no skin wounds, severe TTP elbow, no instability, no blocks to motion  Sens  Ax intact, R/M/U absent  Mot   Ax/ R/ PIN/ M/ AIN/ U intact  Rad 2+   Skin:    General: Skin is warm and dry.   Neurological:     Mental Status: She is alert.   Psychiatric:        Mood and Affect: Mood normal.        Behavior: Behavior normal.    Assessment/Plan: Left distal humerus fx -- Plan CR then probable ORIF tomorrow with Dr. Curtiss Hansen. Please keep NPO after MN.    Shirley Kin, PA-C Orthopedic Surgery [phone] 11/22/2023, 9:48 AM

## 2023-11-22 NOTE — Anesthesia Preprocedure Evaluation (Addendum)
 Anesthesia Evaluation  Patient identified by MRN, date of birth, ID band Patient awake    Reviewed: Allergy & Precautions, NPO status , Patient's Chart, lab work & pertinent test results  Airway Mallampati: II  TM Distance: >3 FB Neck ROM: Full    Dental no notable dental hx. (+) Teeth Intact, Dental Advisory Given   Pulmonary asthma , former smoker   Pulmonary exam normal breath sounds clear to auscultation       Cardiovascular hypertension, + CAD, + Past MI (non Stemi) and + Cardiac Stents  Normal cardiovascular exam Rhythm:Regular Rate:Normal     Neuro/Psych  PSYCHIATRIC DISORDERS Anxiety Depression    CVA (L hemiparesis)    GI/Hepatic negative GI ROS,,,  Endo/Other    Renal/GU Renal InsufficiencyRenal diseaseLab Results      Component                Value               Date                                    K                        3.7                 11/22/2023                CO2                      24                  11/22/2023                BUN                      9                   11/22/2023                CREATININE               0.84                11/22/2023                  Stage 3 ckd      Musculoskeletal   Abdominal   Peds  Hematology Lab Results      Component                Value               Date                      WBC                      14.1 (H)            11/22/2023                HGB                      13.1                11/22/2023  HCT                      39.5                11/22/2023                MCV                      87.6                11/22/2023                PLT                      343                 11/22/2023              Anesthesia Other Findings   Reproductive/Obstetrics                             Anesthesia Physical Anesthesia Plan  ASA: 3  Anesthesia Plan: General   Post-op Pain Management: Precedex, Tylenol   PO (pre-op)* and Dilaudid  IV   Induction: Intravenous  PONV Risk Score and Plan: 4 or greater and Treatment may vary due to age or medical condition, Ondansetron , Midazolam and Dexamethasone  Airway Management Planned: Oral ETT  Additional Equipment: None  Intra-op Plan:   Post-operative Plan: Extubation in OR  Informed Consent: I have reviewed the patients History and Physical, chart, labs and discussed the procedure including the risks, benefits and alternatives for the proposed anesthesia with the patient or authorized representative who has indicated his/her understanding and acceptance.     Dental advisory given  Plan Discussed with: CRNA and Surgeon  Anesthesia Plan Comments:        Anesthesia Quick Evaluation

## 2023-11-22 NOTE — Procedures (Signed)
 Procedure: Left distal humerus closed reduction   Indication: Left distal humerus fx   Surgeon: Starr Eddy, PA-C   Assist: None   Anesthesia: Propofol via EDP   EBL: None   Complications: None   Findings: After risks/benefits explained patient desires to undergo procedure. Consent obtained and time out performed. Sedation given and efficacy confirmed. Distal humerus reduced without difficulty and splint applied. Pt tolerated the procedure well.       Shirley Kin, PA-C Orthopedic Surgery [phone]

## 2023-11-23 ENCOUNTER — Other Ambulatory Visit: Payer: Self-pay

## 2023-11-23 ENCOUNTER — Encounter (HOSPITAL_COMMUNITY)
Admission: RE | Disposition: A | Discharge status: Home / Self Care | Payer: Self-pay | Source: Home / Self Care | Attending: Internal Medicine

## 2023-11-23 ENCOUNTER — Observation Stay (HOSPITAL_COMMUNITY)

## 2023-11-23 ENCOUNTER — Observation Stay (HOSPITAL_COMMUNITY): Payer: Self-pay | Admitting: Certified Registered Nurse Anesthetist

## 2023-11-23 ENCOUNTER — Encounter (HOSPITAL_COMMUNITY): Payer: Self-pay | Admitting: Internal Medicine

## 2023-11-23 DIAGNOSIS — I69354 Hemiplegia and hemiparesis following cerebral infarction affecting left non-dominant side: Secondary | ICD-10-CM

## 2023-11-23 DIAGNOSIS — N1831 Chronic kidney disease, stage 3a: Secondary | ICD-10-CM | POA: Diagnosis present

## 2023-11-23 DIAGNOSIS — I2510 Atherosclerotic heart disease of native coronary artery without angina pectoris: Secondary | ICD-10-CM

## 2023-11-23 DIAGNOSIS — S42402A Unspecified fracture of lower end of left humerus, initial encounter for closed fracture: Secondary | ICD-10-CM

## 2023-11-23 DIAGNOSIS — M62838 Other muscle spasm: Secondary | ICD-10-CM | POA: Diagnosis present

## 2023-11-23 DIAGNOSIS — Z8249 Family history of ischemic heart disease and other diseases of the circulatory system: Secondary | ICD-10-CM | POA: Diagnosis not present

## 2023-11-23 DIAGNOSIS — W06XXXA Fall from bed, initial encounter: Secondary | ICD-10-CM | POA: Diagnosis present

## 2023-11-23 DIAGNOSIS — H409 Unspecified glaucoma: Secondary | ICD-10-CM

## 2023-11-23 DIAGNOSIS — E28319 Asymptomatic premature menopause: Secondary | ICD-10-CM

## 2023-11-23 DIAGNOSIS — I10 Essential (primary) hypertension: Secondary | ICD-10-CM

## 2023-11-23 DIAGNOSIS — I252 Old myocardial infarction: Secondary | ICD-10-CM

## 2023-11-23 DIAGNOSIS — E289 Ovarian dysfunction, unspecified: Secondary | ICD-10-CM | POA: Diagnosis not present

## 2023-11-23 DIAGNOSIS — D62 Acute posthemorrhagic anemia: Secondary | ICD-10-CM | POA: Diagnosis not present

## 2023-11-23 DIAGNOSIS — Z79899 Other long term (current) drug therapy: Secondary | ICD-10-CM | POA: Diagnosis not present

## 2023-11-23 DIAGNOSIS — E785 Hyperlipidemia, unspecified: Secondary | ICD-10-CM | POA: Diagnosis present

## 2023-11-23 DIAGNOSIS — F32A Depression, unspecified: Secondary | ICD-10-CM | POA: Diagnosis present

## 2023-11-23 DIAGNOSIS — J45909 Unspecified asthma, uncomplicated: Secondary | ICD-10-CM

## 2023-11-23 DIAGNOSIS — Z955 Presence of coronary angioplasty implant and graft: Secondary | ICD-10-CM | POA: Diagnosis not present

## 2023-11-23 DIAGNOSIS — F419 Anxiety disorder, unspecified: Secondary | ICD-10-CM | POA: Diagnosis present

## 2023-11-23 DIAGNOSIS — Z87891 Personal history of nicotine dependence: Secondary | ICD-10-CM

## 2023-11-23 DIAGNOSIS — I129 Hypertensive chronic kidney disease with stage 1 through stage 4 chronic kidney disease, or unspecified chronic kidney disease: Secondary | ICD-10-CM | POA: Diagnosis present

## 2023-11-23 DIAGNOSIS — D72829 Elevated white blood cell count, unspecified: Secondary | ICD-10-CM

## 2023-11-23 DIAGNOSIS — S42422B Displaced comminuted supracondylar fracture without intercondylar fracture of left humerus, initial encounter for open fracture: Secondary | ICD-10-CM

## 2023-11-23 DIAGNOSIS — S42412B Displaced simple supracondylar fracture without intercondylar fracture of left humerus, initial encounter for open fracture: Secondary | ICD-10-CM

## 2023-11-23 DIAGNOSIS — Z7982 Long term (current) use of aspirin: Secondary | ICD-10-CM | POA: Diagnosis not present

## 2023-11-23 DIAGNOSIS — G890 Central pain syndrome: Secondary | ICD-10-CM | POA: Diagnosis present

## 2023-11-23 DIAGNOSIS — M79602 Pain in left arm: Secondary | ICD-10-CM | POA: Diagnosis present

## 2023-11-23 DIAGNOSIS — W06XXXD Fall from bed, subsequent encounter: Secondary | ICD-10-CM | POA: Diagnosis not present

## 2023-11-23 DIAGNOSIS — Z8673 Personal history of transient ischemic attack (TIA), and cerebral infarction without residual deficits: Secondary | ICD-10-CM | POA: Diagnosis not present

## 2023-11-23 HISTORY — PX: ORIF HUMERUS FRACTURE: SHX2126

## 2023-11-23 LAB — CBC
HCT: 37.1 % (ref 36.0–46.0)
Hemoglobin: 12.4 g/dL (ref 12.0–15.0)
MCH: 29.2 pg (ref 26.0–34.0)
MCHC: 33.4 g/dL (ref 30.0–36.0)
MCV: 87.5 fL (ref 80.0–100.0)
Platelets: 354 10*3/uL (ref 150–400)
RBC: 4.24 MIL/uL (ref 3.87–5.11)
RDW: 13.3 % (ref 11.5–15.5)
WBC: 14.4 10*3/uL — ABNORMAL HIGH (ref 4.0–10.5)
nRBC: 0 % (ref 0.0–0.2)

## 2023-11-23 LAB — BASIC METABOLIC PANEL WITH GFR
Anion gap: 11 (ref 5–15)
BUN: 11 mg/dL (ref 6–20)
CO2: 27 mmol/L (ref 22–32)
Calcium: 9.1 mg/dL (ref 8.9–10.3)
Chloride: 102 mmol/L (ref 98–111)
Creatinine, Ser: 0.98 mg/dL (ref 0.44–1.00)
GFR, Estimated: >60 mL/min (ref >60)
Glucose, Bld: 113 mg/dL — ABNORMAL HIGH (ref 70–99)
Potassium: 3.4 mmol/L — ABNORMAL LOW (ref 3.5–5.1)
Sodium: 140 mmol/L (ref 135–145)

## 2023-11-23 LAB — VITAMIN D 25 HYDROXY (VIT D DEFICIENCY, FRACTURES): Vit D, 25-Hydroxy: 34.11 ng/mL (ref 30–100)

## 2023-11-23 SURGERY — OPEN REDUCTION INTERNAL FIXATION (ORIF) DISTAL HUMERUS FRACTURE
Anesthesia: General | Laterality: Left

## 2023-11-23 MED ORDER — METHOCARBAMOL 500 MG PO TABS
500.0000 mg | ORAL_TABLET | Freq: Four times a day (QID) | ORAL | Status: DC | PRN
  Filled 2023-11-23 (×2): qty 1

## 2023-11-23 MED ORDER — PROPOFOL 10 MG/ML IV BOLUS
INTRAVENOUS | Status: DC | PRN
  Administered 2023-11-23: 130 mg via INTRAVENOUS

## 2023-11-23 MED ORDER — DIPHENHYDRAMINE HCL 12.5 MG/5ML PO ELIX: 12.5000 mg | ORAL_SOLUTION | ORAL | Status: DC | PRN

## 2023-11-23 MED ORDER — PHENYLEPHRINE 80 MCG/ML (10ML) SYRINGE FOR IV PUSH (FOR BLOOD PRESSURE SUPPORT)
PREFILLED_SYRINGE | INTRAVENOUS | Status: AC
  Filled 2023-11-23: qty 10

## 2023-11-23 MED ORDER — METOCLOPRAMIDE HCL 5 MG/ML IJ SOLN: 5.0000 mg | Freq: Three times a day (TID) | INTRAMUSCULAR | Status: DC | PRN

## 2023-11-23 MED ORDER — ACETAMINOPHEN 500 MG PO TABS
1000.0000 mg | ORAL_TABLET | Freq: Four times a day (QID) | ORAL | Status: DC
  Filled 2023-11-23 (×3): qty 2

## 2023-11-23 MED ORDER — MIDAZOLAM HCL 2 MG/2ML IJ SOLN
INTRAMUSCULAR | Status: DC | PRN
  Administered 2023-11-23: 1 mg via INTRAVENOUS

## 2023-11-23 MED ORDER — ONDANSETRON HCL 4 MG/2ML IJ SOLN
INTRAMUSCULAR | Status: AC
  Filled 2023-11-23: qty 2

## 2023-11-23 MED ORDER — EPHEDRINE SULFATE-NACL 50-0.9 MG/10ML-% IV SOSY
PREFILLED_SYRINGE | INTRAVENOUS | Status: DC | PRN
  Administered 2023-11-23 (×2): 5 mg via INTRAVENOUS
  Administered 2023-11-23: 10 mg via INTRAVENOUS
  Administered 2023-11-23 (×2): 5 mg via INTRAVENOUS
  Administered 2023-11-23: 10 mg via INTRAVENOUS

## 2023-11-23 MED ORDER — LIDOCAINE 2% (20 MG/ML) 5 ML SYRINGE
INTRAMUSCULAR | Status: DC | PRN
  Administered 2023-11-23: 40 mg via INTRAVENOUS

## 2023-11-23 MED ORDER — CEFAZOLIN SODIUM-DEXTROSE 2-4 GM/100ML-% IV SOLN
2.0000 g | Freq: Three times a day (TID) | INTRAVENOUS | Status: AC
Start: 2023-11-23 — End: 2023-11-24
  Filled 2023-11-23 (×3): qty 100

## 2023-11-23 MED ORDER — PHENYLEPHRINE 80 MCG/ML (10ML) SYRINGE FOR IV PUSH (FOR BLOOD PRESSURE SUPPORT)
PREFILLED_SYRINGE | INTRAVENOUS | Status: DC | PRN
  Administered 2023-11-23: 160 ug via INTRAVENOUS
  Administered 2023-11-23 (×3): 80 ug via INTRAVENOUS
  Administered 2023-11-23: 160 ug via INTRAVENOUS
  Administered 2023-11-23: 80 ug via INTRAVENOUS

## 2023-11-23 MED ORDER — VANCOMYCIN HCL 1000 MG IV SOLR
INTRAVENOUS | Status: AC
  Filled 2023-11-23: qty 20

## 2023-11-23 MED ORDER — PROPOFOL 10 MG/ML IV BOLUS
INTRAVENOUS | Status: AC
Start: 2023-11-23 — End: 2023-11-23
  Filled 2023-11-23: qty 20

## 2023-11-23 MED ORDER — ASPIRIN 81 MG PO TBEC: 81.0000 mg | DELAYED_RELEASE_TABLET | Freq: Every day | ORAL | Status: DC

## 2023-11-23 MED ORDER — ASPIRIN 81 MG PO TBEC
81.0000 mg | DELAYED_RELEASE_TABLET | Freq: Two times a day (BID) | ORAL | Status: DC
  Filled 2023-11-23 (×2): qty 1

## 2023-11-23 MED ORDER — ROCURONIUM BROMIDE 10 MG/ML (PF) SYRINGE
PREFILLED_SYRINGE | INTRAVENOUS | Status: AC
  Filled 2023-11-23: qty 10

## 2023-11-23 MED ORDER — KETAMINE HCL 50 MG/5ML IJ SOSY
PREFILLED_SYRINGE | INTRAMUSCULAR | Status: AC
  Filled 2023-11-23: qty 5

## 2023-11-23 MED ORDER — METHOCARBAMOL 1000 MG/10ML IJ SOLN: 500.0000 mg | Freq: Four times a day (QID) | INTRAMUSCULAR | Status: DC | PRN

## 2023-11-23 MED ORDER — ONDANSETRON HCL 4 MG PO TABS: 4.0000 mg | ORAL_TABLET | Freq: Four times a day (QID) | ORAL | Status: DC | PRN

## 2023-11-23 MED ORDER — CEFAZOLIN SODIUM-DEXTROSE 2-4 GM/100ML-% IV SOLN
INTRAVENOUS | Status: AC
  Filled 2023-11-23: qty 100

## 2023-11-23 MED ORDER — PHENYLEPHRINE HCL-NACL 20-0.9 MG/250ML-% IV SOLN
INTRAVENOUS | Status: DC | PRN
Start: 2023-11-23 — End: 2023-11-23
  Administered 2023-11-23: 50 ug/min via INTRAVENOUS

## 2023-11-23 MED ORDER — OXYCODONE HCL 5 MG PO TABS
5.0000 mg | ORAL_TABLET | ORAL | Status: DC | PRN
  Filled 2023-11-23 (×4): qty 2

## 2023-11-23 MED ORDER — METOCLOPRAMIDE HCL 5 MG PO TABS: 5.0000 mg | ORAL_TABLET | Freq: Three times a day (TID) | ORAL | Status: DC | PRN

## 2023-11-23 MED ORDER — TRANEXAMIC ACID-NACL 1000-0.7 MG/100ML-% IV SOLN
1000.0000 mg | INTRAVENOUS | Status: AC
  Administered 2023-11-23: 1000 mg via INTRAVENOUS

## 2023-11-23 MED ORDER — LACTATED RINGERS IV SOLN: INTRAVENOUS | Status: DC

## 2023-11-23 MED ORDER — MIDAZOLAM HCL 2 MG/2ML IJ SOLN
INTRAMUSCULAR | Status: AC
  Filled 2023-11-23: qty 2

## 2023-11-23 MED ORDER — DEXAMETHASONE SODIUM PHOSPHATE 10 MG/ML IJ SOLN
INTRAMUSCULAR | Status: AC
  Filled 2023-11-23: qty 1

## 2023-11-23 MED ORDER — TRANEXAMIC ACID-NACL 1000-0.7 MG/100ML-% IV SOLN
INTRAVENOUS | Status: AC
  Filled 2023-11-23: qty 100

## 2023-11-23 MED ORDER — LIDOCAINE 2% (20 MG/ML) 5 ML SYRINGE
INTRAMUSCULAR | Status: AC
  Filled 2023-11-23: qty 5

## 2023-11-23 MED ORDER — ACETAMINOPHEN 10 MG/ML IV SOLN
INTRAVENOUS | Status: DC | PRN
  Administered 2023-11-23: 1000 mg via INTRAVENOUS

## 2023-11-23 MED ORDER — EPHEDRINE 5 MG/ML INJ
INTRAVENOUS | Status: AC
  Filled 2023-11-23: qty 5

## 2023-11-23 MED ORDER — CHLORHEXIDINE GLUCONATE 0.12 % MT SOLN: 15.0000 mL | Freq: Once | OROMUCOSAL | Status: AC

## 2023-11-23 MED ORDER — FENTANYL CITRATE (PF) 250 MCG/5ML IJ SOLN
INTRAMUSCULAR | Status: AC
  Filled 2023-11-23: qty 5

## 2023-11-23 MED ORDER — SODIUM CHLORIDE 0.9% FLUSH: 3.0000 mL | Freq: Two times a day (BID) | INTRAVENOUS | Status: DC

## 2023-11-23 MED ORDER — SUGAMMADEX SODIUM 200 MG/2ML IV SOLN
INTRAVENOUS | Status: DC | PRN
  Administered 2023-11-23: 173.2 mg via INTRAVENOUS

## 2023-11-23 MED ORDER — HYDROMORPHONE HCL 1 MG/ML IJ SOLN: 0.5000 mg | INTRAMUSCULAR | Status: DC | PRN

## 2023-11-23 MED ORDER — CEFAZOLIN SODIUM-DEXTROSE 2-4 GM/100ML-% IV SOLN
2.0000 g | INTRAVENOUS | Status: AC
  Administered 2023-11-23: 2 g via INTRAVENOUS

## 2023-11-23 MED ORDER — DEXAMETHASONE SODIUM PHOSPHATE 10 MG/ML IJ SOLN
INTRAMUSCULAR | Status: DC | PRN
  Administered 2023-11-23: 10 mg via INTRAVENOUS

## 2023-11-23 MED ORDER — 0.9 % SODIUM CHLORIDE (POUR BTL) OPTIME: TOPICAL | Status: DC | PRN

## 2023-11-23 MED ORDER — DOCUSATE SODIUM 100 MG PO CAPS
100.0000 mg | ORAL_CAPSULE | Freq: Two times a day (BID) | ORAL | Status: DC
Start: 2023-11-23 — End: 2023-11-24
  Filled 2023-11-23 (×2): qty 1

## 2023-11-23 MED ORDER — OXYCODONE-ACETAMINOPHEN 5-325 MG PO TABS: 1.0000 | ORAL_TABLET | ORAL | 0 refills | Status: DC | PRN

## 2023-11-23 MED ORDER — POLYETHYLENE GLYCOL 3350 17 G PO PACK: 17.0000 g | PACK | Freq: Every day | ORAL | Status: DC | PRN

## 2023-11-23 MED ORDER — ORAL CARE MOUTH RINSE: 15.0000 mL | Freq: Once | OROMUCOSAL | Status: AC

## 2023-11-23 MED ORDER — FENTANYL CITRATE (PF) 250 MCG/5ML IJ SOLN
INTRAMUSCULAR | Status: DC | PRN
  Administered 2023-11-23: 50 ug via INTRAVENOUS

## 2023-11-23 MED ORDER — CHLORHEXIDINE GLUCONATE 0.12 % MT SOLN
OROMUCOSAL | Status: AC
  Filled 2023-11-23: qty 15

## 2023-11-23 MED ORDER — POVIDONE-IODINE 10 % EX SWAB: 2.0000 | Freq: Once | CUTANEOUS | Status: AC

## 2023-11-23 MED ORDER — ROCURONIUM BROMIDE 10 MG/ML (PF) SYRINGE
PREFILLED_SYRINGE | INTRAVENOUS | Status: DC | PRN
  Administered 2023-11-23: 60 mg via INTRAVENOUS
  Administered 2023-11-23: 10 mg via INTRAVENOUS

## 2023-11-23 MED ORDER — ETOMIDATE 2 MG/ML IV SOLN
INTRAVENOUS | Status: AC
  Filled 2023-11-23: qty 10

## 2023-11-23 MED ORDER — ONDANSETRON HCL 4 MG/2ML IJ SOLN
INTRAMUSCULAR | Status: DC | PRN
  Administered 2023-11-23: 4 mg via INTRAVENOUS

## 2023-11-23 MED ORDER — VANCOMYCIN HCL 1000 MG IV SOLR
INTRAVENOUS | Status: DC | PRN
  Administered 2023-11-23: 1000 mg

## 2023-11-23 MED ORDER — ONDANSETRON HCL 4 MG/2ML IJ SOLN: 4.0000 mg | Freq: Four times a day (QID) | INTRAMUSCULAR | Status: DC | PRN

## 2023-11-23 MED ORDER — SODIUM CHLORIDE 0.9% FLUSH: 3.0000 mL | INTRAVENOUS | Status: DC | PRN

## 2023-11-23 SURGICAL SUPPLY — 76 items
BAG COUNTER SPONGE SURGICOUNT (BAG) ×1 IMPLANT
BIT DRILL QC 2.0 SHORT EVOS SM (DRILL) IMPLANT
BIT DRILL QC 2.5MM SHRT EVO SM (DRILL) IMPLANT
BLADE AVERAGE 25X9 (BLADE) IMPLANT
BNDG COHESIVE 4X5 TAN STRL LF (GAUZE/BANDAGES/DRESSINGS) ×1 IMPLANT
BNDG COMPR ESMARK 4X3 LF (GAUZE/BANDAGES/DRESSINGS) IMPLANT
BNDG ELASTIC 3INX 5YD STR LF (GAUZE/BANDAGES/DRESSINGS) IMPLANT
BNDG ELASTIC 4INX 5YD STR LF (GAUZE/BANDAGES/DRESSINGS) IMPLANT
BNDG GAUZE DERMACEA FLUFF 4 (GAUZE/BANDAGES/DRESSINGS) ×2 IMPLANT
BRUSH SCRUB EZ PLAIN DRY (MISCELLANEOUS) ×2 IMPLANT
CLEANER TIP ELECTROSURG 2X2 (MISCELLANEOUS) ×1 IMPLANT
CORD BIPOLAR FORCEPS 12FT (ELECTRODE) ×1 IMPLANT
COVER SURGICAL LIGHT HANDLE (MISCELLANEOUS) ×1 IMPLANT
DRAIN PENROSE 12X.25 LTX STRL (MISCELLANEOUS) IMPLANT
DRAPE C-ARM 42X72 X-RAY (DRAPES) ×1 IMPLANT
DRAPE C-ARMOR (DRAPES) IMPLANT
DRAPE HALF SHEET 40X57 (DRAPES) ×1 IMPLANT
DRAPE INCISE IOBAN 66X45 STRL (DRAPES) IMPLANT
DRAPE SURG ORHT 6 SPLT 77X108 (DRAPES) ×1 IMPLANT
DRAPE U-SHAPE 47X51 STRL (DRAPES) ×1 IMPLANT
DRSG ADAPTIC 3X8 NADH LF (GAUZE/BANDAGES/DRESSINGS) ×1 IMPLANT
DRSG MEPITEL 4X7.2 (GAUZE/BANDAGES/DRESSINGS) IMPLANT
ELECTRODE REM PT RTRN 9FT ADLT (ELECTROSURGICAL) ×1 IMPLANT
EVACUATOR 1/8 PVC DRAIN (DRAIN) IMPLANT
GAUZE PAD ABD 8X10 STRL (GAUZE/BANDAGES/DRESSINGS) ×1 IMPLANT
GAUZE SPONGE 4X4 12PLY STRL (GAUZE/BANDAGES/DRESSINGS) ×2 IMPLANT
GLOVE BIO SURGEON STRL SZ 6.5 (GLOVE) ×3 IMPLANT
GLOVE BIO SURGEON STRL SZ7.5 (GLOVE) ×3 IMPLANT
GLOVE BIOGEL PI IND STRL 6.5 (GLOVE) ×1 IMPLANT
GLOVE BIOGEL PI IND STRL 7.5 (GLOVE) ×1 IMPLANT
GOWN STRL REUS W/ TWL LRG LVL3 (GOWN DISPOSABLE) ×3 IMPLANT
GOWN STRL REUS W/ TWL XL LVL3 (GOWN DISPOSABLE) ×1 IMPLANT
KIT BASIN OR (CUSTOM PROCEDURE TRAY) ×1 IMPLANT
KIT TURNOVER KIT B (KITS) ×1 IMPLANT
KWIRE FX150X1.6XTROC PNT (WIRE) IMPLANT
LOOP VASCLR MAXI BLUE 18IN ST (MISCELLANEOUS) ×1 IMPLANT
LOOPS VASCLR MAXI BLUE 18IN ST (MISCELLANEOUS) IMPLANT
MANIFOLD NEPTUNE II (INSTRUMENTS) ×1 IMPLANT
NDL HYPO 25X1 1.5 SAFETY (NEEDLE) IMPLANT
NEEDLE HYPO 25X1 1.5 SAFETY (NEEDLE) IMPLANT
NS IRRIG 1000ML POUR BTL (IV SOLUTION) ×1 IMPLANT
PACK ORTHO EXTREMITY (CUSTOM PROCEDURE TRAY) ×1 IMPLANT
PAD ABD 8X10 STRL (GAUZE/BANDAGES/DRESSINGS) IMPLANT
PAD ARMBOARD POSITIONER FOAM (MISCELLANEOUS) ×2 IMPLANT
PAD CAST CTTN 4X4 STRL (SOFTGOODS) IMPLANT
PADDING CAST ABS COTTON 3X4 (CAST SUPPLIES) IMPLANT
PLATE HUM EVOS 3H L 2.7X80 (Plate) IMPLANT
PLATE HUM EVOS 6H L 2.7X85 (Plate) IMPLANT
SCREW CORT 2.7X17 T8 ST EVOS (Screw) IMPLANT
SCREW CORT 2.7X38 T8 ST EVOS (Screw) IMPLANT
SCREW CORT 2.7X42 ST VA EVOS (Screw) IMPLANT
SCREW CORT 2.7X42 STAR T8 EVOS (Screw) IMPLANT
SCREW CORT 3.5X20 ST EVOS (Screw) IMPLANT
SCREW CORT VA EVOS 2.7X26 (Screw) IMPLANT
SCREW CORT VA EVOS 2.7X28 (Screw) IMPLANT
SCREW CORTEX 3.5X18 EVOS (Screw) IMPLANT
SCREW CORTEX 3.5X24MM (Screw) IMPLANT
SCREW CORTEX 3.5X26 (Screw) IMPLANT
SCREW EVOS 2.7X18 LOCK T8 (Screw) IMPLANT
SCREW LOCK ST EVOS 2.7X24 (Screw) IMPLANT
SPLINT PLASTER CAST FAST 5X30 (CAST SUPPLIES) IMPLANT
SPONGE T-LAP 18X18 ~~LOC~~+RFID (SPONGE) IMPLANT
STAPLER SKIN PROX 35W (STAPLE) IMPLANT
STOCKINETTE IMPERVIOUS 9X36 MD (GAUZE/BANDAGES/DRESSINGS) ×1 IMPLANT
SUCTION TUBE FRAZIER 10FR DISP (SUCTIONS) ×1 IMPLANT
SUT ETHILON 3 0 PS 1 (SUTURE) ×2 IMPLANT
SUT MON AB 2-0 CT1 36 (SUTURE) IMPLANT
SUT VIC AB 0 CT1 27XBRD ANBCTR (SUTURE) ×2 IMPLANT
SUT VIC AB 2-0 CT1 TAPERPNT 27 (SUTURE) ×2 IMPLANT
SYR 5ML LL (SYRINGE) IMPLANT
SYR CONTROL 10ML LL (SYRINGE) ×1 IMPLANT
TOWEL GREEN STERILE (TOWEL DISPOSABLE) ×1 IMPLANT
TOWEL GREEN STERILE FF (TOWEL DISPOSABLE) ×1 IMPLANT
TRAY FOLEY MTR SLVR 16FR STAT (SET/KITS/TRAYS/PACK) IMPLANT
WATER STERILE IRR 1000ML POUR (IV SOLUTION) ×1 IMPLANT
YANKAUER SUCT BULB TIP NO VENT (SUCTIONS) IMPLANT

## 2023-11-23 NOTE — Progress Notes (Signed)
 PT Cancellation Note  Patient Details Name: Razan Siler MRN: [id number] DOB: 07/04/1965   Cancelled Treatment:    Reason Eval/Treat Not Completed: Medical issues which prohibited therapy; note plan for OR this am.  Will follow up after humeral fixation.   Marley Simmers 11/23/2023, 9:14 AM Abigail Hoff, PT Acute Rehabilitation Services Office:[phone] 11/23/2023

## 2023-11-23 NOTE — Progress Notes (Signed)
 Orthopedic Tech Progress Note Patient Details:  Shirley Hansen 1966/05/01 [id number]  Patient ID: Shirley Hansen, female   DOB: September 28, 1965, 58 y.o.   MRN: [id number] No OHF. UE injury [name] 11/23/2023, 6:50 PM

## 2023-11-23 NOTE — Anesthesia Postprocedure Evaluation (Signed)
 Anesthesia Post Note  Patient: Shirley Hansen  Procedure(s) Performed: OPEN REDUCTION INTERNAL FIXATION (ORIF) DISTAL HUMERUS FRACTURE (Left)     Patient location during evaluation: PACU Anesthesia Type: General Level of consciousness: awake and alert Pain management: pain level controlled Vital Signs Assessment: post-procedure vital signs reviewed and stable Respiratory status: spontaneous breathing, nonlabored ventilation, respiratory function stable and patient connected to nasal cannula oxygen Cardiovascular status: blood pressure returned to baseline and stable Postop Assessment: no apparent nausea or vomiting Anesthetic complications: no  No notable events documented.  Last Vitals:  Vitals:   11/23/23 1400 11/23/23 1415  BP: 104/80 102/79  Pulse: 74 82  Resp: 17 14  Temp:  36.6 C  SpO2: 94% 95%    Last Pain:  Vitals:   11/23/23 1400  TempSrc:   PainSc: 0-No pain                 Rosalita Combe

## 2023-11-23 NOTE — Plan of Care (Signed)
  Problem: Skin Integrity: Goal: Risk for impaired skin integrity will decrease Outcome: Not Progressing   Problem: Pain Managment: Goal: General experience of comfort will improve and/or be controlled Outcome: Not Progressing   Problem: Safety: Goal: Ability to remain free from injury will improve Outcome: Not Progressing

## 2023-11-23 NOTE — Transfer of Care (Signed)
 Immediate Anesthesia Transfer of Care Note  Patient: Shirley Hansen  Procedure(s) Performed: OPEN REDUCTION INTERNAL FIXATION (ORIF) DISTAL HUMERUS FRACTURE (Left)  Patient Location: PACU  Anesthesia Type:General  Level of Consciousness: drowsy, patient cooperative, and responds to stimulation  Airway & Oxygen Therapy: Patient Spontanous Breathing and Patient connected to nasal cannula oxygen  Post-op Assessment: Report given to RN, Post -op Vital signs reviewed and stable, Patient moving all extremities X 4, and Patient able to stick tongue midline  Post vital signs: Reviewed and stable  Last Vitals:  Vitals Value Taken Time  BP 120/81 11/23/23 13:41  Temp 36.6 C 11/23/23 13:40  Pulse 77 11/23/23 13:43  Resp 15 11/23/23 13:43  SpO2 99 % 11/23/23 13:43  Vitals shown include unfiled device data.  Last Pain:  Vitals:   11/23/23 0945  TempSrc: Oral  PainSc:          Complications: No notable events documented.

## 2023-11-23 NOTE — Interval H&P Note (Signed)
 History and Physical Interval Note:  11/23/2023 10:24 AM  Shirley Hansen  has presented today for surgery, with the diagnosis of left distal humerus fracture.  The various methods of treatment have been discussed with the patient and family. After consideration of risks, benefits and other options for treatment, the patient has consented to  Procedure(s): OPEN REDUCTION INTERNAL FIXATION (ORIF) DISTAL HUMERUS FRACTURE (Left) as a surgical intervention.  The patient's history has been reviewed, patient examined, no change in status, stable for surgery.  I have reviewed the patient's chart and labs.  Questions were answered to the patient's satisfaction.     Lezly Rumpf P Tomeko Scoville

## 2023-11-23 NOTE — TOC Initial Note (Signed)
 Transition of Care [facility]) - Initial/Assessment Note    Patient Details  Name: Shirley Hansen MRN: [id number] Date of Birth: 09/27/1965  Transition of Care [facility]) CM/SW Contact:    Jaime Mayans, RN Phone Number: 11/23/2023, 3:26 PM  Clinical Narrative:                 Patient is a 58yr old female, from home with left humerus fracture. Underwent ORIF of L Humerus fracture today. Patient lives with her brother-in-law and niece. TOC will continue to follow for needs.      Patient Goals and CMS Choice            Expected Discharge Plan and Services                                              Prior Living Arrangements/Services                       Activities of Daily Living   ADL Screening (condition at time of admission) Independently performs ADLs?: Yes (appropriate for developmental age) Is the patient deaf or have difficulty hearing?: No Does the patient have difficulty seeing, even when wearing glasses/contacts?: Yes Does the patient have difficulty concentrating, remembering, or making decisions?: No  Permission Sought/Granted                  Emotional Assessment              Admission diagnosis:  Open supracondylar fracture of left humerus, initial encounter [S42.412B] Supracondylar fracture of humerus, left, open, initial encounter [S42.412B] Patient Active Problem List   Diagnosis Date Noted   Supracondylar fracture of humerus, left, open, initial encounter 11/22/2023   Acute encephalopathy 04/09/2021   Hypertensive urgency 04/09/2021   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, [place] - [address] [place] [address] Phone: [phone] Fax: [phone]  [facility] - [facility] [address], [address] [place] [address] Phone: [phone] Fax: [phone]  [facility] - [place], [place] - [address] [place] [address] Phone: [phone] Fax: [phone]     Social Drivers of Health (SDOH) Social History: SDOH Screenings   Food Insecurity: No Food Insecurity (08/26/2022)   Received from [facility]  Transportation Needs: No Transportation Needs (08/26/2022)   Received from [facility]  Utilities: Not At Risk (11/22/2023)  Depression (PHQ2-9): Low Risk  (04/20/2021)  Recent Concern: Depression (PHQ2-9) - Medium Risk (04/05/2021)  Financial Resource Strain: Low Risk  (08/26/2022)   Received from [facility]  Physical Activity: Inactive (10/06/2020)   Received from [facility]  Social Connections: Moderately Isolated (10/06/2020)   Received from [facility]  Stress: No Stress Concern Present (10/06/2020)  Received from [facility]  Tobacco Use: Medium Risk (11/23/2023)   SDOH Interventions:     Readmission Risk Interventions     No data to display

## 2023-11-23 NOTE — Discharge Instructions (Signed)
 Katheryne Pane, MD Alona Jamaica PA-C [facility] [address] [phone] Theone Fitting)   [phone] (fax)                                  POST-OPERATIVE INSTRUCTIONS   WEIGHT BEARING STATUS: Nonweightbearing left upper extremity today  RANGE OF MOTION/ACTIVITY: Keep splint in place.  Use sling as needed for comfort  WOUND CARE Please keep splint clean dry and intact until follow-up. If your splint gets wet for any reason please contact the office immediately.  Do not stick anything down your splint such as pencils, momey, hangers to try and scratch yourself.  If you feel itchy take Benadryl  as prescribed on the bottle for itching You may shower on Post-Op Day #2.  You must keep splint dry during this process and may find that a plastic bag taped around the extremity or alternatively a towel based bath may be a better option.   If you get your splint wet or if it is damaged please contact our clinic.  EXERCISES Due to your splint being in place you will not be able to bear weight through your extremity.   Please use your sling until follow-up. Please continue to work on range of motion of your fingers and stretch these multiple times a day to prevent stiffness. Please continue to ambulate and do not stay sitting or lying for too long. Perform foot and wrist pumps to assist in circulation.  DVT/PE prophylaxis: Aspirin  81 mg  DIET: As you were eating previously.  Can use over the counter stool softeners and bowel preparations, such as Miralax , to help with bowel movements.  Narcotics can be constipating.  Be sure to drink plenty of fluids  REGIONAL ANESTHESIA (NERVE BLOCKS) The anesthesia team may have performed a nerve block for you if safe in the setting of your care.  This is a great tool used to minimize pain.  Typically the block may start wearing off overnight but the long acting medicine may last for 3-4 days.  The nerve block wearing off can be a  challenging period but please utilize your as needed pain medications to try and manage this period.    ICE AND ELEVATE INJURED/OPERATIVE EXTREMITY Using ice and elevating the injured extremity above your heart can help with swelling and pain control.  Icing in a pulsatile fashion, such as 20 minutes on and 20 minutes off, can be followed.   Do not place ice directly on skin. Make sure there is a barrier between to skin and the ice pack.   Using frozen items such as frozen peas works well as the conform nicely to the are that needs to be iced.  POST-OP MEDICATIONS- Multimodal approach to pain control In general your pain will be controlled with a combination of substances.  Prescriptions unless otherwise discussed are electronically sent to your pharmacy.  This is a carefully made plan we use to minimize narcotic use.                     -   Acetaminophen  - Non-narcotic pain medicine taken on a scheduled basis        -   Oxycodone  - This is a strong narcotic, to be used only on an as needed basis for pain.                  -  Robaxin  -  Muscle relaxer taken on an as needed basis for muscle spasms                  -   Gabapentin  - Helps with nerve pain, such as burning or tingling feeling in the operative arm             STOP SMOKING OR USING NICOTINE PRODUCTS!!!!  As discussed nicotine severely impairs your body's ability to heal surgical and traumatic wounds but also impairs bone healing.  Wounds and bone heal by forming microscopic blood vessels (angiogenesis) and nicotine is a vasoconstrictor (essentially, shrinks blood vessels).  Therefore, if vasoconstriction occurs to these microscopic blood vessels they essentially disappear and are unable to deliver necessary nutrients to the healing tissue.  This is one modifiable factor that you can do to dramatically increase your chances of healing your injury.    (This means no smoking, no nicotine gum, patches, etc)   FOLLOW-UP If you develop  a Fever (>101.5), Redness or Drainage from the surgical incision site, please call our office to arrange for an evaluation. Please call the office to schedule a follow-up appointment for your incision check if you do not already have one, 7-10 days post-operatively.  CALL THE OFFICE WITH ANY QUESTIONS OR CONCERNS: [phone]   VISIT OUR WEBSITE FOR ADDITIONAL INFORMATION: [email or web address]    OTHER HELPFUL INFORMATION  If you had a block, it will wear off between 8-24 hrs postop typically.  This is period when your pain may go from nearly zero to the pain you would have had postop without the block.  This is an abrupt transition but nothing dangerous is happening.  You may take an extra dose of narcotic when this happens.  You may be more comfortable sleeping in a semi-seated position the first few nights following surgery.  Keep a pillow propped under the elbow and forearm for comfort.  If you have a recliner type of chair it might be beneficial.  If not that is fine too, but it would be helpful to sleep propped up with pillows behind your operated shoulder as well under your elbow and forearm.  This will reduce pulling on the suture lines.  When dressing, put your operative arm in the sleeve first.  When getting undressed, take your operative arm out last.  Loose fitting, button-down shirts are recommended.  Often in the first days after surgery you may be more comfortable keeping your operative arm under your shirt and not through the sleeve.  You may return to work/school in the next couple of days when you feel up to it.  Desk work and typing in the sling is fine.  We suggest you use the pain medication the first night prior to going to bed, in order to ease any pain when the anesthesia wears off. You should avoid taking pain medications on an empty stomach as it will make you nauseous. You should wean off your narcotic medicines as soon as you are able.  Most patients will be off or  using minimal narcotics before their first postop appointment.   Do not drink alcoholic beverages or take illicit drugs when taking pain medications.  It is against the law to drive while taking narcotics.  In some states it is against the law to drive while your arm is in a sling.   Pain medication may make you constipated.  Below are a few solutions to try in this order: Decrease the amount of pain  medication if you aren't having pain. Drink lots of decaffeinated fluids. Drink prune juice and/or each dried prunes  If the first 3 don't work start with additional solutions -   Take Colace - an over-the-counter stool softener -   Take Senokot - an over-the-counter laxative -   Take Miralax  - a stronger over-the-counter laxative

## 2023-11-23 NOTE — Anesthesia Procedure Notes (Signed)
 Procedure Name: Intubation Date/Time: 11/23/2023 11:34 AM  Performed by: Alys Julian, CRNAPre-anesthesia Checklist: Patient identified, Emergency Drugs available, Suction available and Patient being monitored Patient Re-evaluated:Patient Re-evaluated prior to induction Oxygen Delivery Method: Circle system utilized Preoxygenation: Pre-oxygenation with 100% oxygen Induction Type: IV induction Ventilation: Mask ventilation without difficulty Laryngoscope Size: Miller and 2 Grade View: Grade II Tube type: Oral Tube size: 7.0 mm Number of attempts: 1 Airway Equipment and Method: Stylet and Bite block Placement Confirmation: ETT inserted through vocal cords under direct vision, positive ETCO2 and breath sounds checked- equal and bilateral Secured at: 21 cm Tube secured with: Tape Dental Injury: Teeth and Oropharynx as per pre-operative assessment

## 2023-11-23 NOTE — Progress Notes (Signed)
 Summary:  Shirley Hansen is a 58 yo female with pertinent PMH of CVA (2021) with resulting left hemiparesis, early menopause (age 7), 8 pack-year smoking history, NSTEMI (2017), CAD, HTN, HLD who presents with supracondylar fracture of left humerus. Orthopedics requested medicine admission due to prior encephalopathy, possibly secondary to Flexeril  in 2022.   Subjective:  No acute events overnight.  Shirley Hansen endorsed some muscle cramping and spasm in her left arm, stable from baseline. She shared that the pain regimen was treating her new pain appropriately. She expressed eagerness for her upcoming orthopedic surgery so she can get back to [her] routine.  Given the impressive fracture with relatively minor mechanism of injury (fall off bed), we brought up concern of osteoporosis/osteopenia. She shared that she had a DEXA scan scheduled for outpatient. She reports undergoing menopause at age 70.  Objective: Vital signs in last 24 hours: Vitals:   11/22/23 1553 11/22/23 1945 11/23/23 0552 11/23/23 0945  BP: 133/89 115/79 109/79 122/79  Pulse: 90 82 84 76  Resp: 18 18 18 18   Temp: 97.7 F (36.5 C) 97.8 F (36.6 C) 97.7 F (36.5 C) 97.6 F (36.4 C)  TempSrc:    Oral  SpO2: 98% 99% 94% 94%  Weight:    86.6 kg  Height:    5' 5 (1.651 m)   Physical Exam:  General: Well-appearing woman lying comfortably in bed. CV: RRR. Pulm: Normal work of breathing on RA. Extremities: Left arm wrapped and splinted with exam stable from yesterday. Capillary refill < 2 seconds on bilateral index fingertips. Fingers and hands warm bilaterally. Fingers of left hand with variable mobility without pain on passive mobility.  Assessment/Plan: Shirley Hansen is a 58 yo female with pertinent PMH of CVA (2021) with resulting left hemiparesis, early menopause (age 9), 37 pack-year smoking history, NSTEMI (2017), CAD, HTN, HLD who presents with supracondylar fracture of left humerus s/p closed reduction.  Supracondylar  fracture of left humerus, open and comminuted, s/p closed reduction Menopause age 89 c/f Primary Ovarian Insufficiency 40 pack year smoking history Patient to undergo ORIF with ortho this morning. She has been HDS and at baseline throughout her hospitalization. Concern for osteoporosis/osteopenia given impressive fracture with relatively minor mechanism of injury, history of early menopause vs. POI, and smoking history. Plan: - Resume diet after surgery - Discontinue heparin  - Will follow up orthopedics recommendations for chemical VTE prophylaxis  - PT and OT consultations - Tylenol  1000 mg Q8H - Dilaudid  injection 1 mg Q4H PRN for breakthrough/severe pain - Avoid NSAID's with history of prior hemorrhagic CVA - Vitamin D level ordered - Recommend DEXA scan outpatient for concern of osteoporosis/osteopenia  Leukocytosis WBC 14.4 up from 14.1 on admission. Likely due to acute pain and stress. Still without signs or symptoms of infectious etiology. 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    Attestation for Student Documentation: I personally was present and performed or re-performed the history, physical exam and medical decision-making  activities of this service and have verified that the service and findings are accurately documented in the student's note.  Shirley Lees, DO 11/23/2023, 1:02 PM

## 2023-11-23 NOTE — Op Note (Signed)
 Orthopaedic Surgery Operative Note (CSN: [id number] ) Date of Surgery:11/23/2023  Admit Date: 11/22/2023   Diagnoses: Pre-Op Diagnoses: Left supracondylar/intracondylar distal humerus fracture  Post-Op Diagnosis: Same  Procedures: CPT 24546-Open reduction internal fixation of left supracondylar/intracondylar distal humerus fracture  Surgeons : Primary: Laneta Pintos, MD  Assistant: Alona Jamaica, PA-C  Location: OR 3   Anesthesia: General   Antibiotics: Ancef 2g preop with 1 gm vancomycin powder placed topically   Tourniquet time: None    Estimated Blood Loss: 30 mL  Complications:* No complications entered in OR log *   Specimens:* No specimens in log *   Implants: Implant Name Type Inv. Item Serial No. Manufacturer Lot No. LRB No. Used Action  SCREW CORT 2.7X42 ST VA EVOS - [id number] Screw SCREW CORT 2.7X42 ST VA EVOS  [employer]  Left 1 Implanted  SCREW CORT VA EVOS 2.7X28 - [id number] Screw SCREW CORT VA EVOS 2.7X28  [employer]  Left 1 Implanted  PLATE HUM EVOS 6H L 2.7X85 - [id number] Plate PLATE HUM EVOS 6H L 2.7X85  [employer]  Left 1 Implanted  SCREW CORT 3.5X20 ST EVOS - [id number] Screw SCREW CORT 3.5X20 ST EVOS  [employer]  Left 2 Implanted  PLATE HUM EVOS 3H L 2.7X80 - [id number] Plate PLATE HUM EVOS 3H L 2.7X80  [employer]  Left 1 Implanted  SCREW CORTEX 3.5X26 - [id number] Screw SCREW CORTEX 3.5X26  [employer]  Left 1 Implanted  SCREW CORTEX 3.5X24MM - [id number] Screw SCREW CORTEX 3.5X24MM  [employer]  Left 3 Implanted  SCREW CORT 2.7X38 T8 ST EVOS - [id number] Screw SCREW CORT 2.7X38 T8 ST EVOS  [employer]  Left 1 Implanted  SCREW CORT 2.7X17 T8 ST EVOS - [id number] Screw SCREW CORT 2.7X17 T8 ST EVOS  [employer]  Left 3 Implanted  SCREW CORT 2.7X42 STAR T8 EVOS - [id number] Screw SCREW CORT 2.7X42  STAR T8 EVOS  [employer]  Left 1 Implanted  SCREW LOCK ST EVOS 2.7X24 - [id number] Screw SCREW LOCK ST EVOS 2.7X24  [employer]  Left 1 Implanted  SCREW CORT VA EVOS 2.7X26 - [id number] Screw SCREW CORT VA EVOS 2.7X26  [employer]  Left 1 Implanted     Indications for Surgery: 58 year old female who sustained a left intra-articular distal humerus fracture.  I recommend proceeding with open reduction internal fixation.  Risks and benefits were discussed with the patient.  Risks include but not limited to bleeding, infection, malunion,  nonunion, elbow stiffness, hardware failure, hardware irritation, nerve and blood vessel injury, DVT, even the possibility anesthetic complication.  She agreed to proceed with surgery and consent was obtained.  Operative Findings: Open reduction internal fixation of left supracondylar/intercondylar distal humerus fracture using [employer] EVOS 3.5/2.7 mm posterior lateral and direct medial distal humeral locking plate.  Procedure: The patient was identified in the preoperative holding area. Consent was confirmed with the patient and their family and all questions were answered. The operative extremity was marked after confirmation with the patient. she was then brought back to the operating room by our anesthesia colleagues.  She was placed under general anesthetic and carefully transferred over to radiolucent flattop table.  She was placed in lateral decubitus position with the left side up.  An axillary roll was placed under her down arm to prevent pressure to her neurovascular structures.  Fluoroscopic imaging was obtained which showed the displaced fracture.  The left upper extremity was then prepped and draped in usual sterile fashion.  A timeout was performed to verify the patient, the procedure, and the extremity.  Preoperative antibiotics were dosed.  A standard posterior approach to the elbow was made and  carried down through skin and subcutaneous tissue.  I incised through the triceps fascia and mobilized both medially and laterally to access the the fracture.  I was able to visualize the intra-articular nature after I incised through the anconeus and lifted the triceps off the lateral side.  I then medially dissected out the ulnar nerve all the way to the cubital tunnel and carefully protected this throughout the case.  I then released the triceps off of the medial side of the humerus.  I then proceeded to reduce the lateral metaphysis to the intact humeral shaft I held it provisionally with a K wire.  I then proceeded to reduce the intra-articular split anatomically with reduction tenaculums.  And then I would was able to reduce the medial side.  Proceeded to place a K wire across the joint and then placed a 2.7 millimeter screw under fluoroscopy to hold the condyles together.  I then drilled and placed a 2.7 millimeter screw to hold the butterfly fragment of the metaphysis in place.  I then turned my attention to the lateral side position and a posterior lateral plate and drilled and placed nonlocking screw into the humeral shaft.  I corrected the sagittal alignment and then proceeded to place another nonlocking screw in the shaft.  I then chose a direct medial plate held provisionally with a K wire and confirmed positioning with fluoroscopy.  I then drilled and placed a nonlocking screw in the humeral shaft and then a nonlocking screw distally in the condyles.  Once I was pleased with the position I then filled the remainder of the screws with locking and nonlocking screws.  I placed 2 screws in the Capitellum on the lateral side.  This completed the construct and final fluoroscopic imaging was obtained.  The incision was irrigated and closed after a gram of vancomycin powder was placed with 0 Vicryl, 2-0 Monocryl and 3-0 nylon.  Sterile dressing was applied and a long-arm splint was then placed.  Post Op  Plan/Instructions: Patient be nonweightbearing to the left upper extremity.  Should be in the splint for 1 week and then have her return to the office for x-rays out of the splint to start range of motion.  Will have her mobilize with physical and Occupational Therapy.  She will receive postoperative Ancef and aspirin  for DVT prophylaxis.  I was present and performed the entire surgery.  Alona Jamaica, PA-C did assist me throughout the case. An assistant was necessary given the difficulty in approach, maintenance of reduction and ability to instrument the fracture.   Katheryne Pane, MD [facility]

## 2023-11-24 DIAGNOSIS — W06XXXD Fall from bed, subsequent encounter: Secondary | ICD-10-CM

## 2023-11-24 DIAGNOSIS — Z8673 Personal history of transient ischemic attack (TIA), and cerebral infarction without residual deficits: Secondary | ICD-10-CM | POA: Diagnosis not present

## 2023-11-24 DIAGNOSIS — S42422B Displaced comminuted supracondylar fracture without intercondylar fracture of left humerus, initial encounter for open fracture: Secondary | ICD-10-CM | POA: Diagnosis not present

## 2023-11-24 DIAGNOSIS — E28319 Asymptomatic premature menopause: Secondary | ICD-10-CM | POA: Diagnosis not present

## 2023-11-24 LAB — BASIC METABOLIC PANEL WITH GFR
Anion gap: 8 (ref 5–15)
BUN: 11 mg/dL (ref 6–20)
CO2: 29 mmol/L (ref 22–32)
Calcium: 9.4 mg/dL (ref 8.9–10.3)
Chloride: 104 mmol/L (ref 98–111)
Creatinine, Ser: 0.93 mg/dL (ref 0.44–1.00)
GFR, Estimated: >60 mL/min (ref >60)
Glucose, Bld: 151 mg/dL — ABNORMAL HIGH (ref 70–99)
Potassium: 3.7 mmol/L (ref 3.5–5.1)
Sodium: 141 mmol/L (ref 135–145)

## 2023-11-24 LAB — CBC
HCT: 35.3 % — ABNORMAL LOW (ref 36.0–46.0)
Hemoglobin: 11.6 g/dL — ABNORMAL LOW (ref 12.0–15.0)
MCH: 28.9 pg (ref 26.0–34.0)
MCHC: 32.9 g/dL (ref 30.0–36.0)
MCV: 88 fL (ref 80.0–100.0)
Platelets: 341 10*3/uL (ref 150–400)
RBC: 4.01 MIL/uL (ref 3.87–5.11)
RDW: 13.1 % (ref 11.5–15.5)
WBC: 16.2 10*3/uL — ABNORMAL HIGH (ref 4.0–10.5)
nRBC: 0 % (ref 0.0–0.2)

## 2023-11-24 MED ORDER — DULOXETINE HCL 30 MG PO CPEP: 30.0000 mg | ORAL_CAPSULE | Freq: Every day | ORAL | 0 refills | Status: DC

## 2023-11-24 NOTE — Progress Notes (Signed)
 Reviewed AVS, patient expressed understanding of medications, MD follow up reviewed.   Removed IV, Site clean, dry and intact.  Patient states all belongings brought to the hospital at time of admission are accounted for and packed to take home.  Patient informed and expressed understanding of where to pick up discharge medications.

## 2023-11-24 NOTE — Plan of Care (Signed)
  Problem: Nutrition: Goal: Adequate nutrition will be maintained Outcome: Not Progressing   Problem: Pain Managment: Goal: General experience of comfort will improve and/or be controlled Outcome: Not Progressing   Problem: Safety: Goal: Ability to remain free from injury will improve Outcome: Not Progressing

## 2023-11-24 NOTE — Discharge Summary (Signed)
 Name: Shirley Hansen MRN: [id number] DOB: Mar 29, 1966 58 y.o. PCP: Maryellen Snare, NP  Date of Admission: 11/22/2023  8:53 AM Date of Discharge: 11/24/2023 Attending Physician: Dr. Bettejane Brownie  Discharge Diagnosis: Principal Problem:   Supracondylar fracture of humerus, left, open, initial encounter Active Problems:   History of CVA (cerebrovascular accident)   Early menopause occurring in patient age younger than 45 years   Open supracondylar fracture of left humerus    Discharge Medications: Allergies as of 11/24/2023   No Known Allergies      Medication List     TAKE these medications    albuterol  108 (90 Base) MCG/ACT inhaler Commonly known as: VENTOLIN  HFA Inhale 2 puffs into the lungs every 2 (two) hours as needed for wheezing or shortness of breath.   amLODipine  10 MG tablet Commonly known as: NORVASC  TAKE 1 TABLET BY MOUTH EVERY DAY What changed: when to take this   aspirin  EC 81 MG tablet Take 81 mg by mouth in the morning. Swallow whole.   atorvastatin  80 MG tablet Commonly known as: LIPITOR  Take 1 tablet (80 mg total) by mouth at bedtime.   Baclofen  5 MG Tabs Take 5 mg by mouth at bedtime.   BIOFREEZE EX Apply 1 Application topically as needed.   carvedilol  25 MG tablet Commonly known as: COREG  Take 1 tablet (25 mg total) by mouth 2 (two) times daily.   cyanocobalamin 2000 MCG tablet Take 2,000 mcg by mouth in the morning.   dorzolamide -timolol  2-0.5 % ophthalmic solution Commonly known as: COSOPT  Place 1 drop into both eyes 2 (two) times daily.   DULoxetine  30 MG capsule Commonly known as: Cymbalta  Take 1 capsule (30 mg total) by mouth at bedtime.   famotidine  20 MG tablet Commonly known as: PEPCID  Take 20 mg by mouth at bedtime.   ibuprofen 800 MG tablet Commonly known as: ADVIL Take 800 mg by mouth daily as needed for mild pain (pain score 1-3) or moderate pain (pain score 4-6). Usually in the morning when not taking baclofen    lidocaine   5 % Commonly known as: LIDODERM  Place 1 patch onto the skin daily. Remove & Discard patch within 12 hours or as directed by MD   Netarsudil  Dimesylate 0.02 % Soln Place 1 drop into both eyes at bedtime. Rhopressa   oxyCODONE -acetaminophen  5-325 MG tablet Commonly known as: Percocet Take 1 tablet by mouth every 4 (four) hours as needed for severe pain (pain score 7-10).   pregabalin  200 MG capsule Commonly known as: LYRICA  Take 200 mg by mouth at bedtime.   pregabalin  150 MG capsule Commonly known as: LYRICA  Take 150 mg by mouth in the morning.   Vitamin D3 50 MCG (2000 UT) capsule Take 2,000 Units by mouth every morning.               Discharge Care Instructions  (From admission, onward)           Start     Ordered   11/24/23 0000  Discharge wound care:       Comments: keep intact until follow up, change as needed if soiled or saturated.   11/24/23 1459            Disposition and follow-up:   Shirley Hansen was discharged from [facility] in Good condition.  At the hospital follow up visit please address:  1.  Follow-up:  a.  Due to unexpected fracture and early menopause she will need a DEXA scan in the  near future.    b.  Ensure good orthopedic follow-up for dressing changes and follow-up x-rays.   Follow-up Appointments:  Follow-up Information     Haddix, Florentina Huntsman, MD. Schedule an appointment as soon as possible for a visit in 2 week(s).   Specialty: Orthopedic Surgery Why: for wound check and repeat x-rays Contact information: 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 Her hospital course is detailed below:  Supracondylar fracture of left humerus s/p closed reduction and uncomplicated ORIF Patient fell onto left arm from bed and experienced 10/10 pain and swelling of the left elbow. Surgery performed closed reduction and ORIF without complication. Left arm exam was stable throughout her hospitalization with baseline range of motion of fingers, bilateral warmth, and palpable radial pulses. At the time of discharge, patient remained stable and her arm pain was well-controlled. - Percocet 5-325 mg Q4H PRN - Splint for one week - Outpatient orthopedics follow-up for x-rays - Follow-up PT/OT outpatient - Avoid NSAID's with history of prior hemorrhagic CVA  Menopause age 53 c/f Primary Ovarian Insufficiency 40 pack year smoking history Concern for osteoporosis/osteopenia given impressive fracture with relatively minor mechanism of injury, history of early menopause vs. POI, and smoking history. Vitamin D 25 hydroxy 34.11 within normal limits. - Recommend DEXA scan outpatient for concern of osteoporosis/osteopenia  Anemia likely 2/2 Blood Loss Hgb 11.6 down from 12.4 on admission. Likely due to blood loss from surgery. - CBC outpatient to monitor  Leukocytosis WBC 16.2 up from 14.4 yesterday and 14.1 on admission. Likely due to acute pain and stress. Patient was afebrile, HDS, and without signs or symptoms of infectious etiology throughout her hospitalization. 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 She endorsed dull, heavy pain at her surgical site and new 6/10-8/10 neck pain and soreness, which attributes to the pulling and stretching during surgery.   She asked questions about pain management at discharge and how oxycodone  may interact with her muscle relaxants. She expressed concern because of her history of hallucinations on these meds.  Discharge Exam:   BP 109/66 (BP Location: Right Arm)   Pulse 84   Temp 98.2 F (36.8 C)   Resp 18   Ht 5' 5 (1.651 m)   Wt 86.6 kg   SpO2 97%   BMI 31.78 kg/m  General: Well-appearing woman lying comfortably in bed. Ice pack on left trapezius. CV: RRR. Pulm: Normal work of breathing on RA. Extremities: Left  arm wrapped and splinted. Left fingers with improved movement compared with yesterday. Fingers and hands warm bilaterally, stable from yesterday.  Pertinent Labs, Studies, and Procedures:     Latest Ref Rng & Units 11/24/2023    6:11 AM 11/23/2023    6:09 AM 11/22/2023    9:18 AM  CBC  WBC 4.0 - 10.5 K/uL 16.2  14.4  14.1   Hemoglobin 12.0 - 15.0 g/dL 16.1  09.6  04.5   Hematocrit 36.0 - 46.0 % 35.3  37.1  39.5   Platelets 150 - 400 K/uL 341  354  343        Latest Ref Rng & Units 11/24/2023    6:11 AM 11/23/2023    6:09 AM 11/22/2023    9:18 AM  CMP  Glucose 70 - 99 mg/dL 409  811  914   BUN 6 - 20 mg/dL 11  11  9    Creatinine 0.44 - 1.00 mg/dL 7.82  9.56  2.13   Sodium 135 - 145 mmol/L 141  140  142   Potassium 3.5 - 5.1 mmol/L 3.7  3.4  3.7   Chloride 98 - 111 mmol/L 104  102  108   CO2 22 - 32 mmol/L 29  27  24    Calcium  8.9 - 10.3 mg/dL 9.4  9.1  9.4     DG Elbow 2 Views Left Result Date: 11/23/2023 CLINICAL DATA:  Elbow fracture. EXAM: LEFT ELBOW - 2 VIEW COMPARISON:  CT left femur 11/22/2023 FINDINGS: Interval postsurgical changes of distal medial and  lateral humeral plate and screw fixation. There is improved alignment of the comminuted supracondylar fracture of the distal humerus seen previously. Normal alignment at the radiocapitellar and trochlear-ulnar articulations. There is overlying cast material. IMPRESSION: Interval postsurgical changes of distal humeral plate and screw fixation with improved alignment of the comminuted supracondylar fracture of the distal humerus. Electronically Signed   By: Bertina Broccoli M.D.   On: 11/23/2023 15:53   DG ELBOW COMPLETE LEFT (3+VIEW) Result Date: 11/23/2023 CLINICAL DATA:  Open reduction internal fixation of left elbow. Intraoperative fluoroscopy. EXAM: LEFT ELBOW - COMPLETE 3+ VIEW COMPARISON:  Left elbow radiographs 11/22/2023, CT left humerus 11/22/2023 FINDINGS: Images were performed intraoperatively without the presence of a radiologist. The patient is undergoing medial and lateral distal humeral malleable plate and screw fixation of the previously seen displaced supracondylar fracture. Improved alignment. No hardware complication is seen. Total fluoroscopy images: 4 Total fluoroscopy time: 21 seconds Total dose: Radiation Exposure Index (as provided by the fluoroscopic device): 0.70 mGy air Kerma Please see intraoperative findings for further detail. IMPRESSION: Intraoperative fluoroscopy during open reduction internal fixation of the previously seen displaced supracondylar fracture. Electronically Signed   By: Bertina Broccoli M.D.   On: 11/23/2023 14:12   DG C-Arm 1-60 Min-No Report Result Date: 11/23/2023 Fluoroscopy was utilized by the requesting physician.  No radiographic interpretation.   DG C-Arm 1-60 Min-No Report Result Date: 11/23/2023 Fluoroscopy was utilized by the requesting physician.  No radiographic interpretation.   CT HUMERUS LEFT WO CONTRAST Result Date: 11/22/2023 CLINICAL DATA:  Fall. EXAM: CT OF THE UPPER LEFT EXTREMITY WITHOUT CONTRAST TECHNIQUE: Multidetector CT imaging of the  upper left extremity was performed according to the standard protocol. RADIATION DOSE REDUCTION: This exam was performed according to the departmental dose-optimization program which includes automated exposure control, adjustment of the mA and/or kV according to patient size and/or use of iterative reconstruction technique. COMPARISON:  Same day radiographs of the left  humerus and elbow dated 11/22/2023. FINDINGS: Bones/Joint/Cartilage Acute comminuted supracondylar fracture of the distal humerus extending from the medial distal supracondylar humeral diaphysis obliquely and distally through the lateral supracondylar distal humeral metaphysis (series 8, images 53 and 54). There is approximately 15 mm of medial displacement along the medial supracondylar fracture margin and 6 mm of posterior displacement at the lateral supracondylar fracture margin (series 3, images 117 and 120). The fracture margin extends through the olecranon fossa. No additional fracture identified. The visualized proximal radius and ulna appear intact. The radiocapitellar and ulnotrochlear joints appear anatomically aligned. Glenohumeral and acromioclavicular joints are anatomically aligned. Ligaments Ligaments are suboptimally evaluated by CT. Muscles and Tendons Muscles are normal. No muscle atrophy. No intramuscular fluid collection or hematoma. Soft tissue Overlying soft tissue splint in place. Soft tissue surrounding the supracondylar distal humeral fracture described above. No fluid collection or hematoma. No soft tissue mass. IMPRESSION: Acute comminuted and displaced oblique supracondylar fracture of the left distal humerus, as above. Electronically Signed   By: Mannie Seek M.D.   On: 11/22/2023 13:42   DG Chest Port 1 View Result Date: 11/22/2023 CLINICAL DATA:  Fall. EXAM: PORTABLE CHEST 1 VIEW COMPARISON:  July 30, 2023 FINDINGS: The heart size and mediastinal contours are within normal limits. Elevated left hemidiaphragm  is noted. Both lungs are clear. The visualized skeletal structures are unremarkable. IMPRESSION: No active disease. Electronically Signed   By: Rosalene Colon M.D.   On: 11/22/2023 09:43   DG Humerus Left Result Date: 11/22/2023 CLINICAL DATA:  Fall. EXAM: LEFT HUMERUS - 2+ VIEW COMPARISON:  None Available. FINDINGS: Severely displaced and comminuted fracture is seen involving the supracondylar portion of the distal left humerus. IMPRESSION: Severely displaced and comminuted fracture involving supracondylar portion of distal left humerus. Electronically Signed   By: Rosalene Colon M.D.   On: 11/22/2023 09:42   DG Elbow Complete Left Result Date: 11/22/2023 CLINICAL DATA:  Fall. EXAM: LEFT ELBOW - COMPLETE 3+ VIEW COMPARISON:  None Available. FINDINGS: Severely displaced and comminuted fracture is seen involving the supracondylar portion of distal humerus. Visualized portions of proximal radius and ulna are unremarkable. IMPRESSION: Severely displaced and comminuted fracture involving supracondylar portion of distal left humerus. Electronically Signed   By: Rosalene Colon M.D.   On: 11/22/2023 09:41   DG Shoulder Left Port Result Date: 11/22/2023 CLINICAL DATA:  Left shoulder pain after fall. EXAM: LEFT SHOULDER COMPARISON:  February 17, 2021. FINDINGS: Single view only was obtained. There is no evidence of fracture or dislocation. There is no evidence of arthropathy or other focal bone abnormality. Soft tissues are unremarkable. IMPRESSION: Negative. No definite abnormality seen involving left shoulder on this single projection. Electronically Signed   By: Rosalene Colon M.D.   On: 11/22/2023 09:40     Discharge Instructions: Discharge Instructions     Ambulatory referral to Occupational Therapy   Complete by: As directed    Ambulatory referral to Physical Therapy   Complete by: As directed    Diet - low sodium heart healthy   Complete by: As directed    Discharge wound care:   Complete by:  As directed    keep intact until follow up, change as needed if soiled or saturated.   Increase activity slowly   Complete by: As directed       Signed: [place], DO Internal Medicine Resident, PGY-2 Pager# [phone] 11/24/2023, 3:08 PM   Please contact the on call pager after 5 pm  and on weekends at [phone].

## 2023-11-24 NOTE — Evaluation (Addendum)
 Occupational Therapy Evaluation Patient Details Name: Shirley Hansen MRN: [id number] DOB: 1965-08-30 Today's Date: 11/24/2023   History of Present Illness   Pt is a 58 y.o female admitted 6/12 for L arm pain after falling out of bed. X-rays showed L distal humerus fx. S/p ORIF 6/13.  PMH: HTN, pancreatitis, CKD, CVA     Clinical Impressions Pt admitted based on above, and was seen based on problem list below. PTA pt was independent with ADLs and IADLs. Today pt is requiring s for safety to min assist for ADLs. Pt with good recall  of compensatory strategies and hemi techniques to complete ADLs. Bed mobility and functional transfers are  CGA. Pt presenting with decreased shoulder flex and abduction, along with pain with AROM. Education provided on donning/doffing sling. Per pt, adequate family support available upon d/c and reliable transportation, preference for outpatient OT. OT will continue to follow acutely to maximize functional independence.        If plan is discharge home, recommend the following:   A little help with walking and/or transfers;A little help with bathing/dressing/bathroom;Help with stairs or ramp for entrance     Functional Status Assessment   Patient has had a recent decline in their functional status and demonstrates the ability to make significant improvements in function in a reasonable and predictable amount of time.     Equipment Recommendations   None recommended by OT     Recommendations for Other Services         Precautions/Restrictions   Precautions Precautions: Fall Recall of Precautions/Restrictions: Intact Required Braces or Orthoses: Sling;Splint/Cast Splint/Cast: L Long arm splint Restrictions Weight Bearing Restrictions Per Provider Order: Yes LUE Weight Bearing Per Provider Order: Non weight bearing     Mobility Bed Mobility Overal bed mobility: Needs Assistance Bed Mobility: Supine to Sit     Supine to sit: Contact  guard, Used rails, HOB elevated     General bed mobility comments: Relied on rails    Transfers Overall transfer level: Needs assistance Equipment used: Rolling walker (2 wheels) Transfers: Sit to/from Stand, Bed to chair/wheelchair/BSC Sit to Stand: Contact guard assist, From elevated surface     Step pivot transfers: Contact guard assist, Supervision     General transfer comment: CGA to close supervision for mobility to bathroom      Balance Overall balance assessment: Needs assistance Sitting-balance support: No upper extremity supported, Feet supported Sitting balance-Leahy Scale: Fair     Standing balance support: Single extremity supported, During functional activity, Reliant on assistive device for balance Standing balance-Leahy Scale: Poor Standing balance comment: Reliant on RW       ADL either performed or assessed with clinical judgement   ADL Overall ADL's : Needs assistance/impaired Eating/Feeding: Set up;Sitting   Grooming: Wash/dry face;Oral care;Contact guard assist;Supervision/safety;Standing Grooming Details (indicate cue type and reason): Pt initally supervision for safety, as fatigue settles CGA     Upper Body Dressing : Minimal assistance;Sitting Upper Body Dressing Details (indicate cue type and reason): Assist for sling Lower Body Dressing: Sit to/from stand;Contact guard assist Lower Body Dressing Details (indicate cue type and reason): Pt able to figure 4 BLEs and complete task unilaterally Toilet Transfer: Supervision/safety;Rolling walker (2 wheels);Ambulation;BSC/3in1 Statistician Details (indicate cue type and reason): S for safety Toileting- Clothing Manipulation and Hygiene: Supervision/safety;Sitting/lateral lean Toileting - Clothing Manipulation Details (indicate cue type and reason): Pt able to wipe while seated     Functional mobility during ADLs: Supervision/safety;Contact guard assist;Rolling walker (2 wheels) General ADL  Comments: Pt with good recall of hemi techniques and compensatory strategies from previous CVA, as fatigue sets in she requires CGA and cues for safety     Vision Baseline Vision/History: 0 No visual deficits Patient Visual Report: No change from baseline Vision Assessment?: No apparent visual deficits            Pertinent Vitals/Pain Pain Assessment Pain Assessment: Faces Faces Pain Scale: Hurts even more Pain Location: LUE Pain Descriptors / Indicators: Discomfort, Grimacing Pain Intervention(s): Monitored during session, Repositioned     Extremity/Trunk Assessment Upper Extremity Assessment Upper Extremity Assessment: LUE deficits/detail LUE Deficits / Details: L distal humerus fx in long arm splint. No edema in fingers. Decreased shoulder flex/abd ~80 degrees, residual weakness from previous CVA LUE: Shoulder pain with ROM LUE Sensation: decreased light touch LUE Coordination: decreased fine motor   Lower Extremity Assessment Lower Extremity Assessment: Defer to PT evaluation   Cervical / Trunk Assessment Cervical / Trunk Assessment: Kyphotic   Communication Communication Communication: Impaired Factors Affecting Communication: Difficulty expressing self   Cognition Arousal: Alert Behavior During Therapy: WFL for tasks assessed/performed Cognition: No apparent impairments       Following commands: Intact       Cueing  General Comments   Cueing Techniques: Verbal cues;Tactile cues  VSS on RA    Home Living Family/patient expects to be discharged to:: Private residence Living Arrangements: Other relatives (Brother and niece) Available Help at Discharge: Family;Friend(s) Type of Home: House Home Access: Level entry     Home Layout: Multi-level;Bed/bath upstairs Alternate Level Stairs-Number of Steps: 15 Alternate Level Stairs-Rails: Right Bathroom Shower/Tub: Chief Strategy Officer: Standard Bathroom Accessibility: Yes How Accessible:  Accessible via walker Home Equipment: Rolling Walker (2 wheels);Cane - quad;BSC/3in1;Shower seat;Wheelchair - manual;Other (comment) (Hemi walker)          Prior Functioning/Environment Prior Level of Function : Independent/Modified Independent        OT Problem List: Decreased range of motion;Decreased strength;Decreased activity tolerance;Impaired balance (sitting and/or standing);Decreased safety awareness;Impaired UE functional use   OT Treatment/Interventions: Therapeutic exercise;Self-care/ADL training;Energy conservation;DME and/or AE instruction;Therapeutic activities;Patient/family education;Balance training      OT Goals(Current goals can be found in the care plan section)   Acute Rehab OT Goals Patient Stated Goal: To go home OT Goal Formulation: With patient Time For Goal Achievement: 12/08/23 Potential to Achieve Goals: Good ADL Goals Pt Will Perform Upper Body Dressing: with modified independence;sitting Pt Will Perform Lower Body Dressing: with modified independence;sit to/from stand Pt Will Transfer to Toilet: with modified independence;ambulating;regular height toilet Pt Will Perform Toileting - Clothing Manipulation and hygiene: with modified independence;sit to/from stand Additional ADL Goal #1: Pt will direct caregiver instructions on don/doff sling with supervision for safety   OT Frequency:  Min 2X/week       AM-PAC OT 6 Clicks Daily Activity     Outcome Measure Help from another person eating meals?: None Help from another person taking care of personal grooming?: A Little Help from another person toileting, which includes using toliet, bedpan, or urinal?: A Little Help from another person bathing (including washing, rinsing, drying)?: A Little Help from another person to put on and taking off regular upper body clothing?: A Little Help from another person to put on and taking off regular lower body clothing?: A Little 6 Click Score: 19   End of  Session Equipment Utilized During Treatment: Gait belt;Rolling walker (2 wheels);Other (comment) (sling) Nurse Communication: Mobility status  Activity Tolerance: Patient tolerated  treatment well Patient left: in chair;with call bell/phone within reach;with chair alarm set  OT Visit Diagnosis: Unsteadiness on feet (R26.81);Other abnormalities of gait and mobility (R26.89);Muscle weakness (generalized) (M62.81)                Time: 6644-0347 OT Time Calculation (min): 60 min Charges:  OT General Charges $OT Visit: 1 Visit OT Evaluation $OT Eval Moderate Complexity: 1 Mod OT Treatments $Self Care/Home Management : 38-52 mins  Delmer Ferraris, OT  Acute Rehabilitation Services Office [phone] Secure chat preferred   Mickael Alamo 11/24/2023, 1:05 PM

## 2023-11-24 NOTE — Progress Notes (Signed)
     1 Day Post-Op Procedure(s) (LRB): OPEN REDUCTION INTERNAL FIXATION (ORIF) DISTAL HUMERUS FRACTURE (Left) Subjective:  Patient reports pain as mild to moderate, depends on position.  Slept okay.  No overnight events.  Currently mobilizing with PT in therapy room.  Denies chest pain, ShOB, N/V.  Wearing sling.  Denies numbness or tingling.  No other complaints at this time.  Objective:   VITALS:   Vitals:   11/23/23 1415 11/23/23 1941 11/24/23 0443 11/24/23 0756  BP: 102/79 104/67 124/87 109/66  Pulse: 82 95 86 84  Resp: 14 16 16 18   Temp: 97.9 F (36.6 C) 97.8 F (36.6 C) 97.7 F (36.5 C) 98.2 F (36.8 C)  TempSrc:      SpO2: 95% 96% 97% 97%  Weight:      Height:        AAOx4 sitting comfortably in PT room, in NAD MSK:  Long arm splint in place + sling, limiting exam No significant swelling proximal or distal to splint Wiggles fingers appropriately PIN, AIN, IO grossly intact Splint dry, clean, and intact Fingers warm, well-perfused   Lab Results  Component Value Date   WBC 16.2 (H) 11/24/2023   HGB 11.6 (L) 11/24/2023   HCT 35.3 (L) 11/24/2023   MCV 88.0 11/24/2023   PLT 341 11/24/2023   BMET    Component Value Date/Time   NA 141 11/24/2023 0611   NA 144 10/17/2021 1603   K 3.7 11/24/2023 0611   CL 104 11/24/2023 0611   CO2 29 11/24/2023 0611   GLUCOSE 151 (H) 11/24/2023 0611   BUN 11 11/24/2023 0611   BUN 7 10/17/2021 1603   CREATININE 0.93 11/24/2023 0611   CALCIUM  9.4 11/24/2023 0611   EGFR 82 10/17/2021 1603   GFRNONAA >60 11/24/2023 0611     Xray: stable post-operative imaging  Assessment/Plan: 1 Day Post-Op   Principal Problem:   Supracondylar fracture of humerus, left, open, initial encounter Active Problems:   History of CVA (cerebrovascular accident)   Early menopause occurring in patient age younger than 45 years   Open supracondylar fracture of left humerus   Advance diet Up with therapy Incentive Spirometry Elevate and  Apply ice   Pre-operative leukocytosis, mildly elevated after surgery now 16.2.  Afebrile, HDS, and asymptomatic.  Mild post-operative anemia, Hgb 11.6.  Patient mobilizing well in PT per physical therapist.  Post op recs: WB: NWB LUE Abx: ancef x23 hours post op Imaging: PACU xrays Dressing: keep intact until follow up, change PRN if soiled or saturated. DVT prophylaxis: Aspirin  Follow up: 2 weeks after surgery for a wound check with Dr. Curtiss Dowdy  DC: Per medicine   Albertus Alt 11/24/2023, 10:12 AM   Contact information:   QMVHQION 7am-5pm epic message Dr. Pryor Browning, or call office for patient follow up: [phone] After hours and holidays please check [email or web address] for group call information for [facility]

## 2023-11-24 NOTE — Evaluation (Signed)
 Physical Therapy Evaluation and Discharge Patient Details Name: Shirley Hansen MRN: [id number] DOB: 10-28-1965 Today's Date: 11/24/2023  History of Present Illness  Pt is a 58 y.o female admitted 6/12 for L arm pain after falling out of bed. X-rays showed L distal humerus fx. S/p ORIF 6/13.  PMH: HTN, pancreatitis, CKD, CVA  Clinical Impression  Patient evaluated by Physical Therapy with no further acute PT needs identified, as plan is to dc today;  All education has been completed and the patient has no further questions. We covered gait with hemiwalker, and managing hemiwalker going up and down steps; Pt is excellent at problem-solving, and tells me she has plenty of help at home;  See below for any follow-up Physical Therapy or equipment needs. PT is signing off. Thank you for this referral.         If plan is discharge home, recommend the following: A little help with walking and/or transfers;A little help with bathing/dressing/bathroom;Assistance with cooking/housework;Help with stairs or ramp for entrance   Can travel by private vehicle        Equipment Recommendations Other (comment) (Hemiwalker (one for upstairs and one for downstairs)  Recommendations for Other Services       Functional Status Assessment Patient has had a recent decline in their functional status and demonstrates the ability to make significant improvements in function in a reasonable and predictable amount of time.     Precautions / Restrictions Precautions Precautions: Fall Recall of Precautions/Restrictions: Intact Required Braces or Orthoses: Sling;Splint/Cast Splint/Cast: L Long arm splint Restrictions Weight Bearing Restrictions Per Provider Order: Yes LUE Weight Bearing Per Provider Order: Non weight bearing      Mobility  Bed Mobility                    Transfers Overall transfer level: Needs assistance Equipment used: Rolling walker (2 wheels) Transfers: Sit to/from Stand, Bed to  chair/wheelchair/BSC Sit to Stand: Contact guard assist, From elevated surface   Step pivot transfers: Contact guard assist, Supervision       General transfer comment: CGA to close supervision to rise from recliner    Ambulation/Gait Ambulation/Gait assistance: Contact guard assist Gait Distance (Feet): 50 Feet Assistive device: Hemi-walker Gait Pattern/deviations: Steppage, Decreased stance time - left, Decreased step length - right       General Gait Details: Using hemiwalker in R hand; hemiparetic gait, with use of flexor synergy for LLE advancement; no buckling noted in L single limb stance  Stairs Stairs: Yes Stairs assistance: Contact guard assist Stair Management: One rail Right, Sideways Number of Stairs: 10 General stair comments: incr time to work through Dentist, including hwo to have hemiwalker with ehr without using LUE to hold it; Used theraband to make a strap to carry hemiwalker like a cross-body bag  Wheelchair Mobility     Tilt Bed    Modified Rankin (Stroke Patients Only)       Balance Overall balance assessment: Needs assistance Sitting-balance support: No upper extremity supported, Feet supported Sitting balance-Leahy Scale: Fair     Standing balance support: Single extremity supported, During functional activity, Reliant on assistive device for balance Standing balance-Leahy Scale: Poor Standing balance comment: Reliant on RW                             Pertinent Vitals/Pain Pain Assessment Pain Assessment: Faces Faces Pain Scale: Hurts even more Pain Location: LUE Pain Descriptors / Indicators:  Discomfort, Grimacing Pain Intervention(s): Monitored during session    Home Living Family/patient expects to be discharged to:: Private residence Living Arrangements: Other relatives (Brother and niece) Available Help at Discharge: Family;Friend(s) Type of Home: House Home Access: Level entry     Alternate Level  Stairs-Number of Steps: 15 Home Layout: Multi-level;Bed/bath upstairs Home Equipment: Agricultural consultant (2 wheels);Cane - quad;BSC/3in1;Shower seat;Wheelchair - Careers adviser (comment) (Hemi walker)      Prior Function Prior Level of Function : Independent/Modified Independent                     Extremity/Trunk Assessment   Upper Extremity Assessment Upper Extremity Assessment: Defer to OT evaluation LUE Deficits / Details: L distal humerus fx in long arm splint. No edema in fingers. Decreased shoulder flex/abd ~80 degrees, residual weakness from previous CVA LUE: Shoulder pain with ROM LUE Sensation: decreased light touch LUE Coordination: decreased fine motor    Lower Extremity Assessment Lower Extremity Assessment: RLE deficits/detail;LLE deficits/detail RLE Deficits / Details: RLE grossly WFL LLE Deficits / Details: L side effected by previous CVA; advances LLE with gross flexor synergy, uncoordinated steps; Still, adequate strength to support body weight without knee and/or hip flexion in single limb stance, and adequate strength for stair negotiation    Cervical / Trunk Assessment Cervical / Trunk Assessment: Kyphotic  Communication   Communication Communication: Impaired Factors Affecting Communication: Difficulty expressing self    Cognition Arousal: Alert Behavior During Therapy: WFL for tasks assessed/performed                             Following commands: Intact       Cueing Cueing Techniques: Verbal cues, Tactile cues     General Comments General comments (skin integrity, edema, etc.): NAD on RA    Exercises     Assessment/Plan    PT Assessment All further PT needs can be met in the next venue of care  PT Problem List Decreased strength;Decreased range of motion;Decreased activity tolerance;Decreased balance;Decreased mobility;Decreased coordination;Decreased cognition;Decreased knowledge of use of DME;Decreased safety  awareness;Decreased knowledge of precautions;Pain       PT Treatment Interventions      PT Goals (Current goals can be found in the Care Plan section)  Acute Rehab PT Goals Patient Stated Goal: Home today PT Goal Formulation: All assessment and education complete, DC therapy    Frequency       Co-evaluation               AM-PAC PT 6 Clicks Mobility  Outcome Measure Help needed turning from your back to your side while in a flat bed without using bedrails?: A Little Help needed moving from lying on your back to sitting on the side of a flat bed without using bedrails?: A Little Help needed moving to and from a bed to a chair (including a wheelchair)?: A Little Help needed standing up from a chair using your arms (e.g., wheelchair or bedside chair)?: A Little Help needed to walk in hospital room?: A Little Help needed climbing 3-5 steps with a railing? : A Little 6 Click Score: 18    End of Session Equipment Utilized During Treatment: Gait belt Activity Tolerance: Patient tolerated treatment well Patient left: in chair;with call bell/phone within reach;with chair alarm set Nurse Communication: Mobility status PT Visit Diagnosis: Unsteadiness on feet (R26.81);Other abnormalities of gait and mobility (R26.89);Hemiplegia and hemiparesis Hemiplegia - Right/Left: Left Hemiplegia - dominant/non-dominant: Non-dominant  Hemiplegia - caused by: Cerebral infarction    Time: 0981-1914 PT Time Calculation (min) (ACUTE ONLY): 60 min   Charges:   PT Evaluation $PT Eval Moderate Complexity: 1 Mod PT Treatments $Gait Training: 23-37 mins $Therapeutic Activity: 8-22 mins PT General Charges $$ ACUTE PT VISIT: 1 Visit         Darcus Eastern, PT  Acute Rehabilitation Services Office [phone] Secure Chat welcomed   Marcial Setting 11/24/2023, 3:52 PM

## 2023-11-24 NOTE — TOC Transition Note (Signed)
 Transition of Care [facility]) - Discharge Note   Patient Details  Name: Shirley Hansen MRN: [id number] Date of Birth: July 06, 1965  [facility] of [facility]) CM/SW Contact:  Jannine Meo, RN Phone Number: 11/24/2023, 4:26 PM   Clinical Narrative:  Patient is being discharged today. Outpatient physical and occupational therapy referral # [id number], placed for [facility], where she is already established. Patient has referral packet for her to take to [facility] at her next appt.      Final next level of care: Home/Self Care Barriers to Discharge: No Barriers Identified   Patient Goals and CMS Choice            Discharge Placement                       Discharge Plan and Services Additional resources added to the After Visit Summary for                                       Social Drivers of Health (SDOH) Interventions SDOH Screenings   Food Insecurity: No Food Insecurity (08/26/2022)   Received from [facility]  Transportation Needs: No Transportation Needs (08/26/2022)   Received from [facility]  Utilities: Not At Risk (11/22/2023)  Depression (PHQ2-9): Low Risk  (04/20/2021)  Recent Concern: Depression (PHQ2-9) - Medium Risk (04/05/2021)  Financial Resource Strain: Low Risk  (08/26/2022)   Received from [facility]  Physical Activity: Inactive (10/06/2020)   Received from [facility]  Social Connections: Moderately Isolated (10/06/2020)   Received from [facility]  Stress: No Stress Concern Present (10/06/2020)   Received from [facility]  Tobacco Use: Medium Risk (11/23/2023)     Readmission Risk Interventions     No data to display

## 2023-11-24 NOTE — Progress Notes (Incomplete)
 Summary:  Shirley Hansen is a 58 yo female with pertinent PMH of CVA (2021) with resulting left hemiparesis, early menopause (age 31), 62 pack-year smoking history, NSTEMI (2017), CAD, HTN, and HLD who presents with supracondylar fracture of left humerus. Orthopedics requested medicine admission due to prior encephalopathy, possibly secondary to Flexeril  in 2022.   Subjective:  No acute events overnight.  Shirley Hansen reported that her surgery went well. She endorsed dull, heavy pain at her surgical site and new 6/10-8/10 neck pain and soreness, which attributes to the pulling and stretching during surgery.  She asked questions about pain management at discharge and how oxycodone  may interact with her muscle relaxants. She expressed concern because of her history of hallucinations on these meds.  Objective: Vital signs in last 24 hours: Vitals:   11/23/23 1415 11/23/23 1941 11/24/23 0443 11/24/23 0756  BP: 102/79 104/67 124/87 109/66  Pulse: 82 95 86 84  Resp: 14 16 16 18   Temp: 97.9 F (36.6 C) 97.8 F (36.6 C) 97.7 F (36.5 C) 98.2 F (36.8 C)  TempSrc:      SpO2: 95% 96% 97% 97%  Weight:      Height:       Physical Exam:  General: Well-appearing woman lying comfortably in bed. Ice pack on left trapezius. CV: RRR. Pulm: Normal work of breathing on RA. Extremities: Left arm wrapped and splinted. Left fingers with improved movement compared with yesterday. Fingers and hands warm bilaterally, stable from yesterday.      Latest Ref Rng & Units 11/24/2023    6:11 AM 11/23/2023    6:09 AM 11/22/2023    9:18 AM  CBC  WBC 4.0 - 10.5 K/uL 16.2  14.4  14.1   Hemoglobin 12.0 - 15.0 g/dL 16.1  09.6  04.5   Hematocrit 36.0 - 46.0 % 35.3  37.1  39.5   Platelets 150 - 400 K/uL 341  354  343       Latest Ref Rng & Units 11/24/2023    6:11 AM 11/23/2023    6:09 AM 11/22/2023    9:18 AM  CMP  Glucose 70 - 99 mg/dL 409  811  914   BUN 6 - 20 mg/dL 11  11  9    Creatinine 0.44 - 1.00 mg/dL 7.82   9.56  2.13   Sodium 135 - 145 mmol/L 141  140  142   Potassium 3.5 - 5.1 mmol/L 3.7  3.4  3.7   Chloride 98 - 111 mmol/L 104  102  108   CO2 22 - 32 mmol/L 29  27  24    Calcium  8.9 - 10.3 mg/dL 9.4  9.1  9.4    Vitamin D 25 hydroxy: 34.11 ng/mL within normal limits.   Assessment/Plan: Ms. Paquette is a 58 yo female with pertinent PMH of CVA (2021) with resulting left hemiparesis, early menopause (age 68), 6 pack-year smoking history, NSTEMI (2017), CAD, HTN, and HLD who presents with supracondylar fracture of left humerus s/p closed reduction and uncomplicated ORIF.  Supracondylar fracture of left humerus s/p closed reduction and uncomplicated ORIF Surgery was uncomplicated and upper extremity exam stable.  Pending PT/OT consultations, patient medically cleared for discharge. Plan: - Oxycodone  5 mg Q4H PRN - Tylenol  1000 mg Q8H PRN - ASA 81 mg BID for *** for VTE ppx - Ancef 2 g Q8H for 1 day (3 doses total, 2 doses given inpatient) - Follow post-op recommendations - Splint for one week - Follow-up x-rays - Follow-up PT  and OT - OT recommending HHOT - PT consultation ordered - Avoid NSAID's with history of prior hemorrhagic CVA  Menopause age 6 c/f Primary Ovarian Insufficiency 40 pack year smoking history Concern for osteoporosis/osteopenia given impressive fracture with relatively minor mechanism of injury, history of early menopause vs. POI, and smoking history. Vitamin D 25 hydroxy 34.11 within normal limits. - Recommend DEXA scan outpatient for concern of osteoporosis/osteopenia  Anemia likely 2/2 Blood Loss Hgb 11.6 down from 12.4 on admission. Likely due to blood loss from surgery. - CBC outpatient to monitor  Leukocytosis WBC 16.2 up from 14.4 yesterday and 14.1 on admission. Likely due to acute pain and stress. Still without signs or symptoms of infectious etiology. 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 IVF: None. Barriers to Discharge: PT and OT evaluations. Dispo: Anticipated discharge this afternoon.  Lynnea Satchel, MS3

## 2023-11-26 ENCOUNTER — Encounter (HOSPITAL_COMMUNITY): Payer: Self-pay | Admitting: Student

## 2023-11-30 NOTE — Progress Notes (Signed)
 To clarify her fracture coding diagnosis: Traumatic fracture of left humerus due to fall from bed   Shirley Brooms, DO

## 2023-12-03 ENCOUNTER — Encounter: Admitting: Obstetrics and Gynecology

## 2023-12-06 ENCOUNTER — Encounter (HOSPITAL_COMMUNITY): Payer: Self-pay

## 2023-12-06 ENCOUNTER — Other Ambulatory Visit: Payer: Self-pay

## 2023-12-06 ENCOUNTER — Emergency Department (HOSPITAL_COMMUNITY)

## 2023-12-06 ENCOUNTER — Observation Stay (HOSPITAL_COMMUNITY)
Admission: EM | Admit: 2023-12-06 | Discharge: 2023-12-09 | Disposition: A | Discharge status: Home / Self Care | Attending: Internal Medicine | Admitting: Internal Medicine

## 2023-12-06 DIAGNOSIS — I129 Hypertensive chronic kidney disease with stage 1 through stage 4 chronic kidney disease, or unspecified chronic kidney disease: Secondary | ICD-10-CM | POA: Diagnosis not present

## 2023-12-06 DIAGNOSIS — M5459 Other low back pain: Principal | ICD-10-CM | POA: Insufficient documentation

## 2023-12-06 DIAGNOSIS — F17210 Nicotine dependence, cigarettes, uncomplicated: Secondary | ICD-10-CM | POA: Diagnosis not present

## 2023-12-06 DIAGNOSIS — M79602 Pain in left arm: Principal | ICD-10-CM

## 2023-12-06 DIAGNOSIS — E782 Mixed hyperlipidemia: Secondary | ICD-10-CM | POA: Diagnosis not present

## 2023-12-06 DIAGNOSIS — E041 Nontoxic single thyroid nodule: Secondary | ICD-10-CM | POA: Diagnosis not present

## 2023-12-06 DIAGNOSIS — I10 Essential (primary) hypertension: Secondary | ICD-10-CM | POA: Diagnosis present

## 2023-12-06 DIAGNOSIS — Z7982 Long term (current) use of aspirin: Secondary | ICD-10-CM | POA: Insufficient documentation

## 2023-12-06 DIAGNOSIS — M549 Dorsalgia, unspecified: Secondary | ICD-10-CM | POA: Diagnosis present

## 2023-12-06 DIAGNOSIS — M25562 Pain in left knee: Secondary | ICD-10-CM | POA: Diagnosis not present

## 2023-12-06 DIAGNOSIS — N1831 Chronic kidney disease, stage 3a: Secondary | ICD-10-CM | POA: Insufficient documentation

## 2023-12-06 DIAGNOSIS — R000 Tachycardia, unspecified: Secondary | ICD-10-CM | POA: Diagnosis not present

## 2023-12-06 DIAGNOSIS — G8918 Other acute postprocedural pain: Secondary | ICD-10-CM | POA: Diagnosis not present

## 2023-12-06 DIAGNOSIS — W19XXXA Unspecified fall, initial encounter: Secondary | ICD-10-CM | POA: Insufficient documentation

## 2023-12-06 DIAGNOSIS — S42412B Displaced simple supracondylar fracture without intercondylar fracture of left humerus, initial encounter for open fracture: Secondary | ICD-10-CM | POA: Insufficient documentation

## 2023-12-06 LAB — COMPREHENSIVE METABOLIC PANEL WITH GFR
ALT: 21 U/L (ref 0–44)
AST: 25 U/L (ref 15–41)
Albumin: 4.1 g/dL (ref 3.5–5.0)
Alkaline Phosphatase: 93 U/L (ref 38–126)
Anion gap: 17 — ABNORMAL HIGH (ref 5–15)
BUN: 8 mg/dL (ref 6–20)
CO2: 21 mmol/L — ABNORMAL LOW (ref 22–32)
Calcium: 9.4 mg/dL (ref 8.9–10.3)
Chloride: 103 mmol/L (ref 98–111)
Creatinine, Ser: 0.76 mg/dL (ref 0.44–1.00)
GFR, Estimated: >60 mL/min (ref >60)
Glucose, Bld: 87 mg/dL (ref 70–99)
Potassium: 3.9 mmol/L (ref 3.5–5.1)
Sodium: 141 mmol/L (ref 135–145)
Total Bilirubin: 1.4 mg/dL — ABNORMAL HIGH (ref 0.0–1.2)
Total Protein: 8.5 g/dL — ABNORMAL HIGH (ref 6.5–8.1)

## 2023-12-06 LAB — CBC WITH DIFFERENTIAL/PLATELET
Abs Immature Granulocytes: 0.05 10*3/uL (ref 0.00–0.07)
Basophils Absolute: 0.1 10*3/uL (ref 0.0–0.1)
Basophils Relative: 1 %
Eosinophils Absolute: 0.1 10*3/uL (ref 0.0–0.5)
Eosinophils Relative: 1 %
HCT: 41.6 % (ref 36.0–46.0)
Hemoglobin: 14.1 g/dL (ref 12.0–15.0)
Immature Granulocytes: 1 %
Lymphocytes Relative: 19 %
Lymphs Abs: 1.9 10*3/uL (ref 0.7–4.0)
MCH: 29.3 pg (ref 26.0–34.0)
MCHC: 33.9 g/dL (ref 30.0–36.0)
MCV: 86.3 fL (ref 80.0–100.0)
Monocytes Absolute: 0.7 10*3/uL (ref 0.1–1.0)
Monocytes Relative: 6 %
Neutro Abs: 7.7 10*3/uL (ref 1.7–7.7)
Neutrophils Relative %: 72 %
Platelets: 425 10*3/uL — ABNORMAL HIGH (ref 150–400)
RBC: 4.82 MIL/uL (ref 3.87–5.11)
RDW: 13.5 % (ref 11.5–15.5)
WBC: 10.5 10*3/uL (ref 4.0–10.5)
nRBC: 0 % (ref 0.0–0.2)

## 2023-12-06 LAB — TROPONIN I (HIGH SENSITIVITY): Troponin I (High Sensitivity): 6 ng/L (ref <18)

## 2023-12-06 MED ORDER — ONDANSETRON HCL 4 MG/2ML IJ SOLN
4.0000 mg | Freq: Once | INTRAMUSCULAR | Status: AC
  Administered 2023-12-06: 4 mg via INTRAVENOUS
  Filled 2023-12-06: qty 2

## 2023-12-06 MED ORDER — HYDROMORPHONE HCL 1 MG/ML IJ SOLN: 0.5000 mg | Freq: Once | INTRAMUSCULAR | Status: DC

## 2023-12-06 MED ORDER — HYDROMORPHONE HCL 1 MG/ML IJ SOLN
1.0000 mg | Freq: Once | INTRAMUSCULAR | Status: AC
  Administered 2023-12-06: 1 mg via INTRAVENOUS
  Filled 2023-12-06: qty 1

## 2023-12-06 MED ORDER — KETOROLAC TROMETHAMINE 30 MG/ML IJ SOLN
15.0000 mg | Freq: Once | INTRAMUSCULAR | Status: AC
  Administered 2023-12-06: 15 mg via INTRAVENOUS
  Filled 2023-12-06: qty 1

## 2023-12-06 MED ORDER — LIDOCAINE 5 % EX PTCH
1.0000 | MEDICATED_PATCH | CUTANEOUS | Status: DC
  Administered 2023-12-06 – 2023-12-08 (×3): 1 via TRANSDERMAL
  Filled 2023-12-06 (×3): qty 1

## 2023-12-06 MED ORDER — KETOROLAC TROMETHAMINE 30 MG/ML IJ SOLN: 30.0000 mg | Freq: Once | INTRAMUSCULAR | Status: DC

## 2023-12-06 MED ORDER — METHOCARBAMOL 500 MG PO TABS
500.0000 mg | ORAL_TABLET | Freq: Once | ORAL | Status: AC
  Administered 2023-12-06: 500 mg via ORAL
  Filled 2023-12-06: qty 1

## 2023-12-06 NOTE — ED Triage Notes (Signed)
 EMS reports from home, called out for dizziness and arm pain and headache. Pt had fall and Fx L arm last week. Having increasing difficulty with mobility. Prior stroke with L side deficit.   BP 133/94 HR 108 RR 20 Sp02 100 RA Temp 96.5   18ga R forearm LR enroute

## 2023-12-06 NOTE — ED Notes (Signed)
 Pt in the bed speaking on her phone. Pt oriented x 4. Pt call bell within reach and bed locked in lowest position

## 2023-12-06 NOTE — ED Provider Notes (Signed)
 [place] EMERGENCY DEPARTMENT AT [facility] Provider Note   CSN: [id number] Arrival date & time: 12/06/23  1804     History  Chief Complaint  Patient presents with   Arm Pain   Dizziness   Headache    Shirley Hansen is a 58 y.o. female with PMH as listed below who presents BIB EMS reports from home, called out for dizziness and arm pain and headache. Pt had fall and Fx L arm last week, an open supracondylar humeral fx treated w/ ORIF. Today she started having severe pain in the arm radiaing up into her shoulder and left side of her head. Much worse with movement. Denies numbness/tingling or new trauma. Having increasing difficulty with mobility. Prior stroke with L side deficit.    BP 133/94 HR 108 RR 20 Sp02 100 RA Temp 96.5     18ga R forearm LR enroute.    Past Medical History:  Diagnosis Date   Hypertension    Pancreatitis    Stage 3a chronic kidney disease (HCC)    Stroke (HCC)        Home Medications Prior to Admission medications   Medication Sig Start Date End Date Taking? Authorizing Provider  albuterol  (VENTOLIN  HFA) 108 (90 Base) MCG/ACT inhaler Inhale 2 puffs into the lungs every 2 (two) hours as needed for wheezing or shortness of breath. 07/28/20  Yes Ghimire, Donalda HERO, MD  amLODipine  (NORVASC ) 10 MG tablet TAKE 1 TABLET BY MOUTH EVERY DAY Patient taking differently: Take 10 mg by mouth in the morning. 01/03/22  Yes Newlin, Enobong, MD  aspirin  EC 81 MG tablet Take 81 mg by mouth in the morning. Swallow whole.   Yes [provider]  atorvastatin  (LIPITOR ) 80 MG tablet Take 1 tablet (80 mg total) by mouth at bedtime. 10/20/21 12/07/23 Yes Theotis Haze ORN, NP  Baclofen  5 MG TABS Take 5 mg by mouth at bedtime.   Yes [provider]  carvedilol  (COREG ) 25 MG tablet Take 1 tablet (25 mg total) by mouth 2 (two) times daily. 10/20/21  Yes Theotis Haze ORN, NP  Cholecalciferol  (VITAMIN D3) 50 MCG (2000 UT) capsule Take 2,000  Units by mouth every morning. 04/17/22  Yes [provider]  cyanocobalamin  2000 MCG tablet Take 2,000 mcg by mouth in the morning.   Yes [provider]  dorzolamide -timolol  (COSOPT ) 22.3-6.8 MG/ML ophthalmic solution Place 1 drop into both eyes 2 (two) times daily. 05/31/20  Yes [provider]  DULoxetine  (CYMBALTA ) 30 MG capsule Take 1 capsule (30 mg total) by mouth at bedtime. 11/24/23  Yes Jolaine Pac, DO  famotidine  (PEPCID ) 20 MG tablet Take 20 mg by mouth at bedtime.   Yes [provider]  naproxen  (NAPROSYN ) 500 MG tablet Take 1 tablet (500 mg total) by mouth 2 (two) times daily as needed for mild pain (pain score 1-3) or moderate pain (pain score 4-6). 12/09/23 12/08/24 Yes Shalhoub, Zachary PARAS, MD  Netarsudil  Dimesylate 0.02 % SOLN Place 1 drop into both eyes at bedtime. Rhopressa 02/07/21  Yes [provider]  pregabalin  (LYRICA ) 150 MG capsule Take 150 mg by mouth in the morning.   Yes [provider]  pregabalin  (LYRICA ) 200 MG capsule Take 200 mg by mouth at bedtime.   Yes [provider]  lidocaine  (LIDODERM ) 5 % Place 1 patch onto the skin daily. Remove & Discard patch within 12 hours or as directed by MD 12/09/23   Shalhoub, Zachary PARAS, MD  Menthol ,  Topical Analgesic, (BIOFREEZE EX) Apply 1 Application topically as needed.    [provider]  methocarbamol  1000 MG TABS Take 1,000 mg by mouth every 8 (eight) hours as needed for muscle spasms. 12/09/23   Shalhoub, Zachary PARAS, MD  oxyCODONE  (OXY IR/ROXICODONE ) 5 MG immediate release tablet Take 1 tablet (5 mg total) by mouth every 4 (four) hours as needed for severe pain (pain score 7-10). 12/09/23   Shalhoub, Zachary PARAS, MD      Allergies    Patient has no known allergies.    Review of Systems   Review of Systems A 10 point review of systems was performed and is negative unless otherwise reported in HPI.  Physical Exam Updated Vital Signs BP 111/86 (BP Location:  Right Arm)   Pulse 81   Temp 97.7 F (36.5 C) (Oral)   Resp 17   Ht 5' 5 (1.651 m)   Wt 85.8 kg   SpO2 97%   BMI 31.48 kg/m  Physical Exam General: Uncomfortable, tearful appearing female, lying in bed.  HEENT: PERRLA, Sclera anicteric, MMM, trachea midline.  Cardiology: RRR, no murmurs/rubs/gallops. No chest wall TTP.  Resp: Normal respiratory rate and effort. CTAB, no wheezes, rhonchi, crackles.  Abd: Soft, non-tender, non-distended. No rebound tenderness or guarding.  GU: Deferred. MSK: Well-healing surgical incision in the distal humerus with no surrounding erythema, induration, fluctuance.  No purulent drainage.  Significant tenderness to palpation in the upper arm, left trapezius, left sided neck musculature with no overlying deformities or signs of injury.  Compartments soft.  Distally neuro intact. Skin: warm, dry. Neuro: A&Ox4, CNs II-XII grossly intact. MAEs. Sensation grossly intact.  Psych: Normal mood and affect.   ED Results / Procedures / Treatments   Labs (all labs ordered are listed, but only abnormal results are displayed) Labs Reviewed  CBC WITH DIFFERENTIAL/PLATELET - Abnormal; Notable for the following components:      Result Value   Platelets 425 (*)    All other components within normal limits  COMPREHENSIVE METABOLIC PANEL WITH GFR - Abnormal; Notable for the following components:   CO2 21 (*)    Total Protein 8.5 (*)    Total Bilirubin 1.4 (*)    Anion gap 17 (*)    All other components within normal limits  URINALYSIS, ROUTINE W REFLEX MICROSCOPIC - Abnormal; Notable for the following components:   Specific Gravity, Urine >1.046 (*)    Ketones, ur 20 (*)    All other components within normal limits  MAGNESIUM   CBC WITH DIFFERENTIAL/PLATELET  MAGNESIUM   TROPONIN I (HIGH SENSITIVITY)  TROPONIN I (HIGH SENSITIVITY)    EKG EKG Interpretation Date/Time:  Thursday December 06 2023 19:52:09 EDT Ventricular Rate:  119 PR Interval:  119 QRS  Duration:  137 QT Interval:  344 QTC Calculation: 484 R Axis:   266  Text Interpretation: Sinus tachycardia RBBB and LAFB ST elevation, consider inferior injury Confirmed by Franklyn Gills [phone]) on 12/06/2023 9:10:46 PM  Radiology DG humerus L: 1. New overlying cast/splint obscuring fine detail. 2. Plates and screws in the distal humerus. Fracture lines are not well seen secondary to overlying external artifact.  Procedures Procedures    Medications Ordered in ED Medications  HYDROmorphone  (DILAUDID ) injection 1 mg (1 mg Intravenous Given 12/06/23 2132)  ondansetron  (ZOFRAN ) injection 4 mg (4 mg Intravenous Given 12/06/23 2132)  methocarbamol  (ROBAXIN ) tablet 500 mg (500 mg Oral Given 12/06/23 2315)  ketorolac  (TORADOL ) 30 MG/ML injection 15 mg (15 mg Intravenous Given 12/06/23 2313)  ED Course/ Medical Decision Making/ A&P                          Medical Decision Making Amount and/or Complexity of Data Reviewed Labs: ordered. Decision-making details documented in ED Course. Radiology: ordered. Decision-making details documented in ED Course.  Risk Prescription drug management. Decision regarding hospitalization.    This patient presents to the ED for concern of left shoulder arm and neck pain, this involves an extensive number of treatment options, and is a complaint that carries with it a high risk of complications and morbidity.  I considered the following differential and admission for this acute, potentially life threatening condition.  Patient is uncomfortable and tearful.  Otherwise hemodynamically stable, afebrile.  MDM:    After initiation of pain control, I unwrapped patient's splint to examine arm, and compartments are soft with well-healing surgical incision, no signs of infection there, good pulses/NVI, and splinting materials were replaced.  No indication of infection or compartment syndrome.  No new trauma and the x-ray does not demonstrate any acute  changes. Patient gestures to her left trapezius in the area of pain especially with arm movement and I can feel the muscle tensing up.  Pain is radiating up her neck and into the side of her head or in her temporalis muscle and I believe that her origin of pain is muscle spasms postop after her humeral fracture. Also considered atypical ACS, EKG and troponin are negative. High suspicion for muscular spasms causing pain given physical exam, will treat w/ dilaudid /zofran  and robaxin , toradol . Patient requiring multiple rounds of IV analgesia for pain control.   Clinical Course as of 12/11/23 1548  Thu Dec 06, 2023  2157 Troponin I (High Sensitivity): 6 neg [HN]  2246 DG Humerus Left FINDINGS: There is a new overlying cast/splint obscuring fine detail. Plates and screws are seen in the distal humerus. Fracture lines are not well seen secondary to overlying external artifact. Alignment is grossly unchanged. No evidence for dislocation.  IMPRESSION: 1. New overlying cast/splint obscuring fine detail. 2. Plates and screws in the distal humerus. Fracture lines are not well seen secondary to overlying external artifact.   [HN]    Clinical Course User Index [HN] Franklyn Sid SAILOR, MD    Labs: I Ordered, and personally interpreted labs.  The pertinent results include:  those listed above  Imaging Studies ordered: I ordered imaging studies including humerus XR I independently visualized and interpreted imaging. I agree with the radiologist interpretation  Additional history obtained from chart review.  Cardiac Monitoring: The patient was maintained on a cardiac monitor.  I personally viewed and interpreted the cardiac monitored which showed an underlying rhythm of: sinus tachycardia  Reevaluation: After the interventions noted above, I reevaluated the patient and found that they have :improved  Social Determinants of Health: Lives independently  Disposition:  Patient is signed out to  the oncoming ED physician Dr. Carita who is made aware of her history, presentation, exam, workup, and plan. Will possibly need to admit for pain control.   Co morbidities that complicate the patient evaluation  Past Medical History:  Diagnosis Date   Hypertension    Pancreatitis    Stage 3a chronic kidney disease (HCC)    Stroke (HCC)      Medicines Meds ordered this encounter  Medications   DISCONTD: HYDROmorphone  (DILAUDID ) injection 0.5 mg   HYDROmorphone  (DILAUDID ) injection 1 mg   ondansetron  (ZOFRAN ) injection 4 mg  methocarbamol  (ROBAXIN ) tablet 500 mg   DISCONTD: lidocaine  (LIDODERM ) 5 % 1 patch   DISCONTD: ketorolac  (TORADOL ) 30 MG/ML injection 30 mg   ketorolac  (TORADOL ) 30 MG/ML injection 15 mg   iohexol  (OMNIPAQUE ) 350 MG/ML injection 150 mL   HYDROmorphone  (DILAUDID ) injection 1 mg   DISCONTD: acetaminophen  (TYLENOL ) tablet 650 mg   DISCONTD: acetaminophen  (TYLENOL ) suppository 650 mg   DISCONTD: melatonin tablet 3 mg   DISCONTD: ondansetron  (ZOFRAN ) injection 4 mg   DISCONTD: naloxone  (NARCAN ) injection 0.4 mg   DISCONTD: HYDROmorphone  (DILAUDID ) injection 0.5 mg   DISCONTD: oxyCODONE -acetaminophen  (PERCOCET/ROXICET) 5-325 MG per tablet 1-2 tablet    Refill:  0   methocarbamol  (ROBAXIN ) injection 1,000 mg   DISCONTD: methocarbamol  (ROBAXIN ) injection 1,000 mg   DISCONTD: oxyCODONE  (Oxy IR/ROXICODONE ) immediate release tablet 10 mg    Refill:  0   DISCONTD: albuterol  (PROVENTIL ) (2.5 MG/3ML) 0.083% nebulizer solution 3 mL   DISCONTD: amLODipine  (NORVASC ) tablet 10 mg   DISCONTD: aspirin  EC tablet 81 mg    Swallow whole.     DISCONTD: atorvastatin  (LIPITOR ) tablet 80 mg   DISCONTD: baclofen  (LIORESAL ) tablet 5 mg   DISCONTD: carvedilol  (COREG ) tablet 25 mg   DISCONTD: cholecalciferol  (VITAMIN D3) tablet 2,000 Units   DISCONTD: cyanocobalamin  (VITAMIN B12) tablet 2,000 mcg   DISCONTD: dorzolamide -timolol  (COSOPT ) 2-0.5 % ophthalmic solution 1 drop    DISCONTD: DULoxetine  (CYMBALTA ) DR capsule 30 mg   DISCONTD: famotidine  (PEPCID ) tablet 20 mg   DISCONTD: pregabalin  (LYRICA ) capsule 150 mg   DISCONTD: pregabalin  (LYRICA ) capsule 200 mg   DISCONTD: dorzolamide  (TRUSOPT ) 2 % ophthalmic solution 1 drop   DISCONTD: timolol  (TIMOPTIC ) 0.5 % ophthalmic solution 1 drop   DISCONTD: naproxen  sodium (ANAPROX ) tablet 550 mg   DISCONTD: oxyCODONE  (Oxy IR/ROXICODONE ) immediate release tablet 5 mg    Refill:  0   DISCONTD: oxyCODONE  (Oxy IR/ROXICODONE ) immediate release tablet 10 mg    Refill:  0   DISCONTD: naproxen  (NAPROSYN ) tablet 500 mg   DISCONTD: methocarbamol  (ROBAXIN ) tablet 1,000 mg   DISCONTD: methocarbamol  (ROBAXIN ) tablet 1,000 mg   DISCONTD: alum & mag hydroxide-simeth (MAALOX/MYLANTA) 200-200-20 MG/5ML suspension 30 mL   potassium chloride  SA (KLOR-CON  M) CR tablet 40 mEq   naproxen  (NAPROSYN ) 500 MG tablet    Sig: Take 1 tablet (500 mg total) by mouth 2 (two) times daily as needed for mild pain (pain score 1-3) or moderate pain (pain score 4-6).    Dispense:  60 tablet    Refill:  2   lidocaine  (LIDODERM ) 5 %    Sig: Place 1 patch onto the skin daily. Remove & Discard patch within 12 hours or as directed by MD    Dispense:  30 patch    Refill:  2   methocarbamol  1000 MG TABS    Sig: Take 1,000 mg by mouth every 8 (eight) hours as needed for muscle spasms.    Dispense:  30 tablet    Refill:  0   oxyCODONE  (OXY IR/ROXICODONE ) 5 MG immediate release tablet    Sig: Take 1 tablet (5 mg total) by mouth every 4 (four) hours as needed for severe pain (pain score 7-10).    Dispense:  30 tablet    Refill:  0    I have reviewed the patients home medicines and have made adjustments as needed  Problem List / ED Course: Problem List Items Addressed This Visit       Other   * (Principal) Intractable back pain  Relevant Medications   naproxen  (NAPROSYN ) 500 MG tablet   methocarbamol  1000 MG TABS   oxyCODONE  (OXY IR/ROXICODONE ) 5 MG  immediate release tablet   Other Relevant Orders   Ambulatory referral to Physical Therapy   Other Visit Diagnoses       Left arm pain    -  Primary     Postoperative pain         Tachycardia                       This note was created using dictation software, which may contain spelling or grammatical errors.    Franklyn Sid SAILOR, MD 12/14/23 2212

## 2023-12-07 ENCOUNTER — Emergency Department (HOSPITAL_COMMUNITY)

## 2023-12-07 ENCOUNTER — Observation Stay (HOSPITAL_COMMUNITY)

## 2023-12-07 ENCOUNTER — Encounter (HOSPITAL_COMMUNITY): Payer: Self-pay

## 2023-12-07 DIAGNOSIS — M25562 Pain in left knee: Secondary | ICD-10-CM

## 2023-12-07 DIAGNOSIS — E041 Nontoxic single thyroid nodule: Secondary | ICD-10-CM

## 2023-12-07 DIAGNOSIS — I10 Essential (primary) hypertension: Secondary | ICD-10-CM

## 2023-12-07 DIAGNOSIS — M5459 Other low back pain: Secondary | ICD-10-CM | POA: Diagnosis not present

## 2023-12-07 DIAGNOSIS — M549 Dorsalgia, unspecified: Secondary | ICD-10-CM | POA: Diagnosis present

## 2023-12-07 DIAGNOSIS — E782 Mixed hyperlipidemia: Secondary | ICD-10-CM | POA: Diagnosis not present

## 2023-12-07 DIAGNOSIS — S42412S Displaced simple supracondylar fracture without intercondylar fracture of left humerus, sequela: Secondary | ICD-10-CM

## 2023-12-07 DIAGNOSIS — F17210 Nicotine dependence, cigarettes, uncomplicated: Secondary | ICD-10-CM

## 2023-12-07 LAB — TROPONIN I (HIGH SENSITIVITY): Troponin I (High Sensitivity): 10 ng/L (ref <18)

## 2023-12-07 LAB — URINALYSIS, ROUTINE W REFLEX MICROSCOPIC
Bilirubin Urine: NEGATIVE
Glucose, UA: NEGATIVE mg/dL
Hgb urine dipstick: NEGATIVE
Ketones, ur: 20 mg/dL — AB
Leukocytes,Ua: NEGATIVE
Nitrite: NEGATIVE
Protein, ur: NEGATIVE mg/dL
Specific Gravity, Urine: >1.046 — ABNORMAL HIGH (ref 1.005–1.030)
pH: 5 (ref 5.0–8.0)

## 2023-12-07 MED ORDER — ONDANSETRON HCL 4 MG/2ML IJ SOLN
4.0000 mg | Freq: Four times a day (QID) | INTRAMUSCULAR | Status: DC | PRN
  Administered 2023-12-08 – 2023-12-09 (×2): 4 mg via INTRAVENOUS
  Filled 2023-12-07 (×2): qty 2

## 2023-12-07 MED ORDER — HYDROMORPHONE HCL 1 MG/ML IJ SOLN
0.5000 mg | INTRAMUSCULAR | Status: DC | PRN
  Administered 2023-12-07 – 2023-12-08 (×2): 0.5 mg via INTRAVENOUS
  Filled 2023-12-07: qty 1
  Filled 2023-12-07: qty 0.5

## 2023-12-07 MED ORDER — METHOCARBAMOL 1000 MG/10ML IJ SOLN
1000.0000 mg | Freq: Three times a day (TID) | INTRAMUSCULAR | Status: AC
  Administered 2023-12-07 – 2023-12-08 (×3): 1000 mg via INTRAVENOUS
  Filled 2023-12-07 (×3): qty 10

## 2023-12-07 MED ORDER — OXYCODONE HCL 5 MG PO TABS
10.0000 mg | ORAL_TABLET | ORAL | Status: DC | PRN
  Administered 2023-12-07 – 2023-12-08 (×4): 10 mg via ORAL
  Filled 2023-12-07 (×4): qty 2

## 2023-12-07 MED ORDER — ACETAMINOPHEN 650 MG RE SUPP: 650.0000 mg | Freq: Four times a day (QID) | RECTAL | Status: DC | PRN

## 2023-12-07 MED ORDER — MELATONIN 3 MG PO TABS: 3.0000 mg | ORAL_TABLET | Freq: Every evening | ORAL | Status: DC | PRN

## 2023-12-07 MED ORDER — IOHEXOL 350 MG/ML SOLN
150.0000 mL | Freq: Once | INTRAVENOUS | Status: AC | PRN
  Administered 2023-12-07: 150 mL via INTRAVENOUS

## 2023-12-07 MED ORDER — NALOXONE HCL 0.4 MG/ML IJ SOLN: 0.4000 mg | INTRAMUSCULAR | Status: DC | PRN

## 2023-12-07 MED ORDER — ACETAMINOPHEN 325 MG PO TABS
650.0000 mg | ORAL_TABLET | Freq: Four times a day (QID) | ORAL | Status: DC | PRN
Start: 2023-12-07 — End: 2023-12-09

## 2023-12-07 MED ORDER — HYDROMORPHONE HCL 1 MG/ML IJ SOLN
1.0000 mg | Freq: Once | INTRAMUSCULAR | Status: AC
  Administered 2023-12-07: 1 mg via INTRAVENOUS
  Filled 2023-12-07: qty 1

## 2023-12-07 MED ORDER — METHOCARBAMOL 1000 MG/10ML IJ SOLN: 1000.0000 mg | Freq: Four times a day (QID) | INTRAMUSCULAR | Status: DC | PRN

## 2023-12-07 MED ORDER — OXYCODONE-ACETAMINOPHEN 5-325 MG PO TABS: 1.0000 | ORAL_TABLET | ORAL | Status: DC | PRN

## 2023-12-07 NOTE — ED Notes (Signed)
 Changed pts brief and bed linens

## 2023-12-07 NOTE — H&P (Signed)
 History and Physical    Patient: Shirley Hansen MRN: [id number] DOA: 12/06/2023  Date of Service: the patient was seen and examined on 12/07/2023  Patient coming from: {comingfrom:22515}  Chief Complaint:  Chief Complaint  Patient presents with   Arm Pain   Dizziness   Headache    HPI: ***  Review of Systems: ROS   Past Medical History:  Diagnosis Date   Hypertension    Pancreatitis    Stage 3a chronic kidney disease (HCC)    Stroke [facility])     Past Surgical History:  Procedure Laterality Date   cataract surgery Left 04/05/2023   OD   CORONARY ANGIOPLASTY WITH STENT PLACEMENT     ORIF HUMERUS FRACTURE Left 11/23/2023   Procedure: OPEN REDUCTION INTERNAL FIXATION (ORIF) DISTAL HUMERUS FRACTURE;  Surgeon: Kendal Franky SQUIBB, MD;  Location: [facility] OR;  Service: Orthopedics;  Laterality: Left;   TUBAL LIGATION      Social History:  reports that she has quit smoking. Her smoking use included cigarettes. She has never used smokeless tobacco. She reports that she does not currently use alcohol . She reports that she does not use drugs.  No Known Allergies  Family History  Problem Relation Age of Onset   Lung cancer Mother    Prostate cancer Father    Congestive Heart Failure Father    Colon cancer Neg Hx    Colon polyps Neg Hx    Esophageal cancer Neg Hx    Pancreatic cancer Neg Hx    Stomach cancer Neg Hx     Prior to Admission medications   Medication Sig Start Date End Date Taking? Authorizing Provider  albuterol  (VENTOLIN  HFA) 108 (90 Base) MCG/ACT inhaler Inhale 2 puffs into the lungs every 2 (two) hours as needed for wheezing or shortness of breath. 07/28/20  Yes Ghimire, Donalda HERO, MD  amLODipine  (NORVASC ) 10 MG tablet TAKE 1 TABLET BY MOUTH EVERY DAY Patient taking differently: Take 10 mg by mouth in the morning. 01/03/22  Yes Newlin, Enobong, MD  aspirin  EC 81 MG tablet Take 81 mg by mouth in the morning. Swallow whole.   Yes [provider]  atorvastatin   (LIPITOR ) 80 MG tablet Take 1 tablet (80 mg total) by mouth at bedtime. 10/20/21 12/07/23 Yes Theotis Haze ORN, NP  Baclofen  5 MG TABS Take 5 mg by mouth at bedtime.   Yes [provider]  carvedilol  (COREG ) 25 MG tablet Take 1 tablet (25 mg total) by mouth 2 (two) times daily. 10/20/21  Yes Fleming, Zelda W, NP  Cholecalciferol (VITAMIN D3) 50 MCG (2000 UT) capsule Take 2,000 Units by mouth every morning. 04/17/22  Yes [provider]  cyanocobalamin 2000 MCG tablet Take 2,000 mcg by mouth in the morning.   Yes [provider]  dorzolamide -timolol  (COSOPT ) 22.3-6.8 MG/ML ophthalmic solution Place 1 drop into both eyes 2 (two) times daily. 05/31/20  Yes [provider]  DULoxetine  (CYMBALTA ) 30 MG capsule Take 1 capsule (30 mg total) by mouth at bedtime. 11/24/23  Yes Jolaine Pac, DO  famotidine  (PEPCID ) 20 MG tablet Take 20 mg by mouth at bedtime.   Yes [provider]  ibuprofen (ADVIL) 800 MG tablet Take 800 mg by mouth daily as needed for mild pain (pain score 1-3) or moderate pain (pain score 4-6). Usually in the morning when not taking baclofen    Yes [provider]  Netarsudil  Dimesylate 0.02 % SOLN Place 1 drop into both eyes at bedtime. Rhopressa 02/07/21  Yes  [provider]  oxyCODONE -acetaminophen  (PERCOCET) 5-325 MG tablet Take 1 tablet by mouth every 4 (four) hours as needed for severe pain (pain score 7-10). 11/23/23  Yes Danton Lauraine LABOR, PA-C  pregabalin  (LYRICA ) 150 MG capsule Take 150 mg by mouth in the morning.   Yes [provider]  pregabalin  (LYRICA ) 200 MG capsule Take 200 mg by mouth at bedtime.   Yes [provider]  lidocaine  (LIDODERM ) 5 % Place 1 patch onto the skin daily. Remove & Discard patch within 12 hours or as directed by MD Patient not taking: Reported on 12/07/2023    [provider]  Menthol , Topical Analgesic, (BIOFREEZE EX) Apply 1 Application topically as needed.    [provider]    Physical Exam:  Vitals:   12/07/23 1530 12/07/23 1556 12/07/23 1820 12/07/23 1929  BP: (!) 117/93 (!) 124/93  (!) 136/91  Pulse: 80 84  97  Resp:    17  Temp:  97.8 F (36.6 C)  98.1 F (36.7 C)  TempSrc:  Oral  Oral  SpO2: 98% 98%  98%  Weight:   85.4 kg   Height:   5' 5 (1.651 m)     *** Constitutional: Awake alert and oriented x3, no associated distress.   Skin: no rashes, no lesions, good skin turgor noted. Eyes: Pupils are equally reactive to light.  No evidence of scleral icterus or conjunctival pallor.  ENMT: Moist mucous membranes noted.  Posterior pharynx clear of any exudate or lesions.   Neck: normal, supple, no masses, no thyromegaly.  No evidence of jugular venous distension.   Respiratory: clear to auscultation bilaterally, no wheezing, no crackles. Normal respiratory effort. No accessory muscle use.  Cardiovascular: Regular rate and rhythm, no murmurs / rubs / gallops. No extremity edema. 2+ pedal pulses. No carotid bruits.  Chest:   Nontender without crepitus or deformity.   Back:   Nontender without crepitus or deformity. Abdomen: Abdomen is soft and nontender.  No evidence of intra-abdominal masses.  Positive bowel sounds noted in all quadrants.   Musculoskeletal: No joint deformity upper and lower extremities. Good ROM, no contractures. Normal muscle tone.  Neurologic: CN 2-12 grossly intact. Sensation intact.  Patient moving all 4 extremities spontaneously.  Patient is following all commands.  Patient is responsive to verbal stimuli.   Psychiatric: Patient exhibits normal mood with appropriate affect.  Patient seems to possess insight as to their current situation.    Data Reviewed:  I have personally reviewed and interpreted labs, imaging.  Significant findings are ***  Lab Results  Component Value Date   WBC 10.5 12/06/2023   HGB 14.1 12/06/2023   HCT 41.6 12/06/2023   MCV 86.3 12/06/2023   PLT 425 (H) 12/06/2023   Lab Results   Component Value Date   K 3.9 12/06/2023   Lab Results  Component Value Date   BUN 8 12/06/2023   Lab Results  Component Value Date   CREATININE 0.76 12/06/2023    CXR:  *** Chest X-ray was personally reviewed.  No evidence of focal infiltrates.  No evidence of pleural effusion.  No evidence of pneumothorax.    EKG: Personally reviewed.  Rhythm is *** with heart rate of ***.  No dynamic ST segment changes appreciated.   *** Assessment & Plan Acute upper back pain     Code Status:  {Palliative Code status:23503}  code status decision has been confirmed with: *** Family Communication: ***   Consults: ***  Severity  of Illness:  {Observation/Inpatient:21159}  Author:  Zachary JINNY Ba MD  12/07/2023 11:16 PM

## 2023-12-07 NOTE — Progress Notes (Addendum)
 PT Cancellation Note  Patient Details Name: Shirley Hansen MRN: [id number] DOB: 08-12-1965   Cancelled Treatment:    Reason Eval/Treat Not Completed: Other (comment)  Pt declines therapy today.  States that she just got to room from ED and still in too much pain.  Request therapy to return tomorrow. Will need hemiwalker for mobility. [name], PT Acute Rehab [facility] [phone]  [name] 12/07/2023, 5:10 PM

## 2023-12-07 NOTE — ED Notes (Signed)
 Pt in bed in nad at this time, states pain is better improved a bit, given two more glasses of water and lights out. Call bell in reach

## 2023-12-07 NOTE — Progress Notes (Signed)
  Carryover admission to the Day Admitter.  I discussed this case with the EDP, Dr. Carita.  Per these discussions:   This is a 58 year old female who is being admitted for pain control relating to acute upper back discomfort after presenting with new upper back discomfort over the last few days.  She experienced a fall on 11/22/2023 resulting in an open left humerus fracture, status post repair.  Over the last few days, she has noted new onset left upper back pain, more so over the left trapezius area, along with some left arm discomfort.  CTA chest reportedly showed no evidence of acute process, including no evidence of acute PE or dissection.  Her initial heart rate was mildly elevated, but improving with Nerval IV pain medication, including 2 doses of 1 mg of IV Dilaudid .  Additionally, EDP ordered Lidoderm  patch for the upper back.  I have placed an order for observation to med/tele for further evaluation management of the above.  I have placed some additional preliminary admit orders via the adult multi-morbid admission order set. I have also ordered prn IV Dilaudid , incentive spirometry.    Eva Pore, DO Hospitalist

## 2023-12-07 NOTE — ED Notes (Addendum)
 Placed non-slip socks on and gave pt three glasses of water per request. Placed on purwick per request

## 2023-12-07 NOTE — ED Notes (Signed)
 Appears asleep at this time, rise and fall of chest noted

## 2023-12-07 NOTE — ED Notes (Signed)
 Patient is resting comfortably.

## 2023-12-07 NOTE — ED Provider Notes (Signed)
 Care assumed from Dr. Franklyn.  Patient with left arm pain after humerus surgery June 13.  Comes in with worsening arm pain, left upper back pain, left neck pain, posterior headache.  Pending CT scans of head and neck as well as ruling put PE given persistent tachycardia.  No PE. Possible pneumonitis.  CTA negative without large vessel occlusion or ICH.   Persistent ongoing pain with persistent tachycardia. Some dizziness with possible worsening of chronic L side weakness.   Plan admission for pain control and further CVA ruleout. D/w Dr. Marcene.    Carita Senior, MD 12/07/23 [phone]

## 2023-12-08 ENCOUNTER — Encounter (HOSPITAL_COMMUNITY): Payer: Self-pay | Admitting: Internal Medicine

## 2023-12-08 DIAGNOSIS — M549 Dorsalgia, unspecified: Secondary | ICD-10-CM | POA: Diagnosis not present

## 2023-12-08 DIAGNOSIS — M5459 Other low back pain: Secondary | ICD-10-CM | POA: Diagnosis not present

## 2023-12-08 DIAGNOSIS — F17210 Nicotine dependence, cigarettes, uncomplicated: Secondary | ICD-10-CM | POA: Diagnosis present

## 2023-12-08 DIAGNOSIS — E782 Mixed hyperlipidemia: Secondary | ICD-10-CM | POA: Diagnosis present

## 2023-12-08 DIAGNOSIS — E041 Nontoxic single thyroid nodule: Secondary | ICD-10-CM

## 2023-12-08 DIAGNOSIS — S42412D Displaced simple supracondylar fracture without intercondylar fracture of left humerus, subsequent encounter for fracture with routine healing: Secondary | ICD-10-CM

## 2023-12-08 DIAGNOSIS — I10 Essential (primary) hypertension: Secondary | ICD-10-CM | POA: Diagnosis not present

## 2023-12-08 DIAGNOSIS — M25562 Pain in left knee: Secondary | ICD-10-CM | POA: Diagnosis not present

## 2023-12-08 LAB — CBC WITH DIFFERENTIAL/PLATELET
Abs Immature Granulocytes: 0.02 10*3/uL (ref 0.00–0.07)
Basophils Absolute: 0.1 10*3/uL (ref 0.0–0.1)
Basophils Relative: 1 %
Eosinophils Absolute: 0.1 10*3/uL (ref 0.0–0.5)
Eosinophils Relative: 2 %
HCT: 42 % (ref 36.0–46.0)
Hemoglobin: 13.5 g/dL (ref 12.0–15.0)
Immature Granulocytes: 0 %
Lymphocytes Relative: 28 %
Lymphs Abs: 2.2 10*3/uL (ref 0.7–4.0)
MCH: 29.3 pg (ref 26.0–34.0)
MCHC: 32.1 g/dL (ref 30.0–36.0)
MCV: 91.1 fL (ref 80.0–100.0)
Monocytes Absolute: 0.8 10*3/uL (ref 0.1–1.0)
Monocytes Relative: 10 %
Neutro Abs: 4.8 10*3/uL (ref 1.7–7.7)
Neutrophils Relative %: 59 %
Platelets: 412 10*3/uL — ABNORMAL HIGH (ref 150–400)
RBC: 4.61 MIL/uL (ref 3.87–5.11)
RDW: 13.8 % (ref 11.5–15.5)
WBC: 8 10*3/uL (ref 4.0–10.5)
nRBC: 0 % (ref 0.0–0.2)

## 2023-12-08 LAB — COMPREHENSIVE METABOLIC PANEL WITH GFR
ALT: 19 U/L (ref 0–44)
AST: 19 U/L (ref 15–41)
Albumin: 3.9 g/dL (ref 3.5–5.0)
Alkaline Phosphatase: 94 U/L (ref 38–126)
Anion gap: 13 (ref 5–15)
BUN: 16 mg/dL (ref 6–20)
CO2: 25 mmol/L (ref 22–32)
Calcium: 9.3 mg/dL (ref 8.9–10.3)
Chloride: 100 mmol/L (ref 98–111)
Creatinine, Ser: 0.83 mg/dL (ref 0.44–1.00)
GFR, Estimated: >60 mL/min (ref >60)
Glucose, Bld: 139 mg/dL — ABNORMAL HIGH (ref 70–99)
Potassium: 3.1 mmol/L — ABNORMAL LOW (ref 3.5–5.1)
Sodium: 138 mmol/L (ref 135–145)
Total Bilirubin: 0.7 mg/dL (ref 0.0–1.2)
Total Protein: 7.8 g/dL (ref 6.5–8.1)

## 2023-12-08 LAB — MAGNESIUM: Magnesium: 2.2 mg/dL (ref 1.7–2.4)

## 2023-12-08 MED ORDER — PREGABALIN 100 MG PO CAPS
200.0000 mg | ORAL_CAPSULE | Freq: Every day | ORAL | Status: DC
  Administered 2023-12-08: 200 mg via ORAL
  Filled 2023-12-08: qty 2

## 2023-12-08 MED ORDER — OXYCODONE HCL 5 MG PO TABS
10.0000 mg | ORAL_TABLET | ORAL | Status: DC | PRN
  Administered 2023-12-08: 10 mg via ORAL
  Filled 2023-12-08: qty 2

## 2023-12-08 MED ORDER — VITAMIN B-12 1000 MCG PO TABS
2000.0000 ug | ORAL_TABLET | Freq: Every morning | ORAL | Status: DC
  Administered 2023-12-08 – 2023-12-09 (×2): 2000 ug via ORAL
  Filled 2023-12-08 (×2): qty 2

## 2023-12-08 MED ORDER — NAPROXEN SODIUM 550 MG PO TABS: 550.0000 mg | ORAL_TABLET | Freq: Two times a day (BID) | ORAL | Status: DC

## 2023-12-08 MED ORDER — BACLOFEN 10 MG PO TABS
5.0000 mg | ORAL_TABLET | Freq: Every day | ORAL | Status: DC
  Administered 2023-12-08: 5 mg via ORAL
  Filled 2023-12-08: qty 1

## 2023-12-08 MED ORDER — ALBUTEROL SULFATE (2.5 MG/3ML) 0.083% IN NEBU: 3.0000 mL | INHALATION_SOLUTION | RESPIRATORY_TRACT | Status: DC | PRN

## 2023-12-08 MED ORDER — CARVEDILOL 25 MG PO TABS
25.0000 mg | ORAL_TABLET | Freq: Two times a day (BID) | ORAL | Status: DC
  Administered 2023-12-08 – 2023-12-09 (×3): 25 mg via ORAL
  Filled 2023-12-08 (×3): qty 1

## 2023-12-08 MED ORDER — DORZOLAMIDE HCL 2 % OP SOLN
1.0000 [drp] | Freq: Two times a day (BID) | OPHTHALMIC | Status: DC
  Administered 2023-12-08 – 2023-12-09 (×3): 1 [drp] via OPHTHALMIC
  Filled 2023-12-08: qty 10

## 2023-12-08 MED ORDER — FAMOTIDINE 20 MG PO TABS
20.0000 mg | ORAL_TABLET | Freq: Every day | ORAL | Status: DC
  Administered 2023-12-08: 20 mg via ORAL
  Filled 2023-12-08: qty 1

## 2023-12-08 MED ORDER — OXYCODONE HCL 5 MG PO TABS: 5.0000 mg | ORAL_TABLET | ORAL | Status: DC | PRN

## 2023-12-08 MED ORDER — NAPROXEN 500 MG PO TABS
500.0000 mg | ORAL_TABLET | Freq: Two times a day (BID) | ORAL | Status: DC
  Administered 2023-12-08 – 2023-12-09 (×2): 500 mg via ORAL
  Filled 2023-12-08 (×2): qty 1

## 2023-12-08 MED ORDER — DULOXETINE HCL 30 MG PO CPEP
30.0000 mg | ORAL_CAPSULE | Freq: Every day | ORAL | Status: DC
  Administered 2023-12-08: 30 mg via ORAL
  Filled 2023-12-08: qty 1

## 2023-12-08 MED ORDER — ASPIRIN 81 MG PO TBEC
81.0000 mg | DELAYED_RELEASE_TABLET | Freq: Every morning | ORAL | Status: DC
  Administered 2023-12-08 – 2023-12-09 (×2): 81 mg via ORAL
  Filled 2023-12-08 (×2): qty 1

## 2023-12-08 MED ORDER — ATORVASTATIN CALCIUM 40 MG PO TABS
80.0000 mg | ORAL_TABLET | Freq: Every day | ORAL | Status: DC
  Administered 2023-12-08: 80 mg via ORAL
  Filled 2023-12-08: qty 2

## 2023-12-08 MED ORDER — VITAMIN D3 25 MCG (1000 UNIT) PO TABS
2000.0000 [IU] | ORAL_TABLET | Freq: Every morning | ORAL | Status: DC
  Administered 2023-12-08 – 2023-12-09 (×2): 2000 [IU] via ORAL
  Filled 2023-12-08 (×2): qty 2

## 2023-12-08 MED ORDER — AMLODIPINE BESYLATE 10 MG PO TABS
10.0000 mg | ORAL_TABLET | Freq: Every morning | ORAL | Status: DC
  Administered 2023-12-08 – 2023-12-09 (×2): 10 mg via ORAL
  Filled 2023-12-08 (×2): qty 1

## 2023-12-08 MED ORDER — METHOCARBAMOL 500 MG PO TABS: 1000.0000 mg | ORAL_TABLET | Freq: Three times a day (TID) | ORAL | Status: DC

## 2023-12-08 MED ORDER — TIMOLOL MALEATE 0.5 % OP SOLN
1.0000 [drp] | Freq: Two times a day (BID) | OPHTHALMIC | Status: DC
  Administered 2023-12-08 – 2023-12-09 (×3): 1 [drp] via OPHTHALMIC
  Filled 2023-12-08: qty 5

## 2023-12-08 MED ORDER — DORZOLAMIDE HCL-TIMOLOL MAL 2-0.5 % OP SOLN: 1.0000 [drp] | Freq: Two times a day (BID) | OPHTHALMIC | Status: DC

## 2023-12-08 MED ORDER — METHOCARBAMOL 500 MG PO TABS: 1000.0000 mg | ORAL_TABLET | Freq: Three times a day (TID) | ORAL | Status: DC | PRN

## 2023-12-08 MED ORDER — PREGABALIN 75 MG PO CAPS
150.0000 mg | ORAL_CAPSULE | Freq: Every morning | ORAL | Status: DC
  Administered 2023-12-08 – 2023-12-09 (×2): 150 mg via ORAL
  Filled 2023-12-08 (×2): qty 2

## 2023-12-08 NOTE — Assessment & Plan Note (Signed)
?   Counseling on cessation daily ?

## 2023-12-08 NOTE — Assessment & Plan Note (Signed)
.   Resume patients home regimen of oral antihypertensives . Titrate antihypertensive regimen as necessary to achieve adequate BP control . PRN intravenous antihypertensives for excessively elevated blood pressure   

## 2023-12-08 NOTE — Progress Notes (Addendum)
 PROGRESS NOTE   Shirley Hansen  FMW:[id number] DOB: April 01, 1966 DOA: 12/06/2023 PCP: Campbell Reynolds, NP   Date of Service: the patient was seen and examined on 12/08/2023  Brief Narrative:  58 y.o.female with a history of PMH of CVA with residual mild left-sided weakness (2021), nicotine dependence, coronary artery disease status post NSTEMI in 2017, hypertension, hyperlipidemia presenting to [facility] with complaints of severe left arm pain and upper back pain.  Upon evaluation in the emergency department CT angiogram of the chest was performed as well as dedicated CT imaging of the left shoulder head and neck all of which were unremarkable.  Patient received several doses of intravenous opiate-based analgesics due to her pain with no improvement in symptoms.  Due to patient's continued intractable pain the hospitalist group was then called to assess the patient for admission to hospital.   Assessment & Plan Intractable back pain Slow improvement with combination of opiates, Lidoderm  patch and muscle relaxants  Radiographic imaging does not reveal any acute fracture of the thoracic cervical or lumbar spine  Pain is likely all radiating from significant pain related to patient's distal humerus fracture.   PT evaluation, recommending outpatient PT Continuing to titrate opiate-based analgesics, adding scheduled NSAIDs.  Anticipate discharge in the next 24 to 48 hours. Open supracondylar fracture of left humerus No obvious deformities of CT imaging of the left shoulder Left hand is seemingly neurovascularly intact on exam Adjusting analgesics as noted above, will need to continue to follow-up as an outpatient with orthopedic surgery. Left knee pain Notable left knee pain with both passive and active range of motion No obvious deformity on exam X-rays obtained during this hospitalization are unremarkable Nicotine dependence, cigarettes, uncomplicated Counseling on cessation  daily Essential hypertension Blood pressures well-controlled Titrate antihypertensive regimen as necessary to achieve adequate BP control PRN intravenous antihypertensives for excessively elevated blood pressure  Mixed hyperlipidemia Continuing home regimen of lipid lowering therapy.  Right thyroid  nodule Incidental finding of right thyroid  nodule measuring 2.1 cm. Outpatient ultrasound   Subjective:  Patient continuing to complain of severe left shoulder and mid back pain, worse with movement.  Pain is somewhat improved with as needed opiate-based analgesics.  Physical Exam:  Vitals:   12/08/23 0429 12/08/23 0911 12/08/23 1248 12/08/23 1958  BP:  125/86 125/88 (!) 136/93  Pulse:   77 99  Resp:   20 18  Temp:   97.9 F (36.6 C) 98 F (36.7 C)  TempSrc:   Oral Oral  SpO2:    97%  Weight: 88.6 kg     Height:        Constitutional: Awake alert and oriented x3, no associated distress.   Skin: no rashes, no lesions, good skin turgor noted. Eyes: Pupils are equally reactive to light.  No evidence of scleral icterus or conjunctival pallor.  ENMT: Moist mucous membranes noted.  Posterior pharynx clear of any exudate or lesions.   Respiratory: clear to auscultation bilaterally, no wheezing, no crackles. Normal respiratory effort. No accessory muscle use.  Cardiovascular: Regular rate and rhythm, no murmurs / rubs / gallops. No extremity edema. 2+ pedal pulses. No carotid bruits.  Abdomen: Abdomen is soft and nontender.  No evidence of intra-abdominal masses.  Positive bowel sounds noted in all quadrants.   Musculoskeletal: Tenderness of the thoracic and lumbar spine without crepitus or deformity.  Exquisite tenderness of the left upper extremity with palpation.  Mild pain of the left knee with passive and active range of motion.  Left upper extremity is currently in a cast/sling.    Data Reviewed:  I have personally reviewed and interpreted labs, imaging.  Significant findings  are   CBC: Recent Labs  Lab 12/06/23 2056 12/08/23 0738  WBC 10.5 8.0  NEUTROABS 7.7 4.8  HGB 14.1 13.5  HCT 41.6 42.0  MCV 86.3 91.1  PLT 425* 412*   Basic Metabolic Panel: Recent Labs  Lab 12/06/23 2056 12/08/23 0738  NA 141 138  K 3.9 3.1*  CL 103 100  CO2 21* 25  GLUCOSE 87 139*  BUN 8 16  CREATININE 0.76 0.83  CALCIUM  9.4 9.3  MG  --  2.2   GFR: Estimated Creatinine Clearance: 81.2 mL/min (by C-G formula based on SCr of 0.83 mg/dL). Liver Function Tests: Recent Labs  Lab 12/06/23 2056 12/08/23 0738  AST 25 19  ALT 21 19  ALKPHOS 93 94  BILITOT 1.4* 0.7  PROT 8.5* 7.8  ALBUMIN  4.1 3.9    Code Status:  Full code.     Severity of Illness:  The appropriate patient status for this patient is OBSERVATION. Observation status is judged to be reasonable and necessary in order to provide the required intensity of service to ensure the patient's safety. The patient's presenting symptoms, physical exam findings, and initial radiographic and laboratory data in the context of their medical condition is felt to place them at decreased risk for further clinical deterioration. Furthermore, it is anticipated that the patient will be medically stable for discharge from the hospital within 2 midnights of admission.   Time spent:  40 minutes  Author:  Zachary JINNY Ba MD  12/08/2023 11:58 PM

## 2023-12-08 NOTE — Assessment & Plan Note (Signed)
.   Continuing home regimen of lipid lowering therapy.  

## 2023-12-08 NOTE — Assessment & Plan Note (Signed)
 Incidental finding of right thyroid  nodule measuring 2.1 cm. Outpatient ultrasound

## 2023-12-08 NOTE — TOC Initial Note (Signed)
 Transition of Care [facility]) - Initial/Assessment Note    Patient Details  Name: Shirley Hansen MRN: [id number] Date of Birth: 1965/11/10  Transition of Care [facility]) CM/SW Contact:    Sonda Manuella Quill, RN Phone Number: 12/08/2023, 10:35 AM  Clinical Narrative:                 Spoke w/ pt in room; pt says she currently lives at home w/ her niece; she says she will d/c home to [facility] w/ her dtr at d/c; pt identified POC dtr Elodia Birmingham [phone]); her family will provide transportation; pt verified insurance/PCP; she denied SDOH risks; pt says she has walker, and wheelchair; she does not have HH services, or home oxygen; pt has OPPT/OT w/ [facility]; awaiting PT eval; TOC will follow.  Expected Discharge Plan: Home/Self Care Barriers to Discharge: Continued Medical Work up   Patient Goals and CMS Choice Patient states their goals for this hospitalization and ongoing recovery are:: home CMS Medicare.gov Compare Post Acute Care list provided to:: Patient   [place] ownership interest in [facility].provided to:: Patient    Expected Discharge Plan and Services   Discharge Planning Services: CM Consult   Living arrangements for the past 2 months: Single Family Home                                      Prior Living Arrangements/Services Living arrangements for the past 2 months: Single Family Home Lives with:: Relatives Patient language and need for interpreter reviewed:: Yes Do you feel safe going back to the place where you live?: Yes      Need for Family Participation in Patient Care: Yes (Comment) Care giver support system in place?: Yes (comment) Current home services: DME (walker, wheelchair) Criminal Activity/Legal Involvement Pertinent to Current Situation/Hospitalization: No - Comment as needed  Activities of Daily Living   ADL Screening (condition at time of admission) Independently performs ADLs?: Yes (appropriate for developmental  age) Is the patient deaf or have difficulty hearing?: No Does the patient have difficulty seeing, even when wearing glasses/contacts?: No Does the patient have difficulty concentrating, remembering, or making decisions?: No  Permission Sought/Granted Permission sought to share information with : Case Manager Permission granted to share information with : Yes, Verbal Permission Granted  Share Information with NAME: Case Manager     Permission granted to share info w Relationship: Elodia Birmingham (dtr) [phone]     Emotional Assessment Appearance:: Appears stated age Attitude/Demeanor/Rapport: Gracious Affect (typically observed): Accepting Orientation: : Oriented to Self, Oriented to Place, Oriented to  Time, Oriented to Situation Alcohol  / Substance Use: Not Applicable Psych Involvement: No (comment)  Admission diagnosis:  Tachycardia [R00.0] Postoperative pain [G89.18] Left arm pain [M79.602] Acute upper back pain [M54.9] Patient Active Problem List   Diagnosis Date Noted   Nicotine dependence, cigarettes, uncomplicated 12/08/2023   Mixed hyperlipidemia 12/08/2023   Left knee pain 12/08/2023   Right thyroid  nodule 12/08/2023   Intractable back pain 12/07/2023   Early menopause occurring in patient age younger than 30 years 11/23/2023   Open supracondylar fracture of left humerus 11/23/2023   Supracondylar fracture of humerus, left, open, initial encounter 11/22/2023   Acute encephalopathy 04/09/2021   Hypertensive urgency 04/09/2021   Hemiparesis affecting left side as late effect of cerebrovascular accident (CVA) (HCC) 04/07/2021   Muscle spasticity 04/07/2021   History of CVA (cerebrovascular accident) 03/23/2021  Chronic left shoulder pain 03/23/2021   Depression 03/23/2021   Glaucoma of both eyes 03/23/2021   Dizziness 03/13/2021   Orthostatic hypotension 03/13/2021   Weakness 03/05/2021   COVID-19 07/14/2020   Stroke (HCC)    Stage 3a chronic kidney disease  (HCC)    Essential hypertension    PCP:  Campbell Reynolds, NP Pharmacy:   [facility] [phone] [address] [place] - [address] [place] [address] Phone: [phone] Fax: [phone]  [facility] - [facility] [address], [address] [place] [address] Phone: [phone] Fax: [phone]  [facility] - [place], [place] - [address] [place] [address] Phone: [phone] Fax: [phone]  [facility] - [facility] [address] [place] [address] Phone: [phone] Fax: [phone]     Social Drivers of Health (SDOH) Social History: SDOH Screenings   Food Insecurity: No Food Insecurity (12/08/2023)  Housing: Low Risk  (12/08/2023)  Transportation Needs: No Transportation Needs (12/08/2023)  Utilities: Not At Risk (12/08/2023)  Depression (PHQ2-9): Low Risk  (04/20/2021)  Recent Concern: Depression (PHQ2-9) - Medium Risk (04/05/2021)  Financial Resource Strain: Low Risk  (08/26/2022)   [facility] from [facility]  Physical Activity: Inactive (10/06/2020)   Received from [facility]  Social Connections: Moderately Isolated (10/06/2020)   Received from [facility]  Stress: No Stress Concern Present (10/06/2020)   Received from [facility]  Tobacco Use: Medium Risk (12/08/2023)   SDOH Interventions: Food Insecurity Interventions: Intervention Not Indicated, Inpatient TOC Housing Interventions: Intervention Not Indicated, Inpatient TOC Transportation Interventions: Intervention Not Indicated, Inpatient TOC Utilities Interventions: Intervention Not Indicated, Inpatient TOC   Readmission Risk Interventions     No data to display

## 2023-12-08 NOTE — Assessment & Plan Note (Signed)
 Notable left knee pain with both passive and active range of motion No obvious deformity on exam Will obtain x-rays

## 2023-12-08 NOTE — Care Management Obs Status (Signed)
 MEDICARE OBSERVATION STATUS NOTIFICATION   Patient Details  Name: Shirley Hansen MRN: [id number] Date of Birth: [birth year]   Medicare Observation Status Notification Given:  Yes    Sonda Manuella Quill, RN 12/08/2023, 10:06 AM

## 2023-12-08 NOTE — Evaluation (Signed)
 Physical Therapy Evaluation Patient Details Name: Shirley Hansen MRN: [id number] DOB: 07/18/1965 Today's Date: 12/08/2023  History of Present Illness  Pt is a 58 y.o female admitted 12/06/23 for complaints of severe left arm pain and upper back pain.  Pt with recent admission on 6/12 for L arm pain after falling out of bed, X-rays showed L distal humerus fx, and pt S/p ORIF 6/13.  PMH: CVA with residual mild left-sided weakness (2021), nicotine dependence, coronary artery disease status post NSTEMI in 2017, hypertension, hyperlipidemia  Clinical Impression  Pt admitted with above diagnosis.  Pt currently with functional limitations due to the deficits listed below (see PT Problem List). Pt will benefit from acute skilled PT to increase their independence and safety with mobility to allow discharge.  Pt reports she was doing well after her recent surgery and able to ambulate with hemiwalker and participating in OPPT.  Pt started having extreme pain in left posterior shoulder and neck which was not managed with her current pain meds after surgery so she came to ED.  Pt is able to mobilize safely however requires significant amount of time due to increased pain.  Pain reported 8/10 at rest and increased beyond 10/10 with mobility.  Pt anticipates d/c to her daughter's home in [place] so she will have a little more assist available and less stairs to perform (daughter only has a couple to enter home).  Pt does report pain spanning trapezius muscle and reports spasms so notified RN (maybe adding muscle relaxer to pain meds will help?)         If plan is discharge home, recommend the following: A little help with walking and/or transfers;A little help with bathing/dressing/bathroom;Assistance with cooking/housework;Help with stairs or ramp for entrance   Can travel by private vehicle        Equipment Recommendations None recommended by PT  Recommendations for Other Services       Functional Status  Assessment Patient has had a recent decline in their functional status and demonstrates the ability to make significant improvements in function in a reasonable and predictable amount of time.     Precautions / Restrictions Precautions Precautions: Fall Recall of Precautions/Restrictions: Intact Required Braces or Orthoses: Sling;Splint/Cast Splint/Cast: L Long arm splint Restrictions Weight Bearing Restrictions Per Provider Order: Yes LUE Weight Bearing Per Provider Order: Non weight bearing      Mobility  Bed Mobility Overal bed mobility: Needs Assistance Bed Mobility: Supine to Sit     Supine to sit: Used rails, HOB elevated, Supervision          Transfers Overall transfer level: Needs assistance Equipment used: Hemi-walker Transfers: Sit to/from Stand Sit to Stand: Contact guard assist           General transfer comment: CGA to close supervision; cues for hand placement    Ambulation/Gait Ambulation/Gait assistance: Contact guard assist Gait Distance (Feet): 8 Feet Assistive device: Hemi-walker Gait Pattern/deviations: Steppage, Decreased stance time - left, Decreased step length - right Gait velocity: decr     General Gait Details: Using hemiwalker in R hand; with use of flexor synergy for LLE advancement, mostly limited distance and increased time to perform due to increased left posterior shoulder and neck pain  Stairs            Wheelchair Mobility     Tilt Bed    Modified Rankin (Stroke Patients Only)       Balance  Pertinent Vitals/Pain Pain Assessment Pain Assessment: 0-10 Pain Score: 10-Worst pain ever Pain Location: L upper back (describes entire trapezius area with spasms so RN notified) Pain Descriptors / Indicators: Spasm, Stabbing, Crying Pain Intervention(s): Repositioned, Monitored during session, Premedicated before session, Patient requesting pain meds-RN  notified    Home Living Family/patient expects to be discharged to:: Private residence   Available Help at Discharge: Family;Friend(s) Type of Home: House Home Access: Stairs to enter Entrance Stairs-Rails: Right Entrance Stairs-Number of Steps: 3-4   Home Layout: Able to live on main level with bedroom/bathroom Home Equipment: Wheelchair - Careers adviser (comment) Psychologist, educational)      Prior Function               Mobility Comments: mod I with hemiwalker after recent admission with left distal humerus surgergy, was working with OPPT; plans to d/c to [facility] - home with daughter (as above)       Extremity/Trunk Assessment   Upper Extremity Assessment LUE Deficits / Details: L distal humerus fx in long arm splint. maintained sling    Lower Extremity Assessment LLE Deficits / Details: L side effected by previous CVA; advances LLE with gross flexor synergy, uncoordinated steps; Still, adequate strength to support body weight without knee and/or hip flexion in single limb stance    Cervical / Trunk Assessment Cervical / Trunk Assessment: Other exceptions Cervical / Trunk Exceptions: reports increased left posterior shoulder and neck pain limiting neck motion at this time  Communication   Communication Communication: No apparent difficulties    Cognition Arousal: Alert Behavior During Therapy: WFL for tasks assessed/performed   PT - Cognitive impairments: No apparent impairments                         Following commands: Intact       Cueing Cueing Techniques: Verbal cues, Tactile cues     General Comments      Exercises     Assessment/Plan    PT Assessment Patient needs continued PT services  PT Problem List Decreased strength;Decreased range of motion;Decreased activity tolerance;Decreased mobility;Decreased knowledge of use of DME;Decreased safety awareness;Pain;Decreased balance       PT Treatment Interventions Gait training;DME  instruction;Balance training;Functional mobility training;Therapeutic activities;Therapeutic exercise;Patient/family education;Stair training    PT Goals (Current goals can be found in the Care Plan section)  Acute Rehab PT Goals PT Goal Formulation: With patient Time For Goal Achievement: 12/22/23 Potential to Achieve Goals: Good    Frequency Min 3X/week     Co-evaluation               AM-PAC PT 6 Clicks Mobility  Outcome Measure Help needed turning from your back to your side while in a flat bed without using bedrails?: A Little Help needed moving from lying on your back to sitting on the side of a flat bed without using bedrails?: A Little Help needed moving to and from a bed to a chair (including a wheelchair)?: A Little Help needed standing up from a chair using your arms (e.g., wheelchair or bedside chair)?: A Little Help needed to walk in hospital room?: A Lot Help needed climbing 3-5 steps with a railing? : A Lot 6 Click Score: 16    End of Session Equipment Utilized During Treatment: Gait belt Activity Tolerance: Patient limited by pain Patient left: in chair;with call bell/phone within reach;with chair alarm set Nurse Communication: Mobility status PT Visit Diagnosis: Difficulty in walking, not elsewhere classified (R26.2)  Time: 9071-8996 PT Time Calculation (min) (ACUTE ONLY): 35 min   Charges:   PT Evaluation $PT Eval Low Complexity: 1 Low PT Treatments $Gait Training: 8-22 mins PT General Charges $$ ACUTE PT VISIT: 1 Visit        Tari PT, DPT Physical Therapist Acute Rehabilitation Services Office: [phone]   Kati L Payson 12/08/2023, 1:10 PM

## 2023-12-08 NOTE — Assessment & Plan Note (Signed)
 Patient is complaining of severe thoracic back pain on presentation but on exam lumbar spine is seemingly what is tender. More than likely patient's thoracic pain is simply radiation of pain or referred pain from the patient's known humerus fracture CT angiogram of the chest reveals no obvious rib fracture or fractures of the thoracic spine EDP has initiated Lidoderm  patches.  Will additionally place patient on a regimen of as needed continued opiate-based analgesics.  Vitamin D  level checked during last hospitalization is unremarkable. Will additionally obtain PT evaluation Obtaining x-rays of the lumbar spine

## 2023-12-08 NOTE — Assessment & Plan Note (Signed)
 No obvious deformities of CT imaging of the left shoulder Left hand is seemingly neurovascularly intact on exam

## 2023-12-08 NOTE — Hospital Course (Addendum)
 58 y.o.female with a history of PMH of CVA with residual mild left-sided weakness (2021), nicotine dependence, coronary artery disease status post NSTEMI in 2017, hypertension, hyperlipidemia presenting to [facility] with complaints of severe left arm pain and upper back pain.  Upon evaluation in the emergency department CT angiogram of the chest was performed as well as dedicated CT imaging of the left shoulder head and neck all of which were unremarkable.  Patient received several doses of intravenous opiate-based analgesics due to her pain with no improvement in symptoms.  Due to patient's continued intractable pain the hospitalist group was then called to assess the patient for admission to hospital.

## 2023-12-09 DIAGNOSIS — I10 Essential (primary) hypertension: Secondary | ICD-10-CM | POA: Diagnosis not present

## 2023-12-09 DIAGNOSIS — M25562 Pain in left knee: Secondary | ICD-10-CM | POA: Diagnosis not present

## 2023-12-09 DIAGNOSIS — M5459 Other low back pain: Secondary | ICD-10-CM | POA: Diagnosis not present

## 2023-12-09 DIAGNOSIS — M549 Dorsalgia, unspecified: Secondary | ICD-10-CM | POA: Diagnosis not present

## 2023-12-09 DIAGNOSIS — E782 Mixed hyperlipidemia: Secondary | ICD-10-CM | POA: Diagnosis not present

## 2023-12-09 LAB — COMPREHENSIVE METABOLIC PANEL WITH GFR
ALT: 19 U/L (ref 0–44)
AST: 20 U/L (ref 15–41)
Albumin: 3.8 g/dL (ref 3.5–5.0)
Alkaline Phosphatase: 94 U/L (ref 38–126)
Anion gap: 12 (ref 5–15)
BUN: 14 mg/dL (ref 6–20)
CO2: 23 mmol/L (ref 22–32)
Calcium: 8.9 mg/dL (ref 8.9–10.3)
Chloride: 102 mmol/L (ref 98–111)
Creatinine, Ser: 0.89 mg/dL (ref 0.44–1.00)
GFR, Estimated: >60 mL/min (ref >60)
Glucose, Bld: 117 mg/dL — ABNORMAL HIGH (ref 70–99)
Potassium: 3.4 mmol/L — ABNORMAL LOW (ref 3.5–5.1)
Sodium: 137 mmol/L (ref 135–145)
Total Bilirubin: 0.6 mg/dL (ref 0.0–1.2)
Total Protein: 7.3 g/dL (ref 6.5–8.1)

## 2023-12-09 LAB — CBC WITH DIFFERENTIAL/PLATELET
Abs Immature Granulocytes: 0.04 10*3/uL (ref 0.00–0.07)
Basophils Absolute: 0.1 10*3/uL (ref 0.0–0.1)
Basophils Relative: 1 %
Eosinophils Absolute: 0.2 10*3/uL (ref 0.0–0.5)
Eosinophils Relative: 2 %
HCT: 40.6 % (ref 36.0–46.0)
Hemoglobin: 13 g/dL (ref 12.0–15.0)
Immature Granulocytes: 1 %
Lymphocytes Relative: 32 %
Lymphs Abs: 2.5 10*3/uL (ref 0.7–4.0)
MCH: 28.8 pg (ref 26.0–34.0)
MCHC: 32 g/dL (ref 30.0–36.0)
MCV: 89.8 fL (ref 80.0–100.0)
Monocytes Absolute: 0.8 10*3/uL (ref 0.1–1.0)
Monocytes Relative: 10 %
Neutro Abs: 4.2 10*3/uL (ref 1.7–7.7)
Neutrophils Relative %: 54 %
Platelets: 378 10*3/uL (ref 150–400)
RBC: 4.52 MIL/uL (ref 3.87–5.11)
RDW: 13.9 % (ref 11.5–15.5)
WBC: 7.6 10*3/uL (ref 4.0–10.5)
nRBC: 0 % (ref 0.0–0.2)

## 2023-12-09 LAB — MAGNESIUM: Magnesium: 2.1 mg/dL (ref 1.7–2.4)

## 2023-12-09 MED ORDER — LIDOCAINE 5 % EX PTCH: 1.0000 | MEDICATED_PATCH | CUTANEOUS | 2 refills | Status: AC

## 2023-12-09 MED ORDER — ALUM & MAG HYDROXIDE-SIMETH 200-200-20 MG/5ML PO SUSP
30.0000 mL | ORAL | Status: DC | PRN
  Administered 2023-12-09: 30 mL via ORAL
  Filled 2023-12-09: qty 30

## 2023-12-09 MED ORDER — POTASSIUM CHLORIDE CRYS ER 20 MEQ PO TBCR
40.0000 meq | EXTENDED_RELEASE_TABLET | Freq: Once | ORAL | Status: AC
  Administered 2023-12-09: 40 meq via ORAL
  Filled 2023-12-09: qty 2

## 2023-12-09 MED ORDER — METHOCARBAMOL 1000 MG PO TABS
1000.0000 mg | ORAL_TABLET | Freq: Three times a day (TID) | ORAL | 0 refills | Status: AC | PRN
Start: 2023-12-09 — End: ?

## 2023-12-09 MED ORDER — NAPROXEN 500 MG PO TABS: 500.0000 mg | ORAL_TABLET | Freq: Two times a day (BID) | ORAL | 2 refills | Status: AC | PRN

## 2023-12-09 MED ORDER — OXYCODONE HCL 5 MG PO TABS: 5.0000 mg | ORAL_TABLET | ORAL | 0 refills | Status: AC | PRN

## 2023-12-09 NOTE — Assessment & Plan Note (Signed)
 Slow improvement with combination of opiates, Lidoderm  patch and muscle relaxants  Radiographic imaging does not reveal any acute fracture of the thoracic cervical or lumbar spine  Pain is likely all radiating from significant pain related to patient's distal humerus fracture.   PT evaluation, recommending outpatient PT Continuing to titrate opiate-based analgesics, adding scheduled NSAIDs.  Anticipate discharge in the next 24 to 48 hours.

## 2023-12-09 NOTE — Assessment & Plan Note (Signed)
 Notable left knee pain with both passive and active range of motion No obvious deformity on exam X-rays obtained during this hospitalization are unremarkable

## 2023-12-09 NOTE — Assessment & Plan Note (Signed)
 Incidental finding of right thyroid  nodule measuring 2.1 cm. Outpatient ultrasound

## 2023-12-09 NOTE — Assessment & Plan Note (Signed)
?   Counseling on cessation daily ?

## 2023-12-09 NOTE — Progress Notes (Signed)
 Orthopedic Tech Progress Note Patient Details:  Shirley Hansen 1966-04-20 [id number]  Patient ID: Shirley Hansen, female   DOB: 1965-10-09, 58 y.o.   MRN: [id number] Applied new shoulder immobilizer the LUE. No complaints from Pt.Shirley Hansen 12/09/2023, 10:18 AM

## 2023-12-09 NOTE — Progress Notes (Addendum)
 Discharge instructions were reviewed. She denied questions, or concerns at this time. Sling to left arm in place. IV removed site clean dry and intact. Pt complains of tenderness to IV site,.RFA IV site is edematous and firm.no erythema.

## 2023-12-09 NOTE — TOC Transition Note (Signed)
 Transition of Care [facility]) - Discharge Note   Patient Details  Name: Shirley Hansen MRN: [id number] Date of Birth: [birth year]  Transition of Care [facility]) CM/SW Contact:  Sonda Manuella Quill, RN Phone Number: 12/09/2023, 10:35 AM   Clinical Narrative:    D/C orders received; PT recc OPPT; pt is currently receiving service at [facility]; no TOC needs.   Final next level of care: Home/Self Care Barriers to Discharge: No Barriers Identified   Patient Goals and CMS Choice Patient states their goals for this hospitalization and ongoing recovery are:: home CMS Medicare.gov Compare Post Acute Care list provided to:: Patient   [place] ownership interest in [facility].provided to:: Patient    Discharge Placement                       Discharge Plan and Services Additional resources added to the After Visit Summary for     Discharge Planning Services: CM Consult                                 Social Drivers of Health (SDOH) Interventions SDOH Screenings   Food Insecurity: No Food Insecurity (12/08/2023)  Housing: Low Risk  (12/08/2023)  Transportation Needs: No Transportation Needs (12/08/2023)  Utilities: Not At Risk (12/08/2023)  Depression (PHQ2-9): Low Risk  (04/20/2021)  Recent Concern: Depression (PHQ2-9) - Medium Risk (04/05/2021)  Financial Resource Strain: Low Risk  (08/26/2022)   Received from [facility]  Physical Activity: Inactive (10/06/2020)   Received from [facility]  Social Connections: Moderately Isolated (10/06/2020)   Received from [facility]  Stress: No Stress Concern Present (10/06/2020)   Received from [facility]  Tobacco Use: Medium Risk (12/08/2023)     Readmission Risk Interventions     No data to display

## 2023-12-09 NOTE — Assessment & Plan Note (Signed)
.   Continuing home regimen of lipid lowering therapy.  

## 2023-12-09 NOTE — Assessment & Plan Note (Signed)
 Blood pressures well-controlled Titrate antihypertensive regimen as necessary to achieve adequate BP control PRN intravenous antihypertensives for excessively elevated blood pressure

## 2023-12-09 NOTE — Discharge Instructions (Signed)
 Please take all prescribed medications exactly as instructed including: Using Lidoderm  patches once daily to painful areas of your left shoulder and back. Using Naproxen , an antiinflammatory, twice daily with food Using Robaxin , a muscle relaxant, as needed every 8 hours.  Using Oxycodone  sparingly for severe pain Please consume a low sodium diet Please increase your physical activity as tolerated using your assistive device. Please maintain all outpatient follow-up appointments including follow-up with your primary care provider and orthopedic surgeon.  Referral has also been placed for you to go to outpatient physical therapy.  Please reach out to them and schedule a appointment as early as possible using the contact information attached. Please return to the emergency department if you develop confusion, shortness of breath, worsening pain, fevers in excess of 100.4 F, weakness or inability to tolerate oral intake.

## 2023-12-09 NOTE — Assessment & Plan Note (Signed)
 No obvious deformities of CT imaging of the left shoulder Left hand is seemingly neurovascularly intact on exam Adjusting analgesics as noted above, will need to continue to follow-up as an outpatient with orthopedic surgery.

## 2023-12-10 NOTE — Discharge Summary (Addendum)
 Physician Discharge Summary   Patient: Shirley Hansen MRN: [id number] DOB: 30-Aug-1965  Admit date:     12/06/2023  Discharge date: 12/09/2023  Discharge Physician: Zachary JINNY Ba   PCP: Campbell Reynolds, NP   Recommendations at discharge:    Please take all prescribed medications exactly as instructed including: Using Lidoderm  patches once daily to painful areas of your left shoulder and back. Using Naproxen , an antiinflammatory, twice daily with food Using Robaxin , a muscle relaxant, as needed every 8 hours.  Using Oxycodone  sparingly for severe pain Please consume a low sodium diet Please increase your physical activity as tolerated using your assistive device. Please maintain all outpatient follow-up appointments including follow-up with your primary care provider and orthopedic surgeon.  Referral has also been placed for you to go to outpatient physical therapy.  Please reach out to them and schedule a appointment as early as possible using the contact information attached. Please return to the emergency department if you develop confusion, shortness of breath, worsening pain, fevers in excess of 100.4 F, weakness or inability to tolerate oral intake. You dentally found to have a thyroid  nodule during your workup.  Please discuss further workup of this thyroid  nodule with your primary care provider upon follow-up.  Discharge Diagnoses: Principal Problem:   Intractable back pain Active Problems:   Essential hypertension   Open supracondylar fracture of left humerus   Nicotine dependence, cigarettes, uncomplicated   Mixed hyperlipidemia   Left knee pain   Right thyroid  nodule  Resolved Problems:   * No resolved hospital problems. [facility] Course: 58 y.o.female with a history of PMH of CVA with residual mild left-sided weakness (2021), nicotine dependence, coronary artery disease status post NSTEMI in 2017, hypertension, hyperlipidemia presenting to [facility] with  complaints of severe left arm pain and upper back pain.  Upon evaluation in the emergency department CT angiogram of the chest was performed as well as dedicated CT imaging of the left shoulder head and neck all of which were unremarkable.  Patient received several doses of intravenous opiate-based analgesics due to her pain with no improvement in symptoms.  Due to patient's continued intractable back and shoulder pain the hospitalist group was then called to assess the patient for admission to hospital.  After a thorough evaluation with numerous radiographic images revealing no additional injuries, patient was felt to be suffering from pain that was radiating from her known distal humerus fracture.  Patient was managed with a combination of Lidoderm  patches, as needed naproxen , as needed Robaxin  and as needed oxycodone  for severe pain.  PT evaluation was also obtained.  The days that followed patient's symptoms gradually improved resulting in patient being able to ambulate safely.  Patient was discharged home in improved and stable condition on 12/09/2023 and was instructed to follow closely with her orthopedic surgeon Dr. Kendal.    Pain control - [place]  Controlled Substance Reporting System database was reviewed. and patient was instructed, not to drive, operate heavy machinery, perform activities at heights, swimming or participation in water activities or provide baby-sitting services while on Pain, Sleep and Anxiety Medications; until their outpatient Physician has advised to do so again. Also recommended to not to take more than prescribed Pain, Sleep and Anxiety Medications.   Consultants: none Procedures performed: none  Disposition: Home Diet recommendation:  Discharge Diet Orders (From admission, onward)     Start     Ordered   12/09/23 0000  Diet - low sodium heart  healthy        12/09/23 0938           Cardiac diet  DISCHARGE MEDICATION: Allergies as of 12/09/2023    No Known Allergies      Medication List     STOP taking these medications    ibuprofen 800 MG tablet Commonly known as: ADVIL   oxyCODONE -acetaminophen  5-325 MG tablet Commonly known as: Percocet       TAKE these medications    albuterol  108 (90 Base) MCG/ACT inhaler Commonly known as: VENTOLIN  HFA Inhale 2 puffs into the lungs every 2 (two) hours as needed for wheezing or shortness of breath.   amLODipine  10 MG tablet Commonly known as: NORVASC  TAKE 1 TABLET BY MOUTH EVERY DAY What changed: when to take this   aspirin  EC 81 MG tablet Take 81 mg by mouth in the morning. Swallow whole.   atorvastatin  80 MG tablet Commonly known as: LIPITOR  Take 1 tablet (80 mg total) by mouth at bedtime.   Baclofen  5 MG Tabs Take 5 mg by mouth at bedtime.   BIOFREEZE EX Apply 1 Application topically as needed.   carvedilol  25 MG tablet Commonly known as: COREG  Take 1 tablet (25 mg total) by mouth 2 (two) times daily.   cyanocobalamin 2000 MCG tablet Take 2,000 mcg by mouth in the morning.   dorzolamide -timolol  2-0.5 % ophthalmic solution Commonly known as: COSOPT  Place 1 drop into both eyes 2 (two) times daily.   DULoxetine  30 MG capsule Commonly known as: Cymbalta  Take 1 capsule (30 mg total) by mouth at bedtime.   famotidine  20 MG tablet Commonly known as: PEPCID  Take 20 mg by mouth at bedtime.   lidocaine  5 % Commonly known as: LIDODERM  Place 1 patch onto the skin daily. Remove & Discard patch within 12 hours or as directed by MD   Methocarbamol  1000 MG Tabs Take 1,000 mg by mouth every 8 (eight) hours as needed for muscle spasms.   naproxen  500 MG tablet Commonly known as: Naprosyn  Take 1 tablet (500 mg total) by mouth 2 (two) times daily as needed for mild pain (pain score 1-3) or moderate pain (pain score 4-6).   Netarsudil  Dimesylate 0.02 % Soln Place 1 drop into both eyes at bedtime. Rhopressa   oxyCODONE  5 MG immediate release tablet Commonly known  as: Oxy IR/ROXICODONE  Take 1 tablet (5 mg total) by mouth every 4 (four) hours as needed for severe pain (pain score 7-10).   pregabalin  200 MG capsule Commonly known as: LYRICA  Take 200 mg by mouth at bedtime.   pregabalin  150 MG capsule Commonly known as: LYRICA  Take 150 mg by mouth in the morning.   Vitamin D3 50 MCG (2000 UT) capsule Take 2,000 Units by mouth every morning.        Follow-up Information     Haddix, Franky SQUIBB, MD. Schedule an appointment as soon as possible for a visit.   Specialty: Orthopedic Surgery Contact information: [address] [place] [address] [phone]         Orthopedic Rehabilitation at [facility]. Schedule an appointment as soon as possible for a visit.   Contact information: [phone]  [address] [place], [place] [address]                Discharge Exam: Fredricka Weights   12/07/23 1820 12/08/23 0429 12/09/23 0518  Weight: 85.4 kg 88.6 kg 85.8 kg    Constitutional: Awake alert and oriented x3, no associated distress.  Respiratory: clear to auscultation bilaterally, no wheezing, no crackles. Normal respiratory effort. No accessory muscle use.  Cardiovascular: Regular rate and rhythm, no murmurs / rubs / gallops. No extremity edema. 2+ pedal pulses. No carotid bruits.  Abdomen: Abdomen is soft and nontender.  No evidence of intra-abdominal masses.  Positive bowel sounds noted in all quadrants.   Musculoskeletal: Severe pain with both passive and active range of motion of the left upper extremity.    Condition at discharge: fair  The results of significant diagnostics from this hospitalization (including imaging, microbiology, ancillary and laboratory) are listed below for reference.   Imaging Studies: DG Lumbar Spine Complete Result Date: 12/07/2023 CLINICAL DATA:  Status post fall. Admission for pain related to acute upper back discomfort. EXAM: LUMBAR SPINE - COMPLETE 4+ VIEW COMPARISON:  02/22/2021  FINDINGS: Slight leftward curvature of the lumbar spine centered around the L3-4 level. The alignment of the lumbar spine is otherwise normal. The lumbar vertebral body heights are well preserved. Facet joints appear aligned. No signs of acute fracture or subluxation. Disc space narrowing and endplate spurring is identified at L3-4 and L4-5. Advanced aortic atherosclerotic calcifications. IMPRESSION: 1. No acute findings. 2. L3-4 and L4-5 degenerative disc disease. 3. Mild left convex scoliosis. Electronically Signed   By: Waddell Calk M.D.   On: 12/07/2023 11:28   DG Knee Complete 4 Views Left Result Date: 12/07/2023 CLINICAL DATA:  Pain after fall EXAM: LEFT KNEE - COMPLETE 4 VIEW COMPARISON:  X-ray 02/17/2021 FINDINGS: Osteopenia. No fracture or dislocation. No joint effusion on lateral view. Vascular calcifications are seen posterior to the knee. IMPRESSION: Osteopenia.  No acute osseous abnormality. Electronically Signed   By: Ranell Bring M.D.   On: 12/07/2023 11:13   CT Shoulder Left Wo Contrast Result Date: 12/07/2023 CLINICAL DATA:  Worsening left shoulder pain, on 11/22/2023 server comminuted distal humeral fracture. Pain is increasing in the left shoulder which originally did not show evidence of fractures. EXAM: CT OF THE LEFT SHOULDER WITHOUT CONTRAST TECHNIQUE: Multidetector CT imaging of the left shoulder was performed according to the standard protocol. RADIATION DOSE REDUCTION: This exam was performed according to the departmental dose-optimization program which includes automated exposure control, adjustment of the mA and/or kV according to patient size and/or use of iterative reconstruction technique. COMPARISON:  Left humeral CT 11/22/2023. FINDINGS: Bones/Joint/Cartilage There is no evidence of fracture or dislocation with normal alignment at the acromioclavicular and glenohumeral joints, trace inferior spurring at the glenohumeral joint. Trace spurring at the [facility] joint. No joint effusion  is seen. Ligaments Suboptimally assessed by CT. Muscles and Tendons Also not well seen with CT. There is a grossly normal muscle bulk. No intramuscular hematoma is seen. The rotator cuff tendons are not optimally assessed for complete intactness but they are all present and all demonstrate at least partially intact insertions. The bicipital tendon is normally located. The deltoid muscle is unremarkable. Soft tissues There is patchy aortic atherosclerosis. Otherwise negative soft tissues. No hematoma or mass. IMPRESSION: 1. No evidence of fracture or dislocation. 2. Trace degenerative change at the glenohumeral and AC joints. 3. Aortic atherosclerosis. 4. The rotator cuff tendons are not optimally assessed for complete intactness but they are all present and all demonstrate at least partially intact insertions. Aortic Atherosclerosis (ICD10-I70.0). Electronically Signed   By: Francis Quam M.D.   On: 12/07/2023 06:25   CT Angio Chest PE W and/or Wo Contrast Result Date: 12/07/2023 CLINICAL DATA:  Positive D-dimer and dizziness following a comminuted distal  left humeral fracture 11/22/2023. Increasing difficulty with mobility. EXAM: CT ANGIOGRAPHY CHEST WITH CONTRAST TECHNIQUE: Multidetector CT imaging of the chest was performed using the standard protocol during bolus administration of intravenous contrast. Multiplanar CT image reconstructions and MIPs were obtained to evaluate the vascular anatomy. RADIATION DOSE REDUCTION: This exam was performed according to the departmental dose-optimization program which includes automated exposure control, adjustment of the mA and/or kV according to patient size and/or use of iterative reconstruction technique. CONTRAST:  OMNIPAQUE  IOHEXOL  350 MG/ML SOLN COMPARISON:  Last chest x-ray was portable chest 11/22/2023. Before this was low-dose lung cancer screening chest CT 10/31/2023. FINDINGS: Cardiovascular: The cardiac size is normal. There are patchy three-vessel  coronary artery calcifications. No significant pericardial fluid. The pulmonary trunk is enlarged, 3.2 cm compatible with arterial hypertension. Previously 2.5 cm. Pulmonary arterial opacification is diagnostic. No arterial embolism is seen. Pulmonary veins are nondistended. There is moderate atherosclerosis of the aorta and great vessels without aneurysm, stenosis or dissection. Mediastinum/Nodes: There is a 2.1 cm solid hypodense right thyroid  nodule, most likely was present on 10/31/2023 but not seen due to low-dose technique. Nonemergent follow-up thyroid  ultrasound is recommended. The left lobe, both axillary spaces, thoracic trachea, main bronchi and thoracic esophagus are unremarkable. There is no intrathoracic adenopathy. Lungs/Pleura: There is mild centrilobular emphysema. Increased linear atelectasis in the right lower lobe. Diffuse bronchial thickening. There is increased hazy opacity in the posterolateral right lower lobe base, which could be atelectasis or pneumonitis. There is faint mosaicism in the upper and lower lobes most likely due to air trapping with small airways disease. No other focal infiltrate is seen. No pulmonary nodule. There is no pleural effusion, thickening or pneumothorax. Upper Abdomen: No acute abnormality. Musculoskeletal: Degenerative disc disease and spondylosis thoracic spine. No acute or significant osseous findings. No mass of the visualized chest wall. Review of the MIP images confirms the above findings. IMPRESSION: 1. No evidence of arterial embolus. 2. Enlarged pulmonary trunk 3.2 cm compatible with arterial hypertension. 3. Aortic and coronary artery atherosclerosis. 4. Emphysema and bronchitis. 5. Increased hazy opacity in the posterolateral right lower lobe base which could be atelectasis or pneumonitis. 6. Faint mosaicism in the upper and lower lobes. 7. 2.1 cm solid hypodense right thyroid  nodule. Nonemergent follow-up thyroid  ultrasound recommended. Aortic  Atherosclerosis (ICD10-I70.0) and Emphysema (ICD10-J43.9). Electronically Signed   By: Francis Quam M.D.   On: 12/07/2023 06:04   CT ANGIO HEAD NECK W WO CM Result Date: 12/07/2023 EXAM: CT HEAD WITHOUT CTA HEAD AND NECK WITH AND WITHOUT 12/07/2023 05:40:17 AM TECHNIQUE: CTA of the head and neck was performed with and without the administration of intravenous contrast. Noncontrast CT of the head with reconstructed 2-D images are also provided for review. Multiplanar 2D and/or 3D reformatted images are provided for review. Automated exposure control, iterative reconstruction, and/or weight based adjustment of the mA/kV was utilized to reduce the radiation dose to as low as reasonably achievable. COMPARISON: CT head without contrast 04/26/2022. MR head without contrast 07/05/2020. CLINICAL HISTORY: Neuro deficit, acute, stroke suspected. Dizziness and arm pain and headache. Pt had fall and Fx L arm last week. Having increasing difficulty with mobility. Prior stroke with L side deficit. FINDINGS: CT HEAD: BRAIN AND VENTRICLES: No acute intracranial hemorrhage. No mass effect or midline shift. No extra-axial fluid collection. Gray-white differentiation is maintained. No hydrocephalus. Remote white matter infarcts are present in the posterior corona radiata bilaterally. ORBITS: Bilateral lens replacements are noted. The globes and orbits are otherwise within  normal limits. SINUSES: No acute abnormality. SOFT TISSUES AND SKULL: No acute abnormality. CTA NECK: AORTIC ARCH AND ARCH VESSELS: Atherosclerotic changes are present at the aortic arch and great vessel origins without focal stenosis or aneurysm. No dissection is present. CERVICAL CAROTID ARTERIES: Minimal mural calcifications are present in the right common carotid artery. Calcifications are present at the right carotid bifurcation without focal stenosis. Atherosclerotic changes are present at the proximal left ICA without focal stenosis relative to the more  distal vessel. CERVICAL VERTEBRAL ARTERIES: No dissection, arterial injury, or significant stenosis. VISUALIZED LUNGS AND MEDIASTINUM: Unremarkable. SOFT TISSUES: A 2.2 cm nodule is incidentally noted within the right lobe of the thyroid . BONES: Straightening of the normal cervical lordosis is present. Mild uncovertebral spurring is present bilaterally at C5-6 and C6-7. No focal osseous lesions are present. CTA HEAD: ANTERIOR CIRCULATION: Atherosclerotic calcifications are present in the cavernous and supraclinoid internal carotid arteries bilaterally without focal stenosis. POSTERIOR CIRCULATION: The left vertebral artery originates from the basilar tip. A fetal-type right posterior cerebral artery is present. OTHER: No dural venous sinus thrombosis on this non-dedicated study. IMPRESSION: 1. No acute intracranial hemorrhage. 2. No large vessel occlusion, hemodynamically significant stenosis, or aneurysm in the head or neck. 3. Remote white matter infarcts are present in the posterior corona radiata bilaterally, consistent with prior stroke. 4. Atherosclerotic changes are present in the aortic arch, great vessel origins, and cervical carotid arteries without focal stenosis or aneurysm. 5. Incidental 2.2 cm nodule in the right lobe of the thyroid . Electronically signed by: Lonni Necessary MD 12/07/2023 05:55 AM EDT RP Workstation: [id number]   DG Humerus Left Result Date: 12/06/2023 CLINICAL DATA:  Postoperative pain. EXAM: LEFT HUMERUS - 2+ VIEW COMPARISON:  Left elbow x-ray 11/23/2023. FINDINGS: There is a new overlying cast/splint obscuring fine detail. Plates and screws are seen in the distal humerus. Fracture lines are not well seen secondary to overlying external artifact. Alignment is grossly unchanged. No evidence for dislocation. IMPRESSION: 1. New overlying cast/splint obscuring fine detail. 2. Plates and screws in the distal humerus. Fracture lines are not well seen secondary to overlying external  artifact. Electronically Signed   By: Greig Pique M.D.   On: 12/06/2023 22:40   DG Elbow 2 Views Left Result Date: 11/23/2023 CLINICAL DATA:  Elbow fracture. EXAM: LEFT ELBOW - 2 VIEW COMPARISON:  CT left femur 11/22/2023 FINDINGS: Interval postsurgical changes of distal medial and lateral humeral plate and screw fixation. There is improved alignment of the comminuted supracondylar fracture of the distal humerus seen previously. Normal alignment at the radiocapitellar and trochlear-ulnar articulations. There is overlying cast material. IMPRESSION: Interval postsurgical changes of distal humeral plate and screw fixation with improved alignment of the comminuted supracondylar fracture of the distal humerus. Electronically Signed   By: Tanda Lyons M.D.   On: 11/23/2023 15:53   DG ELBOW COMPLETE LEFT (3+VIEW) Result Date: 11/23/2023 CLINICAL DATA:  Open reduction internal fixation of left elbow. Intraoperative fluoroscopy. EXAM: LEFT ELBOW - COMPLETE 3+ VIEW COMPARISON:  Left elbow radiographs 11/22/2023, CT left humerus 11/22/2023 FINDINGS: Images were performed intraoperatively without the presence of a radiologist. The patient is undergoing medial and lateral distal humeral malleable plate and screw fixation of the previously seen displaced supracondylar fracture. Improved alignment. No hardware complication is seen. Total fluoroscopy images: 4 Total fluoroscopy time: 21 seconds Total dose: Radiation Exposure Index (as provided by the fluoroscopic device): 0.70 mGy air Kerma Please see intraoperative findings for further detail. IMPRESSION: Intraoperative fluoroscopy during open reduction  internal fixation of the previously seen displaced supracondylar fracture. Electronically Signed   By: Tanda Lyons M.D.   On: 11/23/2023 14:12   DG C-Arm 1-60 Min-No Report Result Date: 11/23/2023 Fluoroscopy was utilized by the requesting physician.  No radiographic interpretation.   DG C-Arm 1-60 Min-No  Report Result Date: 11/23/2023 Fluoroscopy was utilized by the requesting physician.  No radiographic interpretation.   CT HUMERUS LEFT WO CONTRAST Result Date: 11/22/2023 CLINICAL DATA:  Fall. EXAM: CT OF THE UPPER LEFT EXTREMITY WITHOUT CONTRAST TECHNIQUE: Multidetector CT imaging of the upper left extremity was performed according to the standard protocol. RADIATION DOSE REDUCTION: This exam was performed according to the departmental dose-optimization program which includes automated exposure control, adjustment of the mA and/or kV according to patient size and/or use of iterative reconstruction technique. COMPARISON:  Same day radiographs of the left humerus and elbow dated 11/22/2023. FINDINGS: Bones/Joint/Cartilage Acute comminuted supracondylar fracture of the distal humerus extending from the medial distal supracondylar humeral diaphysis obliquely and distally through the lateral supracondylar distal humeral metaphysis (series 8, images 53 and 54). There is approximately 15 mm of medial displacement along the medial supracondylar fracture margin and 6 mm of posterior displacement at the lateral supracondylar fracture margin (series 3, images 117 and 120). The fracture margin extends through the olecranon fossa. No additional fracture identified. The visualized proximal radius and ulna appear intact. The radiocapitellar and ulnotrochlear joints appear anatomically aligned. Glenohumeral and acromioclavicular joints are anatomically aligned. Ligaments Ligaments are suboptimally evaluated by CT. Muscles and Tendons Muscles are normal. No muscle atrophy. No intramuscular fluid collection or hematoma. Soft tissue Overlying soft tissue splint in place. Soft tissue surrounding the supracondylar distal humeral fracture described above. No fluid collection or hematoma. No soft tissue mass. IMPRESSION: Acute comminuted and displaced oblique supracondylar fracture of the left distal humerus, as above.  Electronically Signed   By: Harrietta Sherry M.D.   On: 11/22/2023 13:42   DG Chest Port 1 View Result Date: 11/22/2023 CLINICAL DATA:  Fall. EXAM: PORTABLE CHEST 1 VIEW COMPARISON:  July 30, 2023 FINDINGS: The heart size and mediastinal contours are within normal limits. Elevated left hemidiaphragm is noted. Both lungs are clear. The visualized skeletal structures are unremarkable. IMPRESSION: No active disease. Electronically Signed   By: Lynwood Landy Raddle M.D.   On: 11/22/2023 09:43   DG Humerus Left Result Date: 11/22/2023 CLINICAL DATA:  Fall. EXAM: LEFT HUMERUS - 2+ VIEW COMPARISON:  None Available. FINDINGS: Severely displaced and comminuted fracture is seen involving the supracondylar portion of the distal left humerus. IMPRESSION: Severely displaced and comminuted fracture involving supracondylar portion of distal left humerus. Electronically Signed   By: Lynwood Landy Raddle M.D.   On: 11/22/2023 09:42   DG Elbow Complete Left Result Date: 11/22/2023 CLINICAL DATA:  Fall. EXAM: LEFT ELBOW - COMPLETE 3+ VIEW COMPARISON:  None Available. FINDINGS: Severely displaced and comminuted fracture is seen involving the supracondylar portion of distal humerus. Visualized portions of proximal radius and ulna are unremarkable. IMPRESSION: Severely displaced and comminuted fracture involving supracondylar portion of distal left humerus. Electronically Signed   By: Lynwood Landy Raddle M.D.   On: 11/22/2023 09:41   DG Shoulder Left Port Result Date: 11/22/2023 CLINICAL DATA:  Left shoulder pain after fall. EXAM: LEFT SHOULDER COMPARISON:  February 17, 2021. FINDINGS: Single view only was obtained. There is no evidence of fracture or dislocation. There is no evidence of arthropathy or other focal bone abnormality. Soft tissues are unremarkable. IMPRESSION: Negative.  No definite abnormality seen involving left shoulder on this single projection. Electronically Signed   By: Lynwood Landy Raddle M.D.   On: 11/22/2023 09:40     Microbiology: Results for orders placed or performed during the hospital encounter of 11/22/23  Surgical pcr screen     Status: None   Collection Time: 11/22/23  7:49 PM   Specimen: Nasal Mucosa; Nasal Swab  Result Value Ref Range Status   MRSA, PCR NEGATIVE NEGATIVE Final   Staphylococcus aureus NEGATIVE NEGATIVE Final    Comment: (NOTE) The Xpert SA Assay (FDA approved for NASAL specimens in patients 20 years of age and older), is one component of a comprehensive surveillance program. It is not intended to diagnose infection nor to guide or monitor treatment. Performed at [facility], [address], [place], [place] [address]     Labs: CBC: Recent Labs  Lab 12/06/23 2056 12/08/23 0738 12/09/23 0554  WBC 10.5 8.0 7.6  NEUTROABS 7.7 4.8 4.2  HGB 14.1 13.5 13.0  HCT 41.6 42.0 40.6  MCV 86.3 91.1 89.8  PLT 425* 412* 378   Basic Metabolic Panel: Recent Labs  Lab 12/06/23 2056 12/08/23 0738 12/09/23 0554  NA 141 138 137  K 3.9 3.1* 3.4*  CL 103 100 102  CO2 21* 25 23  GLUCOSE 87 139* 117*  BUN 8 16 14   CREATININE 0.76 0.83 0.89  CALCIUM  9.4 9.3 8.9  MG  --  2.2 2.1   Liver Function Tests: Recent Labs  Lab 12/06/23 2056 12/08/23 0738 12/09/23 0554  AST 25 19 20   ALT 21 19 19   ALKPHOS 93 94 94  BILITOT 1.4* 0.7 0.6  PROT 8.5* 7.8 7.3  ALBUMIN  4.1 3.9 3.8   CBG: No results for input(s): GLUCAP in the last 168 hours.  Discharge time spent: greater than 30 minutes.  Signed: Zachary JINNY Ba, MD [facility] 12/10/2023

## 2024-01-03 ENCOUNTER — Institutional Professional Consult (permissible substitution) (INDEPENDENT_AMBULATORY_CARE_PROVIDER_SITE_OTHER): Admitting: Otolaryngology

## 2024-01-28 ENCOUNTER — Ambulatory Visit: Payer: 59 | Admitting: Neurology

## 2024-03-18 ENCOUNTER — Other Ambulatory Visit: Payer: Self-pay | Admitting: Neurology

## 2024-03-18 ENCOUNTER — Telehealth: Payer: Self-pay | Admitting: Neurology

## 2024-03-18 MED ORDER — DULOXETINE HCL 60 MG PO CPEP: 60.0000 mg | ORAL_CAPSULE | Freq: Every day | ORAL | 3 refills | Status: AC

## 2024-03-18 NOTE — Telephone Encounter (Signed)
 Burning sensation in face and effecting eye sight on left side, shoulder stinging a get stiff. Would like a call back to discuss getting a sooner appointment. Informed patient nurse will call back if have sooner appointment.

## 2024-03-18 NOTE — Telephone Encounter (Signed)
 Call to patient, she reports increased burning sensation in her face radiating toward her left eye, she report stiffness on left side. She reports the cymbalta  has helped some but feels she may need it increased. She is taking  Cymbalta  30mg  daily .She denies any new stroke symptoms. She does report falling back in June and breaking her arm. She also reports feeling a knot in the back of her head. She also deneis any chest pain. Advised I would send to Dr. Gregg to review. Has an upcoming appointment in November but says she can't wait that long for relief.

## 2024-03-18 NOTE — Telephone Encounter (Signed)
 Please call and advise patient to increase Cymbalta  to 60 mg. For now take 2 tablets until you get the new prescription.

## 2024-03-19 NOTE — Telephone Encounter (Signed)
 Call to patient, she is agreeable to plan and medication increase.

## 2024-04-23 ENCOUNTER — Ambulatory Visit (INDEPENDENT_AMBULATORY_CARE_PROVIDER_SITE_OTHER): Admitting: Neurology

## 2024-04-23 ENCOUNTER — Telehealth: Payer: Self-pay | Admitting: Neurology

## 2024-04-23 ENCOUNTER — Encounter: Payer: Self-pay | Admitting: Neurology

## 2024-04-23 VITALS — BP 131/88 | HR 92 | Ht 65.0 in | Wt 188.0 lb

## 2024-04-23 DIAGNOSIS — G890 Central pain syndrome: Secondary | ICD-10-CM | POA: Diagnosis not present

## 2024-04-23 DIAGNOSIS — R269 Unspecified abnormalities of gait and mobility: Secondary | ICD-10-CM

## 2024-04-23 DIAGNOSIS — R278 Other lack of coordination: Secondary | ICD-10-CM | POA: Diagnosis not present

## 2024-04-23 DIAGNOSIS — I6389 Other cerebral infarction: Secondary | ICD-10-CM

## 2024-04-23 DIAGNOSIS — I69354 Hemiplegia and hemiparesis following cerebral infarction affecting left non-dominant side: Secondary | ICD-10-CM | POA: Diagnosis not present

## 2024-04-23 NOTE — Progress Notes (Signed)
 [facility]  PATIENT: Shirley Hansen DOB: 1965-12-12  REQUESTING CLINICIAN: Campbell Reynolds, NP HISTORY FROM: Patient  REASON FOR VISIT: Stroke   HISTORICAL  CHIEF COMPLAINT:  Chief Complaint  Patient presents with   Follow-up    Pt in room 12.alone. Here for CVA follow up.   INTERVAL HISTORY 04/23/2024 Patient presents today for follow-up, she is alone.  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 Unfortunately in June she did fall and broke her humerus.  She had surgery and currently undergoing PT.   In August patient was admitted for pneumonia.  After discharge from the hospital she noted difficulty using her left arm.  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    INTERVAL HISTORY 07/17/2023 Patient presents today for follow up, last visit was in November 2022. Since then, she has been ok. 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 She has been doing physical therapy with some good results. Today, she tells me that her main issue is her left side burning pain. It starts from the left side of face, then left side of body including arm and leg. The pain is intermittent, describes it as burning pain, sometimes, she also complaints of temperature changes on the left side of body.  She still has gait abnormalities, uses a cane or walker at home but stated that she did better when she was using a hemi walker.   HISTORY OF PRESENT ILLNESS:  This is a 58 year old woman with past medical history of stroke with left-sided residual deficit in 2020, hypertension, hyperlipidemia and muscle spasm who is presenting to establish care for her history of strokes.  Patient stated stroke happened in 2020, etiology of the stroke was hypertensive bleed, she had bleeding in the right coronary radiata/thalamus.  Since then, she  does have chronic left-sided weakness.  She reported on September 3 she fell and had difficulty getting up since then she has worsening shoulder pain, she set up to have a shoulder MRI done by orthopedics.  She does also have history of muscle spasm, has tried Robaxin , Flexeril , gabapentin  and at on Oct 28, she presented to the ED with hallucination felt to be secondary to her muscle relaxants, mostly Flexeril  which was discontinued.  During this time also she has been completing physical therapy, she is finished last week.  Patient said that she is able to walk with a walker no recent falls.  She is compliant with the medication.   OTHER MEDICAL CONDITIONS: Stroke with left sided deficit in 2020, HTN, HLD, muscle spasms    REVIEW OF SYSTEMS: Full 14 system review of systems performed and negative with exception of: as noted in the HPI   ALLERGIES: No Known Allergies  HOME MEDICATIONS: Outpatient Medications Prior to Visit  Medication Sig Dispense Refill   albuterol  (VENTOLIN  HFA) 108 (90 Base) MCG/ACT inhaler Inhale 2 puffs into the lungs every 2 (two) hours as needed for wheezing or shortness of breath.     amLODipine  (NORVASC ) 10 MG tablet TAKE 1 TABLET BY MOUTH EVERY DAY 90 tablet 0   aspirin  EC 81 MG tablet Take 81 mg by mouth in the morning. Swallow whole.     atorvastatin  (LIPITOR ) 80 MG tablet Take 1 tablet (80 mg total) by mouth at bedtime. 90 tablet 1   carvedilol  (COREG ) 25 MG  tablet Take 1 tablet (25 mg total) by mouth 2 (two) times daily. 180 tablet 1   Cholecalciferol  (VITAMIN D3) 50 MCG (2000 UT) capsule Take 2,000 Units by mouth every morning.     dorzolamide -timolol  (COSOPT ) 22.3-6.8 MG/ML ophthalmic solution Place 1 drop into both eyes 2 (two) times daily.     DULoxetine  (CYMBALTA ) 60 MG capsule Take 1 capsule (60 mg total) by mouth at bedtime. 90 capsule 3   famotidine  (PEPCID ) 20 MG tablet Take 20 mg by mouth at bedtime.     lidocaine  (LIDODERM ) 5 % Place 1 patch onto the  skin daily. Remove & Discard patch within 12 hours or as directed by MD 30 patch 2   Menthol , Topical Analgesic, (BIOFREEZE EX) Apply 1 Application topically as needed.     naproxen  (NAPROSYN ) 500 MG tablet Take 1 tablet (500 mg total) by mouth 2 (two) times daily as needed for mild pain (pain score 1-3) or moderate pain (pain score 4-6). 60 tablet 2   Netarsudil  Dimesylate 0.02 % SOLN Place 1 drop into both eyes at bedtime. Rhopressa     pregabalin  (LYRICA ) 150 MG capsule Take 150 mg by mouth in the morning.     pregabalin  (LYRICA ) 200 MG capsule Take 200 mg by mouth at bedtime.     Baclofen  5 MG TABS Take 5 mg by mouth at bedtime. (Patient not taking: Reported on 04/23/2024)     cyanocobalamin  2000 MCG tablet Take 2,000 mcg by mouth in the morning. (Patient not taking: Reported on 04/23/2024)     cyclobenzaprine  (FLEXERIL ) 10 MG tablet Take 10 mg by mouth every 8 (eight) hours as needed for muscle spasms.     methocarbamol  1000 MG TABS Take 1,000 mg by mouth every 8 (eight) hours as needed for muscle spasms. (Patient not taking: Reported on 04/23/2024) 30 tablet 0   oxyCODONE  (OXY IR/ROXICODONE ) 5 MG immediate release tablet Take 1 tablet (5 mg total) by mouth every 4 (four) hours as needed for severe pain (pain score 7-10). (Patient not taking: Reported on 04/23/2024) 30 tablet 0   No facility-administered medications prior to visit.    PAST MEDICAL HISTORY: Past Medical History:  Diagnosis Date   Hypertension    Pancreatitis    Stage 3a chronic kidney disease (HCC)    Stroke (HCC)     PAST SURGICAL HISTORY: Past Surgical History:  Procedure Laterality Date   cataract surgery Left 04/05/2023   OD   CORONARY ANGIOPLASTY WITH STENT PLACEMENT     ORIF HUMERUS FRACTURE Left 11/23/2023   Procedure: OPEN REDUCTION INTERNAL FIXATION (ORIF) DISTAL HUMERUS FRACTURE;  Surgeon: Kendal Franky SQUIBB, MD;  Location: [facility] OR;  Service: Orthopedics;  Laterality: Left;   TUBAL LIGATION      FAMILY  HISTORY: Family History  Problem Relation Age of Onset   Lung cancer Mother    Prostate cancer Father    Congestive Heart Failure Father    Colon cancer Neg Hx    Colon polyps Neg Hx    Esophageal cancer Neg Hx    Pancreatic cancer Neg Hx    Stomach cancer Neg Hx     SOCIAL HISTORY: Social History   Socioeconomic History   Marital status: Single    Spouse name: Not on file   Number of children: Not on file   Years of education: Not on file   Highest education level: Not on file  Occupational History   Not on file  Tobacco Use   Smoking status:  Former    Types: Cigarettes   Smokeless tobacco: Never  Vaping Use   Vaping status: Never Used  Substance and Sexual Activity   Alcohol  use: Not Currently   Drug use: Never   Sexual activity: Not on file  Other Topics Concern   Not on file  Social History Narrative   Not on file   Social Drivers of Health   Financial Resource Strain: Low Risk  (08/26/2022)   Received from [facility]   Overall Financial Resource Strain (CARDIA)    Difficulty of Paying Living Expenses: Not hard at all  Food Insecurity: No Food Insecurity (12/08/2023)   Hunger Vital Sign    Worried About Running Out of Food in the Last Year: Never true    Ran Out of Food in the Last Year: Never true  Transportation Needs: No Transportation Needs (01/15/2024)   Received from [facility] - Transportation    In the past 12 months, has lack of transportation kept you from medical appointments or from getting medications?: No    Lack of Transportation (Non-Medical): No  Physical Activity: Inactive (10/06/2020)   Received from [facility]   Exercise Vital Sign    On average, how many days per week do you engage in moderate to strenuous exercise (like a brisk walk)?: 0 days    On average, how many minutes do you engage in exercise at this level?: 0 min  Stress: No Stress Concern Present (10/06/2020)    Received from [facility] - Occupational Stress Questionnaire    Feeling of Stress : Only a little  Social Connections: Moderately Isolated (10/06/2020)   Received from [facility]   Social Connection and Isolation Panel    In a typical week, how many times do you talk on the phone with family, friends, or neighbors?: More than three times a week    How often do you get together with friends or relatives?: Three times a week    How often do you attend church or religious services?: More than 4 times per year    Do you belong to any clubs or organizations such as church groups, unions, fraternal or athletic groups, or school groups?: No    How often do you attend meetings of the clubs or organizations you belong to?: Never    Are you married, widowed, divorced, separated, never married, or living with a partner?: Divorced  Intimate Partner Violence: Not At Risk (12/08/2023)   Humiliation, Afraid, Rape, and Kick questionnaire    Fear of Current or Ex-Partner: No    Emotionally Abused: No    Physically Abused: No    Sexually Abused: No     PHYSICAL EXAM  GENERAL EXAM/CONSTITUTIONAL: Vitals:  Vitals:   04/23/24 1106  BP: 131/88  Pulse: 92  Weight: 188 lb (85.3 kg)  Height: 5' 5 (1.651 m)    Body mass index is 31.28 kg/m. Wt Readings from Last 3 Encounters:  04/23/24 188 lb (85.3 kg)  12/09/23 189 lb 2.5 oz (85.8 kg)  11/23/23 191 lb (86.6 kg)   Patient is in no distress; well developed, nourished and groomed; neck is supple  MUSCULOSKELETAL: Gait, strength, tone, movements noted in Neurologic exam below  NEUROLOGIC: MENTAL STATUS:      No data to display         awake, alert, oriented to person, place and time recent  and remote memory intact normal attention and concentration language fluent, comprehension intact, naming intact fund of knowledge appropriate  CRANIAL NERVE:  2nd, 3rd,  4th, 6th - pupils equal and reactive to light, visual fields full to confrontation, extraocular muscles intact, no nystagmus 5th - facial sensation symmetric 7th - facial strength symmetric 8th - hearing intact 9th - palate elevates symmetrically, uvula midline 11th - shoulder shrug symmetric 12th - tongue protrusion midline  MOTOR:  normal bulk and tone, full strength in the right UE, and LE. There is weakness 4/5 in the LUE and LLE.   Report normal sensation throughout   COORDINATION:  finger-nose-finger is normal with the right and worsening dysmetria on the left.     GAIT/STATION:  Uses a wheelchair today, but able to stand without assistance.     DIAGNOSTIC DATA (LABS, IMAGING, TESTING) - I reviewed patient records, labs, notes, testing and imaging myself where available.  Lab Results  Component Value Date   WBC 7.6 12/09/2023   HGB 13.0 12/09/2023   HCT 40.6 12/09/2023   MCV 89.8 12/09/2023   PLT 378 12/09/2023      Component Value Date/Time   NA 137 12/09/2023 0554   NA 144 10/17/2021 1603   K 3.4 (L) 12/09/2023 0554   CL 102 12/09/2023 0554   CO2 23 12/09/2023 0554   GLUCOSE 117 (H) 12/09/2023 0554   BUN 14 12/09/2023 0554   BUN 7 10/17/2021 1603   CREATININE 0.89 12/09/2023 0554   CALCIUM  8.9 12/09/2023 0554   PROT 7.3 12/09/2023 0554   PROT 7.6 10/17/2021 1603   ALBUMIN  3.8 12/09/2023 0554   ALBUMIN  4.3 10/17/2021 1603   AST 20 12/09/2023 0554   ALT 19 12/09/2023 0554   ALKPHOS 94 12/09/2023 0554   BILITOT 0.6 12/09/2023 0554   BILITOT 0.4 10/17/2021 1603   GFRNONAA >60 12/09/2023 0554   No results found for: CHOL, HDL, LDLCALC, LDLDIRECT, TRIG, CHOLHDL No results found for: YHAJ8R No results found for: VITAMINB12 Lab Results  Component Value Date   TSH 1.330 04/04/2021    MRI Brain 03/05/2021 No evidence of acute intracranial abnormality. Redemonstrated sequela of remote hemorrhage within the right corona  radiata/thalamus. Redemonstrated chronic small-vessel infarcts within the bilateral corona radiata, left basal ganglia, left thalamus and within the pons. 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 Mild generalized parenchymal atrophy    ASSESSMENT AND PLAN  58 y.o. 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  She still has sequelae from her strokes including mainly left hemiplegia and now worsening dysmetria on the left since admission in August for pneumonia plan.  Plan will be to obtain a routine head CT to rule out any new cerebellar stroke.  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  She is currently on pregabalin  and duloxetine  but still complain of burning pain.  I have advised her to try lidocaine  cream.  She tells me that she is moving to [facility] next week; I will send a referral to one of the [facility] outpatient clinic.  Until she establishes care with them, she understands to contact me for any additional questions or concerns.     1. Cerebrovascular accident (CVA) due to other mechanism (HCC)  2. Cerebellar dysmetria   3. Hemiparesis affecting left side as late effect of cerebrovascular accident (CVA) (HCC)   4. Dejerine Roussy syndrome   5. Gait abnormality     Patient Instructions  Continue current medications for now Consider lidocaine  cream to help with the neuropathic pain Referral to [facility] as a transfer of care as you are moving to [facility] Due to new ataxia, will obtain a head CT to rule out stroke Continue follow-up PCP Return as needed   Orders Placed This Encounter  Procedures   CT HEAD WO  CONTRAST ( )   Ambulatory referral to Neurology    No orders of the defined types were placed in this encounter.   Return if symptoms worsen or fail to improve.   Pastor Falling, MD 04/23/2024, 11:57 AM  [facility] [address] [place], [place] [address] [phone]

## 2024-04-23 NOTE — Patient Instructions (Signed)
 Continue current medications for now Consider lidocaine  cream to help with the neuropathic pain Referral to [facility] as a transfer of care as you are moving to [facility] Due to new ataxia, will obtain a head CT to rule out stroke Continue follow-up PCP Return as needed

## 2024-04-23 NOTE — Telephone Encounter (Signed)
 Referral for neurology fax to [facility]. Phone: [phone], [phone]

## 2024-04-28 ENCOUNTER — Telehealth: Payer: Self-pay | Admitting: Neurology

## 2024-04-28 NOTE — Telephone Encounter (Signed)
 no auth required sent to [facility] [phone]
